# Patient Record
Sex: Female | Born: 1975
Health system: Southern US, Community
[De-identification: ages and names within clinical notes are randomized; demographics above are authoritative.]

## PROBLEM LIST (undated history)

## (undated) ENCOUNTER — Emergency Department (HOSPITAL_BASED_OUTPATIENT_CLINIC_OR_DEPARTMENT_OTHER): Admission: EM | Payer: 59 | Source: Home / Self Care

## (undated) DIAGNOSIS — I10 Essential (primary) hypertension: Secondary | ICD-10-CM

## (undated) DIAGNOSIS — D649 Anemia, unspecified: Secondary | ICD-10-CM

## (undated) DIAGNOSIS — K649 Unspecified hemorrhoids: Secondary | ICD-10-CM

## (undated) DIAGNOSIS — F419 Anxiety disorder, unspecified: Secondary | ICD-10-CM

## (undated) DIAGNOSIS — F32A Depression, unspecified: Secondary | ICD-10-CM

## (undated) DIAGNOSIS — Z5189 Encounter for other specified aftercare: Secondary | ICD-10-CM

## (undated) DIAGNOSIS — G43909 Migraine, unspecified, not intractable, without status migrainosus: Secondary | ICD-10-CM

## (undated) DIAGNOSIS — F329 Major depressive disorder, single episode, unspecified: Secondary | ICD-10-CM

## (undated) DIAGNOSIS — F319 Bipolar disorder, unspecified: Secondary | ICD-10-CM

## (undated) DIAGNOSIS — F1010 Alcohol abuse, uncomplicated: Secondary | ICD-10-CM

## (undated) HISTORY — DX: Major depressive disorder, single episode, unspecified: F32.9

## (undated) HISTORY — PX: HEMORRHOID SURGERY: SHX153

## (undated) HISTORY — DX: Depression, unspecified: F32.A

## (undated) HISTORY — DX: Anxiety disorder, unspecified: F41.9

## (undated) HISTORY — PX: VENTRAL HERNIA REPAIR: SHX424

## (undated) HISTORY — DX: Encounter for other specified aftercare: Z51.89

## (undated) HISTORY — PX: TONSILLECTOMY: SUR1361

## (undated) HISTORY — DX: Bipolar disorder, unspecified: F31.9

## (undated) HISTORY — PX: HERNIA REPAIR: SHX51

## (undated) HISTORY — DX: Anemia, unspecified: D64.9

---

## 1997-07-28 ENCOUNTER — Encounter: Admission: RE | Admit: 1997-07-28 | Discharge: 1997-07-28 | Payer: Self-pay | Admitting: Family Medicine

## 1997-08-11 ENCOUNTER — Encounter: Admission: RE | Admit: 1997-08-11 | Discharge: 1997-08-11 | Payer: Self-pay | Admitting: Family Medicine

## 1997-09-17 ENCOUNTER — Emergency Department (HOSPITAL_COMMUNITY): Admission: EM | Admit: 1997-09-17 | Discharge: 1997-09-17 | Payer: Self-pay | Admitting: Emergency Medicine

## 1998-04-08 ENCOUNTER — Emergency Department (HOSPITAL_COMMUNITY): Admission: EM | Admit: 1998-04-08 | Discharge: 1998-04-08 | Payer: Self-pay | Admitting: Emergency Medicine

## 1998-12-11 ENCOUNTER — Emergency Department (HOSPITAL_COMMUNITY): Admission: EM | Admit: 1998-12-11 | Discharge: 1998-12-11 | Payer: Self-pay | Admitting: Emergency Medicine

## 1999-07-26 ENCOUNTER — Other Ambulatory Visit: Admission: RE | Admit: 1999-07-26 | Discharge: 1999-07-26 | Payer: Self-pay | Admitting: Family Medicine

## 2000-09-26 ENCOUNTER — Other Ambulatory Visit: Admission: RE | Admit: 2000-09-26 | Discharge: 2000-09-26 | Payer: Self-pay | Admitting: Internal Medicine

## 2000-11-28 ENCOUNTER — Encounter: Payer: Self-pay | Admitting: *Deleted

## 2000-11-28 ENCOUNTER — Encounter: Admission: RE | Admit: 2000-11-28 | Discharge: 2000-11-28 | Payer: Self-pay | Admitting: *Deleted

## 2000-12-06 ENCOUNTER — Inpatient Hospital Stay (HOSPITAL_COMMUNITY): Admission: AD | Admit: 2000-12-06 | Discharge: 2000-12-06 | Payer: Self-pay | Admitting: Obstetrics

## 2001-03-04 ENCOUNTER — Encounter: Payer: Self-pay | Admitting: Internal Medicine

## 2001-03-04 ENCOUNTER — Ambulatory Visit (HOSPITAL_COMMUNITY): Admission: RE | Admit: 2001-03-04 | Discharge: 2001-03-04 | Payer: Self-pay | Admitting: Internal Medicine

## 2001-04-13 ENCOUNTER — Inpatient Hospital Stay (HOSPITAL_COMMUNITY): Admission: AD | Admit: 2001-04-13 | Discharge: 2001-04-13 | Payer: Self-pay

## 2001-06-09 ENCOUNTER — Ambulatory Visit (HOSPITAL_COMMUNITY): Admission: RE | Admit: 2001-06-09 | Discharge: 2001-06-09 | Payer: Self-pay

## 2001-07-02 ENCOUNTER — Ambulatory Visit (HOSPITAL_COMMUNITY): Admission: RE | Admit: 2001-07-02 | Discharge: 2001-07-02 | Payer: Self-pay

## 2001-07-15 ENCOUNTER — Ambulatory Visit (HOSPITAL_COMMUNITY): Admission: RE | Admit: 2001-07-15 | Discharge: 2001-07-15 | Payer: Self-pay

## 2001-09-06 ENCOUNTER — Inpatient Hospital Stay (HOSPITAL_COMMUNITY): Admission: AD | Admit: 2001-09-06 | Discharge: 2001-09-06 | Payer: Self-pay

## 2001-10-08 ENCOUNTER — Ambulatory Visit (HOSPITAL_COMMUNITY): Admission: RE | Admit: 2001-10-08 | Discharge: 2001-10-08 | Payer: Self-pay

## 2001-10-16 ENCOUNTER — Inpatient Hospital Stay (HOSPITAL_COMMUNITY): Admission: AD | Admit: 2001-10-16 | Discharge: 2001-10-16 | Payer: Self-pay

## 2001-10-16 ENCOUNTER — Inpatient Hospital Stay (HOSPITAL_COMMUNITY): Admission: AD | Admit: 2001-10-16 | Discharge: 2001-10-18 | Payer: Self-pay

## 2002-03-01 ENCOUNTER — Emergency Department (HOSPITAL_COMMUNITY): Admission: EM | Admit: 2002-03-01 | Discharge: 2002-03-01 | Payer: Self-pay | Admitting: Emergency Medicine

## 2002-03-01 ENCOUNTER — Encounter: Payer: Self-pay | Admitting: Emergency Medicine

## 2002-12-22 ENCOUNTER — Encounter: Payer: Self-pay | Admitting: Emergency Medicine

## 2002-12-22 ENCOUNTER — Emergency Department (HOSPITAL_COMMUNITY): Admission: EM | Admit: 2002-12-22 | Discharge: 2002-12-22 | Payer: Self-pay | Admitting: Emergency Medicine

## 2003-06-03 ENCOUNTER — Emergency Department (HOSPITAL_COMMUNITY): Admission: EM | Admit: 2003-06-03 | Discharge: 2003-06-04 | Payer: Self-pay | Admitting: Emergency Medicine

## 2003-06-16 ENCOUNTER — Encounter: Admission: RE | Admit: 2003-06-16 | Discharge: 2003-06-27 | Payer: Self-pay | Admitting: Occupational Medicine

## 2003-12-15 ENCOUNTER — Emergency Department (HOSPITAL_COMMUNITY): Admission: EM | Admit: 2003-12-15 | Discharge: 2003-12-15 | Payer: Self-pay | Admitting: Emergency Medicine

## 2005-01-18 ENCOUNTER — Inpatient Hospital Stay (HOSPITAL_COMMUNITY): Admission: AD | Admit: 2005-01-18 | Discharge: 2005-01-19 | Payer: Self-pay | Admitting: Obstetrics & Gynecology

## 2005-01-21 ENCOUNTER — Inpatient Hospital Stay (HOSPITAL_COMMUNITY): Admission: AD | Admit: 2005-01-21 | Discharge: 2005-01-21 | Payer: Self-pay | Admitting: Obstetrics and Gynecology

## 2005-05-27 ENCOUNTER — Other Ambulatory Visit: Admission: RE | Admit: 2005-05-27 | Discharge: 2005-05-27 | Payer: Self-pay | Admitting: Obstetrics and Gynecology

## 2005-10-14 ENCOUNTER — Observation Stay (HOSPITAL_COMMUNITY): Admission: AD | Admit: 2005-10-14 | Discharge: 2005-10-15 | Payer: Self-pay | Admitting: Obstetrics and Gynecology

## 2005-12-02 ENCOUNTER — Inpatient Hospital Stay (HOSPITAL_COMMUNITY): Admission: AD | Admit: 2005-12-02 | Discharge: 2005-12-13 | Payer: Self-pay | Admitting: Obstetrics and Gynecology

## 2007-11-17 ENCOUNTER — Emergency Department (HOSPITAL_COMMUNITY): Admission: EM | Admit: 2007-11-17 | Discharge: 2007-11-17 | Payer: Self-pay | Admitting: Emergency Medicine

## 2007-12-16 ENCOUNTER — Emergency Department (HOSPITAL_COMMUNITY): Admission: EM | Admit: 2007-12-16 | Discharge: 2007-12-16 | Payer: Self-pay | Admitting: Emergency Medicine

## 2008-02-01 ENCOUNTER — Ambulatory Visit: Payer: Self-pay | Admitting: Internal Medicine

## 2010-01-22 ENCOUNTER — Ambulatory Visit: Payer: Self-pay | Admitting: Obstetrics & Gynecology

## 2010-01-22 ENCOUNTER — Inpatient Hospital Stay (HOSPITAL_COMMUNITY): Admission: AD | Admit: 2010-01-22 | Discharge: 2010-01-22 | Payer: Self-pay | Admitting: Obstetrics & Gynecology

## 2010-02-19 ENCOUNTER — Inpatient Hospital Stay (HOSPITAL_COMMUNITY): Admission: EM | Admit: 2010-02-19 | Discharge: 2010-02-23 | Payer: Self-pay | Admitting: Emergency Medicine

## 2010-02-21 ENCOUNTER — Encounter (INDEPENDENT_AMBULATORY_CARE_PROVIDER_SITE_OTHER): Payer: Self-pay | Admitting: Internal Medicine

## 2010-07-11 LAB — CBC
HCT: 25.6 % — ABNORMAL LOW (ref 36.0–46.0)
HCT: 27.3 % — ABNORMAL LOW (ref 36.0–46.0)
HCT: 28.5 % — ABNORMAL LOW (ref 36.0–46.0)
Hemoglobin: 9.9 g/dL — ABNORMAL LOW (ref 12.0–15.0)
MCH: 22.7 pg — ABNORMAL LOW (ref 26.0–34.0)
MCH: 22.8 pg — ABNORMAL LOW (ref 26.0–34.0)
MCHC: 31.6 g/dL (ref 30.0–36.0)
MCV: 64.4 fL — ABNORMAL LOW (ref 78.0–100.0)
MCV: 66.8 fL — ABNORMAL LOW (ref 78.0–100.0)
MCV: 72.1 fL — ABNORMAL LOW (ref 78.0–100.0)
MCV: 72.2 fL — ABNORMAL LOW (ref 78.0–100.0)
Platelets: 219 10*3/uL (ref 150–400)
Platelets: 257 10*3/uL (ref 150–400)
RBC: 3.83 MIL/uL — ABNORMAL LOW (ref 3.87–5.11)
RBC: 3.96 MIL/uL (ref 3.87–5.11)
RBC: 4.37 MIL/uL (ref 3.87–5.11)
RDW: 17.7 % — ABNORMAL HIGH (ref 11.5–15.5)
RDW: 19 % — ABNORMAL HIGH (ref 11.5–15.5)
WBC: 5.7 10*3/uL (ref 4.0–10.5)
WBC: 6.1 10*3/uL (ref 4.0–10.5)
WBC: 7.1 10*3/uL (ref 4.0–10.5)
WBC: 8.4 10*3/uL (ref 4.0–10.5)

## 2010-07-11 LAB — BASIC METABOLIC PANEL
CO2: 24 mEq/L (ref 19–32)
CO2: 25 mEq/L (ref 19–32)
Calcium: 8.9 mg/dL (ref 8.4–10.5)
Chloride: 111 mEq/L (ref 96–112)
Chloride: 113 mEq/L — ABNORMAL HIGH (ref 96–112)
Creatinine, Ser: 0.78 mg/dL (ref 0.4–1.2)
GFR calc Af Amer: >60 mL/min (ref >60)
GFR calc Af Amer: >60 mL/min (ref >60)
GFR calc Af Amer: >60 mL/min (ref >60)
GFR calc non Af Amer: >60 mL/min (ref >60)
Glucose, Bld: 89 mg/dL (ref 70–99)
Potassium: 4.1 mEq/L (ref 3.5–5.1)
Potassium: 4.4 mEq/L (ref 3.5–5.1)
Sodium: 139 mEq/L (ref 135–145)
Sodium: 140 mEq/L (ref 135–145)

## 2010-07-11 LAB — DIFFERENTIAL
Basophils Absolute: 0.1 10*3/uL (ref 0.0–0.1)
Eosinophils Relative: 2 % (ref 0–5)
Lymphs Abs: 2.3 10*3/uL (ref 0.7–4.0)
Monocytes Relative: 8 % (ref 3–12)
Neutro Abs: 3.1 10*3/uL (ref 1.7–7.7)

## 2010-07-11 LAB — TYPE AND SCREEN
Antibody Screen: NEGATIVE
Unit division: 0

## 2010-07-11 LAB — POCT I-STAT, CHEM 8
BUN: 9 mg/dL (ref 6–23)
Chloride: 106 mEq/L (ref 96–112)
Creatinine, Ser: 0.9 mg/dL (ref 0.4–1.2)
Potassium: 4.2 mEq/L (ref 3.5–5.1)
Sodium: 140 mEq/L (ref 135–145)

## 2010-07-11 LAB — HEPATIC FUNCTION PANEL
Albumin: 3.2 g/dL — ABNORMAL LOW (ref 3.5–5.2)
Indirect Bilirubin: 0.3 mg/dL (ref 0.3–0.9)
Total Protein: 5.5 g/dL — ABNORMAL LOW (ref 6.0–8.3)

## 2010-07-12 LAB — POCT PREGNANCY, URINE: Preg Test, Ur: NEGATIVE

## 2010-07-12 LAB — GC/CHLAMYDIA PROBE AMP, GENITAL
Chlamydia, DNA Probe: NEGATIVE
GC Probe Amp, Genital: NEGATIVE

## 2010-07-12 LAB — URINE MICROSCOPIC-ADD ON

## 2010-07-12 LAB — WET PREP, GENITAL
Clue Cells Wet Prep HPF POC: NONE SEEN
Trich, Wet Prep: NONE SEEN

## 2010-07-12 LAB — CBC
Hemoglobin: 9.2 g/dL — ABNORMAL LOW (ref 12.0–15.0)
Platelets: 317 10*3/uL (ref 150–400)
RBC: 4.49 MIL/uL (ref 3.87–5.11)
WBC: 6.4 10*3/uL (ref 4.0–10.5)

## 2010-07-12 LAB — URINALYSIS, ROUTINE W REFLEX MICROSCOPIC
Bilirubin Urine: NEGATIVE
Glucose, UA: NEGATIVE mg/dL
Ketones, ur: NEGATIVE mg/dL
Nitrite: NEGATIVE
Specific Gravity, Urine: 1.01 (ref 1.005–1.030)
pH: 8.5 — ABNORMAL HIGH (ref 5.0–8.0)

## 2010-09-14 NOTE — H&P (Signed)
NAMETYIANA, HILL              ACCOUNT NO.:  [id number]   MEDICAL RECORD NO.:  [id number]           PATIENT TYPE:   LOCATION:                                FACILITY:  [facility]   PHYSICIAN:  Charles A. Delcambre, MDDATE OF BIRTH:  1976/03/24   DATE OF ADMISSION:  DATE OF DISCHARGE:                                HISTORY & PHYSICAL   DATE OF ADMISSION:  December 02, 2005   CHIEF COMPLAINT:  Gestational hypertension.   HISTORY OF PRESENT ILLNESS:  A 35 year old gravida 3, para 1-0-1-1 at 34  weeks 6 days now admitted for blood pressure monitoring.  Noted to have  blood pressures today in the office 146/106, repeat 148/100, with bedrest  for 1 week under Dr. Dawayne Patricia care.  PIH labs pending at this time.  PIH labs  negative on November 20, 2005.  Urinalysis today with trace proteinuria.  She  has had a headache off and on, even to the point that with known migraine  headaches she took a dose of Maxalt over the weekend.  She has scotomata  over the last day-and-a-half with generally speaking but not clear.  Denies  scotomata at this time upon further questioning.  She denies right upper  quadrant pain or current headache.   PAST MEDICAL HISTORY:  1. The patient is bipolar.  2. Migraine headaches.  3. Depression.  4. Chlamydia treated October 2006.   SURGICAL HISTORY:  1. Tonsillectomy, adenoidectomy.  2. Hernia repair.   OBSTETRICAL HISTORY:  SVD x1, SAB x1.   MEDICATIONS:  Flintstone vitamin, Tylenol p.r.n.   ALLERGIES:  No known drug allergies.   SOCIAL HISTORY:  Quit smoking early in the pregnancy.  No alcohol or illicit  drug use.   FAMILY HISTORY:  Mother with diabetes; otherwise, no major medical  illnesses.   REVIEW OF SYSTEMS:  Denies ruptured membranes or bleeding.  Occasional  contraction.   PHYSICAL EXAMINATION:  GENERAL:  Alert and oriented x3.  VITAL SIGNS:  Blood pressure 148/100 repeat, 146/106 originally.  HEENT:  Grossly within normal limits.  NECK:  Supple  without thyromegaly.  BREASTS:  Symmetrical.  LUNGS:  Clear bilaterally.  HEART:  Regular rate and rhythm with a 2/6 systolic ejection murmur left  sternal border.  ABDOMEN:  Grossly obese, making exam difficult to monitor.  Active fetal  movement was auscultated.  Biophysical was 8/10.  Active fetal movement was  noted on ultrasound.  Fetal heart rate was 135.  Amniotic fluid was 26.4.  Position was frank breech.  PELVIC:  Deferred.  EXTREMITIES:  Show 1-2+ deep tendon reflexes, no clonus present.   ASSESSMENT:  1. Gestational hypertension.  2. Intrauterine pregnancy 34 weeks 6 days estimated gestational age.   PLAN:  PIH labs are pending at this time here at the office.  Admission for  monitoring for blood pressure overnight.  If pressures remain in the severe  range, would proceed on with delivery.  Otherwise, obtain 24-hour urine and  hopefully have response with bedrest of the blood pressure.  She states she  has been on bedrest but states that when  she is up and about pressure goes  up; therefore, I do not feel she has been at strict bedrest at home.  We  will try bedrest in the hospital.  She is in agreement and will proceed to  the hospital as directed.  Will obtain a 24-hour urine at the hospital as  well.      Charles A. Sydnee Cabal, MD  Electronically Signed     CAD/MEDQ  D:  12/02/2005  T:  12/02/2005  Job:  [id number]

## 2011-01-25 LAB — POCT I-STAT, CHEM 8
Calcium, Ion: 1.22
Creatinine, Ser: 0.7
Glucose, Bld: 96
HCT: 40
Hemoglobin: 13.6
Potassium: 3.9

## 2011-09-24 ENCOUNTER — Other Ambulatory Visit: Payer: Self-pay

## 2011-09-24 ENCOUNTER — Emergency Department (HOSPITAL_COMMUNITY)
Admission: EM | Admit: 2011-09-24 | Discharge: 2011-09-24 | Disposition: A | Discharge status: Home / Self Care | Payer: Self-pay | Attending: Emergency Medicine | Admitting: Emergency Medicine

## 2011-09-24 ENCOUNTER — Emergency Department (HOSPITAL_COMMUNITY): Payer: Self-pay

## 2011-09-24 ENCOUNTER — Encounter (HOSPITAL_COMMUNITY): Payer: Self-pay | Admitting: *Deleted

## 2011-09-24 DIAGNOSIS — R5381 Other malaise: Secondary | ICD-10-CM | POA: Insufficient documentation

## 2011-09-24 DIAGNOSIS — R0602 Shortness of breath: Secondary | ICD-10-CM | POA: Insufficient documentation

## 2011-09-24 DIAGNOSIS — R079 Chest pain, unspecified: Secondary | ICD-10-CM | POA: Insufficient documentation

## 2011-09-24 DIAGNOSIS — R091 Pleurisy: Secondary | ICD-10-CM | POA: Insufficient documentation

## 2011-09-24 HISTORY — DX: Essential (primary) hypertension: I10

## 2011-09-24 HISTORY — DX: Unspecified hemorrhoids: K64.9

## 2011-09-24 LAB — CBC
HCT: 37.5 % (ref 36.0–46.0)
Hemoglobin: 12.5 g/dL (ref 12.0–15.0)
MCH: 27.2 pg (ref 26.0–34.0)
MCHC: 33.3 g/dL (ref 30.0–36.0)
RDW: 14.5 % (ref 11.5–15.5)

## 2011-09-24 LAB — DIFFERENTIAL
Basophils Absolute: 0 10*3/uL (ref 0.0–0.1)
Basophils Relative: 0 % (ref 0–1)
Eosinophils Absolute: 0.1 10*3/uL (ref 0.0–0.7)
Monocytes Absolute: 0.7 10*3/uL (ref 0.1–1.0)
Monocytes Relative: 11 % (ref 3–12)
Neutro Abs: 3.2 10*3/uL (ref 1.7–7.7)
Neutrophils Relative %: 51 % (ref 43–77)

## 2011-09-24 LAB — BASIC METABOLIC PANEL
BUN: 9 mg/dL (ref 6–23)
Creatinine, Ser: 0.79 mg/dL (ref 0.50–1.10)
GFR calc Af Amer: >90 mL/min (ref >90)
GFR calc non Af Amer: >90 mL/min (ref >90)

## 2011-09-24 LAB — D-DIMER, QUANTITATIVE: D-Dimer, Quant: 0.26 ug/mL-FEU (ref 0.00–0.48)

## 2011-09-24 MED ORDER — SODIUM CHLORIDE 0.9 % IV BOLUS (SEPSIS)
1000.0000 mL | Freq: Once | INTRAVENOUS | Status: AC
  Administered 2011-09-24: 1000 mL via INTRAVENOUS

## 2011-09-24 MED ORDER — IBUPROFEN 600 MG PO TABS: 600.0000 mg | ORAL_TABLET | Freq: Four times a day (QID) | ORAL | Status: AC

## 2011-09-24 MED ORDER — KETOROLAC TROMETHAMINE 30 MG/ML IJ SOLN
30.0000 mg | Freq: Once | INTRAMUSCULAR | Status: AC
  Administered 2011-09-24: 30 mg via INTRAVENOUS
  Filled 2011-09-24: qty 1

## 2011-09-24 NOTE — Progress Notes (Signed)
Pt listed as self pay with no insurance coverage Pt confirms she is self pay [place] resident.  CM and [facility] community liaison spoke with her Pt offered [facility] services to assist with finding a [place] self pay provider  States she is not interested in services

## 2011-09-24 NOTE — ED Notes (Signed)
md at bedside  Pt alert and oriented x4. Respirations even and unlabored, bilateral symmetrical rise and fall of chest. Skin warm and dry. In no acute distress. Denies needs.   

## 2011-09-24 NOTE — ED Provider Notes (Signed)
History     CSN: [id number]  Arrival date & time 09/24/11  4098   First MD Initiated Contact with Patient 09/24/11 [phone]      Chief Complaint  Patient presents with  . Chest Pain    (Consider location/radiation/quality/duration/timing/severity/associated sxs/prior treatment) HPI Pt p/w central, substernal chest pain worse with deep breathing and movement x 1 month. Pain is constant. No fever, chills, cough. Mild SOB and fatigue. No lower ext swelling or pain. Pt describes pain as "pulling." Pain improved when lying flat Past Medical History  Diagnosis Date  . Hypertension     gestational  . Hemorrhoid     Past Surgical History  Procedure Date  . Ventral hernia repair   . Tonsillectomy   . Hemorrhoid surgery     Oct 2011    History reviewed. No pertinent family history.  History  Substance Use Topics  . Smoking status: Former Smoker -- 10 years    Types: Cigarettes  . Smokeless tobacco: Not on file  . Alcohol Use: No    OB History    Grav Para Term Preterm Abortions TAB SAB Ect Mult Living                  Review of Systems  Constitutional: Positive for fatigue. Negative for fever and chills.  Respiratory: Positive for shortness of breath. Negative for cough and wheezing.   Cardiovascular: Positive for chest pain. Negative for palpitations and leg swelling.  Gastrointestinal: Negative for nausea, vomiting and abdominal pain.  Musculoskeletal: Negative for back pain.  Skin: Negative for rash and wound.  Neurological: Positive for light-headedness. Negative for weakness and numbness.    Allergies  Review of patient's allergies indicates no known allergies.  Home Medications   Current Outpatient Rx  Name Route Sig Dispense Refill  . FERROUS SULFATE 325 (65 FE) MG PO TABS Oral Take 325 mg by mouth daily with breakfast.    . IBUPROFEN 200 MG PO TABS Oral Take 200 mg by mouth every 6 (six) hours as needed. Pain    . IBUPROFEN 600 MG PO TABS Oral Take 1  tablet (600 mg total) by mouth every 6 (six) hours. 30 tablet 0    BP 128/90  Pulse 84  Temp(Src) 98 F (36.7 C) (Oral)  Resp 18  Ht 5' (1.524 m)  Wt 211 lb (95.709 kg)  BMI 41.21 kg/m2  SpO2 100%  LMP 09/03/2011  Physical Exam  Nursing note and vitals reviewed. Constitutional: She is oriented to person, place, and time. She appears well-developed and well-nourished. No distress.  HENT:  Head: Normocephalic and atraumatic.  Mouth/Throat: Oropharynx is clear and moist. No oropharyngeal exudate.  Eyes: EOM are normal. Pupils are equal, round, and reactive to light.  Neck: Normal range of motion. Neck supple.  Cardiovascular: Normal rate and regular rhythm.  Exam reveals no friction rub.   No murmur heard. Pulmonary/Chest: Effort normal and breath sounds normal. No respiratory distress. She has no wheezes. She has no rales. She exhibits no tenderness (No chest wall tenderness).  Abdominal: Soft. Bowel sounds are normal. There is no tenderness. There is no rebound and no guarding.  Musculoskeletal: Normal range of motion. She exhibits no edema and no tenderness.       No calf swelling or tenderness  Neurological: She is alert and oriented to person, place, and time.       5/5 motor, sensation grossly intact  Skin: Skin is warm and dry. No rash noted. No erythema.  Psychiatric: She has a normal mood and affect. Her behavior is normal.    ED Course  Procedures (including critical care time)  Labs Reviewed  BASIC METABOLIC PANEL - Abnormal; Notable for the following:    Glucose, Bld 102 (*)    All other components within normal limits  CBC  DIFFERENTIAL  D-DIMER, QUANTITATIVE  HCG, SERUM, QUALITATIVE  POCT I-STAT TROPONIN I   Dg Chest 2 View  09/24/2011  *RADIOLOGY REPORT*  Clinical Data:  Left-sided chest pain.  CHEST - 2 VIEW  Comparison: None  Findings: The heart size and mediastinal contours are within normal limits.  Both lungs are clear.  The visualized skeletal  structures are unremarkable.  IMPRESSION: No active disease.  Original Report Authenticated By: Reola Calkins, M.D.     1. Pleurisy      Date: 09/24/2011  Rate: 86  Rhythm: normal sinus rhythm  QRS Axis: normal  Intervals: normal  ST/T Wave abnormalities: normal  Conduction Disutrbances:none  Narrative Interpretation:   Old EKG Reviewed: none available    MDM  CAD very unlikely given presentation. Likely chest wall pain vs pleurisy.         Loren Racer, MD 09/24/11 1055

## 2011-09-24 NOTE — Discharge Instructions (Signed)
Pleurisy  Pleurisy is an inflammation and swelling of the lining of the lungs. It usually is the result of an underlying infection or other disease. Because of this inflammation, it hurts to breathe. It is aggravated by coughing or deep breathing. The primary goal in treating pleurisy is to diagnose and treat the condition that caused it.   HOME CARE INSTRUCTIONS    Only take over-the-counter or prescription medicines for pain, discomfort, or fever as directed by your caregiver.   If medications which kill germs (antibiotics) were prescribed, take the entire course. Even if you are feeling better, you need to take them.   Use a cool mist vaporizer to help loosen secretions. This is so the secretions can be coughed up more easily.  SEEK MEDICAL CARE IF:    Your pain is not controlled with medication or is increasing.   You have an increase inpus like (purulent) secretions brought up with coughing.  SEEK IMMEDIATE MEDICAL CARE IF:    You have blue or dark lips, fingernails, or toenails.   You begin coughing up blood.   You have increased difficulty breathing.   You have continuing pain unrelieved by medicine or lasting more than 1 week.   You have pain that radiates into your neck, arms, or jaw.   You develop increased shortness of breath or wheezing.   You develop a fever, rash, vomiting, fainting, or other serious complaints.  Document Released: 04/15/2005 Document Revised: 04/04/2011 Document Reviewed: 11/14/2006  ExitCare Patient Information 2012 [employer].

## 2011-09-24 NOTE — ED Notes (Addendum)
Pt comes into ED with c/o of chest pain x1 month. Denies any trauma. Reports centralized chest pain 5/10, pain has gotten increasingly worse over time. Now pain during any kind of movement, only relief when pt is lying completely flat. Pt states "feels like there is an elephant on my chest". Reports pain increases when she breathes in. Hx of smoking x 10 years 0.5 packs/ day, and gestational diabetes. Family hx of heart attacks and heart disease.   In Oct 2011 pt had hemorrhoid surgery for rectal bleeding and had to have a transfusion. At present pt is having slight rectal bleeding. md aware.

## 2012-07-04 ENCOUNTER — Emergency Department (HOSPITAL_COMMUNITY): Admission: EM | Admit: 2012-07-04 | Discharge: 2012-07-04 | Discharge status: Against Medical Advice | Payer: Self-pay

## 2012-07-04 ENCOUNTER — Emergency Department (HOSPITAL_COMMUNITY)
Admission: EM | Admit: 2012-07-04 | Discharge: 2012-07-04 | Disposition: A | Discharge status: Home / Self Care | Payer: Self-pay | Attending: Emergency Medicine | Admitting: Emergency Medicine

## 2012-07-04 ENCOUNTER — Encounter (HOSPITAL_COMMUNITY): Payer: Self-pay | Admitting: Emergency Medicine

## 2012-07-04 ENCOUNTER — Emergency Department (HOSPITAL_COMMUNITY): Payer: Self-pay

## 2012-07-04 DIAGNOSIS — I10 Essential (primary) hypertension: Secondary | ICD-10-CM | POA: Insufficient documentation

## 2012-07-04 DIAGNOSIS — F3289 Other specified depressive episodes: Secondary | ICD-10-CM | POA: Insufficient documentation

## 2012-07-04 DIAGNOSIS — Z8679 Personal history of other diseases of the circulatory system: Secondary | ICD-10-CM | POA: Insufficient documentation

## 2012-07-04 DIAGNOSIS — R42 Dizziness and giddiness: Secondary | ICD-10-CM | POA: Insufficient documentation

## 2012-07-04 DIAGNOSIS — R519 Headache, unspecified: Secondary | ICD-10-CM

## 2012-07-04 DIAGNOSIS — H53149 Visual discomfort, unspecified: Secondary | ICD-10-CM | POA: Insufficient documentation

## 2012-07-04 DIAGNOSIS — R635 Abnormal weight gain: Secondary | ICD-10-CM | POA: Insufficient documentation

## 2012-07-04 DIAGNOSIS — F32A Depression, unspecified: Secondary | ICD-10-CM

## 2012-07-04 DIAGNOSIS — R51 Headache: Secondary | ICD-10-CM | POA: Insufficient documentation

## 2012-07-04 DIAGNOSIS — Z87891 Personal history of nicotine dependence: Secondary | ICD-10-CM | POA: Insufficient documentation

## 2012-07-04 DIAGNOSIS — Z3202 Encounter for pregnancy test, result negative: Secondary | ICD-10-CM | POA: Insufficient documentation

## 2012-07-04 DIAGNOSIS — G4700 Insomnia, unspecified: Secondary | ICD-10-CM

## 2012-07-04 DIAGNOSIS — R5381 Other malaise: Secondary | ICD-10-CM | POA: Insufficient documentation

## 2012-07-04 DIAGNOSIS — H538 Other visual disturbances: Secondary | ICD-10-CM | POA: Insufficient documentation

## 2012-07-04 LAB — URINALYSIS, ROUTINE W REFLEX MICROSCOPIC
Bilirubin Urine: NEGATIVE
Ketones, ur: NEGATIVE mg/dL
Nitrite: NEGATIVE
pH: 6 (ref 5.0–8.0)

## 2012-07-04 LAB — POCT I-STAT, CHEM 8
Calcium, Ion: 1.21 mmol/L (ref 1.12–1.23)
Chloride: 108 mEq/L (ref 96–112)
Creatinine, Ser: 0.7 mg/dL (ref 0.50–1.10)
Glucose, Bld: 91 mg/dL (ref 70–99)
HCT: 37 % (ref 36.0–46.0)

## 2012-07-04 MED ORDER — DIPHENHYDRAMINE HCL 50 MG/ML IJ SOLN
25.0000 mg | Freq: Once | INTRAMUSCULAR | Status: AC
  Administered 2012-07-04: 25 mg via INTRAVENOUS
  Filled 2012-07-04: qty 1

## 2012-07-04 MED ORDER — ZOLPIDEM TARTRATE ER 6.25 MG PO TBCR: 6.2500 mg | EXTENDED_RELEASE_TABLET | Freq: Every evening | ORAL | Status: DC | PRN

## 2012-07-04 MED ORDER — METOCLOPRAMIDE HCL 5 MG/ML IJ SOLN
10.0000 mg | Freq: Once | INTRAMUSCULAR | Status: AC
  Administered 2012-07-04: 10 mg via INTRAVENOUS
  Filled 2012-07-04: qty 2

## 2012-07-04 MED ORDER — DIAZEPAM 5 MG/ML IJ SOLN
5.0000 mg | Freq: Once | INTRAMUSCULAR | Status: AC
  Administered 2012-07-04: 5 mg via INTRAVENOUS
  Filled 2012-07-04: qty 2

## 2012-07-04 MED ORDER — KETOROLAC TROMETHAMINE 30 MG/ML IJ SOLN
30.0000 mg | Freq: Once | INTRAMUSCULAR | Status: AC
  Administered 2012-07-04: 30 mg via INTRAVENOUS
  Filled 2012-07-04: qty 1

## 2012-07-04 MED ORDER — NAPROXEN 500 MG PO TABS: 500.0000 mg | ORAL_TABLET | Freq: Two times a day (BID) | ORAL | Status: DC

## 2012-07-04 NOTE — ED Notes (Signed)
Per patient, states headache for 3 weeks, unable to sleep-tired all the time-30 lb weight gain-depressed problems at home

## 2012-07-04 NOTE — ED Provider Notes (Signed)
History     CSN: [id number]  Arrival date & time 07/04/12  1011   First MD Initiated Contact with Patient 07/04/12 1138      Chief Complaint  Patient presents with  . headache/insomnia     (Consider location/radiation/quality/duration/timing/severity/associated sxs/prior treatment) HPI Comments: Marilyn Fowler is a 37 y.o. female with a history of hypertension presents emergency department with multiple complaints.  Patient states onset of symptoms began approximately one month ago and describes headaches, insomnia, depression and weight gain.  Headaches are described as frontal in location with constant dull ache and intermittent throbbing.  Associated symptoms include photophobia, blurred vision, and dizziness.  Patient reports sleeping approximately 2-3 hours a night, increased stress levels and appetite.  She reports a family history of both migraines and depression believes her symptoms to be stemming from depression of which she has never had before in the past.  Patient denies any suicidal or homicidal ideations, self injury or past suicide attempts.  Other associated symptoms include fatigue and ruminating thoughts process. Patient did not have a PCP.  The history is provided by the patient.    Past Medical History  Diagnosis Date  . Hypertension     gestational  . Hemorrhoid     Past Surgical History  Procedure Laterality Date  . Ventral hernia repair    . Tonsillectomy    . Hemorrhoid surgery      Oct 2011    No family history on file.  History  Substance Use Topics  . Smoking status: Former Smoker -- 10 years    Types: Cigarettes  . Smokeless tobacco: Not on file  . Alcohol Use: No    OB History   Grav Para Term Preterm Abortions TAB SAB Ect Mult Living                  Review of Systems  All other systems reviewed and are negative.    Allergies  Review of patient's allergies indicates no known allergies.  Home Medications   Current Outpatient  Rx  Name  Route  Sig  Dispense  Refill  . acetaminophen (TYLENOL) 325 MG tablet   Oral   Take 325 mg by mouth every 6 (six) hours as needed for pain. Pain         . ibuprofen (ADVIL,MOTRIN) 200 MG tablet   Oral   Take 200 mg by mouth every 6 (six) hours as needed. Pain           BP 129/82  Pulse 80  Temp(Src) 98 F (36.7 C) (Oral)  Resp 16  SpO2 100%  LMP 06/26/2012  Physical Exam  Nursing note and vitals reviewed. Constitutional: She is oriented to person, place, and time. She appears well-developed and well-nourished. No distress.  HENT:  Head: Normocephalic and atraumatic.  Eyes: Conjunctivae and EOM are normal. Pupils are equal, round, and reactive to light. No scleral icterus.  Neck: Normal range of motion.  Neck supple with no nuchal rigidity, full pain free ROM. No carotid bruit  Cardiovascular: Normal rate, regular rhythm, normal heart sounds and intact distal pulses.   Pulmonary/Chest: Effort normal and breath sounds normal. No respiratory distress.  Musculoskeletal: Normal range of motion.  Neurological: She is alert and oriented to person, place, and time. She has normal strength.  CN III-XII intact, good coordination, normal gait, strength 5/5 bilaterally, intact distal sensation.  Skin: Skin is warm and dry.  No rash, non diaphoretic    ED Course  Procedures (including critical care time)  Labs Reviewed  URINALYSIS, ROUTINE W REFLEX MICROSCOPIC - Abnormal; Notable for the following:    APPearance CLOUDY (*)    All other components within normal limits  POCT I-STAT, CHEM 8  POCT PREGNANCY, URINE   Ct Head Wo Contrast  07/04/2012  *RADIOLOGY REPORT*  Clinical Data:  Headache 3 weeks  CT HEAD WITHOUT CONTRAST  Technique:  Contiguous axial images were obtained from the base of the skull through the vertex without contrast  Comparison:  None.  Findings:  The brain has a normal appearance without evidence for hemorrhage, acute infarction, hydrocephalus, or  mass lesion.  There is no extra axial fluid collection.  The skull and paranasal sinuses are normal.  IMPRESSION: Normal CT of the head without contrast.   Original Report Authenticated By: Janeece Riggers, M.D.      No diagnosis found.    MDM  HA, Depression, Insomnia  Pt HA treated and improved while in ED.  Presentation is like pts typical HA and non concerning for [facility], ICH, Meningitis, or temporal arteritis. Pt is afebrile with no focal neuro deficits, nuchal rigidity, or change in vision. In addition pt presented with depression and insomnia- In depth discussion and pt denies all SI or HI and has no hx of self injury, past attempts, substance abuse or psychosis. Suicide risk estimation is relatively low. Will dc w BH follow up and resource guide. We have discussed that If the patient feels he was becoming unsafe, instead of acting on an impulse of self harm he will contact the crisis line, speak to her family about it, or return to the emergency department. Pt verbalizes understanding and is agreeable with plan to dc.          Jaci Carrel, [place] 07/04/12 [phone]

## 2012-07-04 NOTE — ED Notes (Signed)
Patient stated she just used the bathroom and was unable to go at this time gave patient a cup of water per PA

## 2012-07-04 NOTE — ED Provider Notes (Signed)
Medical screening examination/treatment/procedure(s) were performed by non-physician practitioner and as supervising physician I was immediately available for consultation/collaboration.   Loren Racer, MD 07/04/12 [phone]

## 2012-07-10 ENCOUNTER — Emergency Department (HOSPITAL_COMMUNITY)
Admission: EM | Admit: 2012-07-10 | Discharge: 2012-07-11 | Disposition: A | Discharge status: Other Acute Inpatient Hospital | Payer: Self-pay | Attending: Emergency Medicine | Admitting: Emergency Medicine

## 2012-07-10 ENCOUNTER — Encounter (HOSPITAL_COMMUNITY): Payer: Self-pay | Admitting: Emergency Medicine

## 2012-07-10 DIAGNOSIS — G43909 Migraine, unspecified, not intractable, without status migrainosus: Secondary | ICD-10-CM | POA: Insufficient documentation

## 2012-07-10 DIAGNOSIS — F329 Major depressive disorder, single episode, unspecified: Secondary | ICD-10-CM | POA: Insufficient documentation

## 2012-07-10 DIAGNOSIS — Z3202 Encounter for pregnancy test, result negative: Secondary | ICD-10-CM | POA: Insufficient documentation

## 2012-07-10 DIAGNOSIS — Z87891 Personal history of nicotine dependence: Secondary | ICD-10-CM | POA: Insufficient documentation

## 2012-07-10 DIAGNOSIS — F3289 Other specified depressive episodes: Secondary | ICD-10-CM | POA: Insufficient documentation

## 2012-07-10 DIAGNOSIS — F604 Histrionic personality disorder: Secondary | ICD-10-CM | POA: Insufficient documentation

## 2012-07-10 HISTORY — DX: Migraine, unspecified, not intractable, without status migrainosus: G43.909

## 2012-07-10 LAB — COMPREHENSIVE METABOLIC PANEL
BUN: 12 mg/dL (ref 6–23)
CO2: 27 mEq/L (ref 19–32)
Chloride: 102 mEq/L (ref 96–112)
Creatinine, Ser: 0.78 mg/dL (ref 0.50–1.10)
GFR calc non Af Amer: >90 mL/min (ref >90)
Glucose, Bld: 86 mg/dL (ref 70–99)
Total Bilirubin: 0.3 mg/dL (ref 0.3–1.2)

## 2012-07-10 LAB — RAPID URINE DRUG SCREEN, HOSP PERFORMED
Opiates: NOT DETECTED
Tetrahydrocannabinol: NOT DETECTED

## 2012-07-10 LAB — CBC
HCT: 36.9 % (ref 36.0–46.0)
MCV: 81.5 fL (ref 78.0–100.0)
RBC: 4.53 MIL/uL (ref 3.87–5.11)
WBC: 7.6 10*3/uL (ref 4.0–10.5)

## 2012-07-10 LAB — ETHANOL: Alcohol, Ethyl (B): <11 mg/dL (ref 0–11)

## 2012-07-10 MED ORDER — TRAMADOL HCL 50 MG PO TABS
50.0000 mg | ORAL_TABLET | Freq: Once | ORAL | Status: AC
  Administered 2012-07-10: 50 mg via ORAL
  Filled 2012-07-10: qty 1

## 2012-07-10 MED ORDER — ALUM & MAG HYDROXIDE-SIMETH 200-200-20 MG/5ML PO SUSP: 30.0000 mL | ORAL | Status: DC | PRN

## 2012-07-10 MED ORDER — ONDANSETRON HCL 4 MG PO TABS: 4.0000 mg | ORAL_TABLET | Freq: Three times a day (TID) | ORAL | Status: DC | PRN

## 2012-07-10 MED ORDER — IBUPROFEN 600 MG PO TABS
600.0000 mg | ORAL_TABLET | Freq: Three times a day (TID) | ORAL | Status: DC | PRN
  Administered 2012-07-10 – 2012-07-11 (×2): 600 mg via ORAL
  Filled 2012-07-10 (×2): qty 1

## 2012-07-10 MED ORDER — LORAZEPAM 1 MG PO TABS
1.0000 mg | ORAL_TABLET | Freq: Three times a day (TID) | ORAL | Status: DC | PRN
  Administered 2012-07-10 – 2012-07-11 (×3): 1 mg via ORAL
  Filled 2012-07-10 (×3): qty 1

## 2012-07-10 MED ORDER — ACETAMINOPHEN 325 MG PO TABS: 650.0000 mg | ORAL_TABLET | ORAL | Status: DC | PRN

## 2012-07-10 MED ORDER — ZOLPIDEM TARTRATE 5 MG PO TABS
5.0000 mg | ORAL_TABLET | Freq: Every evening | ORAL | Status: DC | PRN
  Administered 2012-07-10: 5 mg via ORAL
  Filled 2012-07-10: qty 1

## 2012-07-10 MED ORDER — METOCLOPRAMIDE HCL 10 MG PO TABS
10.0000 mg | ORAL_TABLET | Freq: Once | ORAL | Status: AC
  Administered 2012-07-10: 10 mg via ORAL
  Filled 2012-07-10: qty 1

## 2012-07-10 NOTE — ED Notes (Signed)
Pt mother took pt belongings to car

## 2012-07-10 NOTE — Progress Notes (Signed)
During [facility] ED 07/10/12 visit CM spoke with pt who confirms self pay [place] resident with no pcp. CM discussed and provided written information for self pay pcps, importance of pcp for f/u care, www.needymeds.org, discounted pharmacies, MATCH program and other [place] resources such as financial assistance, DSS and  health department Reviewed Health connect number to assist with finding self pay provider close to pt's residence. Reviewed resources for [place] self pay pcps like [facility], [facility] [address] [facility], [facility], general medical clinics, [facility] plus others, [facility] out patient [facility], housing, affordable care act/health reform (deadline 07/27/12) and other resources in [place]. Pt voiced understanding and appreciation of resources provided   

## 2012-07-10 NOTE — ED Notes (Addendum)
I took the patient to the bathroom and asisted her with her clothing. I gave her belongings to her mother.I put the patient in blue scrubs

## 2012-07-10 NOTE — ED Notes (Signed)
Pt presenting to ed with c/o headache pain x 6 weeks and racing thoughts x several months and they are getting worse pt states she's having thoughts that her children will be better without her. Pt states she is depressed, she's gained weight and she's very irritable. Pt states she's extremely stressed and all of this is making her head hurt

## 2012-07-10 NOTE — ED Provider Notes (Signed)
Medical screening examination/treatment/procedure(s) were performed by non-physician practitioner and as supervising physician I was immediately available for consultation/collaboration.   David H Yao, MD 07/10/12 1541 

## 2012-07-10 NOTE — Progress Notes (Signed)
  Pt agreed to receive these written resources upon discharge along with her d/c instructions. BH RN informed and CM requested pt be given written information at d/c

## 2012-07-10 NOTE — ED Notes (Signed)
Patient talking w/nurse informing her of headache and stress issues. Offered Ativan.

## 2012-07-10 NOTE — ED Provider Notes (Signed)
History     CSN: [id number]  Arrival date & time 07/10/12  1207   First MD Initiated Contact with Patient 07/10/12 1216      Chief Complaint  Patient presents with  . Medical Clearance    (Consider location/radiation/quality/duration/timing/severity/associated sxs/prior treatment) HPI  37 year old female with history of migraine presents for evaluations of suicidal ideation. Patient reports for the past 6 weeks she has had constant pounding headache. Headache feels similar to prior headaches she has in the past. The primary concern is having racing thoughts, inability to concentrate, feeling depressed, having thoughts of bashing her head against the wall or using a sledgehammer to hit her head without actual attempt. She feels as if the children's a better off without her. She is gaining weight, is very irritable, and cannot bear been around groups of people. She reports a strong family history of bipolar and schizophrenia although she was never diagnosed with it. She has been self-medicating by drinking a bottle of wine to help with sleep. Last drink was 3 days ago. She denies any other recreational drug use. She denies homicidal ideations or having visual or auditory hallucination. She used to be treated for depression with antidepressant medication many years ago, states it did not work and therefore she has stopped using it for many years. She was seen in the ER for the same complaint 5 days ago. She had head CT, workup with labs, and was treated for migraine. Her migraine resolved for 24 hours but has resumed again. Denies fever, chills, chest pain, short of breath, abdominal pain, nausea, vomiting, diarrhea, rash.  Past Medical History  Diagnosis Date  . Migraine     Past Surgical History  Procedure Laterality Date  . Tonsillectomy    . Hernia repair    . Hemorrhoid surgery      No family history on file.  History  Substance Use Topics  . Smoking status: Former Games developer  .  Smokeless tobacco: Not on file  . Alcohol Use: Yes     Comment: socially    OB History   Grav Para Term Preterm Abortions TAB SAB Ect Mult Living                  Review of Systems  Constitutional:       10 Systems reviewed and all are negative for acute change except as noted in the HPI.     Allergies  Review of patient's allergies indicates not on file.  Home Medications  No current outpatient prescriptions on file.  BP 144/94  Pulse 92  Temp(Src) 98.4 F (36.9 C) (Oral)  Resp 20  SpO2 99%  LMP 06/23/2012  Physical Exam  Nursing note and vitals reviewed. Constitutional: She appears well-developed and well-nourished. No distress.  Awake, alert, nontoxic appearance.  Morbidly obese  HENT:  Head: Atraumatic.  Mouth/Throat: Oropharynx is clear and moist.  Eyes: Conjunctivae are normal. Right eye exhibits no discharge. Left eye exhibits no discharge.  Neck: Neck supple.  No nuchal rigidity, no meningismal sign.  Cardiovascular: Normal rate and regular rhythm.   Pulmonary/Chest: Effort normal. No respiratory distress. She exhibits no tenderness.  Abdominal: Soft. There is no tenderness. There is no rebound.  Musculoskeletal: She exhibits no tenderness.  ROM appears intact, no obvious focal weakness  Neurological: She has normal strength. No cranial nerve deficit or sensory deficit. She displays a negative Romberg sign. Coordination and gait normal. GCS eye subscore is 4. GCS verbal subscore is 5. GCS motor  subscore is 6.  Mental status and motor strength appears intact  Skin: No rash noted.  Psychiatric: She has a normal mood and affect. Her speech is normal and behavior is normal. Judgment and thought content normal. Cognition and memory are normal.    ED Course  Procedures (including critical care time)  12:34 PM Patient was seen and evaluated by me for her headache and her request for psychiatric evaluation. Headaches appears to be chronic in nature. No red  flags. She is complaining of having thoughts of smashing her head, but i do not think it's actual suicidal ideation.  Med Clearance initiated.  I have consulted with [facility] who agrees to evaluate pt.  Will perform psych hold and med rec.  Pt currently stable.    Labs Reviewed  URINE RAPID DRUG SCREEN (HOSP PERFORMED) - Abnormal; Notable for the following:    Benzodiazepines POSITIVE (*)    All other components within normal limits  CBC  COMPREHENSIVE METABOLIC PANEL  ETHANOL  POCT PREGNANCY, URINE   No results found.   1. Migraine   2. Depression   3. Labile personality    BP 144/94  Pulse 92  Temp(Src) 98.4 F (36.9 C) (Oral)  Resp 20  SpO2 99%  LMP 06/23/2012    MDM          Fayrene Helper, PA-C 07/10/12 1506

## 2012-07-10 NOTE — ED Notes (Signed)
Patient c/p headache and needing something forit, called EDP requested this nurse given Motrin 600 po

## 2012-07-10 NOTE — ED Notes (Addendum)
Patient had her jelry on it was removed and put in a bag with two mrn tags on it and put in locker 37 her family took rest of belongings home all she has is her Roselyn Meier
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
## 2012-07-11 ENCOUNTER — Inpatient Hospital Stay (HOSPITAL_COMMUNITY)
Admission: AD | Admit: 2012-07-11 | Discharge: 2012-07-21 | DRG: 885 | Disposition: A | Discharge status: Home / Self Care | Payer: Federal, State, Local not specified - Other | Source: Ambulatory Visit | Attending: Psychiatry | Admitting: Psychiatry

## 2012-07-11 ENCOUNTER — Encounter (HOSPITAL_COMMUNITY): Payer: Self-pay

## 2012-07-11 DIAGNOSIS — Z79899 Other long term (current) drug therapy: Secondary | ICD-10-CM

## 2012-07-11 DIAGNOSIS — I10 Essential (primary) hypertension: Secondary | ICD-10-CM | POA: Diagnosis present

## 2012-07-11 DIAGNOSIS — F411 Generalized anxiety disorder: Secondary | ICD-10-CM | POA: Diagnosis present

## 2012-07-11 DIAGNOSIS — F3189 Other bipolar disorder: Principal | ICD-10-CM | POA: Diagnosis present

## 2012-07-11 DIAGNOSIS — G43909 Migraine, unspecified, not intractable, without status migrainosus: Secondary | ICD-10-CM | POA: Diagnosis present

## 2012-07-11 DIAGNOSIS — F332 Major depressive disorder, recurrent severe without psychotic features: Secondary | ICD-10-CM | POA: Diagnosis present

## 2012-07-11 DIAGNOSIS — F3181 Bipolar II disorder: Secondary | ICD-10-CM | POA: Diagnosis present

## 2012-07-11 MED ORDER — ARIPIPRAZOLE 5 MG PO TABS
5.0000 mg | ORAL_TABLET | Freq: Every day | ORAL | Status: DC
  Administered 2012-07-11 – 2012-07-16 (×6): 5 mg via ORAL
  Filled 2012-07-11 (×9): qty 1

## 2012-07-11 MED ORDER — ALUM & MAG HYDROXIDE-SIMETH 200-200-20 MG/5ML PO SUSP: 30.0000 mL | ORAL | Status: DC | PRN

## 2012-07-11 MED ORDER — INFLUENZA VIRUS VACC SPLIT PF IM SUSP
0.5000 mL | INTRAMUSCULAR | Status: AC
  Administered 2012-07-12: 0.5 mL via INTRAMUSCULAR

## 2012-07-11 MED ORDER — ZOLPIDEM TARTRATE 5 MG PO TABS
5.0000 mg | ORAL_TABLET | Freq: Every day | ORAL | Status: DC
  Administered 2012-07-11: 5 mg via ORAL
  Filled 2012-07-11: qty 1

## 2012-07-11 MED ORDER — ACETAMINOPHEN 325 MG PO TABS
650.0000 mg | ORAL_TABLET | Freq: Four times a day (QID) | ORAL | Status: DC | PRN
  Administered 2012-07-12 (×2): 650 mg via ORAL

## 2012-07-11 MED ORDER — ZOLPIDEM TARTRATE 5 MG PO TABS
5.0000 mg | ORAL_TABLET | Freq: Every evening | ORAL | Status: DC | PRN
  Administered 2012-07-11 – 2012-07-19 (×13): 5 mg via ORAL
  Filled 2012-07-11 (×11): qty 1

## 2012-07-11 MED ORDER — ZOLPIDEM TARTRATE 5 MG PO TABS
ORAL_TABLET | ORAL | Status: AC
  Filled 2012-07-11: qty 1

## 2012-07-11 MED ORDER — TOPIRAMATE 25 MG PO TABS
25.0000 mg | ORAL_TABLET | Freq: Every day | ORAL | Status: DC
  Administered 2012-07-11 – 2012-07-20 (×10): 25 mg via ORAL
  Filled 2012-07-11 (×13): qty 1

## 2012-07-11 MED ORDER — HYDROXYZINE HCL 25 MG PO TABS
25.0000 mg | ORAL_TABLET | Freq: Four times a day (QID) | ORAL | Status: DC | PRN
  Administered 2012-07-11 – 2012-07-21 (×27): 25 mg via ORAL
  Filled 2012-07-11: qty 20

## 2012-07-11 MED ORDER — MAGNESIUM HYDROXIDE 400 MG/5ML PO SUSP: 30.0000 mL | Freq: Every day | ORAL | Status: DC | PRN

## 2012-07-11 NOTE — ED Notes (Signed)
Report called to Maureen Chatters, RN at [facility]. Dr.Arfeen has accepted to room 507-1. Will be transported with security and a mental health tech.

## 2012-07-11 NOTE — ED Provider Notes (Addendum)
Marilyn Fowler is a 37 y.o. female was being evaluated for suicidal ideation. She has not been seen yet, by Tele-psychiatry. A consultation is ordered.  Patient's cooperative This morning. She still admits to suicidal ideation.    Flint Melter, MD 07/11/12 [phone]    [facility] has seen the patient. She has been accepted at [facility], MD 07/11/12 [phone]
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
## 2012-07-11 NOTE — Clinical Social Work Note (Signed)
BHH Group Notes:  (Clinical Social Work)  07/11/2012   3:00-4:00PM  Summary of Progress/Problems:   The main focus of today's process group was for the patient to identify something in their life that led to their hospitalization that they would like to change, then to discuss their motivation to change.  The Stages of Change were explained to the group, then each patient identified where they are in that process.    The patient expressed that she is a people pleaser and always takes care of everyone else.  She believes because of her moods and her family's history that she has Bipolar disorder.  She would like to get her emotions stabilized and her thoughts straight.  She is in the Preparation Stage.  Type of Therapy:  Process Group  Participation Level:  Active  Participation Quality:  Appropriate, Attentive and Sharing  Affect:  Blunted  Cognitive:  Alert, Appropriate and Oriented  Insight:  Engaged  Engagement in Therapy:  Engaged  Modes of Intervention:  Clarification, Support and Processing, Exploration, Discussion   Ambrose Mantle, LCSW 07/11/2012, 4:28 PM

## 2012-07-11 NOTE — BHH Counselor (Signed)
Patient Marilyn Fowler) was accepted to [facility] 07/11/2012. The accepting physician is Dr. Patrick North. The Axis I diagnosis is Major Depressive Disorder, Recurrent, Severe without Psychotic feature.The EDP-Dr. Mancel Bale was notified of patients disposition. Patients nurse was also made aware. The call report # is [phone]. All support paper was signed and faxed to [facility]. Patient is voluntary and will be transferred via hospital security.

## 2012-07-11 NOTE — BH Assessment (Signed)
BHH Assessment Progress Note      Consulted with [facility] [place] and Dr. Lolly Mustache who agreed to admit pt for inpatient treatment. Pt assigned to bed 507-1.  Per [facility] pt can be transferred to [facility] after 9am. Pt's ED nurse Latricia notified of this.   Glorious Peach, MS, LCASA Assessment Counselor

## 2012-07-11 NOTE — Tx Team (Signed)
Initial Interdisciplinary Treatment Plan  PATIENT STRENGTHS: (choose at least two) Ability for insight Average or above average intelligence Capable of independent living General fund of knowledge Supportive family/friends  PATIENT STRESSORS: Health problems Medication change or noncompliance   PROBLEM LIST: Problem List/Patient Goals Date to be addressed Date deferred Reason deferred Estimated date of resolution  depression                                                       DISCHARGE CRITERIA:  Ability to meet basic life and health needs Improved stabilization in mood, thinking, and/or behavior Verbal commitment to aftercare and medication compliance  PRELIMINARY DISCHARGE PLAN: Outpatient therapy Return to previous living arrangement Return to previous work or school arrangements  PATIENT/FAMIILY INVOLVEMENT: This treatment plan has been presented to and reviewed with the patient, Marietta Sikkema, and/or family member, .  The patient and family have been given the opportunity to ask questions and make suggestions.  Andrena Mews 07/11/2012, 12:25 PM

## 2012-07-11 NOTE — ED Notes (Signed)
Escorted by staff to [facility]. Belongings given to security. Ambulatory, rates pain at a 5.

## 2012-07-11 NOTE — BHH Suicide Risk Assessment (Signed)
Suicide Risk Assessment  Admission Assessment     Nursing information obtained from:   Records Demographic factors:   Lives with mother, difficult relationship, occupational stressors Current Mental Status:    Psychiatric Specialty Exam:  Physical Exam Please see physical performed by nurse practioner.   ROS Please see physical performed by nurse practioner.   Blood pressure 122/90, pulse 98, temperature 98.4 F (36.9 C), temperature source Oral, height 5' (1.524 m), weight 107.956 kg (238 lb), last menstrual period 06/23/2012.Body mass index is 46.48 kg/(m^2).   General Appearance: Casual and Well Groomed   Eye Contact:: Good   Speech: Clear and Coherent and Normal Rate   Volume: Normal   Mood: "anxious, irritable"   Affect: Congruent and Full Range   Thought Process: Coherent, Linear and Logical   Orientation: Full (Time, Place, and Person)   Thought Content: WDL   Suicidal Thoughts: No   Homicidal Thoughts: No   Memory: Immediate; Good  Recent; Fair  Remote; Fair   Judgement: Fair   Insight: Fair   Psychomotor Activity: Decreased   Concentration: Poor   Recall: Fair   Akathisia: No   Handed: Right   AIMS (if indicated): Not indicated   Assets: Communication Skills  Desire for Improvement  Housing  Vocational/Educational   Sleep: Patient reports she slept with Ativan and Ambien    Loss Factors:   None Historical Factors:   Previous episodes Risk Reduction Factors:   Children dependent on support.  CLINICAL FACTORS:   Bipolar Disorder:   Bipolar II  COGNITIVE FEATURES THAT CONTRIBUTE TO RISK:  Polarized thinking    SUICIDE RISK:   Mild:  Suicidal ideation of limited frequency, intensity, duration, and specificity.  There are no identifiable plans, no associated intent, mild dysphoria and related symptoms, good self-control (both objective and subjective assessment), few other risk factors, and identifiable protective factors, including available and accessible  social support.  PLAN OF CARE: Observation Level/Precautions: 15 minute checks   Laboratory: Reviewed   Psychotherapy: Patient to go for Group   Medications: Will Start Patient Abilify 5 mg. Will start Topamax 25 mg QHS.   Consultations: Possible medical consult if patient continues to have head.   Discharge Concerns: Medication Compliance   Estimated LOS: 5-7 days   Other: N/A    I certify that inpatient services furnished can reasonably be expected to improve the patient's condition.  Carmita Boom 07/11/2012, 4:26 PM
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
## 2012-07-11 NOTE — H&P (Addendum)
  Marilyn Fowler 04/15/1976 [id number]   Review of Systems  Constitutional: Negative.   HENT: Positive for neck pain (Patient states she has pain that shoots down the right side of her neck.  Feels that it may be related to the headaches). Negative for hearing loss, ear pain, nosebleeds, congestion, sore throat, tinnitus and ear discharge.   Eyes: Positive for photophobia. Negative for blurred vision, double vision, pain, discharge and redness.  Respiratory: Negative.  Negative for stridor.   Cardiovascular: Negative.   Gastrointestinal: Positive for nausea and vomiting. Negative for abdominal pain, diarrhea, constipation, blood in stool and melena.       N/V.  Patient states "I get nauseous and vomit when headache gets really bad."  Genitourinary: Negative.   Musculoskeletal: Negative for myalgias, back pain, joint pain and falls.  Skin: Negative.   Neurological: Positive for dizziness (with headaches but not constant) and headaches ("That's one of the reasons that I am here for.  I have had them pretty constant for the last 6 weeks.  Patient  states that being in a dark room and lying down helps  sometimes). Negative for tingling, tremors, sensory change, speech change, focal weakness, seizures and loss of consciousness.  Endo/Heme/Allergies: Negative.   Psychiatric/Behavioral: Positive for depression (Rates 8/10), suicidal ideas (SI thoughts with out a plan) and memory loss (Patient states that since she has started having the headaches she has become forgetful). Negative for hallucinations and substance abuse. The patient is nervous/anxious (Rates 10/10 related to being anxious about being here) and has insomnia (Patient states that she has a problem going and staying asleep and feels that it may be one of the reasons that she has headaches).      Physical Exam  Constitutional: She is oriented to person, place, and time. She appears well-developed and well-nourished.  HENT:  Head:  Normocephalic and atraumatic.  Right Ear: External ear normal.  Left Ear: External ear normal.  Nose: Nose normal.  Mouth/Throat: Oropharynx is clear and moist.  Eyes: Conjunctivae and EOM are normal.  Neck: Normal range of motion. Neck supple. No tracheal deviation present. No thyromegaly present.  Cardiovascular: Normal rate, regular rhythm, normal heart sounds and intact distal pulses.   Pulmonary/Chest: Effort normal and breath sounds normal.  Abdominal: Soft. Bowel sounds are normal. There is no tenderness. There is no guarding.  Musculoskeletal: Normal range of motion.  Lymphadenopathy:    She has no cervical adenopathy.  Neurological: She is alert and oriented to person, place, and time. She has normal reflexes.  Skin: Skin is warm and dry.  Psychiatric: Her speech is normal and behavior is normal. Judgment normal. Her mood appears anxious. Thought content is not paranoid. Cognition and memory are normal. She exhibits a depressed mood. She expresses suicidal ideation. She expresses no homicidal ideation. She expresses no suicidal plans.   Seen and discussed with provider and agree with above findings. Will consider medicine consult if necessary for headaches.  Jacqulyn Cane, M.D.  07/11/2012 10:24 PM

## 2012-07-12 DIAGNOSIS — I10 Essential (primary) hypertension: Secondary | ICD-10-CM | POA: Diagnosis present

## 2012-07-12 DIAGNOSIS — G43909 Migraine, unspecified, not intractable, without status migrainosus: Secondary | ICD-10-CM | POA: Diagnosis present

## 2012-07-12 MED ORDER — SUMATRIPTAN SUCCINATE 25 MG PO TABS
25.0000 mg | ORAL_TABLET | ORAL | Status: DC | PRN
Start: 2012-07-12 — End: 2012-07-21
  Administered 2012-07-13 – 2012-07-20 (×11): 25 mg via ORAL
  Filled 2012-07-12 (×11): qty 1

## 2012-07-12 MED ORDER — METOPROLOL TARTRATE 50 MG PO TABS
50.0000 mg | ORAL_TABLET | Freq: Two times a day (BID) | ORAL | Status: DC
  Administered 2012-07-12 – 2012-07-14 (×4): 50 mg via ORAL
  Filled 2012-07-12 (×10): qty 1

## 2012-07-12 MED ORDER — AMLODIPINE BESYLATE 10 MG PO TABS
10.0000 mg | ORAL_TABLET | Freq: Every day | ORAL | Status: DC
  Administered 2012-07-12 – 2012-07-21 (×10): 10 mg via ORAL
  Filled 2012-07-12 (×3): qty 1
  Filled 2012-07-12: qty 2
  Filled 2012-07-12 (×2): qty 1
  Filled 2012-07-12: qty 2
  Filled 2012-07-12 (×6): qty 1

## 2012-07-12 MED ORDER — LISINOPRIL 20 MG PO TABS
20.0000 mg | ORAL_TABLET | Freq: Every day | ORAL | Status: DC
  Filled 2012-07-12 (×2): qty 1

## 2012-07-12 MED ORDER — IBUPROFEN 600 MG PO TABS
600.0000 mg | ORAL_TABLET | Freq: Four times a day (QID) | ORAL | Status: DC | PRN
  Administered 2012-07-13: 600 mg via ORAL
  Filled 2012-07-12: qty 1

## 2012-07-12 MED ORDER — PROPRANOLOL HCL ER 60 MG PO CP24
60.0000 mg | ORAL_CAPSULE | Freq: Every day | ORAL | Status: DC
  Filled 2012-07-12 (×2): qty 1

## 2012-07-12 MED ORDER — PROPRANOLOL HCL ER 120 MG PO CP24
120.0000 mg | ORAL_CAPSULE | Freq: Every day | ORAL | Status: DC
  Filled 2012-07-12 (×2): qty 1

## 2012-07-12 NOTE — Progress Notes (Signed)
Patient ID: Marilyn Fowler, female   DOB: August 25, 1975, 37 y.o.   MRN: [id number]  S: Ms. [name] presents with increased blood pressure accompanied with radiating headaches.    O: B/p readings, 152/99, 150/99, 156/96       Pulse, 82, 108, 89, 103   A:  Hypertension  P: primary care consulted for evaluation and recommendation. Initiated propranolol ER 60 mg daily, and Ibuprofen 600 mg Q 6 hours for headache pains.

## 2012-07-12 NOTE — Progress Notes (Signed)
Patient ID: Marilyn Fowler, female   DOB: 01-Jul-1975, 37 y.o.   MRN: [id number] D)  Has been pleasant, interacting appropriately with select peers and staff, attended group.  Voiced some concern when she came to med window, stated she had problems sleeping and wouldn't be able to sleep without ambien.  MD notified, order obtained.  Was also made aware of HTN,  Stated would be putting in for med consult to evaluate.  Pt seemed relieved as she has had so many headaches, may be r/t HTN.    A)  Will continue to monitor for safety, continue POC. R)  Remains safe on unit.

## 2012-07-12 NOTE — Progress Notes (Signed)
D) Pt rates her depression at a 8 and her hopelessness at a 7. Admits to thoughts of SI on and off. States that she did not sleep last night and is very tired. Has attended the groups but goes back to her bed to sleep. Pt's mother comes to meals and eats with her and Pt is grateful for the support. A) Given support and reassurance along with praise. Given encouragement to do her work books and to attend the groups. Verbal contract made with Pt for her safety. R)Pt states she will remain safe while in the hospital. Is attempting to rest due to her lack of sleep.

## 2012-07-12 NOTE — Progress Notes (Signed)
[facility] Group Notes:  (Nursing/MHT/Case Management/Adjunct)  Date:  07/11/2012 Time:  2000  Type of Therapy:  Psychoeducational Skills  Participation Level:  Minimal  Participation Quality:  Appropriate  Affect:  Appropriate  Cognitive:  Lacking  Insight:  Lacking  Engagement in Group:  Lacking  Modes of Intervention:  Education  Summary of Progress/Problems: The patient arrived late for group since she was in conference with her doctor. Her goal for tomorrow is to determine whether or not her medications have been effective.   Ulmer Degen S 07/12/2012, 1:21 AM

## 2012-07-12 NOTE — Progress Notes (Signed)
[facility] MD Progress Note  07/12/2012 12:51 PM Marilyn Fowler  MRN:  [id number] Subjective:  Marilyn Fowler is a 37 y/o woman with a past psychiatric history significant for Bipolar I Disorder. She reports she continues to have headaches. He blood pressures continue to be elevated but she hasn't been to a PCP. She states that he blood pressures were 150's-160's/100s. She reports that she would of slept better but her roommate was snoring. She states she continues to have a headache, which is "throbbing in nature", she reports it at a level 8-9/10.  She reports pain is in temples and back of eyes, radiating to the back of her neck. She endorses continued depression. She reports having taken Topamax and Abilify and denies any side effects.  Diagnosis:  Axis I: Bipolar II Disorder  ADL's:  Intact  Sleep: Poor  Appetite:  Fair  Suicidal Ideation:  Plan:  Patient denies. Intent:  Patient denies. Means:  Patient denies. Homicidal Ideation:  Plan:  Patient denies. Intent:  Patient denies. Means:  Patient denies.  AEB (as evidenced by):  Psychiatric Specialty Exam: Review of Systems  Constitutional: Negative for fever, chills and malaise/fatigue.  Respiratory: Negative for cough, shortness of breath and wheezing.   Cardiovascular: Positive for palpitations (Possible but doesn's last very long possibly due to stress. ). Negative for chest pain.  Gastrointestinal: Positive for nausea and vomiting. Negative for abdominal pain, diarrhea and constipation.  Genitourinary: Negative for dysuria.  Neurological: Positive for headaches. Negative for dizziness.    Blood pressure 150/96, pulse 103, temperature 97.9 F (36.6 C), temperature source Oral, resp. rate 16, height 5' (1.524 m), weight 107.956 kg (238 lb), last menstrual period 06/23/2012.Body mass index is 46.48 kg/(m^2).  General Appearance: Casual and Well Groomed  Eye Contact::  Good  Speech:  Clear and Coherent and Normal Rate  Volume:  Normal   Mood:  "depressed"  Affect:  Congruent and Full Range  Thought Process:  Coherent, Goal Directed, Linear and Logical  Orientation:  Full (Time, Place, and Person)  Thought Content:  WDL  Suicidal Thoughts:  No  Homicidal Thoughts:  No  Memory:  Immediate;   Good Recent;   Fair Remote;   Fair  Judgement:  Fair  Insight:  Fair  Psychomotor Activity:  Normal  Concentration:  Good  Recall:  Fair  Akathisia:  No  Handed:  Right  AIMS (if indicated):   As noted in chart.  Assets:  Solicitor Intimacy Leisure Time Physical Health Social Support Talents/Skills Transportation Vocational/Educational  Sleep:  Number of Hours: 3.25   Current Medications: Current Facility-Administered Medications  Medication Dose Route Frequency Provider Last Rate Last Dose  . acetaminophen (TYLENOL) tablet 650 mg  650 mg Oral Q6H PRN Larena Sox, MD   650 mg at 07/12/12 0612  . alum & mag hydroxide-simeth (MAALOX/MYLANTA) 200-200-20 MG/5ML suspension 30 mL  30 mL Oral Q4H PRN Larena Sox, MD      . ARIPiprazole (ABILIFY) tablet 5 mg  5 mg Oral Daily Larena Sox, MD   5 mg at 07/11/12 2148  . hydrOXYzine (ATARAX/VISTARIL) tablet 25 mg  25 mg Oral QID PRN Larena Sox, MD   25 mg at 07/12/12 0810  . influenza  inactive virus vaccine (FLUZONE/FLUARIX) injection 0.5 mL  0.5 mL Intramuscular Tomorrow-1000 Himabindu Ravi, MD      . magnesium hydroxide (MILK OF MAGNESIA) suspension 30 mL  30 mL Oral Daily PRN Larena Sox, MD      .  topiramate (TOPAMAX) tablet 25 mg  25 mg Oral QHS Larena Sox, MD   25 mg at 07/11/12 2148  . zolpidem (AMBIEN) tablet 5 mg  5 mg Oral QHS PRN Larena Sox, MD   5 mg at 07/11/12 2306    Lab Results:  Results for orders placed during the hospital encounter of 07/10/12 (from the past 48 hour(s))  POCT PREGNANCY, URINE     Status: None   Collection Time    07/10/12 12:57 PM      Result Value Range    Preg Test, Ur NEGATIVE  NEGATIVE   Comment:            THE SENSITIVITY OF THIS     METHODOLOGY IS >24 mIU/mL    Physical Findings: AIMS: Facial and Oral Movements Muscles of Facial Expression: None, normal Lips and Perioral Area: None, normal Jaw: None, normal Tongue: None, normal,Extremity Movements Upper (arms, wrists, hands, fingers): None, normal Lower (legs, knees, ankles, toes): None, normal, Trunk Movements Neck, shoulders, hips: None, normal, Overall Severity Severity of abnormal movements (highest score from questions above): None, normal Incapacitation due to abnormal movements: None, normal Patient's awareness of abnormal movements (rate only patient's report): No Awareness, Dental Status Current problems with teeth and/or dentures?: No Does patient usually wear dentures?: No  CIWA:    COWS:  COWS Total Score: 1  Treatment Plan Summary: Daily contact with patient to assess and evaluate symptoms and progress in treatment Medication management As noted below.  Plan: 1. Continue Topamax 25 mg.  2. Continue Abilify 5 mg 3. Will order medicine consult for Hypertension. She reports her mother and sister are allergic to ACE inhibitors.   Medical Decision Making Problem Points:  Established problem, stable/improving (1) and Review of psycho-social stressors (1) Data Points:  Order Aims Assessment (2) Review of medication regiment & side effects (2) Review of new medications or change in dosage (2)  I certify that inpatient services furnished can reasonably be expected to improve the patient's condition.   Elody Kleinsasser 07/12/2012, 12:51 PM

## 2012-07-12 NOTE — Progress Notes (Signed)
Psychoeducational Group Note  Date:  07/12/2012 Time:  1015  Group Topic/Focus:  Making Healthy Choices:   The focus of this group is to help patients identify negative/unhealthy choices they were using prior to admission and identify positive/healthier coping strategies to replace them upon discharge.  Participation Level:  Did Not Attend   Marilyn Fowler 07/12/2012

## 2012-07-12 NOTE — H&P (Signed)
[facility] Consult Note                                                                                    Patient Demographics  Marilyn Fowler, is a 37 y.o. female  CSN: [id number]  MRN: [id number]  DOB - 04/08/1976  Admit Date - 07/11/2012  Outpatient Primary MD for the patient is No primary provider on file.  Consult requested in the Hospital by Patrick North, MD, On 07/12/2012    Reason for consult HTN   With History of -  Past Medical History  Diagnosis Date  . Migraine       Past Surgical History  Procedure Laterality Date  . Tonsillectomy    . Hernia repair    . Hemorrhoid surgery      in for   No chief complaint on file.    HPI  Marilyn Fowler  is a 37 y.o. female, with H/O migraines, HTN, Obesity, Bipolar,  admitted at [facility] for suicidal thoughts, I was called for HTN management, pt asymptomatic except for intermittent Migraine headache with mild nausea, prefers dark room, headache n +/- factors. No fevers, no neck stiffness, no previous Rx for HTN, has used Imitrex with success for headaches in the past.  Review of Systems    In addition to the HPI above,   No Fever-chills, +ve temporal intermittent Headaches, No changes with Vision or hearing, No problems swallowing food or Liquids, No Chest pain, Cough or Shortness of Breath, No Abdominal pain, No Nausea or Vommitting, Bowel movements are regular, No Blood in stool or Urine, No dysuria, No new skin rashes or bruises, No new joints pains-aches,  No new weakness, tingling, numbness in any extremity, No recent weight gain or loss, No polyuria, polydypsia or polyphagia, No significant Mental Stressors.  A full 10 point Review of Systems was done, except as stated above, all other Review of Systems were negative.   Social History History  Substance Use Topics  . Smoking status: Former Games developer  . Smokeless tobacco: Not on file  . Alcohol Use: Yes     Comment: socially       Family History Family History  Problem Relation Age of Onset  . Osteoarthritis Mother   . Diabetes Mother   . Hypertension Father   . Diabetes Father       Prior to Admission medications   Medication Sig Start Date End Date Taking? Authorizing Provider  acetaminophen (TYLENOL) 500 MG tablet Take 500 mg by mouth every 6 (six) hours as needed for pain (pain).    Historical Provider, MD  Aspirin-Acetaminophen-Caffeine (GOODYS EXTRA STRENGTH) [id number] MG PACK Take 1 packet by mouth daily as needed (pain).    Historical Provider, MD  naproxen (NAPROSYN) 500 MG tablet Take 500 mg by mouth 2 (two) times daily with a meal.    Historical Provider, MD  zolpidem (AMBIEN CR) 6.25 MG CR tablet Take 6.25 mg by mouth at bedtime as needed for sleep (sleep).    Historical Provider, MD    Anti-infectives   None      Scheduled Meds: . amLODipine  10 mg Oral Daily  . ARIPiprazole  5 mg Oral Daily  . influenza  inactive virus vaccine  0.5 mL Intramuscular Tomorrow-1000  . metoprolol tartrate  50 mg Oral BID  . topiramate  25 mg Oral QHS   Continuous Infusions:  PRN Meds:.acetaminophen, alum & mag hydroxide-simeth, hydrOXYzine, ibuprofen, magnesium hydroxide, SUMAtriptan, zolpidem  No Known Allergies  Physical Exam  Vitals  Blood pressure 150/96, pulse 103, temperature 97.9 F (36.6 C), temperature source Oral, resp. rate 16, height 5' (1.524 m), weight 107.956 kg (238 lb), last menstrual period 06/23/2012.   1. General young obese white female sitting NAD,    2. Normal affect and insight, Not Suicidal or Homicidal, Awake Alert, Oriented X 3.  3. No F.N deficits, ALL C.Nerves Intact, Strength 5/5 all 4 extremities, Sensation intact all 4 extremities, Plantars down going.  4. Ears and Eyes appear Normal, Conjunctivae clear, PERRLA. Moist Oral Mucosa.  5. Supple Neck, No JVD, No cervical lymphadenopathy appriciated, No Carotid Bruits.  6. Symmetrical Chest wall movement, Good  air movement bilaterally, CTAB.  7. RRR, No Gallops, Rubs or Murmurs, No Parasternal Heave.  8. Positive Bowel Sounds, Abdomen Soft, Non tender, No organomegaly appriciated,No rebound -guarding or rigidity.  9.  No Cyanosis, Normal Skin Turgor, No Skin Rash or Bruise.  10. Good muscle tone,  joints appear normal , no effusions, Normal ROM.  11. No Palpable Lymph Nodes in Neck or Axillae     Data Review  CBC  Recent Labs Lab 07/10/12 1245  WBC 7.6  HGB 12.3  HCT 36.9  PLT 286  MCV 81.5  MCH 27.2  MCHC 33.3  RDW 13.8   ------------------------------------------------------------------------------------------------------------------  Chemistries   Recent Labs Lab 07/10/12 1245  NA 137  K 3.9  CL 102  CO2 27  GLUCOSE 86  BUN 12  CREATININE 0.78  CALCIUM 9.1  AST 33  ALT 31  ALKPHOS 97  BILITOT 0.3   ------------------------------------------------------------------------------------------------------------------ estimated creatinine clearance is 109.2 ml/min (by C-G formula based on Cr of 0.78). ------------------------------------------------------------------------------------------------------------------ No results found for this basename: TSH, T4TOTAL, FREET3, T3FREE, THYROIDAB,  in the last 72 hours   Coagulation profile No results found for this basename: INR, PROTIME,  in the last 168 hours ------------------------------------------------------------------------------------------------------------------- No results found for this basename: DDIMER,  in the last 72 hours -------------------------------------------------------------------------------------------------------------------  Cardiac Enzymes No results found for this basename: CK, CKMB, TROPONINI, MYOGLOBIN,  in the last 168 hours ------------------------------------------------------------------------------------------------------------------ No components found with this basename: POCBNP,     ---------------------------------------------------------------------------------------------------------------  Urinalysis No results found for this basename: colorurine, appearanceur, labspec, phurine, glucoseu, hgbur, bilirubinur, ketonesur, proteinur, urobilinogen, nitrite, leukocytesur        Assessment & Plan    1. HTN - meds started, lopressor + norvasc, monitor and adjust, avoid ACE-ARB strong H/O allergy in parents.   2.H/O Migranes- some headaches - imitrex and Motrin PRN   3.Bipolar- per Psych      Thank you for the consult, we will follow the patient with you in the [facility].   [facility]  [phone]

## 2012-07-12 NOTE — Progress Notes (Signed)
Psychoeducational Group Note  Psychoeducational Group Note  Date: 07/12/2012 Time:  07/12/2012  Group Topic/Focus:  Gratefulness:  The focus of this group is to help patients identify what two things they are most grateful for in their lives. What helps ground them and to center them on their work to their recovery.  Participation Level:  Active  Participation Quality:  Appropriate  Affect:  Flat  Cognitive:  Alert  Insight:  Improving  Engagement in Group:  Engaged  Additional Comments:    Marilyn Fowler A  

## 2012-07-12 NOTE — Clinical Social Work Note (Signed)
[facility] Group Notes:  (Clinical Social Work)  07/12/2012   3:00-4:00PM  Summary of Progress/Problems:    The main focus of today's process group was to define "support" and describe what healthy supports are.  We then discussed how and why to increase patient supports, using motivational interviewing.  An emphasis was placed on using counselor, doctor, therapy groups, self-help groups and problem-specific support groups to expand supports.   The patient expressed she has some good supports, has supported people in the past and has some knowledge of how challenging it can be.  She demonstrated concern and empathy for a fellow group member who was crying and feeling very isolated.  Type of Therapy:  Process Group  Participation Level:  Active  Participation Quality:  Appropriate, Attentive, Sharing and Supportive  Affect:  Appropriate  Cognitive:  Alert, Appropriate and Oriented  Insight:  Engaged  Engagement in Therapy:  Engaged  Modes of Intervention:  Education,  Support and Processing, Exploration, Discussion   Ambrose Mantle, LCSW 07/12/2012, 4:56 PM

## 2012-07-13 MED ORDER — BUPROPION HCL 75 MG PO TABS
75.0000 mg | ORAL_TABLET | Freq: Every day | ORAL | Status: DC
  Administered 2012-07-13 – 2012-07-16 (×4): 75 mg via ORAL
  Filled 2012-07-13 (×6): qty 1

## 2012-07-13 MED ORDER — HYDRALAZINE HCL 25 MG PO TABS
25.0000 mg | ORAL_TABLET | Freq: Three times a day (TID) | ORAL | Status: DC
  Administered 2012-07-13 – 2012-07-21 (×23): 25 mg via ORAL
  Filled 2012-07-13 (×27): qty 1

## 2012-07-13 NOTE — Progress Notes (Signed)
D:  Patient's self inventory sheet, patient has fair sleep, good appetite, low energy level, poor attention span.  Rated depression and hopelessness #7.  Denied withdrawals.  SI, contracts for safety.  Has experienced pain, dizziness, headaches idn past 24 hours.  Zero pain goal today.  Worst pain #7.  After discharge, plans to eat better, exercise, sleep better.  No questions for staff.  Needs financial assistance to purchase meds after discharge. A:  Medications administered per MD order.  Staff monitoring patient every 15 minutes for safety.  Emotional support and encouragement given to patient.   R:  Patient SI, contracts for safety.   Denied HI.   Denied A/V hallucinations.  Denied pain.  Patient remains safe on unit.
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
## 2012-07-13 NOTE — Tx Team (Signed)
Interdisciplinary Treatment Plan Update   Date Reviewed:  07/13/2012  Time Reviewed:  9:51 AM  Progress in Treatment:   Attending groups: Yes Participating in groups: Yes Taking medication as prescribed: Yes  Tolerating medication: Yes Family/Significant other contact made: No, but will contact family if patient gives consent. Patient understands diagnosis: Yes  Discussing patient identified problems/goals with staff: Yes Medical problems stabilized or resolved: Yes Denies suicidal/homicidal ideation: Yes Patient has not harmed self or others: Yes  For review of initial/current patient goals, please see plan of care.  Estimated Length of Stay:    Reasons for Continued Hospitalization:  Anxiety Depression Medication stabilization Suicidal ideation  New Problems/Goals identified:    Discharge Plan or Barriers:   Home with outpatient follow up  Additional Comments:  Attendees:  Patient:  07/13/2012 9:51 AM   Signature: Patrick North, MD 07/13/2012 9:51 AM  Signature: 07/13/2012 9:51 AM  Signature:  07/13/2012 9:51 AM  Signature:Beverly Terrilee Croak, RN 07/13/2012 9:51 AM  Signature:  Neill Loft RN 07/13/2012 9:51 AM  Signature:  Juline Patch, LCSW 07/13/2012 9:51 AM  Signature: Silverio Decamp, PMH-NP 07/13/2012 9:51 AM  Signature: 07/13/2012 9:51 AM  Signature: 07/13/2012 9:51 AM  Signature:    Signature:    Signature:      Scribe for Treatment Team:   Juline Patch,  07/13/2012 9:51 AM

## 2012-07-13 NOTE — Progress Notes (Signed)
Adult Psychoeducational Group Note  Date:  07/13/2012 Time:  11:00AM Group Topic/Focus:  Self Care:   The focus of this group is to help patients understand the importance of self-care in order to improve or restore emotional, physical, spiritual, interpersonal, and financial health.  Participation Level:  Active  Participation Quality:  Appropriate, Sharing and Supportive  Affect:  Appropriate  Cognitive:  Alert and Appropriate  Insight: Appropriate  Engagement in Group:  Engaged and Supportive  Modes of Intervention:  Discussion  Additional Comments:  Pt. Was attentive and appropriate during today's group discussion. Pt. Was able to discuss and share her thought on Self Care. Pt. Stated that she reads literature that is unrelated to work, attend to minimizing stress in her life. Pt. Is working on saying NO and taking on extra responsibilities.   Bing Plume D 07/13/2012, 1:49 PM

## 2012-07-13 NOTE — Progress Notes (Signed)
Adult Psychoeducational Group Note  Date:  07/13/2012 Time:  1:37 AM  Group Topic/Focus:  Wrap-Up Group:   The focus of this group is to help patients review their daily goal of treatment and discuss progress on daily workbooks.  Participation Level:  Active  Participation Quality:  Appropriate  Affect:  Appropriate  Cognitive:  Appropriate  Insight: Appropriate  Engagement in Group:  Engaged  Modes of Intervention:  Discussion  Additional Comments:  Marilyn Fowler stated that she met her goal for today, which was to start taking her meds. She also stated that her headaches seemed to be going away since starting a medication for high blood pressure. Patient stated that she felt influenced by the drab weather today, but has has an otherwise "ok" day.   Marilyn Fowler 07/13/2012, 1:37 AM
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
## 2012-07-13 NOTE — Progress Notes (Signed)
Recreation Therapy Notes  Date: 03.17.2014  Time: 2:30am  Location: [facility] Art Room   Group Topic/Focus: Values Clarification   Participation Level:  Active   Participation Quality:  Appropriate   Affect:  Euthymic   Cognitive:  Oriented   Additional Comments: Patient was given a worksheet with a shamrock on it. Patient was asked to identify six values that make up her core foundation and list them on the worksheet. Patient listed the following values on her worksheet: Loving, Dependable, Strong-willed, Trustworthy, Passionate.. Patient stated that values have a positive effect on wellness because they "remind you of the good in you." Patient participated in group discussion about values having a positive impact on wellness and coping mechanisms.   Marykay Lex Janeice Stegall, LRT/CTRS   Jearl Klinefelter 07/13/2012 3:29 PM
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
## 2012-07-13 NOTE — Progress Notes (Signed)
[facility] MD Progress Note  07/13/2012 11:46 AM Marilyn Fowler  MRN:  [id number] Subjective:  Patient states being very depressed, anxious and having racing thoughts. She reports her mother has bipolar disorder but she herself does not have any of the manic symptoms she has observed in her mother. Has not been on any medications for 18 years.  She reports feeling quite hopeless. She states 'it may be better off for my kids if I am not here' endorsing suicidal thoughts on and off.  Diagnosis:   Axis I: Major Depression, Recurrent severe Axis II: Deferred Axis III:  Past Medical History  Diagnosis Date  . Migraine    Axis IV: economic problems, occupational problems and other psychosocial or environmental problems Axis V: 41-50 serious symptoms  ADL's:  Intact  Sleep: Poor  Appetite:  Fair    Psychiatric Specialty Exam: Review of Systems  Constitutional: Negative.   Eyes: Negative.   Respiratory: Negative.   Cardiovascular: Negative.   Gastrointestinal: Negative.   Genitourinary: Negative.   Musculoskeletal: Negative.   Skin: Negative.   Neurological: Positive for headaches.  Endo/Heme/Allergies: Negative.   Psychiatric/Behavioral: Positive for depression. The patient is nervous/anxious and has insomnia.     Blood pressure 133/86, pulse 77, temperature 98.4 F (36.9 C), temperature source Oral, resp. rate 18, height 5' (1.524 m), weight 107.956 kg (238 lb), last menstrual period 06/23/2012.Body mass index is 46.48 kg/(m^2).  General Appearance: Casual  Eye Contact::  Poor  Speech:  Clear and Coherent  Volume:  Decreased  Mood:  Depressed, Dysphoric and Hopeless  Affect:  Constricted, Depressed and Restricted  Thought Process:  Coherent  Orientation:  Full (Time, Place, and Person)  Thought Content:  WDL and Rumination  Suicidal Thoughts:  Yes, with no specific plan currently  Homicidal Thoughts:  No  Memory:  Immediate;   Fair Recent;   Fair Remote;   Fair  Judgement:   Fair  Insight:  Present  Psychomotor Activity:  Decreased  Concentration:  Fair  Recall:  Fair  Akathisia:  No  Handed:  Right  AIMS (if indicated):     Assets:  Communication Skills Desire for Improvement Housing Social Support Vocational/Educational  Sleep:  Number of Hours: 5.75   Current Medications: Current Facility-Administered Medications  Medication Dose Route Frequency Provider Last Rate Last Dose  . acetaminophen (TYLENOL) tablet 650 mg  650 mg Oral Q6H PRN Larena Sox, MD   650 mg at 07/12/12 1502  . alum & mag hydroxide-simeth (MAALOX/MYLANTA) 200-200-20 MG/5ML suspension 30 mL  30 mL Oral Q4H PRN Larena Sox, MD      . amLODipine (NORVASC) tablet 10 mg  10 mg Oral Daily Leroy Sea, MD   10 mg at 07/13/12 0826  . ARIPiprazole (ABILIFY) tablet 5 mg  5 mg Oral Daily Larena Sox, MD   5 mg at 07/12/12 2202  . hydrOXYzine (ATARAX/VISTARIL) tablet 25 mg  25 mg Oral QID PRN Larena Sox, MD   25 mg at 07/13/12 0626  . ibuprofen (ADVIL,MOTRIN) tablet 600 mg  600 mg Oral Q6H PRN Sanjuana Kava, NP      . magnesium hydroxide (MILK OF MAGNESIA) suspension 30 mL  30 mL Oral Daily PRN Larena Sox, MD      . metoprolol (LOPRESSOR) tablet 50 mg  50 mg Oral BID Leroy Sea, MD   50 mg at 07/13/12 0826  . SUMAtriptan (IMITREX) tablet 25 mg  25 mg Oral Q2H PRN Prashant  Curlene Labrum, MD   25 mg at 07/13/12 8295  . topiramate (TOPAMAX) tablet 25 mg  25 mg Oral QHS Larena Sox, MD   25 mg at 07/12/12 2202  . zolpidem (AMBIEN) tablet 5 mg  5 mg Oral QHS PRN Larena Sox, MD   5 mg at 07/12/12 2220    Lab Results: No results found for this or any previous visit (from the past 48 hour(s)).  Physical Findings: AIMS: Facial and Oral Movements Muscles of Facial Expression: None, normal Lips and Perioral Area: None, normal Jaw: None, normal Tongue: None, normal,Extremity Movements Upper (arms, wrists, hands, fingers): None, normal Lower (legs, knees,  ankles, toes): None, normal, Trunk Movements Neck, shoulders, hips: None, normal, Overall Severity Severity of abnormal movements (highest score from questions above): None, normal Incapacitation due to abnormal movements: None, normal Patient's awareness of abnormal movements (rate only patient's report): No Awareness, Dental Status Current problems with teeth and/or dentures?: No Does patient usually wear dentures?: No  CIWA:  CIWA-Ar Total: 1 COWS:  COWS Total Score: 1  Treatment Plan Summary: Daily contact with patient to assess and evaluate symptoms and progress in treatment Medication management  Plan: Start Bupropion at 75mg  po qd, side effects and benefits discussed. Will continue Abilify for now, may discontinue since patient does not meet criteria for bipolar disorder. Patient seen by hospitalist and was started on medications for her hypertension. Patient counselled to work on her coping skills, educated about depression as a medical condition and how she has to take care of herself.  Medical Decision Making Problem Points:  Established problem, stable/improving (1), Review of last therapy session (1) and Review of psycho-social stressors (1) Data Points:  Review of medication regiment & side effects (2) Review of new medications or change in dosage (2)  I certify that inpatient services furnished can reasonably be expected to improve the patient's condition.   Marilyn Fowler 07/13/2012, 11:46 AM

## 2012-07-13 NOTE — Progress Notes (Addendum)
[facility] PROGRESS NOTE  Marilyn Fowler FVC:[id number] DOB: [birth year] DOA: 07/11/2012 PCP: No primary provider on file.  Assessment/Plan:  Hypertension  started on amlodipine and metoprolol yesterday. BP much stable this am but has been high again.  Recommend continuing current dose of metoprolol and amlodipine. Will add hydralazine and if stable will cut back on metoprolol dose. .  Counseled strongly on dietary restrictions , regular exercise and encouraged to lose weight. Check 12 lead EKG  H/x of migraine Continue Imitrex and motrin prn    Major depressive disorder Per primary   Will follow again tomorrow.  HPI/Subjective: Denies any symptoms  Objective: Filed Vitals:   07/13/12 0602 07/13/12 0913 07/13/12 1150 07/13/12 1200  BP: 133/83 133/86 157/101 138/100  Pulse: 83 77 63 65  Temp:   98.3 F (36.8 C) 98.3 F (36.8 C)  TempSrc:   Oral Oral  Resp:      Height:      Weight:       No intake or output data in the 24 hours ending 07/13/12 1452 Filed Weights   07/11/12 1120 07/11/12 1520  Weight: 107.956 kg (238 lb) 107.956 kg (238 lb)    Exam:   General:  Middle aged obese female in NAD  HEENT: no pallor, moist mucosa  Cardiovascular: N S1&S2, no murmurs  Respiratory: clear b/l, no added sounds  Abdomen: soft, NT, ND, BS+  Musculoskeletal: warm, no edema   CNS: AAOX 3   Data Reviewed: Basic Metabolic Panel:  Recent Labs Lab 07/10/12 1245  NA 137  K 3.9  CL 102  CO2 27  GLUCOSE 86  BUN 12  CREATININE 0.78  CALCIUM 9.1   Liver Function Tests:  Recent Labs Lab 07/10/12 1245  AST 33  ALT 31  ALKPHOS 97  BILITOT 0.3  PROT 7.4  ALBUMIN 4.1   No results found for this basename: LIPASE, AMYLASE,  in the last 168 hours No results found for this basename: AMMONIA,  in the last 168 hours CBC:  Recent Labs Lab 07/10/12 1245  WBC 7.6  HGB 12.3  HCT 36.9  MCV 81.5  PLT 286   Cardiac Enzymes: No results found for this  basename: CKTOTAL, CKMB, CKMBINDEX, TROPONINI,  in the last 168 hours BNP (last 3 results) No results found for this basename: PROBNP,  in the last 8760 hours CBG: No results found for this basename: GLUCAP,  in the last 168 hours  No results found for this or any previous visit (from the past 240 hour(s)).   Studies: No results found.  Scheduled Meds: . amLODipine  10 mg Oral Daily  . ARIPiprazole  5 mg Oral Daily  . buPROPion  75 mg Oral Daily  . metoprolol tartrate  50 mg Oral BID  . topiramate  25 mg Oral QHS   Continuous Infusions:     Time spent: 25 minutes    Marilyn Fowler  [facility] Pager [phone]. If 7PM-7AM, please contact night-coverage at [email or web address], password [facility] 07/13/2012, 2:52 PM  LOS: 2 days

## 2012-07-14 DIAGNOSIS — F3189 Other bipolar disorder: Secondary | ICD-10-CM

## 2012-07-14 NOTE — Progress Notes (Signed)
Adult Psychoeducational Group Note  Date:  07/14/2012 Time:  10:17 AM  Group Topic/Focus:  Recovery Goals:   The focus of this group is to identify appropriate goals for recovery and establish a plan to achieve them.  Participation Level:  Active  Participation Quality:  Appropriate, Attentive and Sharing  Affect:  Appropriate  Cognitive:  Alert and Appropriate  Insight: Appropriate  Engagement in Group:  Engaged  Modes of Intervention:  Discussion  Additional Comments: Pt was appropriate and supportive while attending group. Pt stated that she feel that "self" is what stands between her and recovery.  Sharyn Lull 07/14/2012, 10:17 AM

## 2012-07-14 NOTE — Progress Notes (Signed)
[facility] INPATIENT:  Family/Significant Other Suicide Prevention Education  Suicide Prevention Education:  Education Completed; Marilyn Fowler, Mother, [phone] has been identified by the patient as the family member/significant other with whom the patient will be residing, and identified as the person(s) who will aid the patient in the event of a mental health crisis (suicidal ideations/suicide attempt).  With written consent from the patient, the family member/significant other has been provided the following suicide prevention education, prior to the and/or following the discharge of the patient.  The suicide prevention education provided includes the following:  Suicide risk factors  Suicide prevention and interventions  National Suicide Hotline telephone number  [facility] assessment telephone number  [facility] Emergency Assistance 911  [facility] and/or Residential Mobile Crisis Unit telephone number  Request made of family/significant other to:  Remove weapons (e.g., guns, rifles, knives), all items previously/currently identified as safety concern.  Mother reports there are no guns in the home.  Remove drugs/medications (over-the-counter, prescriptions, illicit drugs), all items previously/currently identified as a safety concern.  The family member/significant other verbalizes understanding of the suicide prevention education information provided.  The family member/significant other agrees to remove the items of safety concern listed above.  Marilyn Fowler 07/14/2012, 4:26 PM

## 2012-07-14 NOTE — Progress Notes (Signed)
Adult Psychoeducational Fowler Note  Date:  07/14/2012 Time:  10:29 PM  Fowler Topic/Focus:  Making Healthy Choices:   The focus of this Fowler is to help patients identify negative/unhealthy choices they were using prior to admission and identify positive/healthier coping strategies to replace them upon discharge.  Participation Level:  Active  Participation Quality:  Supportive  Affect:  Appropriate  Cognitive:  Appropriate  Insight: Appropriate  Engagement in Fowler:  Supportive  Modes of Intervention:  Problem-solving  Additional Comments:  Marilyn Fowler.    [name] [place] 07/14/2012, 10:29 PM

## 2012-07-14 NOTE — Progress Notes (Signed)
Patient ID: Marilyn Fowler, female   DOB: November 12, 1975, 37 y.o.   MRN: [id number]  D: Pt denies SI/HI/AVH. Pt is pleasant and cooperative. Pt states she had HA all day that was 8 out of 10, but tonight her pain was decreased to the point that she did not need pain medication. Pt states her roommate offers good support. Pt states her mind was racing when she was admitted , but she feels better now.    A: Pt was offered support and encouragement. Pt was given scheduled medications. Pt was encourage to attend groups. Q 15 minute checks were done for safety.   R:Pt attends groups and interacts well with peers and staff. Pt is taking medication. Pt has no complaints at this time.Pt receptive to treatment and safety maintained on unit.

## 2012-07-14 NOTE — Progress Notes (Addendum)
  Patient's blood pressure seems much stable after adding hydralazine yesterday. Patient has not been complaining of any dizziness. EKG unremarkable. I would recommend stopping the metoprolol and continuing current dose of hydralazine and amlodipine. I have discussed the recommendations with Nanine Means , NP over the phone.  Medical consult will sign off. Please call us for any questions.

## 2012-07-14 NOTE — Progress Notes (Signed)
[facility] MD Progress Note  07/14/2012 3:52 PM Marilyn Fowler  MRN:  [id number] Subjective:  5-6/10 depression, 9/10 anxiety, concerned about how to afford her medications after discharge--will talk to the social worker regarding this issue, she is overwhelmed at home with a mother who has many health issues and a son with many behavioral issues, reports sleep is "50% better than before I came", appetite still increased Diagnosis:   Axis I: Anxiety Disorder NOS and Major Depression, Recurrent severe Axis II: Deferred Axis III:  Past Medical History  Diagnosis Date  . Migraine    Axis IV: other psychosocial or environmental problems, problems related to social environment and problems with primary support group Axis V: 41-50 serious symptoms  ADL's:  Intact  Sleep: Poor  Appetite:  Good  Suicidal Ideation:  Denies Homicidal Ideation:  Denies  Psychiatric Specialty Exam: Review of Systems  Constitutional: Negative.   HENT: Negative.   Eyes: Negative.   Respiratory: Negative.   Cardiovascular: Negative.   Gastrointestinal: Negative.   Genitourinary: Negative.   Musculoskeletal: Negative.   Skin: Negative.   Neurological: Negative.   Endo/Heme/Allergies: Negative.   Psychiatric/Behavioral: Positive for depression. The patient is nervous/anxious.     Blood pressure 108/76, pulse 83, temperature 97.6 F (36.4 C), temperature source Oral, resp. rate 18, height 5' (1.524 m), weight 107.956 kg (238 lb), last menstrual period 06/23/2012.Body mass index is 46.48 kg/(m^2).  General Appearance: Casual  Eye Contact::  Fair  Speech:  Normal Rate  Volume:  Normal  Mood:  Anxious and Depressed  Affect:  Congruent  Thought Process:  Coherent  Orientation:  Full (Time, Place, and Person)  Thought Content:  WDL  Suicidal Thoughts:  No  Homicidal Thoughts:  No  Memory:  Immediate;   Fair Recent;   Fair Remote;   Fair  Judgement:  Fair  Insight:  Fair  Psychomotor Activity:  Decreased   Concentration:  Fair  Recall:  Fair  Akathisia:  No  Handed:  Right  AIMS (if indicated):     Assets:  Physical Health Resilience  Sleep:  Number of Hours: 6   Current Medications: Current Facility-Administered Medications  Medication Dose Route Frequency Provider Last Rate Last Dose  . acetaminophen (TYLENOL) tablet 650 mg  650 mg Oral Q6H PRN Larena Sox, MD   650 mg at 07/12/12 1502  . alum & mag hydroxide-simeth (MAALOX/MYLANTA) 200-200-20 MG/5ML suspension 30 mL  30 mL Oral Q4H PRN Larena Sox, MD      . amLODipine (NORVASC) tablet 10 mg  10 mg Oral Daily Leroy Sea, MD   10 mg at 07/14/12 0831  . ARIPiprazole (ABILIFY) tablet 5 mg  5 mg Oral Daily Larena Sox, MD   5 mg at 07/13/12 2222  . buPROPion [facility]) tablet 75 mg  75 mg Oral Daily Myelle Poteat, MD   75 mg at 07/14/12 0831  . hydrALAZINE (APRESOLINE) tablet 25 mg  25 mg Oral Q8H Nishant Dhungel, MD   25 mg at 07/14/12 1454  . hydrOXYzine (ATARAX/VISTARIL) tablet 25 mg  25 mg Oral QID PRN Larena Sox, MD   25 mg at 07/14/12 0832  . ibuprofen (ADVIL,MOTRIN) tablet 600 mg  600 mg Oral Q6H PRN Sanjuana Kava, NP   600 mg at 07/13/12 1658  . magnesium hydroxide (MILK OF MAGNESIA) suspension 30 mL  30 mL Oral Daily PRN Larena Sox, MD      . SUMAtriptan (IMITREX) tablet 25 mg  25  mg Oral Q2H PRN Leroy Sea, MD   25 mg at 07/14/12 1515  . topiramate (TOPAMAX) tablet 25 mg  25 mg Oral QHS Larena Sox, MD   25 mg at 07/13/12 2222  . zolpidem (AMBIEN) tablet 5 mg  5 mg Oral QHS PRN Larena Sox, MD   5 mg at 07/13/12 2222    Lab Results: No results found for this or any previous visit (from the past 48 hour(s)).  Physical Findings: AIMS: Facial and Oral Movements Muscles of Facial Expression: None, normal Lips and Perioral Area: None, normal Jaw: None, normal Tongue: None, normal,Extremity Movements Upper (arms, wrists, hands, fingers): None, normal Lower (legs, knees, ankles,  toes): None, normal, Trunk Movements Neck, shoulders, hips: None, normal, Overall Severity Severity of abnormal movements (highest score from questions above): None, normal Incapacitation due to abnormal movements: None, normal Patient's awareness of abnormal movements (rate only patient's report): No Awareness, Dental Status Current problems with teeth and/or dentures?: No Does patient usually wear dentures?: No  CIWA:  CIWA-Ar Total: 1 COWS:  COWS Total Score: 2  Treatment Plan Summary: Daily contact with patient to assess and evaluate symptoms and progress in treatment Medication management  Plan:  Review of chart, vital signs, medications, and notes. 1-Individual and group therapy 2-Medication management for depression and anxiety:  Medications reviewed with the patient and no untoward effects noted 3-Coping skills for depression, anxiety 4-Continue crisis stabilization and management 5-Address health issues--monitoring vital signs, stable--hospitalist is managing her BP medications--discontinued her metropolol today 6-Treatment plan in progress to prevent relapse of depression and anxiety  Medical Decision Making Problem Points:  Established problem, stable/improving (1) and Review of psycho-social stressors (1) Data Points:  Review of medication regiment & side effects (2)  I certify that inpatient services furnished can reasonably be expected to improve the patient's condition.   Nanine Means, PMH-NP 07/14/2012, 3:52 PM

## 2012-07-14 NOTE — Progress Notes (Signed)
D:  Patient's self inventory sheet, patient has fair sleep, good appetite, low energy level, improving attention span.  Rated depression #6, hopelessness #5, anxiety #8.  Denied withdrawals.  Denied SI.  Has experienced pain, dizziness, headaches in past 24 hours.  Zero pain goal today, worst pain #8.  Denied HI.   Denied A/V hallucinations.  Needs financial assistance to purchase medications after discharge. A:  Medications administered per MD order.  Support and encouragement given throughout day. R:  Following treatment plan.  Denied SI and HI.  Denied A/V hallucinations.  Contracts for safety.

## 2012-07-14 NOTE — Progress Notes (Signed)
Date: 07/14/2012   Time: 10:48 AM   Group Topic/Focus:  Patients participated in therapeutic activity where they were asked to identify coping skills they already have. They were then split into teams and played a game of coping skills pictionary. They were asked to identify new coping skills they learned from the activity before the end of group.   Participation Level: Active   Participation Quality: Appropriate   Affect: Appropriate   Cognitive: Appropriate   Insight: Appropriate   Engagement in Group: Engaged   Modes of Intervention: Activity  Additional Comments: Marilyn Fowler reported that a coping skill that works for her is reading.

## 2012-07-14 NOTE — Progress Notes (Signed)
Patient ID: Marilyn Fowler, female   DOB: [birth year], 37 y.o.   MRN: [id number]  D: Pt denies SI/HI/AVH. Pt is pleasant and cooperative. Pt states she was anxious today. Pt says she was upset at another pt for making a comment to her roommate. Pt woke up in the morning saying she had a bad dream and she was having a H/A.  A: Pt was offered support and encouragement. Pt was given scheduled medications. Pt was encourage to attend groups. Q 15 minute checks were done for safety. Pt was given PRN Topamax 2026, Vistaril-2026,524,  Imatrex 524  R:Pt attends groups and interacts well with peers and staff. Pt is taking medication. Pt has no complaints at this time.Pt receptive to treatment and safety maintained on unit.

## 2012-07-14 NOTE — Clinical Social Work Note (Signed)
[facility] LCSW Group Therapy      Feelings About Diagnosis 1:15 - 2:30 PM              07/14/2012   3:33 PM     Type of Therapy:  Group Therapy  Participation Level:  Appropriate  Participation Quality:  Appropriate  Affect:  Appropriate  Cognitive:  Attentive Appropriate  Insight:  Engaged  Engagement in Therapy:  Engaged  Modes of Intervention:  Discussion Exploration Problem-Solving Supportive  Summary of Progress/Problems:  Patient shared having a diagnosis of depression and anxiety causes her to feel weak and not able to take care of others as she needs to 100 percent of the time.  Patient was challenged for thinking she has to been available for others at all times.  Group challenged patient for that line of thinking as well. Wynn Banker 07/14/2012    3:33 PM

## 2012-07-15 MED ORDER — ASPIRIN-ACETAMINOPHEN-CAFFEINE 250-250-65 MG PO TABS
2.0000 | ORAL_TABLET | Freq: Four times a day (QID) | ORAL | Status: DC | PRN
  Administered 2012-07-15 – 2012-07-21 (×5): 2 via ORAL
  Filled 2012-07-15: qty 2

## 2012-07-15 MED ORDER — CLONAZEPAM 0.5 MG PO TABS
ORAL_TABLET | ORAL | Status: AC
  Filled 2012-07-15: qty 1

## 2012-07-15 MED ORDER — CLONAZEPAM 0.5 MG PO TABS
0.5000 mg | ORAL_TABLET | Freq: Once | ORAL | Status: AC
  Administered 2012-07-15: 0.5 mg via ORAL

## 2012-07-15 NOTE — Progress Notes (Signed)
[facility] LCSW Aftercare Discharge Planning Group Note  07/15/2012 12:21 PM  Participation Quality:  Appropriate  Affect:  Appropriate and Depressed  Cognitive:  Appropriate  Insight:  Engaged  Engagement in Group:  Engaged  Modes of Intervention:  Exploration, Problem-solving, Rapport Building and Support  Summary of Progress/Problems:  Patient advised of not doing well today.  She endorsed a migraine headache.  Patient is denying SI/HI and rated depression at five and anxiety at ten.  Wynn Banker 07/15/2012, 12:21 PM

## 2012-07-15 NOTE — Progress Notes (Signed)
Recreation Therapy Notes  Date: 03.19.2014 Time: 3:10pm Location: [facility] Art Room      Group Topic/Focus: Goal Setting  Participation Level: Did not attend   Anab Vivar L Dahiana Kulak, LRT/CTRS        Lakyia Behe L 07/15/2012 4:21 PM 

## 2012-07-15 NOTE — Progress Notes (Signed)
  D) Patient pleasant and cooperative upon my assessment. Patient denies SI/HI, denies A/V hallucinations.   A) Patient offered support and encouragement, patient encouraged to discuss feelings/concerns with staff. Patient verbalized understanding. Patient monitored Q15 minutes for safety. Patient met with MD  to discuss today's goals and plan of care.   R) Patient isolates to room at times, verbalizes "I don't like the bright light in the dayroom." Patient  attending meals in dining room. Patient appropriate with staff and peers.   Patient taking medications as ordered. Will continue to monitor.

## 2012-07-15 NOTE — Progress Notes (Addendum)
Recreation Therapy Notes  Date: 03.19.2014 Time: 2:50pm Location: [address] Day Room      Group Topic/Focus: Musician (AAA/T)  Participation Level: Did not attend    Jearl Klinefelter, LRT/CTRS   Jearl Klinefelter 07/15/2012 4:13 PM

## 2012-07-15 NOTE — Progress Notes (Signed)
Provided brief support for pt in room.  Introduced spiritual care as resource.  Pt spoke about support with family and frustration with migraine.    Will follow pt and assess for support needs.  Please page as needs arise.

## 2012-07-15 NOTE — Progress Notes (Signed)
[facility] MD Progress Note  07/15/2012 11:36 AM Marilyn Fowler  MRN:  [id number] Subjective:  Patient endorsing increased anxiety, severe migraine. Was given 2 doses of sumatriptan, headache has not subsided. Patient states she seems to be tolerating the Wellbutrin okay but has noticed an increase in anxiety. Diagnosis:   Axis I: Major Depression, Recurrent severe Axis II: Deferred Axis III:  Past Medical History  Diagnosis Date  . Migraine    Axis IV: occupational problems and other psychosocial or environmental problems Axis V: 41-50 serious symptoms  ADL's:  Intact  Sleep: Fair  Appetite:  Fair   Psychiatric Specialty Exam: Review of Systems  Constitutional: Negative.   Eyes: Negative.   Respiratory: Negative.   Cardiovascular: Negative.   Gastrointestinal: Negative.   Genitourinary: Negative.   Musculoskeletal: Negative.   Skin: Negative.   Neurological: Positive for headaches.  Endo/Heme/Allergies: Negative.   Psychiatric/Behavioral: Positive for depression. The patient is nervous/anxious.     Blood pressure 124/88, pulse 98, temperature 98.2 F (36.8 C), temperature source Oral, resp. rate 18, height 5' (1.524 m), weight 107.956 kg (238 lb), last menstrual period 06/23/2012.Body mass index is 46.48 kg/(m^2).  General Appearance: Casual  Eye Contact::  Fair  Speech:  Slow  Volume:  Decreased  Mood:  Depressed and Dysphoric  Affect:  Constricted and Flat  Thought Process:  Coherent  Orientation:  Full (Time, Place, and Person)  Thought Content:  WDL  Suicidal Thoughts:  No  Homicidal Thoughts:  No  Memory:  Immediate;   Fair Recent;   Fair Remote;   Fair  Judgement:  Fair  Insight:  Fair  Psychomotor Activity:  Decreased  Concentration:  Fair  Recall:  Fair  Akathisia:  No  Handed:  Right  AIMS (if indicated):     Assets:  Communication Skills Desire for Improvement Housing Social Support  Sleep:  Number of Hours: 4   Current Medications: Current  Facility-Administered Medications  Medication Dose Route Frequency Provider Last Rate Last Dose  . alum & mag hydroxide-simeth (MAALOX/MYLANTA) 200-200-20 MG/5ML suspension 30 mL  30 mL Oral Q4H PRN Larena Sox, MD      . amLODipine (NORVASC) tablet 10 mg  10 mg Oral Daily Leroy Sea, MD   10 mg at 07/15/12 0804  . ARIPiprazole (ABILIFY) tablet 5 mg  5 mg Oral Daily Larena Sox, MD   5 mg at 07/14/12 2250  . aspirin-acetaminophen-caffeine (EXCEDRIN MIGRAINE) per tablet 1 tablet  1 tablet Oral Q6H PRN Julitza Rickles, MD      . buPROPion [facility]) tablet 75 mg  75 mg Oral Daily Juelle Dickmann, MD   75 mg at 07/15/12 0800  . hydrALAZINE (APRESOLINE) tablet 25 mg  25 mg Oral Q8H Nishant Dhungel, MD   25 mg at 07/14/12 2250  . hydrOXYzine (ATARAX/VISTARIL) tablet 25 mg  25 mg Oral QID PRN Larena Sox, MD   25 mg at 07/15/12 0524  . ibuprofen (ADVIL,MOTRIN) tablet 600 mg  600 mg Oral Q6H PRN Sanjuana Kava, NP   600 mg at 07/13/12 1658  . magnesium hydroxide (MILK OF MAGNESIA) suspension 30 mL  30 mL Oral Daily PRN Larena Sox, MD      . SUMAtriptan (IMITREX) tablet 25 mg  25 mg Oral Q2H PRN Leroy Sea, MD   25 mg at 07/15/12 1056  . topiramate (TOPAMAX) tablet 25 mg  25 mg Oral QHS Larena Sox, MD   25 mg at 07/14/12 2250  .  zolpidem (AMBIEN) tablet 5 mg  5 mg Oral QHS PRN Larena Sox, MD   5 mg at 07/14/12 2338    Lab Results: No results found for this or any previous visit (from the past 48 hour(s)).  Physical Findings: AIMS: Facial and Oral Movements Muscles of Facial Expression: None, normal Lips and Perioral Area: None, normal Jaw: None, normal Tongue: None, normal,Extremity Movements Upper (arms, wrists, hands, fingers): None, normal Lower (legs, knees, ankles, toes): None, normal, Trunk Movements Neck, shoulders, hips: None, normal, Overall Severity Severity of abnormal movements (highest score from questions above): None,  normal Incapacitation due to abnormal movements: None, normal Patient's awareness of abnormal movements (rate only patient's report): No Awareness, Dental Status Current problems with teeth and/or dentures?: No Does patient usually wear dentures?: No  CIWA:  CIWA-Ar Total: 1 COWS:  COWS Total Score: 2  Treatment Plan Summary: Daily contact with patient to assess and evaluate symptoms and progress in treatment Medication management  Plan: Give one dose of klonopin 0.5mg  now to target anxiety, start excedrin to augment sumatriptan. Continue bupropion for now, continue to monitor. Encourage attending groups.  Medical Decision Making Problem Points:  Established problem, stable/improving (1), Review of last therapy session (1) and Review of psycho-social stressors (1) Data Points:  Review of medication regiment & side effects (2) Review of new medications or change in dosage (2)  I certify that inpatient services furnished can reasonably be expected to improve the patient's condition.   Timothea Bodenheimer 07/15/2012, 11:36 AM

## 2012-07-15 NOTE — Progress Notes (Signed)
Adult Psychoeducational Group Note  Date:  07/15/2012 Time:  9:31 PM  Group Topic/Focus:  Making Healthy Choices:   The focus of this group is to help patients identify negative/unhealthy choices they were using prior to admission and identify positive/healthier coping strategies to replace them upon discharge.  Participation Level:  Active  Participation Quality:  Supportive  Affect:  Appropriate  Cognitive:  Appropriate  Insight: Appropriate  Engagement in Group:  Supportive  Modes of Intervention:  Exploration  Additional Comments:    Adelina Mings 07/15/2012, 9:31 PM

## 2012-07-15 NOTE — Progress Notes (Signed)
[facility] LCSW Group Therapy  Emotional Regualation  07/15/2012 3:12 PM  Type of Therapy:  Group Therapy  Participation Level:  Did Not Attend  P Wynn Banker 07/15/2012, 3:12 PM

## 2012-07-15 NOTE — Progress Notes (Signed)
Patient ID: Marilyn Fowler, female   DOB: 09-Oct-1975, 37 y.o.   MRN: [id number] D: Pt. Reports pain decreased with last med, rates depression at "6" of 10 and anxiety at "8" of 10. Pt. Reports had visitor for dinner "that made me feel better". Pt. Visible in dayroom interacting with other clients. A: Writer introduced self to client, provided positive reinforcement, encouraged client to continue meds. Staff will monitor q66min for safety. Writer encouraged group. R: Pt. Is safe on the unit. Pt. Attended group.

## 2012-07-16 MED ORDER — BUPROPION HCL 75 MG PO TABS
75.0000 mg | ORAL_TABLET | Freq: Two times a day (BID) | ORAL | Status: DC
  Administered 2012-07-16 – 2012-07-17 (×2): 75 mg via ORAL
  Filled 2012-07-16 (×4): qty 1

## 2012-07-16 NOTE — Progress Notes (Signed)
Patient ID: Marilyn Fowler, female   DOB: November 23, 1975, 37 y.o.   MRN: [id number] D: Pt is awake and active on the unit this PM. Pt denies SI/HI and A/V hallucinations. Pt rates their depression at 7 and hopelessness at 6. Pt writes that her main stressor is "finances." Pt does not believe she is Bipolar because she never has extreme bought's of depression. Pt claims that her current anxiety medication is ineffective and is disappointed because that is her reason for being here.   A: Encouraged pt to discuss feelings with staff and administered medication per MD orders. Writer also encouraged pt to attend groups.  R: Pt is attending groups and tolerating medications well. Writer will continue to monitor. 15 minute checks are ongoing for safety.

## 2012-07-16 NOTE — Progress Notes (Signed)
Adult Psychoeducational Group Note  Date:  07/16/2012 Time:  7:20 PM  Group Topic/Focus:  Overcoming Stress:   The focus of this group is to define stress and help patients assess their triggers.  Participation Level:  Active  Participation Quality:  Appropriate, Attentive and Sharing  Affect:  Appropriate  Cognitive:  Appropriate  Insight: Appropriate  Engagement in Group:  Engaged  Modes of Intervention:  Discussion, Education, Socialization and Support  Additional Comments:  Khamila attended group and shared at times. Patient defined stress in own terms. Afterwards, patient explained what ways of good and bad stress. Patient then, partnered up with a peer and completed the stress interview in workbooks and afterwards shared what partner expressed. Managing stress ideas in workbook was reviewed with patient to complete during self-reflection time.   Karleen Hampshire Brittini 07/16/2012, 7:20 PM

## 2012-07-16 NOTE — Progress Notes (Signed)
Pt attended Karaoke group; did not participate but was supportive of peers.

## 2012-07-16 NOTE — Clinical Social Work Note (Signed)
[facility] LCSW Group Therapy      Feelings About Diagnosis 1:15 - 2:30 PM              07/16/2012   11:28 AM     Type of Therapy:  Group Therapy  Participation Level:  Appropriate  Participation Quality:  Appropriate  Affect:  Appropriate  Cognitive:  Attentive Appropriate  Insight:  Engaged  Engagement in Therapy:  Engaged  Modes of Intervention:  Discussion Exploration Problem-Solving Supportive  Summary of Progress/Problems:  Patient reports feeling better today and denies SI/HI.  She reported having SI yesterday.  Patient rates depression at seven and anxiety at nine.  She asked that Clinical research associate notify employer of hospitalization.    Marilyn Fowler 07/16/2012    11:28 AM

## 2012-07-16 NOTE — Progress Notes (Signed)
[facility] LCSW Group Therapy  07/16/2012 3:14 PM  Type of Therapy:  Group Therapy  Participation Level:  Active  Participation Quality:  Appropriate and Attentive  Affect:  Appropriate  Cognitive:  Alert and Appropriate  Insight:  Engaged  Engagement in Therapy:  Engaged  Modes of Intervention:  Discussion, Education, Exploration, Problem-solving, Rapport Building and Support  Summary of Progress/Problems:  Patient listened attentively to speaker from [employer].  She asked questions about patient's diagnosis and family history.  Patient thanked speaker for presentation.   Wynn Banker 07/16/2012, 3:14 PM

## 2012-07-16 NOTE — Progress Notes (Signed)
  D) Patient pleasant and cooperative upon my assessment. Patient appears bright and animated this morning. Patient verbalizes "I feel so much better today." Patient completed Patient Self Inventory, reports slept "fair," and  appetite is "good." Patient rates depression as  7 /10, patient rates hopeless feelings as 6 /10. Patient now endorsing passive SI, contracts verbally for safety with RN. Patient denies HI, denies A/V hallucinations.   A) Patient offered support and encouragement, patient encouraged to discuss feelings/concerns with staff. Patient verbalized understanding. Patient monitored Q15 minutes for safety. Patient met with MD  to discuss today's goals and plan of care.  R) Patient visible in milieu, attending groups in day room and meals in dining room. Patient appropriate with staff and peers.   Patient taking medications as ordered. Will continue to monitor.

## 2012-07-16 NOTE — Progress Notes (Signed)
[facility] MD Progress Note  07/16/2012 2:05 PM Marilyn Fowler  MRN:  [id number] Subjective:  [name] reports that today her mood is better because of a decrease in migraine symptoms. Her depression was rated as ten yesterday but at five today. Her anxiety is rated as a nine. Sleep quality is getting better. The patient is most concerned about her high anxiety level. Patient stated "I am anxious 24/7." She reports racing thoughts and tells writer that prn vistaril does not help her symptoms. Patient does attribute her headache with elements of stress stating "I think they are just brought on by my stress levels." The patient is attending the scheduled groups and is able to verbalize learned coping skills. Patient stated "After discharge I can try to take walks, practice deep breathing and talk with friends when the stress is building."  Diagnosis:   Axis I: Major Depression, Recurrent severe Axis II: Deferred Axis III:  Past Medical History  Diagnosis Date  . Migraine    Axis IV: other psychosocial or environmental problems and problems with primary support group Axis V: 41-50 serious symptoms  ADL's:  Intact  Sleep: Fair  Appetite:  Good  Suicidal Ideation:  Denies Homicidal Ideation:  Denies AEB (as evidenced by):  Psychiatric Specialty Exam: Review of Systems  Constitutional: Negative.   Eyes: Negative.   Respiratory: Negative.   Cardiovascular: Negative.   Gastrointestinal: Negative.   Genitourinary: Negative.   Musculoskeletal: Negative.   Skin: Negative.   Neurological: Positive for headaches.  Endo/Heme/Allergies: Negative.   Psychiatric/Behavioral: Positive for depression. Negative for suicidal ideas and hallucinations. The patient is nervous/anxious and has insomnia.     Blood pressure 130/98, pulse 121, temperature 97.9 F (36.6 C), temperature source Oral, resp. rate 18, height 5' (1.524 m), weight 107.956 kg (238 lb), last menstrual period 06/23/2012.Body mass index is  46.48 kg/(m^2).  General Appearance: Casual  Eye Contact::  Good  Speech:  Clear and Coherent  Volume:  Normal  Mood:  Anxious  Affect:  Congruent  Thought Process:  Goal Directed and Intact  Orientation:  Full (Time, Place, and Person)  Thought Content:  WDL  Suicidal Thoughts:  No  Homicidal Thoughts:  No  Memory:  Immediate;   Good Recent;   Good Remote;   Good  Judgement:  Fair  Insight:  Fair  Psychomotor Activity:  Normal  Concentration:  Fair  Recall:  Good  Akathisia:  No  Handed:  Right  AIMS (if indicated):     Assets:  Communication Skills Desire for Improvement Leisure Time Physical Health Resilience Social Support Talents/Skills  Sleep:  Number of Hours: 6   Current Medications: Current Facility-Administered Medications  Medication Dose Route Frequency Provider Last Rate Last Dose  . alum & mag hydroxide-simeth (MAALOX/MYLANTA) 200-200-20 MG/5ML suspension 30 mL  30 mL Oral Q4H PRN Larena Sox, MD      . amLODipine (NORVASC) tablet 10 mg  10 mg Oral Daily Leroy Sea, MD   10 mg at 07/16/12 0849  . ARIPiprazole (ABILIFY) tablet 5 mg  5 mg Oral Daily Larena Sox, MD   5 mg at 07/15/12 2132  . aspirin-acetaminophen-caffeine (EXCEDRIN MIGRAINE) per tablet 2 tablet  2 tablet Oral Q6H PRN Patrick North, MD   2 tablet at 07/15/12 1203  . buPROPion [facility]) tablet 75 mg  75 mg Oral BID Karolee Stamps, NP      . hydrALAZINE (APRESOLINE) tablet 25 mg  25 mg Oral Q8H Nishant Dhungel, MD  25 mg at 07/16/12 0646  . hydrOXYzine (ATARAX/VISTARIL) tablet 25 mg  25 mg Oral QID PRN Larena Sox, MD   25 mg at 07/16/12 0646  . ibuprofen (ADVIL,MOTRIN) tablet 600 mg  600 mg Oral Q6H PRN Sanjuana Kava, NP   600 mg at 07/13/12 1658  . magnesium hydroxide (MILK OF MAGNESIA) suspension 30 mL  30 mL Oral Daily PRN Larena Sox, MD      . SUMAtriptan (IMITREX) tablet 25 mg  25 mg Oral Q2H PRN Leroy Sea, MD   25 mg at 07/16/12 1052  . topiramate  (TOPAMAX) tablet 25 mg  25 mg Oral QHS Larena Sox, MD   25 mg at 07/15/12 2131  . zolpidem (AMBIEN) tablet 5 mg  5 mg Oral QHS PRN Larena Sox, MD   5 mg at 07/15/12 2256    Lab Results: No results found for this or any previous visit (from the past 48 hour(s)).  Physical Findings: AIMS: Facial and Oral Movements Muscles of Facial Expression: None, normal Lips and Perioral Area: None, normal Jaw: None, normal Tongue: None, normal,Extremity Movements Upper (arms, wrists, hands, fingers): None, normal Lower (legs, knees, ankles, toes): None, normal, Trunk Movements Neck, shoulders, hips: None, normal, Overall Severity Severity of abnormal movements (highest score from questions above): None, normal Incapacitation due to abnormal movements: None, normal Patient's awareness of abnormal movements (rate only patient's report): No Awareness, Dental Status Current problems with teeth and/or dentures?: No Does patient usually wear dentures?: No  CIWA:  CIWA-Ar Total: 1 COWS:  COWS Total Score: 2  Treatment Plan Summary: Daily contact with patient to assess and evaluate symptoms and progress in treatment Medication management  Plan:Continue crisis management and stabilization.  Medication management: Continue current medication regimen. Patient's Wellbutrin was increased to 75 mg bid.   Encouraged patient to attend groups and participate in group counseling sessions and activities. Reviewed coping skills to help with symptoms of anxiety and depression.  Discharge plan in progress.  Address health issues; Vitals signs reviewed and stable.  Continue current treatment plan.   Medical Decision Making Problem Points:  Established problem, stable/improving (1) Data Points:  Review of new medications or change in dosage (2)  I certify that inpatient services furnished can reasonably be expected to improve the patient's condition.   Fransisca Kaufmann ANN NP-C 07/16/2012, 2:05 PM

## 2012-07-17 MED ORDER — ARIPIPRAZOLE 2 MG PO TABS
2.0000 mg | ORAL_TABLET | Freq: Every day | ORAL | Status: DC
  Administered 2012-07-17 – 2012-07-20 (×4): 2 mg via ORAL
  Filled 2012-07-17 (×6): qty 1

## 2012-07-17 MED ORDER — BUPROPION HCL ER (SR) 100 MG PO TB12
100.0000 mg | ORAL_TABLET | Freq: Every day | ORAL | Status: DC
  Administered 2012-07-18 – 2012-07-20 (×3): 100 mg via ORAL
  Filled 2012-07-17 (×4): qty 1

## 2012-07-17 NOTE — Progress Notes (Signed)
[facility] LCSW Aftercare Discharge Planning Group Note  07/17/2012 10:40 AM  Participation Quality:  Appropriate  Affect:  Appropriate  Cognitive:  Appropriate  Insight:  Engaged  Engagement in Group:  Engaged  Modes of Intervention:  Exploration, Problem-solving, Rapport Building and Support  Summary of Progress/Problems:  Patient advised of not doing well today.  She endorses depression at three and anxiety at ten. Patient believes anxiety is related to change in medication.  Wynn Banker 07/17/2012, 10:40 AM

## 2012-07-17 NOTE — Progress Notes (Signed)
Adult Psychoeducational Group Note  Date:  07/17/2012 Time:  10:57 AM  Group Topic/Focus:  Healthy Communication:   The focus of this group is to discuss communication, barriers to communication, as well as healthy ways to communicate with others.  Participation Level:  Active  Participation Quality:  Appropriate  Affect:  Appropriate  Cognitive:  Alert and Appropriate  Insight: Good  Engagement in Group:  Engaged  Modes of Intervention:  Discussion, Socialization and Support  Additional Comments:  Pts goal for the day is to discharge.  Mansi Tokar T 07/17/2012, 10:57 AM

## 2012-07-17 NOTE — Progress Notes (Signed)
Adult Psychoeducational Group Note  Date:  07/17/2012 Time:  2:52 PM  Group Topic/Focus:  Early Warning Signs:   The focus of this group is to help patients identify signs or symptoms they exhibit before slipping into an unhealthy state or crisis.  Participation Level:  Active  Participation Quality:  Appropriate  Affect:  Appropriate  Cognitive:  Appropriate  Insight: Appropriate  Engagement in Group:  Engaged  Modes of Intervention:  Discussion, Education, Exploration and Support  Additional Comments:   Group discussed that changes which occur in one's life which are the early warning signs of an ensuing crisis, including behavior, attitude, feeling, and thought changes. Group discussed that many of these changes may be noticed by one's loves ones. Pt shared that her early warning signs include lashing out at others, becoming angry easily, and becoming impatient. Staff noted the importance of completing a "Relapse Prevention Plan" in order to address early warning signs effectively in the future and avert crises.   Reinaldo Raddle K 07/17/2012, 2:52 PM

## 2012-07-17 NOTE — Progress Notes (Signed)
Patient ID: Marilyn Fowler, female   DOB: 13-Feb-1976, 37 y.o.   MRN: [id number] D. The patient is bright and pleasant, interacting appropriately in the milieu. Denies any suicidal ideation. Stated that she feels restless and hyper. Expressed fear of not being able to sleep tonight. A. Met with patient 1:1. Verbal support provided. Reviewed medications. Encouraged to attend evening wrap up group. R. The patient is able to verbally contract for safety. Stated that she fears reducing her medication will cause her to feel depressed again. Agrees to keep treatment team informed in any mood changes.

## 2012-07-17 NOTE — Progress Notes (Signed)
Provided continued support with pt in group conversation with peers in courtyard.  Pt spoke with peers about how she was feeling when she arrived, support she has received here, anxieties about discharging, joys about having been at [facility], ways they conceived of their personalities.  Pt identified herself as "mother lion."  Spoke with chaplain about some distress over having to stay weekend due to medication changes.  Was hopeful to see children.   Belva Crome

## 2012-07-17 NOTE — Tx Team (Addendum)
Interdisciplinary Treatment Plan Update   Date Reviewed:  07/17/2012  Time Reviewed:  9:56 AM  Progress in Treatment:   Attending groups: Yes Participating in groups: Yes Taking medication as prescribed: Yes  Tolerating medication: Yes Family/Significant other contact made: Contact made with mother. Patient understands diagnosis: Yes  Discussing patient identified problems/goals with staff: Yes Medical problems stabilized or resolved: Yes Denies suicidal/homicidal ideation: Yes Patient has not harmed self or others: Yes  For review of initial/current patient goals, please see plan of care.  Estimated Length of Stay:  2 days  Reasons for Continued Hospitalization:  Anxiety Depression Medication stabilization  New Problems/Goals identified:    Discharge Plan or Barriers:   Home with outpatient follow up at [facility]  Additional Comments:  Patient is not endorsing SI/HI.  She rates depression at three.  She is rates anxiety at ten and believes it is related to medications.  MD to make adjustment to Wellbrutin to decrease to 100 mg twice daily.  Attendees:  Patient:  Marilyn Fowler 07/17/2012 9:56 AM   Signature: Patrick North, MD 07/17/2012 9:56 AM  Signature: 07/17/2012 9:56 AM  Signature:  Pixie Casino, RN 07/17/2012 9:56 AM  Signature: Harold Barban, RN 07/17/2012 9:56 AM  Signature:   07/17/2012 9:56 AM  Signature:  Juline Patch, LCSW 07/17/2012 9:56 AM  Signature: Silverio Decamp, PMH-NP 07/17/2012 9:56 AM  Signature:  Tomasita Morrow, BSW 07/17/2012 9:56 AM  Signature: 07/17/2012 9:56 AM  Signature:    Signature:    Signature:      Scribe for Treatment Team:   Juline Patch,  07/17/2012 9:56 AM

## 2012-07-17 NOTE — Progress Notes (Signed)
[facility] MD Progress Note  07/17/2012 12:20 PM Marilyn Fowler  MRN:  [id number] Subjective:  Patient is reporting feeling very energetic, having racing thoughts. Mood significantly improved but feels somewhat giddy. She has not felt like this before. Had interrupted sleep last night. Reporting increased anxiety.  Diagnosis:   Axis I: Major Depression, Recurrent severe Axis II: Deferred Axis III:  Past Medical History  Diagnosis Date  . Migraine    Axis IV: other psychosocial or environmental problems Axis V: 41-50 serious symptoms  ADL's:  Impaired  Sleep: Poor  Appetite:  Fair   Psychiatric Specialty Exam: Review of Systems  Constitutional: Negative.   HENT: Negative.   Eyes: Negative.   Respiratory: Negative.   Cardiovascular: Negative.   Gastrointestinal: Negative.   Genitourinary: Negative.   Musculoskeletal: Negative.   Skin: Negative.   Neurological: Negative.   Endo/Heme/Allergies: Negative.   Psychiatric/Behavioral: The patient is nervous/anxious and has insomnia.     Blood pressure 136/92, pulse 125, temperature 98.3 F (36.8 C), temperature source Oral, resp. rate 20, height 5' (1.524 m), weight 107.956 kg (238 lb), last menstrual period 06/23/2012.Body mass index is 46.48 kg/(m^2).  General Appearance: Casual  Eye Contact::  Fair  Speech:  Clear and Coherent  Volume:  Increased  Mood:  Euphoric  Affect:  Labile  Thought Process:  Coherent  Orientation:  Full (Time, Place, and Person)  Thought Content:  WDL  Suicidal Thoughts:  No  Homicidal Thoughts:  No  Memory:  Immediate;   Fair Recent;   Fair Remote;   Fair  Judgement:  Fair  Insight:  Fair  Psychomotor Activity:  Increased  Concentration:  Fair  Recall:  Fair  Akathisia:  No  Handed:  Right  AIMS (if indicated):     Assets:  Communication Skills Desire for Improvement Housing Social Support  Sleep:  Number of Hours: 6   Current Medications: Current Facility-Administered Medications   Medication Dose Route Frequency Provider Last Rate Last Dose  . alum & mag hydroxide-simeth (MAALOX/MYLANTA) 200-200-20 MG/5ML suspension 30 mL  30 mL Oral Q4H PRN Larena Sox, MD      . amLODipine (NORVASC) tablet 10 mg  10 mg Oral Daily Leroy Sea, MD   10 mg at 07/17/12 8657  . ARIPiprazole (ABILIFY) tablet 2 mg  2 mg Oral Daily Zachariah Pavek, MD      . aspirin-acetaminophen-caffeine (EXCEDRIN MIGRAINE) per tablet 2 tablet  2 tablet Oral Q6H PRN Patrick North, MD   2 tablet at 07/15/12 1203  . [START ON 07/18/2012] buPROPion (WELLBUTRIN SR) 12 hr tablet 100 mg  100 mg Oral Daily Shawnique Mariotti, MD      . hydrALAZINE (APRESOLINE) tablet 25 mg  25 mg Oral Q8H Nishant Dhungel, MD   25 mg at 07/17/12 0641  . hydrOXYzine (ATARAX/VISTARIL) tablet 25 mg  25 mg Oral QID PRN Larena Sox, MD   25 mg at 07/17/12 0931  . ibuprofen (ADVIL,MOTRIN) tablet 600 mg  600 mg Oral Q6H PRN Sanjuana Kava, NP   600 mg at 07/13/12 1658  . magnesium hydroxide (MILK OF MAGNESIA) suspension 30 mL  30 mL Oral Daily PRN Larena Sox, MD      . SUMAtriptan (IMITREX) tablet 25 mg  25 mg Oral Q2H PRN Leroy Sea, MD   25 mg at 07/16/12 1052  . topiramate (TOPAMAX) tablet 25 mg  25 mg Oral QHS Larena Sox, MD   25 mg at 07/16/12 2200  .  zolpidem (AMBIEN) tablet 5 mg  5 mg Oral QHS PRN Larena Sox, MD   5 mg at 07/16/12 2300    Lab Results: No results found for this or any previous visit (from the past 48 hour(s)).  Physical Findings: AIMS: Facial and Oral Movements Muscles of Facial Expression: None, normal Lips and Perioral Area: None, normal Jaw: None, normal Tongue: None, normal,Extremity Movements Upper (arms, wrists, hands, fingers): None, normal Lower (legs, knees, ankles, toes): None, normal, Trunk Movements Neck, shoulders, hips: None, normal, Overall Severity Severity of abnormal movements (highest score from questions above): None, normal Incapacitation due to abnormal  movements: None, normal Patient's awareness of abnormal movements (rate only patient's report): No Awareness, Dental Status Current problems with teeth and/or dentures?: No Does patient usually wear dentures?: No  CIWA:  CIWA-Ar Total: 1 COWS:  COWS Total Score: 2  Treatment Plan Summary: Daily contact with patient to assess and evaluate symptoms and progress in treatment Medication management  Plan: It appears patient is having hypomania from the increased dose of bupropion. Will decrease bupropion to 100mg  daily. Decrease abilify to 2mg  daily. Continue to monitor through weekend.  Medical Decision Making Problem Points:  Established problem, stable/improving (1), Review of last therapy session (1) and Review of psycho-social stressors (1) Data Points:  Review of medication regiment & side effects (2) Review of new medications or change in dosage (2)  I certify that inpatient services furnished can reasonably be expected to improve the patient's condition.   Emmanuel Ercole 07/17/2012, 12:20 PM

## 2012-07-17 NOTE — Progress Notes (Signed)
[facility] Group Notes:  (Nursing/MHT/Case Management/Adjunct)  Date:  07/17/2012  Time:  2000  Type of Therapy:  Psychoeducational Skills  Participation Level:  Active  Participation Quality:  Attentive  Affect:  Appropriate  Cognitive:  Appropriate  Insight:  Good  Engagement in Group:  Engaged  Modes of Intervention:  Education  Summary of Progress/Problems: The patient shared in group that she was unhappy about the fact that her discharge was cancelled. She  feels that this was carried out due to the side effects of her medication. She is complaining of having difficulty sleeping at night. Her affect improved when she shared that her son came in to visit her this evening. Her goal for tomorrow is to get more rest, be "less hyper", and try to balance her mood.    Hazle Coca S 07/17/2012, 11:40 PM

## 2012-07-17 NOTE — Progress Notes (Signed)
[facility] LCSW Group Therapy  Feelings Around Relapse 1:15 -2:30 PM  07/17/2012 3:54 PM  Type of Therapy:  Group Therapy  Participation Level:  Active  Participation Quality:  Appropriate and Attentive  Affect:  Appropriate  Cognitive:  Alert and Appropriate  Insight:  Engaged  Engagement in Therapy:  Engaged  Modes of Intervention:  Discussion, Education, Exploration, Problem-solving, Rapport Building and Support  Summary of Progress/Problems:  Patient advised taking on too much would be relapsing.  She shared she has asked family to help share the responsibilities and that if these family members do not follow through, she will ask for help from others.   Wynn Banker 07/17/2012, 3:54 PM

## 2012-07-17 NOTE — Progress Notes (Signed)
Patient ID: Marilyn Fowler, female   DOB: [birth year], 37 y.o.   MRN: [id number] D: Pt. Lying in bed eyes closed, resp. Even. A: Pt. Will be monitored q55min for safety. R: Pt. Is safe on the unit, no distress noted.

## 2012-07-18 NOTE — Progress Notes (Signed)
Patient ID: Marilyn Fowler, female   DOB: [birth year], 37 y.o.   MRN: [id number] D. The patient is bright and social, interacting appropriately in the milieu. Stated she was still feeling very "up" and expressed concern about sleeping tonight. Stated that she got less then three hours the night before. Does not want to stop taking the Wellbutrin because other than not sleeping, this is the best she has felt in a long time. A. Met with patient 1:1. Discussed strategies for obtaining better sleep including eliminating caffeinated beverages in the evening. Encouraged to attend evening wrap up group. Reviewed and administered HS medications.  R. Attended and actively participated in evening group. Observed drinking soda with caffeine. Compliant with medications.

## 2012-07-18 NOTE — Progress Notes (Signed)
Goals Group Note  Date:  03/33/2014 Time: 0915   Group Topic/Focus:  Identifying Goals  The focus of this group is to help patients identify  Personal goals they want to strive for, it orients them to the Saturday Patient Workbook and it help0s motivate them to begin to make positive changes in their lives.  Participation Level:  Active  Participation Quality: good  Affect: flat  Cognitive good :    Insight:  good  Engagement in Group: engaged  Additional Comments:    PD RN [facility]

## 2012-07-18 NOTE — Clinical Social Work Note (Signed)
[facility] Group Notes:  (Clinical Social Work)  07/18/2012   3:00-4:00PM  Summary of Progress/Problems:   The main focus of today's process group was for the patient to identify something in their life that led to their hospitalization that they would like to change, then to discuss their motivation to change.    The patient expressed that she wants to be able to sleep again.  She stated her medications have been changed, and whereas she slept for the first few nights she was in the hospital, now she is not sleeping.  She feels manic.  She wants to be able to say no and be assertive.  Type of Therapy:  Process Group  Participation Level:  Active  Participation Quality:  Appropriate, Attentive, Sharing and Supportive  Affect:  Anxious and Blunted  Cognitive:  Alert, Appropriate and Oriented  Insight:  Developing/Improving  Engagement in Therapy:  Engaged  Modes of Intervention:  Clarification, Support and Processing, Exploration, Discussion   Ambrose Mantle, LCSW 07/18/2012, 4:47 PM

## 2012-07-18 NOTE — Progress Notes (Signed)
[facility] MD Progress Note  07/18/2012 3:49 PM Marilyn Fowler  MRN:  [id number] Subjective:  Marilyn Fowler continues to report having a high level of anxiety. She does report that her depression has decreased to a three or four. Denies SI or HI. Patient complains of feeling very hyper and restless. Patient stated "I feel like the wellbutrin is helping my depression. I am worried the MD will take me off it." Marilyn Fowler talks a great deal about how this particular medication has helped many members of her family. Patient requests to have various medications increased or be given xanax for a time until her anxiety decreases. Patient stated "I feel that after two weeks of being on wellbutrin it will improve." She did enjoy a visit from her oldest son yesterday.  Diagnosis:   Axis I: Major Depression, Recurrent severe Axis II: Deferred Axis III:  Past Medical History  Diagnosis Date  . Migraine    Axis IV: other psychosocial or environmental problems Axis V: 41-50 serious symptoms  ADL's:  Intact  Sleep: Poor  Appetite:  Fair  Suicidal Ideation:  Denies Homicidal Ideation:  Denies AEB (as evidenced by):  Psychiatric Specialty Exam: Review of Systems  Constitutional: Negative.   Eyes: Negative.   Respiratory: Negative.   Cardiovascular: Negative.   Gastrointestinal: Negative.   Genitourinary: Negative.   Musculoskeletal: Negative.   Skin: Negative.   Neurological: Positive for headaches (Described as mild. ).  Endo/Heme/Allergies: Negative.   Psychiatric/Behavioral: Positive for depression and hallucinations. Negative for suicidal ideas, memory loss and substance abuse. The patient is nervous/anxious and has insomnia.     Blood pressure 136/89, pulse 121, temperature 98 F (36.7 C), temperature source Oral, resp. rate 16, height 5' (1.524 m), weight 107.956 kg (238 lb), last menstrual period 06/23/2012.Body mass index is 46.48 kg/(m^2).  General Appearance: Casual and Fairly Groomed  Proofreader::  Good  Speech:  Clear and Coherent  Volume:  Normal  Mood:  Anxious and Irritable  Affect:  Labile  Thought Process:  Goal Directed  Orientation:  Full (Time, Place, and Person)  Thought Content:  Rumination  Suicidal Thoughts:  No  Homicidal Thoughts:  No  Memory:  Immediate;   Good Recent;   Good Remote;   Good  Judgement:  Fair  Insight:  Fair  Psychomotor Activity:  Normal  Concentration:  Fair  Recall:  Good  Akathisia:  No  Handed:  Right  AIMS (if indicated):     Assets:  Communication Skills Desire for Improvement Financial Resources/Insurance Housing Intimacy Leisure Time Physical Health Resilience Social Support  Sleep:  Number of Hours: 3.5   Current Medications: Current Facility-Administered Medications  Medication Dose Route Frequency Provider Last Rate Last Dose  . alum & mag hydroxide-simeth (MAALOX/MYLANTA) 200-200-20 MG/5ML suspension 30 mL  30 mL Oral Q4H PRN Larena Sox, MD      . amLODipine (NORVASC) tablet 10 mg  10 mg Oral Daily Leroy Sea, MD   10 mg at 07/18/12 0803  . ARIPiprazole (ABILIFY) tablet 2 mg  2 mg Oral Daily Himabindu Ravi, MD   2 mg at 07/17/12 2210  . aspirin-acetaminophen-caffeine (EXCEDRIN MIGRAINE) per tablet 2 tablet  2 tablet Oral Q6H PRN Patrick North, MD   2 tablet at 07/18/12 0112  . buPROPion (WELLBUTRIN SR) 12 hr tablet 100 mg  100 mg Oral Daily Himabindu Ravi, MD   100 mg at 07/18/12 0803  . hydrALAZINE (APRESOLINE) tablet 25 mg  25 mg Oral Q8H Nishant Dhungel,  MD   25 mg at 07/18/12 0616  . hydrOXYzine (ATARAX/VISTARIL) tablet 25 mg  25 mg Oral QID PRN Larena Sox, MD   25 mg at 07/18/12 1155  . ibuprofen (ADVIL,MOTRIN) tablet 600 mg  600 mg Oral Q6H PRN Sanjuana Kava, NP   600 mg at 07/13/12 1658  . magnesium hydroxide (MILK OF MAGNESIA) suspension 30 mL  30 mL Oral Daily PRN Larena Sox, MD      . SUMAtriptan (IMITREX) tablet 25 mg  25 mg Oral Q2H PRN Leroy Sea, MD   25 mg at  07/16/12 1052  . topiramate (TOPAMAX) tablet 25 mg  25 mg Oral QHS Larena Sox, MD   25 mg at 07/17/12 2210  . zolpidem (AMBIEN) tablet 5 mg  5 mg Oral QHS PRN Larena Sox, MD   5 mg at 07/18/12 0108    Lab Results: No results found for this or any previous visit (from the past 48 hour(s)).  Physical Findings: AIMS: Facial and Oral Movements Muscles of Facial Expression: None, normal Lips and Perioral Area: None, normal Jaw: None, normal Tongue: None, normal,Extremity Movements Upper (arms, wrists, hands, fingers): None, normal Lower (legs, knees, ankles, toes): None, normal, Trunk Movements Neck, shoulders, hips: None, normal, Overall Severity Severity of abnormal movements (highest score from questions above): None, normal Incapacitation due to abnormal movements: None, normal Patient's awareness of abnormal movements (rate only patient's report): No Awareness, Dental Status Current problems with teeth and/or dentures?: No Does patient usually wear dentures?: No  CIWA:  CIWA-Ar Total: 1 COWS:  COWS Total Score: 2  Treatment Plan Summary: Daily contact with patient to assess and evaluate symptoms and progress in treatment Medication management  Plan: Continue crisis management and stabilization.  Medication management: Reviewed with patient. Continue Wellbutrin 100 mg daily. Educated about rationale for specific medications. Patient encouraged to utilize coping skills for anxiety as much as possible.   Encouraged patient to attend groups and participate in group counseling sessions and activities.  Discharge plan in progress.  Address health issues: Vitals reviewed and stable.  Continue current treatment plan.   Medical Decision Making Problem Points:  Established problem, stable/improving (1) and Review of psycho-social stressors (1) Data Points:  Review of medication regiment & side effects (2)  I certify that inpatient services furnished can reasonably be expected  to improve the patient's condition.   Fransisca Kaufmann ANN NP-C 07/18/2012, 3:49 PM

## 2012-07-18 NOTE — Progress Notes (Signed)
Psychoeducational Group Note  Date:  07/18/2012 Time:  0915  Group Topic/Focus:  Identifying Needs:   The focus of this group is to help patients identify their personal needs that have been historically problematic and identify healthy behaviors to address their needs.  Participation Level:  active Participation Quality: good Affect: flat Cognitive:  good  Insight:  good  Engagement in Group: engaged  Additional Comments:   PD RN [facility] 

## 2012-07-18 NOTE — Progress Notes (Signed)
[name] remains anxious, acutely aware and verbalizing that she is " way up there today". She shares that she cont to experience diff sleeping, that she can't stop thinking about the way the wellbutrin has made her feel  And how worried she is that the dr is going to take her off of it due to her contd insomnia.   A She cont to attend her groups, is engaged in her recovery and  Interacts with her peers appropriately. SHe completed her self inventory this morning and on it she wrote she denied SI with in the  Past 24 hrs, she rated her depression and hopelessness" 6 / 3 " and stated her DC plan is : " still hyper, didn't sleep, more depressed".   R Safety is in place and POC cont with staff continuing to work with pt on developing healtheir skills.

## 2012-07-19 MED ORDER — ZOLPIDEM TARTRATE 10 MG PO TABS
10.0000 mg | ORAL_TABLET | Freq: Every evening | ORAL | Status: DC | PRN
  Administered 2012-07-19 – 2012-07-20 (×2): 10 mg via ORAL
  Filled 2012-07-19 (×2): qty 1

## 2012-07-19 MED ORDER — ZOLPIDEM TARTRATE 10 MG PO TABS: 10.0000 mg | ORAL_TABLET | Freq: Every evening | ORAL | Status: DC | PRN

## 2012-07-19 NOTE — Progress Notes (Signed)
Psychoeducational Group Note  Date:  07/19/2012 Time: 1015 Group Topic/Focus:  Making Healthy Choices:   The focus of this group is to help patients identify negative/unhealthy choices they were using prior to admission and identify positive/healthier coping strategies to replace them upon discharge.  Participation Level:  Active  Participation Quality:  Appropriate  Affect:  Appropriate  Cognitive:  Appropriate  Insight:  Engaged  Engagement in Group:  Engaged  Additional Comments:    Rich Brave 7:01 PM. 07/19/2012

## 2012-07-19 NOTE — Clinical Social Work Note (Signed)
[facility] Group Notes:  (Clinical Social Work)  07/19/2012   3:00-4:00PM  Summary of Progress/Problems:    The main focus of today's process group was to define "support" and describe what healthy supports are.  We then discussed how and why to increase patient supports, using motivational interviewing.  An emphasis was placed on using counselor, doctor, therapy groups, self-help groups and problem-specific support groups to expand supports.   The patient expressed understanding of need for supports, talked about how she has supported others in her lifetime, and how it feels good.  Said she can allow others to feel good by asking them for help also.  More quiet than usual in group, which the rest of the group was as well.  Type of Therapy:  Process Group  Participation Level:  Active  Participation Quality:  Appropriate, Attentive and Sharing  Affect:  Appropriate  Cognitive:  Alert, Appropriate and Oriented  Insight:  Engaged  Engagement in Therapy:  Engaged  Modes of Intervention:  Education,  Teacher, English as a foreign language, Exploration, Discussion   Ambrose Mantle, LCSW 07/19/2012, 4:05 PM

## 2012-07-19 NOTE — Progress Notes (Signed)
Marilyn Fowler remains hypomanic, anxious and nervous about not sleeping . Staff reports she slept a total of 2.5 hrs

## 2012-07-19 NOTE — Progress Notes (Signed)
[facility] MD Progress Note  07/19/2012 1:40 PM Marilyn Fowler  MRN:  [id number] Subjective:  Marilyn Fowler is visible and active on the unit today. She reports "being happier than I have been in a long time." She complains of ongoing racing thoughts. Patient is very concerned about her sleep stating "I haven't had any quality sleep in at least three nights. I take the ambien but then wake up a few hours after. I think I would feel better if my sleep could improve." The patient rates her depression at three and feeling anxious at ten. Also noticed that her appetite has declined over the week.  Diagnosis:   Axis I: Major Depression, Recurrent severe Axis II: Deferred Axis III:  Past Medical History  Diagnosis Date  . Migraine    Axis IV: other psychosocial or environmental problems and problems with primary support group Axis V: 51-60 moderate symptoms  ADL's:  Intact  Sleep: Poor  Appetite:  Fair  Suicidal Ideation:  Denies Homicidal Ideation:  Denies AEB (as evidenced by):  Psychiatric Specialty Exam: Review of Systems  Constitutional: Negative.   Eyes: Negative.   Respiratory: Negative.   Cardiovascular: Negative.   Gastrointestinal: Negative.   Genitourinary: Negative.   Musculoskeletal: Negative.   Skin: Negative.   Neurological: Positive for headaches (Complains of dull headache that patient feels could be related to stress. ).  Endo/Heme/Allergies: Negative.   Psychiatric/Behavioral: Positive for depression and hallucinations. Negative for suicidal ideas, memory loss and substance abuse. The patient is nervous/anxious and has insomnia.     Blood pressure 134/97, pulse 116, temperature 98.6 F (37 C), temperature source Oral, resp. rate 24, height 5' (1.524 m), weight 115.214 kg (254 lb), last menstrual period 06/23/2012.Body mass index is 49.61 kg/(m^2).  General Appearance: Casual  Eye Contact::  Good  Speech:  Clear and Coherent  Volume:  Normal  Mood:  Anxious  Affect:   Appropriate  Thought Process:  Goal Directed and Intact  Orientation:  Full (Time, Place, and Person)  Thought Content:  WDL  Suicidal Thoughts:  No  Homicidal Thoughts:  No  Memory:  Immediate;   Good Recent;   Good Remote;   Good  Judgement:  Good  Insight:  Present  Psychomotor Activity:  Normal  Concentration:  Fair  Recall:  Good  Akathisia:  No  Handed:  Right  AIMS (if indicated):     Assets:  Communication Skills Desire for Improvement Intimacy Leisure Time Physical Health Resilience Social Support  Sleep:  Number of Hours: 3   Current Medications: Current Facility-Administered Medications  Medication Dose Route Frequency Provider Last Rate Last Dose  . alum & mag hydroxide-simeth (MAALOX/MYLANTA) 200-200-20 MG/5ML suspension 30 mL  30 mL Oral Q4H PRN Larena Sox, MD      . amLODipine (NORVASC) tablet 10 mg  10 mg Oral Daily Leroy Sea, MD   10 mg at 07/19/12 0854  . ARIPiprazole (ABILIFY) tablet 2 mg  2 mg Oral Daily Himabindu Ravi, MD   2 mg at 07/18/12 2214  . aspirin-acetaminophen-caffeine (EXCEDRIN MIGRAINE) per tablet 2 tablet  2 tablet Oral Q6H PRN Patrick North, MD   2 tablet at 07/19/12 0618  . buPROPion (WELLBUTRIN SR) 12 hr tablet 100 mg  100 mg Oral Daily Himabindu Ravi, MD   100 mg at 07/19/12 0854  . hydrALAZINE (APRESOLINE) tablet 25 mg  25 mg Oral Q8H Nishant Dhungel, MD   25 mg at 07/19/12 1303  . hydrOXYzine (ATARAX/VISTARIL) tablet 25 mg  25 mg Oral QID PRN Larena Sox, MD   25 mg at 07/19/12 0406  . ibuprofen (ADVIL,MOTRIN) tablet 600 mg  600 mg Oral Q6H PRN Sanjuana Kava, NP   600 mg at 07/13/12 1658  . magnesium hydroxide (MILK OF MAGNESIA) suspension 30 mL  30 mL Oral Daily PRN Larena Sox, MD      . SUMAtriptan (IMITREX) tablet 25 mg  25 mg Oral Q2H PRN Leroy Sea, MD   25 mg at 07/16/12 1052  . topiramate (TOPAMAX) tablet 25 mg  25 mg Oral QHS Larena Sox, MD   25 mg at 07/18/12 2214  . zolpidem (AMBIEN) tablet  10 mg  10 mg Oral QHS PRN Karolee Stamps, NP        Lab Results: No results found for this or any previous visit (from the past 48 hour(s)).  Physical Findings: AIMS: Facial and Oral Movements Muscles of Facial Expression: None, normal Lips and Perioral Area: None, normal Jaw: None, normal Tongue: None, normal,Extremity Movements Upper (arms, wrists, hands, fingers): None, normal Lower (legs, knees, ankles, toes): None, normal, Trunk Movements Neck, shoulders, hips: None, normal, Overall Severity Severity of abnormal movements (highest score from questions above): None, normal Incapacitation due to abnormal movements: None, normal Patient's awareness of abnormal movements (rate only patient's report): No Awareness, Dental Status Current problems with teeth and/or dentures?: No Does patient usually wear dentures?: No  CIWA:  CIWA-Ar Total: 1 COWS:  COWS Total Score: 2  Treatment Plan Summary: Daily contact with patient to assess and evaluate symptoms and progress in treatment Medication management  Plan: Continue crisis management and stabilization.  Medication management: Reviewed, Ambien increased to 10 mg at hs to address patient complaint of insomnia.  Encouraged patient to attend groups and participate in group counseling sessions and activities. Discussed with patient importance of using coping skills to help with anxious symptoms.  Address health issues: Vitals reviewed and stable.  Discharge plan in progress.  Continue current treatment plan.   Medical Decision Making Problem Points:  Established problem, stable/improving (1) Data Points:  Review of medication regiment & side effects (2) Review of new medications or change in dosage (2)  I certify that inpatient services furnished can reasonably be expected to improve the patient's condition.   Fransisca Kaufmann ANN NP-C 07/19/2012, 1:40 PM

## 2012-07-19 NOTE — Progress Notes (Signed)
[facility] Group Notes:  (Nursing/MHT/Case Management/Adjunct)  Date:  07/19/2012  Time:  2000 Type of Therapy:  Psychoeducational Skills  Participation Level:  Active  Participation Quality:  Attentive  Affect:  Blunted  Cognitive:  Appropriate  Insight:  Good  Engagement in Group:  Developing/Improving  Modes of Intervention:  Education  Summary of Progress/Problems: The patient verbalized that her depression had decreased today. In addition, she mentioned that she laughed a great deal today. She also mentioned that her anxiety and energy level were both high today which was very similar to what she shared in group yesterday. Her goal for tomorrow is to get more rest and to feel less hyper.   Marilyn Fowler S 07/19/2012, 12:07 AM

## 2012-07-20 MED ORDER — TRAZODONE HCL 100 MG PO TABS
100.0000 mg | ORAL_TABLET | ORAL | Status: AC
  Administered 2012-07-20: 100 mg via ORAL
  Filled 2012-07-20 (×2): qty 1

## 2012-07-20 MED ORDER — BUPROPION HCL 75 MG PO TABS
37.5000 mg | ORAL_TABLET | Freq: Every day | ORAL | Status: DC
  Administered 2012-07-20 – 2012-07-21 (×2): 37.5 mg via ORAL
  Filled 2012-07-20 (×3): qty 0.5

## 2012-07-20 NOTE — Progress Notes (Signed)
Adult Psychoeducational Group Note  Date:  07/20/2012 Time:  9:40 PM  Group Topic/Focus:  Making Healthy Choices:   The focus of this group is to help patients identify negative/unhealthy choices they were using prior to admission and identify positive/healthier coping strategies to replace them upon discharge.  Participation Level:  Active  Participation Quality:  Appropriate  Affect:  Appropriate  Cognitive:  Appropriate  Insight: Appropriate  Engagement in Group:  Supportive  Modes of Intervention:  Support  Additional Comments:    Adelina Mings 07/20/2012, 9:40 PM

## 2012-07-20 NOTE — Progress Notes (Signed)
D:  Patient's self inventory sheet, patient has poor sleep, improving appetite, hyper energy level, poor attention span.  Rated depression #2, hopelessness #1, anxiety #10.  Denied withdrawals.  Denied SI.  Has experienced pain, dizziness, headaches in past 24 hours.  Zero pain goal, worst pain #4.  After discharge, plans to eat more, eat better, exercise.  Patient feels she is still hyper, trouble sleeping.  Slept better last night.   Denied HI.  Denied A/V hallucinations.  Does have discharge plans.  Will have problems taking medications after discharge. A:  Medications administered per MD order.  Support and encouragement given throughout day. R:  Following treatment plan.  Denied SI and HI.  Denied A/V hallucinations.  Contracts for safety.

## 2012-07-20 NOTE — Progress Notes (Signed)
Adult Psychoeducational Group Note  Date:  07/20/2012 Time:  12:06 PM  Group Topic/Focus:  Wellness Toolbox:   The focus of this group is to discuss various aspects of wellness, balancing those aspects and exploring ways to increase the ability to experience wellness.  Patients will create a wellness toolbox for use upon discharge.  Participation Level:  Active  Participation Quality:  Appropriate, Attentive and Sharing  Affect:  Appropriate  Cognitive:  Alert and Appropriate  Insight: Appropriate  Engagement in Group:  Engaged  Modes of Intervention:  Discussion  Additional Comments:  Pt was appropriate and sharing while attending. Pt shared that her family and a stuff animal made by her daughter is something that brings her comfort. Pt also shared that meditation is something that he wants to try in the future.   Sharyn Lull 07/20/2012, 12:06 PM

## 2012-07-20 NOTE — Tx Team (Signed)
Interdisciplinary Treatment Plan Update   Date Reviewed:  07/20/2012  Time Reviewed:  10:44 AM  Progress in Treatment:   Attending groups: Yes Participating in groups: Yes Taking medication as prescribed: Yes  Tolerating medication: Yes Family/Significant other contact made: Contact made with mother. Patient understands diagnosis: Yes  Discussing patient identified problems/goals with staff: Yes Medical problems stabilized or resolved: Yes Denies suicidal/homicidal ideation: Yes Patient has not harmed self or others: Yes  For review of initial/current patient goals, please see plan of care.  Estimated Length of Stay:  2 days  Reasons for Continued Hospitalization:  Anxiety Depression Medication stabilization  New Problems/Goals identified:    Discharge Plan or Barriers:   Home with outpatient follow up at [facility]  Additional Comments:  Patient is not endorsing SI/HI.  She rates depression at two.  She continues to rates anxiety at ten.   MD to decrease Wellbutrin to 37.5 mg daily.  Attendees:  Patient:  Marilyn Fowler 07/20/2012 10:44 AM   Signature: Patrick North, MD 07/20/2012 10:44 AM  Signature: 07/20/2012 10:44 AM  Signature:  Quintella Reichert, RN  07/20/2012 10:44 AM  Signature: 07/20/2012 10:44 AM  Signature:   07/20/2012 10:44 AM  Signature:  Juline Patch, LCSW 07/20/2012 10:44 AM  Signature:  07/20/2012 10:44 AM  Signature:  07/20/2012 10:44 AM  Signature: 07/20/2012 10:44 AM  Signature:    Signature:    Signature:      Scribe for Treatment Team:   Juline Patch,  07/20/2012 10:44 AM

## 2012-07-20 NOTE — Progress Notes (Signed)
[facility] LCSW Group Therapy  Overcoming Obstacles 1:15 -2:30 PM  07/20/2012 4:18 PM  Type of Therapy:  Group Therapy  Participation Level:  Active  Participation Quality:  Appropriate and Attentive  Affect:  Appropriate  Cognitive:  Alert and Appropriate  Insight:  Engaged  Engagement in Therapy:  Engaged  Modes of Intervention:  Discussion, Education, Exploration, Problem-solving, Rapport Building and Support  Summary of Progress/Problems:  Patient shared she has been her own obstacle by taking on too much.   She shared she has asked family to help share the responsibilities and they are already working to make things better for her at discharge.  Wynn Banker 07/20/2012, 4:18 PM

## 2012-07-20 NOTE — Progress Notes (Signed)
[facility] LCSW Group Therapy   Overcoming Obstacles 1:15 -2:30 PM  07/20/2012 1:03 PM  Type of Therapy:  Group Therapy  Participation Level:  Active  Participation Quality:  Appropriate and Attentive  Affect:  Appropriate  Cognitive:  Alert and Appropriate  Insight:  Engaged  Engagement in Therapy:  Engaged  Modes of Intervention:  Discussion, Education, Exploration, Problem-solving, Rapport Building and Support  Summary of Progress/Problems:  Patient advised taking on too much would be relapsing.  She shared she has asked family to help share the responsibilities and that if these family members do not follow through, she will ask for help from others.   Wynn Banker 07/20/2012, 1:03 PM

## 2012-07-20 NOTE — Progress Notes (Signed)
Brief follow up support and connection in 500 hallway  

## 2012-07-20 NOTE — Progress Notes (Signed)
[facility] LCSW Aftercare Discharge Planning Group Note  07/20/2012 10:57 AM  Participation Quality:  Appropriate  Affect:  Appropriate  Cognitive:  Appropriate  Insight:  Engaged  Engagement in Group:  Engaged  Modes of Intervention:  Exploration, Problem-solving, Rapport Building and Support  Summary of Progress/Problems:  Patient advised being much today but continues to have high anxiety.  She rates depression at two and anxiety at ten. She also advised of not sleeping well last night.  Daily workbook provided.  Wynn Banker 07/20/2012, 10:57 AM

## 2012-07-20 NOTE — Progress Notes (Signed)
[facility] MD Progress Note  07/20/2012 11:34 AM Marilyn Fowler  MRN:  [id number] Subjective:  Patient continues to feel hyper, mood very good. States she is more calmer than a day ago, but continues to feel somewhat giddy. Complaining of high anxiety, barely able to sleep.  Diagnosis:   Axis I: Major Depression, Recurrent severe Axis II: Deferred Axis III:  Past Medical History  Diagnosis Date  . Migraine    Axis IV: occupational problems and other psychosocial or environmental problems Axis V: 41-50 serious symptoms  ADL's:  Intact  Sleep: Poor  Appetite:  Fair   Psychiatric Specialty Exam: Review of Systems  Constitutional: Negative.   HENT: Negative.   Eyes: Negative.   Respiratory: Negative.   Cardiovascular: Negative.        High pulse rate  Gastrointestinal: Negative.   Genitourinary: Negative.   Musculoskeletal: Negative.   Skin: Negative.   Neurological: Negative.   Endo/Heme/Allergies: Negative.   Psychiatric/Behavioral: The patient is nervous/anxious and has insomnia.     Blood pressure 130/94, pulse 134, temperature 98.2 F (36.8 C), temperature source Oral, resp. rate 18, height 5' (1.524 m), weight 115.214 kg (254 lb), last menstrual period 06/23/2012.Body mass index is 49.61 kg/(m^2).  General Appearance: Casual  Eye Contact::  Fair  Speech:  Pressured  Volume:  Normal  Mood:  Euphoric  Affect:  Congruent  Thought Process:  Coherent  Orientation:  Full (Time, Place, and Person)  Thought Content:  WDL  Suicidal Thoughts:  No  Homicidal Thoughts:  No  Memory:  Immediate;   Fair Recent;   Fair Remote;   Fair  Judgement:  Fair  Insight:  Fair  Psychomotor Activity:  Normal  Concentration:  Fair  Recall:  Fair  Akathisia:  No  Handed:  Right  AIMS (if indicated):     Assets:  Communication Skills Desire for Improvement Housing Social Support  Sleep:  Number of Hours: 5.25   Current Medications: Current Facility-Administered Medications   Medication Dose Route Frequency Provider Last Rate Last Dose  . alum & mag hydroxide-simeth (MAALOX/MYLANTA) 200-200-20 MG/5ML suspension 30 mL  30 mL Oral Q4H PRN Larena Sox, MD      . amLODipine (NORVASC) tablet 10 mg  10 mg Oral Daily Leroy Sea, MD   10 mg at 07/20/12 0834  . ARIPiprazole (ABILIFY) tablet 2 mg  2 mg Oral Daily Rheagan Nayak, MD   2 mg at 07/19/12 2158  . aspirin-acetaminophen-caffeine (EXCEDRIN MIGRAINE) per tablet 2 tablet  2 tablet Oral Q6H PRN Patrick North, MD   2 tablet at 07/19/12 0618  . buPROPion [facility]) tablet 37.5 mg  37.5 mg Oral q morning - 10a Rima Blizzard, MD      . hydrALAZINE (APRESOLINE) tablet 25 mg  25 mg Oral Q8H Nishant Dhungel, MD   25 mg at 07/20/12 0617  . hydrOXYzine (ATARAX/VISTARIL) tablet 25 mg  25 mg Oral QID PRN Larena Sox, MD   25 mg at 07/20/12 0835  . ibuprofen (ADVIL,MOTRIN) tablet 600 mg  600 mg Oral Q6H PRN Sanjuana Kava, NP   600 mg at 07/13/12 1658  . magnesium hydroxide (MILK OF MAGNESIA) suspension 30 mL  30 mL Oral Daily PRN Larena Sox, MD      . SUMAtriptan (IMITREX) tablet 25 mg  25 mg Oral Q2H PRN Leroy Sea, MD   25 mg at 07/19/12 2110  . topiramate (TOPAMAX) tablet 25 mg  25 mg Oral QHS Shaji J Puthuvel,  MD   25 mg at 07/19/12 2158  . zolpidem (AMBIEN) tablet 10 mg  10 mg Oral QHS PRN Karolee Stamps, NP   10 mg at 07/19/12 2158    Lab Results: No results found for this or any previous visit (from the past 48 hour(s)).  Physical Findings: AIMS: Facial and Oral Movements Muscles of Facial Expression: None, normal Lips and Perioral Area: None, normal Jaw: None, normal Tongue: None, normal,Extremity Movements Upper (arms, wrists, hands, fingers): None, normal Lower (legs, knees, ankles, toes): None, normal, Trunk Movements Neck, shoulders, hips: None, normal, Overall Severity Severity of abnormal movements (highest score from questions above): None, normal Incapacitation due to abnormal  movements: None, normal Patient's awareness of abnormal movements (rate only patient's report): No Awareness, Dental Status Current problems with teeth and/or dentures?: No Does patient usually wear dentures?: No  CIWA:  CIWA-Ar Total: 1 COWS:  COWS Total Score: 2  Treatment Plan Summary: Daily contact with patient to assess and evaluate symptoms and progress in treatment Medication management  Plan: Decrease Bupropion to 37.5mg  Continue to monitor pulse, patient does not have any complaints at this time. Plan for discharge tomorrow if stable. Medical Decision Making Problem Points:  Established problem, stable/improving (1), Review of last therapy session (1) and Review of psycho-social stressors (1) Data Points:  Review of medication regiment & side effects (2) Review of new medications or change in dosage (2)  I certify that inpatient services furnished can reasonably be expected to improve the patient's condition.   Nicolis Boody 07/20/2012, 11:34 AM

## 2012-07-20 NOTE — Progress Notes (Signed)
Recreation Therapy Notes  Date: 03.24.2014 Time: 3:00pm Location: [address] Day Room      Group Topic/Focus: Leisure Education  Participation Level: Active  Participation Quality: Appropriate  Affect: Flat  Cognitive: Oriented   Additional Comments: Patient with peers played Health and Wellness Trivia game. Patient placed on team with two peers. Patient and peers named themselves team "Popcorn." Patient with peers successfully answered questions from the following categories: Fruits, Fitness, Veggies, Exercises, Producer, television/film/video. Patient stated a benefit of health and wellness: "I'm going to start cooling stuff more natural" "Lose weight."Patient stated a benefit of exercise: "Sleep Better." Patient participated in wrap up discussion about health and wellness. Patient complained of having a migraine headache.   Marykay Lex Margean Korell, LRT/CTRS  Jearl Klinefelter 07/20/2012 3:49 PM

## 2012-07-20 NOTE — Progress Notes (Signed)
Patient ID: Marilyn Fowler, female   DOB: 17-Aug-1975, 37 y.o.   MRN: [id number] D. The patient had a neutral mood and affect. Stated that she felt a little less energized this evening. Speech was not as rapid as it had been yesterday. She wasn't sure if it was the medication adjusting or if it was from not sleeping for three nights in a row. She is hoping to be discharged tomorrow.  A. Met with the patient 1:1. Discussed discharge plans. Reviewed strategy for better, longer sleep. Administered HS medication. R. The patient attended and participated in evening wrap up group. Compliant with medication.

## 2012-07-20 NOTE — Progress Notes (Signed)
[facility] Group Notes:  (Nursing/MHT/Case Management/Adjunct)  Date:  07/19/2012 Time:  2000  Type of Therapy:  Psychoeducational Skills  Participation Level:  Active  Participation Quality:  Attentive  Affect:  Appropriate  Cognitive:  Appropriate  Insight:  Improving  Engagement in Group:  Engaged  Modes of Intervention:  Education  Summary of Progress/Problems: The patient expressed in group this evening that she felt a little bit less hyper than yesterday. She continues to complain of having difficulty sleeping. She is however optimistic that her new medication regimen will be more effective. Her goal for tomorrow is to get more rest.   Aynslee Mulhall S 07/20/2012, 12:55 AM

## 2012-07-21 MED ORDER — BUPROPION HCL 75 MG PO TABS
75.0000 mg | ORAL_TABLET | Freq: Every day | ORAL | Status: DC
  Filled 2012-07-21: qty 14

## 2012-07-21 MED ORDER — ZOLPIDEM TARTRATE 10 MG PO TABS: 10.0000 mg | ORAL_TABLET | Freq: Every evening | ORAL | Status: DC | PRN

## 2012-07-21 MED ORDER — HYDROXYZINE HCL 25 MG PO TABS: 25.0000 mg | ORAL_TABLET | Freq: Four times a day (QID) | ORAL | Status: DC | PRN

## 2012-07-21 MED ORDER — TOPIRAMATE 25 MG PO TABS
50.0000 mg | ORAL_TABLET | Freq: Every day | ORAL | Status: DC
  Filled 2012-07-21: qty 28

## 2012-07-21 MED ORDER — TOPIRAMATE 25 MG PO TABS: 50.0000 mg | ORAL_TABLET | Freq: Every day | ORAL | Status: DC

## 2012-07-21 MED ORDER — HYDRALAZINE HCL 25 MG PO TABS: 25.0000 mg | ORAL_TABLET | Freq: Three times a day (TID) | ORAL | Status: DC

## 2012-07-21 MED ORDER — AMLODIPINE BESYLATE 10 MG PO TABS: 10.0000 mg | ORAL_TABLET | Freq: Every day | ORAL | Status: DC

## 2012-07-21 MED ORDER — BUPROPION HCL 75 MG PO TABS: 75.0000 mg | ORAL_TABLET | Freq: Every day | ORAL | Status: DC

## 2012-07-21 MED ORDER — SUMATRIPTAN SUCCINATE 25 MG PO TABS: 25.0000 mg | ORAL_TABLET | ORAL | Status: DC | PRN

## 2012-07-21 MED ORDER — PROPRANOLOL HCL 10 MG PO TABS: 10.0000 mg | ORAL_TABLET | Freq: Three times a day (TID) | ORAL | Status: DC

## 2012-07-21 MED ORDER — PROPRANOLOL HCL 10 MG PO TABS
10.0000 mg | ORAL_TABLET | Freq: Three times a day (TID) | ORAL | Status: DC
  Administered 2012-07-21: 10 mg via ORAL
  Filled 2012-07-21 (×3): qty 1

## 2012-07-21 NOTE — Discharge Summary (Signed)
Physician Discharge Summary Note  Patient:  Marilyn Fowler is an 37 y.o., female MRN:  [id number] DOB:  1975-09-08 Patient phone:  [phone] (home)  Patient address:   [address] [place] [address],   Date of Admission:  07/11/2012 Date of Discharge: 07/21/2012  Reason for Admission:  Depression with suicidal ideations  Discharge Diagnoses: Active Problems:   Bipolar II disorder, most recent episode major depressive   HTN (hypertension)   Migraine  Review of Systems  Constitutional: Negative.   HENT: Negative.   Eyes: Negative.   Respiratory: Negative.   Cardiovascular: Negative.   Gastrointestinal: Negative.   Genitourinary: Negative.   Musculoskeletal: Negative.   Skin: Negative.   Neurological: Negative.   Endo/Heme/Allergies: Negative.   Psychiatric/Behavioral: Positive for depression. The patient is nervous/anxious.    Axis Diagnosis:   AXIS I:  Anxiety Disorder NOS and Major Depression, Recurrent severe AXIS II:  Deferred AXIS III:   Past Medical History  Diagnosis Date  . Migraine    AXIS IV:  other psychosocial or environmental problems, problems related to social environment and problems with primary support group AXIS V:  61-70 mild symptoms  Level of Care:  OP  Hospital Course:  On admission:  Marilyn Fowler is a 37 y/o woman with a past psychiatric history significant for depression. She states she has been on antidepressants while she was 37 y/o while seen by a psychiatrist for about 9 months. She states she was "stubborn" and didn't want to get help. She reports she has been having a "shorter fuse" and has been snapping at people. She reports a history of Bipolar disorder in her family and reports she has never had a manic phase. She reports her life issues and living with her mother is her main stressors.  During hospitalization, Marilyn Fowler's medications were managed as follows:  Wellbutrin started and regulated based on her anxiety, 75 mg at this time  for her depression, her Abilify was discontinued, Vistaril 25 mg was given for her anxiety, Topamax 25mg  increased to 50 mg for mood stability and seizures, Ambien for sleep was increased to 10 mg, Imitrex for her migraines, and blood pressure medications monitored by the hospitalist--Norvasc 10 mg and hydralazine 25 mg every 8 hours.  Inderal 10 mg TID was added for anxiety, headaches, and elevated heart rate.  Marilyn Fowler attended and actively participated in [place] will ask family members for help and stop doing it all herself, depression and anxiety decreased, sleep is four hours which is a big improvement for 0, her manic type behavior has dissipated.  Patient denied suicidal/homicidal ideations and auditory/visual hallucinations, follow-up appointments encouraged to attend, Rx and 14 day supply of medications given at discharge.  Marilyn Fowler is mentally and physically stable for discharge.  Consults:  Hospitalist for blood pressure elevations  Significant Diagnostic Studies:  labs: Completed and reviewed, stable  Discharge Vitals:   Blood pressure 138/87, pulse 113, temperature 97.4 F (36.3 C), temperature source Oral, resp. rate 18, height 5' (1.524 m), weight 115.214 kg (254 lb), last menstrual period 06/23/2012. Body mass index is 49.61 kg/(m^2). Lab Results:   No results found for this or any previous visit (from the past 72 hour(s)).  Physical Findings: AIMS: Facial and Oral Movements Muscles of Facial Expression: None, normal Lips and Perioral Area: None, normal Jaw: None, normal Tongue: None, normal,Extremity Movements Upper (arms, wrists, hands, fingers): None, normal Lower (legs, knees, ankles, toes): None, normal, Trunk Movements Neck, shoulders, hips: None, normal, Overall Severity Severity of  abnormal movements (highest score from questions above): None, normal Incapacitation due to abnormal movements: None, normal Patient's awareness of abnormal movements (rate only patient's  report): No Awareness, Dental Status Current problems with teeth and/or dentures?: No Does patient usually wear dentures?: No  CIWA:  CIWA-Ar Total: 0 COWS:  COWS Total Score: 2  Psychiatric Specialty Exam: See Psychiatric Specialty Exam and Suicide Risk Assessment completed by Attending Physician prior to discharge.  Discharge destination:  Home  Is patient on multiple antipsychotic therapies at discharge:  No   Has Patient had three or more failed trials of antipsychotic monotherapy by history:  No Recommended Plan for Multiple Antipsychotic Therapies:  N/A  Discharge Orders   Future Orders Complete By Expires     Activity as tolerated - No restrictions  As directed     Diet - low sodium heart healthy  As directed         Medication List    STOP taking these medications       acetaminophen 500 MG tablet  Commonly known as:  TYLENOL     AMBIEN CR 6.25 MG CR tablet  Generic drug:  zolpidem     GOODYS EXTRA STRENGTH 500-325-65 MG Pack  Generic drug:  Aspirin-Acetaminophen-Caffeine     naproxen 500 MG tablet  Commonly known as:  NAPROSYN      TAKE these medications     Indication   amLODipine 10 MG tablet  Commonly known as:  NORVASC  Take 1 tablet (10 mg total) by mouth daily.   Indication:  High Blood Pressure     buPROPion 75 MG tablet  Commonly known as:  WELLBUTRIN  Take 1 tablet (75 mg total) by mouth daily.   Indication:  Major Depressive Disorder     hydrALAZINE 25 MG tablet  Commonly known as:  APRESOLINE  Take 1 tablet (25 mg total) by mouth every 8 (eight) hours.   Indication:  High Blood Pressure     hydrOXYzine 25 MG tablet  Commonly known as:  ATARAX/VISTARIL  Take 1 tablet (25 mg total) by mouth 4 (four) times daily as needed for anxiety.      SUMAtriptan 25 MG tablet  Commonly known as:  IMITREX  Take 1 tablet (25 mg total) by mouth every 2 (two) hours as needed.   Indication:  Migraine Headache     topiramate 25 MG tablet  Commonly  known as:  TOPAMAX  Take 2 tablets (50 mg total) by mouth at bedtime.   Indication:  Seizures     zolpidem 10 MG tablet  Commonly known as:  AMBIEN  Take 1 tablet (10 mg total) by mouth at bedtime as needed for sleep.            Follow-up Information   Follow up with [facility] On 07/21/2012. (Please go to [facility]'s walk in clinic on Monday, July 20, 2012 between 8AM-3PM for medication.  Please let them know you want to be seen by a therapist as well.)    Contact information:   [address] [place], [place]   [address]  [phone]      Follow-up recommendations:  Activity:  As tolerated Diet:  Low-sodium heart healthy diet  Comments:  Patient will continue her care at [facility]  Total Discharge Time:  Greater than 30 minutes.  SignedNanine Means, PMH-NP 07/21/2012, 11:03 AM

## 2012-07-21 NOTE — Progress Notes (Signed)
Patient discharged per physician order; patient denies SI/HI and A/V hallucinations; patient received samples, prescriptions, and copy of AVS after it was reviewed; patient had no other questions or concerns at this time; patient verbalized and signed that he received all his belongings; patient left the unit ambulatory with her mother

## 2012-07-21 NOTE — Progress Notes (Signed)
[facility] LCSW Aftercare Discharge Planning Group Note  07/21/2012 12:59 PM  Participation Quality:  Appropriate and Attentive  Affect:  Appropriate  Cognitive:  Disorganized  Insight:  Engaged  Engagement in Group:  Engaged  Modes of Intervention:  Education, Exploration, Dentist, Rapport Building and Support  Summary of Progress/Problems:  Patient reports doing well today and ready for discharge.  She denies SI/HI and rates depression at five and anxiety at eight.  She advised she believes depression increased due to change in anxiety medications.  Patient asked for a letter to provide to employer.  Daily workbook provided.   Wynn Banker 07/21/2012, 12:59 PM
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
## 2012-07-21 NOTE — Progress Notes (Signed)
[facility] Adult Case Management Discharge Plan :  Will you be returning to the same living situation after discharge: Yes,  Patient is returning to her home At discharge, do you have transportation home?:Yes,  Patient is arranging transporation home. Do you have the ability to pay for your medications:No.  Patient to be assisted with indigent medications.  Release of information consent forms completed and in the chart;  Patient's signature needed at discharge.  Patient to Follow up at: Follow-up Information   Follow up with [facility] On 07/22/2012. (Please go to [facility]'s walk in clinic on Wednesday, July 22, 2012 between 8AM-3PM for medication.  Please let them know you want to be seen by a therapist as well.)    Contact information:   [address] [place], [place]   [address]  [phone]      Patient denies SI/HI:   Yes,  Patient is not endorsing SI/HI or thoughts of self harm.    Safety Planning and Suicide Prevention discussed:  Yes,  Reviewed during aftercare groups.  Wynn Banker 07/21/2012, 1:01 PM

## 2012-07-21 NOTE — [facility] Suicide Risk Assessment (Signed)
Suicide Risk Assessment  Discharge Assessment     Demographic Factors:  Female, Caucasian,   Mental Status Per Nursing Assessment::   On Admission:     Current Mental Status by Physician: Patient alert and oriented to 4. Denies AH/VH/SI/HI.  Loss Factors: Decrease in vocational status and Financial problems/change in socioeconomic status  Historical Factors: Family history of mental illness or substance abuse  Risk Reduction Factors:   Responsible for children under 62 years of age, Sense of responsibility to family, Living with another person, especially a relative, Positive social support and Positive coping skills or problem solving skills  Continued Clinical Symptoms:  Depression:   Recent sense of peace/wellbeing  Cognitive Features That Contribute To Risk:  Cognitively intact  Suicide Risk:  Minimal: No identifiable suicidal ideation.  Patients presenting with no risk factors but with morbid ruminations; may be classified as minimal risk based on the severity of the depressive symptoms  Discharge Diagnoses:   AXIS I:  Major Depression, Recurrent severe AXIS II:  Deferred AXIS III:   Past Medical History  Diagnosis Date  . Migraine    AXIS IV:  occupational problems and other psychosocial or environmental problems AXIS V:  61-70 mild symptoms  Plan Of Care/Follow-up recommendations:  Activity:  as tolerated Diet:  regular Follow up with outpatient appointments.  Is patient on multiple antipsychotic therapies at discharge:  No   Has Patient had three or more failed trials of antipsychotic monotherapy by history:  No  Recommended Plan for Multiple Antipsychotic Therapies: Na  Lesleyann Fichter 07/21/2012, 10:09 AM

## 2012-07-21 NOTE — Progress Notes (Signed)
Pt reports she continues to have a headache.  She was given meds just prior to shift change.  She says she is doing ok otherwise.  She may be discharged Tuesday if she is stable.  She denies SI/HI/AV.  She makes her needs known to staff.  Pt states she attends groups and participates.  Support/encouragement given.  Safety maintained with q15 minute checks.

## 2012-07-22 ENCOUNTER — Encounter (HOSPITAL_COMMUNITY): Payer: Self-pay

## 2012-07-24 NOTE — Progress Notes (Signed)
Patient Discharge Instructions:  After Visit Summary (AVS):   Faxed to:  07/24/12 Discharge Summary Note:   Faxed to:  07/24/12 Psychiatric Admission Assessment Note:   Faxed to:  07/24/12 Suicide Risk Assessment - Discharge Assessment:   Faxed to:  07/24/12 Faxed/Sent to the Next Level Care provider:  07/24/12 Faxed to [facility] @ [phone]  Jerelene Redden, 07/24/2012, 4:06 PM

## 2012-09-28 ENCOUNTER — Ambulatory Visit (INDEPENDENT_AMBULATORY_CARE_PROVIDER_SITE_OTHER): Payer: Managed Care, Other (non HMO) | Admitting: Family Medicine

## 2012-09-28 VITALS — BP 140/90 | HR 89 | Temp 98.5°F | Resp 20 | Ht 60.0 in | Wt 237.0 lb

## 2012-09-28 DIAGNOSIS — S39012A Strain of muscle, fascia and tendon of lower back, initial encounter: Secondary | ICD-10-CM

## 2012-09-28 DIAGNOSIS — M545 Low back pain, unspecified: Secondary | ICD-10-CM

## 2012-09-28 DIAGNOSIS — IMO0002 Reserved for concepts with insufficient information to code with codable children: Secondary | ICD-10-CM

## 2012-09-28 MED ORDER — METHOCARBAMOL 500 MG PO TABS: 500.0000 mg | ORAL_TABLET | Freq: Four times a day (QID) | ORAL | Status: DC

## 2012-09-28 NOTE — Patient Instructions (Addendum)
Use the muscle relaxer as needed, but be cautious of sedation. Let me know if you are not feeling better in the next few days- Sooner if worse.

## 2012-09-28 NOTE — Progress Notes (Signed)
[facility] [address], [place] [address] [phone]  Date:  09/28/2012   Name:  Marilyn Fowler   DOB:  November 28, 1975   MRN:  [id number]  PCP:  [name], MD    Chief Complaint: Back Pain   History of Present Illness:  Marilyn Fowler is a 37 y.o. very pleasant female patient who presents with the following:  3 days ago she pulled a muscle in her back.  She was at the pool and her 38 year old son went to jump in- she bent to grab him and felt something pull/ pop in her back.  She has pain with bending over and some spasms, she is not sleeping very well She is taking ibuprofen.  However, the spasms keep her up at night.  She would like a muscle relaxer.  She feels better with standing/ walking- worse with sitting especially for a prolonged period.   She has a history of upper back pain which she attributed to large breasts- however no other particular hisotory of back pain.   No leg weakness, numbness or incontinence issues LMP- now  She was hospitalized for her mental health earlier this year, but is now doing better.    Patient Active Problem List   Diagnosis Date Noted  . HTN (hypertension) 07/12/2012  . Migraine 07/12/2012  . Bipolar II disorder, most recent episode major depressive 07/11/2012    Past Medical History  Diagnosis Date  . Hypertension     gestational  . Hemorrhoid   . Migraine   . Anemia   . Depression   . Anxiety   . Blood transfusion without reported diagnosis     Past Surgical History  Procedure Laterality Date  . Ventral hernia repair    . Hemorrhoid surgery      Oct 2011  . Tonsillectomy    . Hernia repair    . Hemorrhoid surgery      History  Substance Use Topics  . Smoking status: Former Smoker -- 10 years    Types: Cigarettes  . Smokeless tobacco: Not on file  . Alcohol Use: Yes     Comment: socially    Family History  Problem Relation Age of Onset  . Osteoarthritis Mother   . Diabetes Mother    . Hypertension Mother   . Depression Mother   . Hypertension Father   . Diabetes Father   . Asthma Son     No Known Allergies  Medication list has been reviewed and updated.  Current Outpatient Prescriptions on File Prior to Visit  Medication Sig Dispense Refill  . amLODipine (NORVASC) 10 MG tablet Take 1 tablet (10 mg total) by mouth daily.  30 tablet  0  . buPROPion (WELLBUTRIN) 75 MG tablet Take 1 tablet (75 mg total) by mouth daily.  30 tablet  0  . hydrOXYzine (ATARAX/VISTARIL) 25 MG tablet Take 1 tablet (25 mg total) by mouth 4 (four) times daily as needed for anxiety.  60 tablet  0  . propranolol (INDERAL) 10 MG tablet Take 1 tablet (10 mg total) by mouth 3 (three) times daily.  100 tablet  0  . acetaminophen (TYLENOL) 325 MG tablet Take 325 mg by mouth every 6 (six) hours as needed for pain. Pain      . hydrALAZINE (APRESOLINE) 25 MG tablet Take 1 tablet (25 mg total) by mouth every 8 (eight) hours.  90 tablet  0  . ibuprofen (ADVIL,MOTRIN) 200 MG tablet  Take 200 mg by mouth every 6 (six) hours as needed. Pain      . naproxen (NAPROSYN) 500 MG tablet Take 1 tablet (500 mg total) by mouth 2 (two) times daily.  30 tablet  0  . SUMAtriptan (IMITREX) 25 MG tablet Take 1 tablet (25 mg total) by mouth every 2 (two) hours as needed.  10 tablet  0  . topiramate (TOPAMAX) 25 MG tablet Take 2 tablets (50 mg total) by mouth at bedtime.  60 tablet  0  . zolpidem (AMBIEN CR) 6.25 MG CR tablet Take 1 tablet (6.25 mg total) by mouth at bedtime as needed for sleep.  12 tablet  0  . zolpidem (AMBIEN) 10 MG tablet Take 1 tablet (10 mg total) by mouth at bedtime as needed for sleep.  7 tablet  0   No current facility-administered medications on file prior to visit.    Review of Systems:  As per HPI- otherwise negative.   Physical Examination: Filed Vitals:   09/28/12 1154  BP: 140/90  Pulse: 89  Temp: 98.5 F (36.9 C)  Resp: 20   Filed Vitals:   09/28/12 1154  Height: 5' (1.524 m)   Weight: 237 lb (107.502 kg)   Body mass index is 46.29 kg/(m^2). Ideal Body Weight: Weight in (lb) to have BMI = 25: 127.7  GEN: WDWN, NAD, Non-toxic, A & O x 3, obese HEENT: Atraumatic, Normocephalic. Neck supple. No masses, No LAD. Ears and Nose: No external deformity. CV: RRR, No M/G/R. No JVD. No thrill. No extra heart sounds. PULM: CTA B, no wheezes, crackles, rhonchi. No retractions. No resp. distress. No accessory muscle use. ABD: S, NT, ND She has mild tenderness in the muscles on the right side of her lower back.  Negative SLR, normal LE strength, sensation and DTR bilaterally.  No bony tenderness to palpation EXTR: No c/c/e NEURO Normal gait.  PSYCH: Normally interactive. Conversant. Not depressed or anxious appearing.  Calm demeanor.    Assessment and Plan: Back strain, initial encounter - Plan: methocarbamol (ROBAXIN) 500 MG tablet  Lower back pain  Robaxin as needed for muscle spasm.  Reminded her this medication can cause sedation, especially with her abilify.  She will let me know if not better in the next few days- Sooner if worse.     Signed Abbe Amsterdam, MD

## 2012-09-29 ENCOUNTER — Ambulatory Visit (INDEPENDENT_AMBULATORY_CARE_PROVIDER_SITE_OTHER): Payer: Managed Care, Other (non HMO) | Admitting: Family Medicine

## 2012-09-29 ENCOUNTER — Encounter: Payer: Self-pay | Admitting: Family Medicine

## 2012-09-29 VITALS — BP 110/86 | HR 74 | Temp 98.8°F | Ht 60.25 in | Wt 239.2 lb

## 2012-09-29 DIAGNOSIS — I10 Essential (primary) hypertension: Secondary | ICD-10-CM

## 2012-09-29 DIAGNOSIS — F3181 Bipolar II disorder: Secondary | ICD-10-CM

## 2012-09-29 DIAGNOSIS — F3189 Other bipolar disorder: Secondary | ICD-10-CM

## 2012-09-29 LAB — CBC WITH DIFFERENTIAL/PLATELET
Basophils Absolute: 0 10*3/uL (ref 0.0–0.1)
Eosinophils Absolute: 0.1 10*3/uL (ref 0.0–0.7)
Lymphocytes Relative: 36.8 % (ref 12.0–46.0)
MCHC: 33.6 g/dL (ref 30.0–36.0)
Monocytes Relative: 10.4 % (ref 3.0–12.0)
Neutro Abs: 4.3 10*3/uL (ref 1.4–7.7)
Platelets: 316 10*3/uL (ref 150.0–400.0)
RDW: 15 % — ABNORMAL HIGH (ref 11.5–14.6)

## 2012-09-29 LAB — HEPATIC FUNCTION PANEL
ALT: 16 U/L (ref 0–35)
Alkaline Phosphatase: 67 U/L (ref 39–117)
Bilirubin, Direct: 0 mg/dL (ref 0.0–0.3)
Total Bilirubin: 0.4 mg/dL (ref 0.3–1.2)
Total Protein: 7.3 g/dL (ref 6.0–8.3)

## 2012-09-29 LAB — LIPID PANEL
Cholesterol: 176 mg/dL (ref 0–200)
LDL Cholesterol: 114 mg/dL — ABNORMAL HIGH (ref 0–99)
Total CHOL/HDL Ratio: 6
Triglycerides: 153 mg/dL — ABNORMAL HIGH (ref 0.0–149.0)
VLDL: 30.6 mg/dL (ref 0.0–40.0)

## 2012-09-29 LAB — BASIC METABOLIC PANEL
CO2: 27 mEq/L (ref 19–32)
Calcium: 9.2 mg/dL (ref 8.4–10.5)
Chloride: 104 mEq/L (ref 96–112)
Creatinine, Ser: 0.9 mg/dL (ref 0.4–1.2)
Sodium: 136 mEq/L (ref 135–145)

## 2012-09-29 MED ORDER — AMLODIPINE BESYLATE 10 MG PO TABS: 10.0000 mg | ORAL_TABLET | Freq: Every day | ORAL | Status: DC

## 2012-09-29 MED ORDER — PROPRANOLOL HCL 10 MG PO TABS: 10.0000 mg | ORAL_TABLET | Freq: Three times a day (TID) | ORAL | Status: DC

## 2012-09-29 MED ORDER — PHENTERMINE HCL 37.5 MG PO CAPS: 37.5000 mg | ORAL_CAPSULE | ORAL | Status: DC

## 2012-09-29 MED ORDER — HYDRALAZINE HCL 25 MG PO TABS: 25.0000 mg | ORAL_TABLET | Freq: Three times a day (TID) | ORAL | Status: DC

## 2012-09-29 NOTE — Progress Notes (Signed)
  Subjective:    Patient ID: Marilyn Fowler, female    DOB: 06/28/1975, 37 y.o.   MRN: [id number]  HPI New to establish.  Previous MD- none recently.  HTN- dx'd w/ HTN while in Behavioral health in April.  At time of dx had HA.  Was started on Norvasc, Hydralazine, inderal.  Pt feels most of this is due to her weight.  Pt wants help to lose weight- not interested in surgery.  Wants appetite suppressant.  Previously on stimulant for weight loss- lost 100 lbs and BP improved.  + HAs, lethargy.  No CP, SOB, visual changes, edema.  Bipolar- has appt upcoming w/ [facility].  On Abilify and Wellbutrin.     Review of Systems For ROS see HPI     Objective:   Physical Exam  Vitals reviewed. Constitutional: She is oriented to person, place, and time. She appears well-developed and well-nourished. No distress.  Morbid obesity  HENT:  Head: Normocephalic and atraumatic.  Eyes: Conjunctivae and EOM are normal. Pupils are equal, round, and reactive to light.  Neck: Normal range of motion. Neck supple. No thyromegaly present.  Cardiovascular: Normal rate, regular rhythm, normal heart sounds and intact distal pulses.   No murmur heard. Pulmonary/Chest: Effort normal and breath sounds normal. No respiratory distress.  Abdominal: Soft. She exhibits no distension. There is no tenderness.  Musculoskeletal: She exhibits no edema.  Lymphadenopathy:    She has no cervical adenopathy.  Neurological: She is alert and oriented to person, place, and time.  Skin: Skin is warm and dry.  Psychiatric: She has a normal mood and affect. Her behavior is normal.          Assessment & Plan:

## 2012-09-29 NOTE — Patient Instructions (Addendum)
Follow up as schedule We'll notify you of your lab results and make any changes if needed Start the phentermine daily for the weight loss Try and get the regular exercise and make healthy food choices- you can do this!!! Call with any questions or concerns Hang in there! Welcome!  We're glad to have you!

## 2012-10-02 ENCOUNTER — Ambulatory Visit (INDEPENDENT_AMBULATORY_CARE_PROVIDER_SITE_OTHER): Payer: Managed Care, Other (non HMO) | Admitting: Family Medicine

## 2012-10-02 ENCOUNTER — Encounter: Payer: Self-pay | Admitting: Family Medicine

## 2012-10-02 ENCOUNTER — Telehealth: Payer: Self-pay | Admitting: Family Medicine

## 2012-10-02 VITALS — BP 124/82 | HR 80 | Temp 98.2°F | Wt 231.2 lb

## 2012-10-02 DIAGNOSIS — M545 Low back pain: Secondary | ICD-10-CM

## 2012-10-02 MED ORDER — CYCLOBENZAPRINE HCL 10 MG PO TABS: 10.0000 mg | ORAL_TABLET | Freq: Three times a day (TID) | ORAL | Status: DC | PRN

## 2012-10-02 MED ORDER — TRAMADOL HCL 50 MG PO TABS: 50.0000 mg | ORAL_TABLET | Freq: Three times a day (TID) | ORAL | Status: DC | PRN

## 2012-10-02 NOTE — Progress Notes (Signed)
  Subjective:    Marilyn Fowler is a 37 y.o. female who presents for follow up of low back problems. Current symptoms include: pain in R low back (burning, sharp and throbbing in character; 8/10 in severity) and weakness in R leg. Symptoms have worsened from the previous visit. Exacerbating factors identified by the patient are bending forwards and sitting.  The following portions of the patient's history were reviewed and updated as appropriate: allergies, current medications, past family history, past medical history, past social history, past surgical history and problem list.    Objective:    BP 124/82  Pulse 80  Temp(Src) 98.2 F (36.8 C) (Oral)  Wt 231 lb 3.2 oz (104.872 kg)  BMI 44.8 kg/m2  SpO2 97%  LMP 09/28/2012 General appearance: alert, cooperative, appears stated age and no distress Extremities: extremities normal, atraumatic, no cyanosis or edema Neurologic: Motor: weakness R low ext with hip flexion and knee ext Reflexes: 2+ and symmetric Gait: Antalgic    Assessment:    Nonspecific acute low back pain    Plan:    Natural history and expected course discussed. Questions answered. Stretching exercises discussed. Short (2-4 day) period of relative rest recommended until acute symptoms improve. Ice to affected area as needed for local pain relief. Heat to affected area as needed for local pain relief. Muscle relaxants per medication orders. Follow-up in 2 weeks. --or sooner prn

## 2012-10-02 NOTE — Telephone Encounter (Signed)
Noted pt has an appt

## 2012-10-02 NOTE — Patient Instructions (Signed)
Back Pain, Adult  Low back pain is very common. About 1 in 5 people have back pain. The cause of low back pain is rarely dangerous. The pain often gets better over time. About half of people with a sudden onset of back pain feel better in just 2 weeks. About 8 in 10 people feel better by 6 weeks.   CAUSES  Some common causes of back pain include:  · Strain of the muscles or ligaments supporting the spine.  · Wear and tear (degeneration) of the spinal discs.  · Arthritis.  · Direct injury to the back.  DIAGNOSIS  Most of the time, the direct cause of low back pain is not known. However, back pain can be treated effectively even when the exact cause of the pain is unknown. Answering your caregiver's questions about your overall health and symptoms is one of the most accurate ways to make sure the cause of your pain is not dangerous. If your caregiver needs more information, he or she may order lab work or imaging tests (X-rays or MRIs). However, even if imaging tests show changes in your back, this usually does not require surgery.  HOME CARE INSTRUCTIONS  For many people, back pain returns. Since low back pain is rarely dangerous, it is often a condition that people can learn to manage on their own.   · Remain active. It is stressful on the back to sit or stand in one place. Do not sit, drive, or stand in one place for more than 30 minutes at a time. Take short walks on level surfaces as soon as pain allows. Try to increase the length of time you walk each day.  · Do not stay in bed. Resting more than 1 or 2 days can delay your recovery.  · Do not avoid exercise or work. Your body is made to move. It is not dangerous to be active, even though your back may hurt. Your back will likely heal faster if you return to being active before your pain is gone.  · Pay attention to your body when you  bend and lift. Many people have less discomfort when lifting if they bend their knees, keep the load close to their bodies, and  avoid twisting. Often, the most comfortable positions are those that put less stress on your recovering back.  · Find a comfortable position to sleep. Use a firm mattress and lie on your side with your knees slightly bent. If you lie on your back, put a pillow under your knees.  · Only take over-the-counter or prescription medicines as directed by your caregiver. Over-the-counter medicines to reduce pain and inflammation are often the most helpful. Your caregiver may prescribe muscle relaxant drugs. These medicines help dull your pain so you can more quickly return to your normal activities and healthy exercise.  · Put ice on the injured area.  · Put ice in a plastic bag.  · Place a towel between your skin and the bag.  · Leave the ice on for 15-20 minutes, 3-4 times a day for the first 2 to 3 days. After that, ice and heat may be alternated to reduce pain and spasms.  · Ask your caregiver about trying back exercises and gentle massage. This may be of some benefit.  · Avoid feeling anxious or stressed. Stress increases muscle tension and can worsen back pain. It is important to recognize when you are anxious or stressed and learn ways to manage it. Exercise is a great option.  SEEK MEDICAL CARE IF:  · You have pain that is not relieved with rest or   medicine.  · You have pain that does not improve in 1 week.  · You have new symptoms.  · You are generally not feeling well.  SEEK IMMEDIATE MEDICAL CARE IF:   · You have pain that radiates from your back into your legs.  · You develop new bowel or bladder control problems.  · You have unusual weakness or numbness in your arms or legs.  · You develop nausea or vomiting.  · You develop abdominal pain.  · You feel faint.  Document Released: 04/15/2005 Document Revised: 10/15/2011 Document Reviewed: 09/03/2010  Exit[employer]® Patient Information ©2014 [employer].

## 2012-10-02 NOTE — Telephone Encounter (Signed)
Patient Information:  Caller Name: [name]  Phone: [phone]  Patient: Marilyn Fowler, Marilyn Fowler  Gender: Female  DOB: 1975/07/07  Age: 37 Years  PCP: Sheliah Hatch  Pregnant: No  Office Follow Up:  Does the office need to follow up with this patient?: No  Instructions For The Office: N/A  RN Note:  Moderate, spasmotic right flank pain for past week. Pain lessens when ambulatory; worse when sitting or bending. Knee buckled when walking 10/01/12. Current pain rated 4-5 after muscle relaxer. Has brief shooting, sharp spasms of pain rated 8-9/10 lasting from 1-5 minutes; this morning pain lasted >  1 hour. Denies dysuria or hematuria. Was lying on heating pad all day on day of injury.  Symptoms  Reason For Call & Symptoms: Ongoing right lower back pain.  Seen in UC 6/2/1 due to "threw back out" at pool when she twisted to catch 40 lb son.  Seen by Dr Beverely Low 09/29/12. Taking Ibuprofen and muscle relaxer with continued back spasms and knee locked up.  Continues to be painful to sit or bend over.  Reviewed Health History In EMR: Yes  Reviewed Medications In EMR: Yes  Reviewed Allergies In EMR: Yes  Reviewed Surgeries / Procedures: Yes  Date of Onset of Symptoms: 09/25/2012  Treatments Tried: Ibuprofen every 6 hours, Muscle relaxer  Treatments Tried Worked: No OB / GYN:  LMP: 10/01/2012  Guideline(s) Used:  Flank Pain  Disposition Per Guideline:   See Today in Office  Reason For Disposition Reached:   Moderate pain (e.g., interferes with normal activities or awakens from sleep)  Advice Given:  Cold or Heat:  Heat Pack: If pain lasts over 2 days, apply heat to the sore area. Use a heat pack, heating pad, or warm wet washcloth. Do this for 10 minutes, then as needed.  Activity:  Continue normal activities as much as your pain permits.  Avoid any activities that significantly increase the pain. Avoid heavy lifting and strenuous exercise until completely well. Staying in bed is not  needed.  Pain Medicines:  For pain relief, you can take either acetaminophen, ibuprofen, or naproxen.  Ibuprofen (e.g., Motrin, Advil):  Take 400 mg (two 200 mg pills) by mouth every 6 hours.  Another choice is to take 600 mg (three 200 mg pills) by mouth every 8 hours.  The most you should take each day is 1,200 mg (six 200 mg pills), unless your doctor has told you to take more.  Call Back If:  Fever over 100.5 F (38.1 C)  Burning with urination or blood in urine  You become worse.  Patient Will Follow Care Advice:  YES  Appointment Scheduled:  10/02/2012 13:30:00 Appointment Scheduled Provider:  Lelon Perla.

## 2012-10-04 NOTE — Assessment & Plan Note (Signed)
New to provider.  Will controlled today on current meds.  meds are not convenient in their dosing.  Will start phentermine and monitor for BP elevation.  If pt able to lose weight, suspect she'll be able to come off meds or at least transition to more convenient meds.  Will follow closely.

## 2012-10-04 NOTE — Assessment & Plan Note (Signed)
New to provider.  Pt previously able to lose 100 lbs while on stimulant.  Restart Phentermine and monitor BP closely.  Encouraged healthy diet and regular exercise.  Will follow.

## 2012-10-04 NOTE — Assessment & Plan Note (Signed)
New to provider, ongoing for pt.  Has upcoming appt w/ psych.  Will follow.

## 2012-10-27 ENCOUNTER — Ambulatory Visit: Payer: Self-pay | Admitting: Family Medicine

## 2012-11-18 ENCOUNTER — Ambulatory Visit (INDEPENDENT_AMBULATORY_CARE_PROVIDER_SITE_OTHER): Payer: Managed Care, Other (non HMO) | Admitting: Family Medicine

## 2012-11-18 ENCOUNTER — Telehealth: Payer: Self-pay | Admitting: Family Medicine

## 2012-11-18 ENCOUNTER — Encounter: Payer: Self-pay | Admitting: Family Medicine

## 2012-11-18 VITALS — BP 130/90 | HR 82 | Temp 98.7°F | Wt 227.2 lb

## 2012-11-18 DIAGNOSIS — R079 Chest pain, unspecified: Secondary | ICD-10-CM

## 2012-11-18 LAB — D-DIMER, QUANTITATIVE: D-Dimer, Quant: 0.27 ug/mL-FEU (ref 0.00–0.48)

## 2012-11-18 LAB — CK TOTAL AND CKMB (NOT AT ARMC)
CK, MB: 0.5 ng/mL (ref 0.3–4.0)
Total CK: 31 U/L (ref 7–177)

## 2012-11-18 MED ORDER — GI COCKTAIL ~~LOC~~
30.0000 mL | Freq: Once | ORAL | Status: AC
  Administered 2012-11-18: 30 mL via ORAL

## 2012-11-18 MED ORDER — KETOROLAC TROMETHAMINE 60 MG/2ML IM SOLN
60.0000 mg | Freq: Once | INTRAMUSCULAR | Status: AC
  Administered 2012-11-18: 60 mg via INTRAMUSCULAR

## 2012-11-18 MED ORDER — KETOROLAC TROMETHAMINE 10 MG PO TABS: 10.0000 mg | ORAL_TABLET | Freq: Four times a day (QID) | ORAL | Status: DC | PRN

## 2012-11-18 NOTE — Telephone Encounter (Signed)
Patient Information:  Caller Name: [name]  Phone: [phone]  Patient: Marilyn Fowler, Marilyn Fowler  Gender: Female  DOB: 1975-09-27  Age: 37 Years  PCP: Sheliah Hatch  Pregnant: No  Office Follow Up:  Does the office need to follow up with this patient?: No  Instructions For The Office: N/A  RN Note:  chest hurts on right side.  Pain increases with deep breath. Pt states she is breathing shallow which is making her feel SOB and dizzy.  Pt states pain increases with laying down.  Symptoms  Reason For Call & Symptoms: chest pains and SOB x 6 days.  Pt states it is not heart related.  Pt states she does not have the copay to come in and see her but it is getting worse.  Pt states it feels like it did when she had Pleurisy.  Reviewed Health History In EMR: Yes  Reviewed Medications In EMR: Yes  Reviewed Allergies In EMR: Yes  Reviewed Surgeries / Procedures: Yes  Date of Onset of Symptoms: 11/12/2012 OB / GYN:  LMP: 10/21/2012  Guideline(s) Used:  Chest Pain  Disposition Per Guideline:   See Today in Office  Reason For Disposition Reached:   All other patients with chest pain  Advice Given:  N/A  Patient Will Follow Care Advice:  YES  Appointment Scheduled:  11/18/2012 14:45:00 Appointment Scheduled Provider:  Sheliah Hatch.  (RN offered pt an earlier appt with another provider, but pt declined.  Pt only wanted to see Dr Beverely Low)

## 2012-11-18 NOTE — Progress Notes (Signed)
  Subjective:    Patient ID: Marilyn Fowler, female    DOB: 1976-01-19, 37 y.o.   MRN: [id number]  HPI Chest pain- sxs started 6 days ago but 'i don't think it's my heart, i think it's my lungs'.  Pain is substernal, worse w/ deep breaths or lying down.  Pain is constant.  Also having pain in upper stomach.  + SOB, dizziness.  No nausea, no fever.  Hx of similar 1 yr ago w/ pleurisy.  + fatigue x10 days and then chest began to hurt.  No recent travel, immobility, new or different meds.  + intermittent dry cough.   Review of Systems For ROS see HPI     Objective:   Physical Exam  Vitals reviewed. Constitutional: She is oriented to person, place, and time. She appears well-developed and well-nourished. She appears distressed (clearly uncomfortable).  HENT:  Head: Normocephalic and atraumatic.  Eyes: Conjunctivae and EOM are normal. Pupils are equal, round, and reactive to light.  Neck: Normal range of motion. Neck supple. No thyromegaly present.  Cardiovascular: Normal rate, regular rhythm, normal heart sounds and intact distal pulses.   No murmur heard. Pulmonary/Chest: Effort normal and breath sounds normal. No respiratory distress. She exhibits tenderness (TTP over sternocostal jxn bilaterally).  Abdominal: Soft. She exhibits no distension. There is no tenderness.  Musculoskeletal: She exhibits no edema.  Lymphadenopathy:    She has no cervical adenopathy.  Neurological: She is alert and oriented to person, place, and time.  Skin: Skin is warm and dry.  Psychiatric: She has a normal mood and affect. Her behavior is normal.          Assessment & Plan:

## 2012-11-18 NOTE — Telephone Encounter (Signed)
Apt pending for today.       KP

## 2012-11-18 NOTE — Assessment & Plan Note (Signed)
New.  Pt's EKG WNL.  + TTP- suspect this is musculoskeletal.  Check Ddimer and CEs to r/o PE or MI.  Start toradol w/ injxn in office and continue oral meds tomorrow.  Reviewed supportive care and red flags that should prompt return.  Pt expressed understanding and is in agreement w/ plan.

## 2012-11-18 NOTE — Patient Instructions (Addendum)
Start the Toradol pills tomorrow- take w/ food We'll notify you of your lab results and figure out next steps Alternate heat/ice for pain relief Call with any questions or concerns Hang in there!!!

## 2012-11-19 ENCOUNTER — Telehealth: Payer: Self-pay | Admitting: Family Medicine

## 2012-11-19 ENCOUNTER — Ambulatory Visit: Payer: Self-pay | Admitting: Family Medicine

## 2012-11-19 LAB — CBC WITH DIFFERENTIAL/PLATELET
Basophils Absolute: 0.1 10*3/uL (ref 0.0–0.1)
Eosinophils Absolute: 0.1 10*3/uL (ref 0.0–0.7)
Hemoglobin: 13.1 g/dL (ref 12.0–15.0)
Lymphocytes Relative: 29.4 % (ref 12.0–46.0)
MCHC: 33.8 g/dL (ref 30.0–36.0)
Monocytes Relative: 7.6 % (ref 3.0–12.0)
Neutrophils Relative %: 61.4 % (ref 43.0–77.0)
RDW: 14.8 % — ABNORMAL HIGH (ref 11.5–14.6)

## 2012-11-19 MED ORDER — NAPROXEN 500 MG PO TABS: 500.0000 mg | ORAL_TABLET | Freq: Two times a day (BID) | ORAL | Status: DC

## 2012-11-19 NOTE — Telephone Encounter (Signed)
PA for Toradol was approved from 11/19/12-11/23/12 ref # [id number]. Pharmacy and the patient has been made aware. Patient needs a note for work for tomorrow---    KP

## 2012-11-19 NOTE — Telephone Encounter (Signed)
If toradol requires prior auth can start Naproxen 500mg  BID x7 days and then as needed. Take w/ food.  #60, no refills

## 2012-11-19 NOTE — Telephone Encounter (Addendum)
Rx sent  And mother has been made aware. PA is pending.

## 2012-11-19 NOTE — Telephone Encounter (Signed)
Caller: Morene Rankins; Phone: [phone]; Reason for Call: Mom states was seen in the office 11/18/12 and Rx given for toradol po.  Mom states Rx needs prior authorization so she does not have any medication at this time.  Mom would like to know what pt can use for pain control?  PLEASE F/U WITH MOM, THANK YOU.

## 2012-11-20 NOTE — Telephone Encounter (Signed)
Faxed.   KP 

## 2012-11-20 NOTE — Telephone Encounter (Signed)
Patient called in stating that she would like work note faxed to [phone] attn: Chanetta Marshall

## 2012-11-20 NOTE — Telephone Encounter (Signed)
Pt CANNOT take Naproxen and Toradol together.  If the Toradol was approved and that's what she wants to take for the next 4 days, she can't take any additional Naproxen, advil, aleve, ibuprofen, etc.  Ok for work note until Monday

## 2012-11-26 ENCOUNTER — Telehealth: Payer: Self-pay

## 2012-11-26 ENCOUNTER — Telehealth: Payer: Self-pay | Admitting: Family Medicine

## 2012-11-26 ENCOUNTER — Ambulatory Visit: Payer: Managed Care, Other (non HMO) | Admitting: Family Medicine

## 2012-11-26 ENCOUNTER — Ambulatory Visit (INDEPENDENT_AMBULATORY_CARE_PROVIDER_SITE_OTHER): Payer: Managed Care, Other (non HMO) | Admitting: Internal Medicine

## 2012-11-26 ENCOUNTER — Encounter: Payer: Self-pay | Admitting: Internal Medicine

## 2012-11-26 VITALS — BP 118/80 | HR 73 | Temp 98.4°F | Wt 222.2 lb

## 2012-11-26 DIAGNOSIS — R079 Chest pain, unspecified: Secondary | ICD-10-CM

## 2012-11-26 DIAGNOSIS — M940 Chondrocostal junction syndrome [Tietze]: Secondary | ICD-10-CM

## 2012-11-26 NOTE — Telephone Encounter (Signed)
Pt is been seen today in the office by Dr. Alwyn Ren @ 4:00pm.//AB/CMA

## 2012-11-26 NOTE — Patient Instructions (Addendum)
Use an anti-inflammatory cream such as Aspercreme or Zostrix cream twice a day to the area as needed. In lieu of this warm moist compresses or  hot water bottle can be used. Do not apply ice .Take Aleve one to 2 every 8-12 hours with food as needed. Do not take this with aspirin or ibuprofen . Order for x-rays entered into  the computer; these will be performed at [address] [facility]. across from [facility] as soon as your menses begin. No appointment is necessary.

## 2012-11-26 NOTE — Telephone Encounter (Signed)
Patient Information:  Caller Name: Stacee  Phone: [phone]  Patient: Marilyn Fowler, Marilyn Fowler  Gender: Female  DOB: 1976/01/09  Age: 37 Years  PCP: Sheliah Hatch  Pregnant: No  Office Follow Up:  Does the office need to follow up with this patient?: No  Instructions For The Office: N/A   Symptoms  Reason For Call & Symptoms: Patient calling about shortness of breath and chest pain.  Sharp pains in chest and feels that she cannot take a deep enough breath.  Was evaluated in office 11/18/12 for same and states, "It is the same.  It just won't go away."  Pain rated at 8 of 10, intermittent.  has been evaluated for same. Able to talk in complete sentences.  States she took all Toradol that was prescribed and relates previously she had to take it for 2-3 weeks to get relief.  Emergent symptoms ruled out.  Home care for the interim and parameters for callback given.  [id number] in Office per Chest Pain protocol due to "All other patients with chest pain."  Caller agreed.  Reviewed Health History In EMR: Yes  Reviewed Medications In EMR: Yes  Reviewed Allergies In EMR: Yes  Reviewed Surgeries / Procedures: Yes  Date of Onset of Symptoms: 11/12/2012  Treatments Tried: Heat alternating with ice, Rx Toradol  Treatments Tried Worked: No OB / GYN:  LMP: 10/31/2012  Guideline(s) Used:  Breathing Difficulty  Chest Pain  Disposition Per Guideline:   See Today in Office  Reason For Disposition Reached:   All other patients with chest pain  Advice Given:  Call Back If:  Severe chest pain  Difficulty breathing  Fever  You become worse.  Patient Will Follow Care Advice:  YES  Appointment Scheduled:  11/26/2012 14:45:00 Appointment Scheduled Provider:  Sheliah Hatch.

## 2012-11-26 NOTE — Progress Notes (Signed)
Subjective:    Patient ID: Marilyn Fowler, female    DOB: 1975-12-20, 37 y.o.   MRN: [id number]  HPI  She describes constant substernal pressure and orthopnea over the last 2 weeks & pleuritic pain in the right inframammary area with inspiration. She also has a nonproductive cough. There has been no wheezing.  She has a 40 pound child who has been jumping in her arms and swimming pool.  She was seen 11/18/12. At that time troponin, CK-MB, and d-dimer were normal.  She's had some postural orthopnea and dizziness. She also has had some nocturnal dyspnea.  Tramadol and ibuprofen have not been of significant benefit.  There is no personal history of clotting disorder. She is a former  social smoker. She is not on hormonal replacement therapy.  Family history is positive for deep venous thrombosis in both maternal grandparents.    Review of Systems  She denies fever, chills, or sweats.  She's had no frontal sinus pain, facial pain, nasal purulence, or purulent sputum. She also denies hemoptysis. She's had no pain or swelling in her calves.  She has a history of headaches. She denies a constellation of headaches, flushing, chest pain, and diarrhea.     Objective:   Physical Exam Gen.:  well-nourished in appearance. Alert, appropriate and cooperative throughout exam. Appears younger than stated age   Eyes: No corneal or conjunctival inflammation noted. No icterus  Ears: External  ear exam reveals no significant lesions or deformities. Canals clear .TMs normal.  Nose: External nasal exam reveals no deformity or inflammation. Nasal mucosa are pink and moist. No lesions or exudates noted.   Mouth: Oral mucosa and oropharynx reveal no lesions or exudates. Teeth in good repair. Neck: No deformities, masses, or tenderness noted.  Lungs: Normal respiratory effort; chest expands symmetrically. Lungs are clear to auscultation without rales, wheezes, or increased work of breathing. No rub  heard in the right inframammary area.  Chest: Classic costochondritis findings to the right of the sternum with palpation and posterior rotation of her arms. Heart: Normal rate and rhythm. Normal S1 and S2. No gallop, click, or rub. No murmur. Abdomen: Bowel sounds normal; abdomen soft and nontender. No masses, organomegaly or hernias noted.                                  Musculoskeletal/extremities: No deformity or scoliosis noted of  the thoracic or lumbar spine.   No clubbing, cyanosis, edema, or significant extremity  deformity noted. Homans sign negative bilaterally .Tone & strength  Normal. Joints normal . Nail health good. Able to lie down & sit up w/o help. Negative SLR bilaterally Vascular: Carotid, radial artery, dorsalis pedis and  posterior tibial pulses are full and equal. No bruits present. Neurologic: Alert and oriented x3.        Skin: Intact without suspicious lesions or rashes. Specifically no herpes zoster rash in the right inframammary area Lymph: No cervical, axillary lymphadenopathy present. Psych: Mood and affect are normal. Normally interactive  Assessment & Plan:  #1 classic costochondritis probably from hyperextension injury catching her child jumping into her arms  Plan: Aleve one-2 every 8 hours with food as needed. Zostrix cream or warm compresses 2 to 3 times a day as needed. Chest x-ray once menses start.

## 2012-12-25 ENCOUNTER — Ambulatory Visit: Payer: Self-pay | Admitting: Family Medicine

## 2013-02-04 ENCOUNTER — Ambulatory Visit: Payer: Self-pay | Admitting: Family Medicine

## 2013-02-18 ENCOUNTER — Encounter: Payer: Self-pay | Admitting: Family Medicine

## 2013-02-18 ENCOUNTER — Ambulatory Visit (INDEPENDENT_AMBULATORY_CARE_PROVIDER_SITE_OTHER): Payer: Managed Care, Other (non HMO) | Admitting: Family Medicine

## 2013-02-18 VITALS — BP 108/80 | HR 87 | Temp 98.8°F | Wt 231.0 lb

## 2013-02-18 DIAGNOSIS — R112 Nausea with vomiting, unspecified: Secondary | ICD-10-CM

## 2013-02-18 LAB — POCT URINALYSIS DIPSTICK
Blood, UA: NEGATIVE
Glucose, UA: NEGATIVE
Ketones, UA: NEGATIVE
Leukocytes, UA: NEGATIVE
Nitrite, UA: NEGATIVE

## 2013-02-18 LAB — POCT URINE PREGNANCY: Preg Test, Ur: NEGATIVE

## 2013-02-18 MED ORDER — PROMETHAZINE HCL 25 MG PO TABS: 25.0000 mg | ORAL_TABLET | Freq: Three times a day (TID) | ORAL | Status: DC | PRN

## 2013-02-18 MED ORDER — PROMETHAZINE HCL 25 MG/ML IJ SOLN
25.0000 mg | Freq: Once | INTRAMUSCULAR | Status: AC
  Administered 2013-02-18: 25 mg via INTRAVENOUS

## 2013-02-18 NOTE — Patient Instructions (Addendum)
Follow Up as needed.  If vomiting continues even after injection, go to the ER.       Nausea and Vomiting Nausea is a sick feeling that often comes before throwing up (vomiting). Vomiting is a reflex where stomach contents come out of your mouth. Vomiting can cause severe loss of body fluids (dehydration). Children and elderly adults can become dehydrated quickly, especially if they also have diarrhea. Nausea and vomiting are symptoms of a condition or disease. It is important to find the cause of your symptoms. CAUSES   Direct irritation of the stomach lining. This irritation can result from increased acid production (gastroesophageal reflux disease), infection, food poisoning, taking certain medicines (such as nonsteroidal anti-inflammatory drugs), alcohol use, or tobacco use.  Signals from the brain.These signals could be caused by a headache, heat exposure, an inner ear disturbance, increased pressure in the brain from injury, infection, a tumor, or a concussion, pain, emotional stimulus, or metabolic problems.  An obstruction in the gastrointestinal tract (bowel obstruction).  Illnesses such as diabetes, hepatitis, gallbladder problems, appendicitis, kidney problems, cancer, sepsis, atypical symptoms of a heart attack, or eating disorders.  Medical treatments such as chemotherapy and radiation.  Receiving medicine that makes you sleep (general anesthetic) during surgery. DIAGNOSIS Your caregiver may ask for tests to be done if the problems do not improve after a few days. Tests may also be done if symptoms are severe or if the reason for the nausea and vomiting is not clear. Tests may include:  Urine tests.  Blood tests.  Stool tests.  Cultures (to look for evidence of infection).  X-rays or other imaging studies. Test results can help your caregiver make decisions about treatment or the need for additional tests. TREATMENT You need to stay well hydrated. Drink frequently but  in small amounts.You may wish to drink water, sports drinks, clear broth, or eat frozen ice pops or gelatin dessert to help stay hydrated.When you eat, eating slowly may help prevent nausea.There are also some antinausea medicines that may help prevent nausea. HOME CARE INSTRUCTIONS   Take all medicine as directed by your caregiver.  If you do not have an appetite, do not force yourself to eat. However, you must continue to drink fluids.  If you have an appetite, eat a normal diet unless your caregiver tells you differently.  Eat a variety of complex carbohydrates (rice, wheat, potatoes, bread), lean meats, yogurt, fruits, and vegetables.  Avoid high-fat foods because they are more difficult to digest.  Drink enough water and fluids to keep your urine clear or pale yellow.  If you are dehydrated, ask your caregiver for specific rehydration instructions. Signs of dehydration may include:  Severe thirst.  Dry lips and mouth.  Dizziness.  Dark urine.  Decreasing urine frequency and amount.  Confusion.  Rapid breathing or pulse. SEEK IMMEDIATE MEDICAL CARE IF:   You have blood or brown flecks (like coffee grounds) in your vomit.  You have black or bloody stools.  You have a severe headache or stiff neck.  You are confused.  You have severe abdominal pain.  You have chest pain or trouble breathing.  You do not urinate at least once every 8 hours.  You develop cold or clammy skin.  You continue to vomit for longer than 24 to 48 hours.  You have a fever. MAKE SURE YOU:   Understand these instructions.  Will watch your condition.  Will get help right away if you are not doing well or get  worse. Document Released: 04/15/2005 Document Revised: 07/08/2011 Document Reviewed: 09/12/2010 [facility] Patient Information 2014 [place], [place].

## 2013-02-18 NOTE — Progress Notes (Signed)
  Subjective:     Marilyn Fowler is a 37 y.o. female who presents for evaluation of nausea and vomiting. Onset of symptoms was 3 days ago. Patient describes nausea as moderate. Vomiting has occurred several times over the past 3 days. Vomitus is described as bilious. Symptoms have been associated with no other symptoms. Patient denies alcohol overuse, hematemesis, melena and possibility of pregnancy. Symptoms have gradually worsened. Evaluation to date has been none. Treatment to date has been none.   The following portions of the patient's history were reviewed and updated as appropriate: allergies, current medications, past family history, past medical history, past social history, past surgical history and problem list.  Review of Systems Pertinent items are noted in HPI.   Objective:    BP 108/80  Pulse 87  Temp(Src) 98.8 F (37.1 C) (Oral)  Wt 231 lb (104.781 kg)  BMI 44.76 kg/m2  SpO2 97%  LMP 02/04/2013 General appearance: alert, cooperative, appears stated age and no distress Throat: mucous membranes dry Neck: no adenopathy, supple, symmetrical, trachea midline and thyroid not enlarged, symmetric, no tenderness/mass/nodules Lungs: clear to auscultation bilaterally Heart: S1, S2 normal Abdomen: soft, non-tender; bowel sounds normal; no masses,  no organomegaly   Assessment:    Nausea and vomiting   Plan:    Antiemetics per medication orders. Dietary guidelines discussed. Agricultural engineer distributed. Labs per orders. Follow up in a few days if not improving. ---or go to ER IM phenergan given in office Pt instructed to go straight home and someone else will pick up her rx---she lives less than 3 miles away

## 2013-02-19 LAB — CBC WITH DIFFERENTIAL/PLATELET
Basophils Absolute: 0 10*3/uL (ref 0.0–0.1)
HCT: 40.1 % (ref 36.0–46.0)
Lymphs Abs: 2.4 10*3/uL (ref 0.7–4.0)
Monocytes Relative: 5.2 % (ref 3.0–12.0)
Neutrophils Relative %: 70 % (ref 43.0–77.0)
Platelets: 315 10*3/uL (ref 150.0–400.0)
RDW: 13.6 % (ref 11.5–14.6)
WBC: 10 10*3/uL (ref 4.5–10.5)

## 2013-02-19 LAB — BASIC METABOLIC PANEL
BUN: 13 mg/dL (ref 6–23)
Calcium: 9.3 mg/dL (ref 8.4–10.5)
Creatinine, Ser: 0.8 mg/dL (ref 0.4–1.2)
GFR: 89.91 mL/min (ref >60.00)
Glucose, Bld: 84 mg/dL (ref 70–99)

## 2013-02-19 LAB — HEPATIC FUNCTION PANEL
AST: 22 U/L (ref 0–37)
Bilirubin, Direct: 0.1 mg/dL (ref 0.0–0.3)
Total Bilirubin: 0.4 mg/dL (ref 0.3–1.2)

## 2013-03-04 ENCOUNTER — Other Ambulatory Visit: Payer: Self-pay

## 2013-03-09 ENCOUNTER — Other Ambulatory Visit: Payer: Self-pay | Admitting: Family Medicine

## 2013-03-09 NOTE — Telephone Encounter (Signed)
Med filled.  

## 2013-03-24 ENCOUNTER — Ambulatory Visit (INDEPENDENT_AMBULATORY_CARE_PROVIDER_SITE_OTHER): Payer: Managed Care, Other (non HMO) | Admitting: Internal Medicine

## 2013-03-24 ENCOUNTER — Encounter: Payer: Self-pay | Admitting: Internal Medicine

## 2013-03-24 VITALS — BP 109/77 | HR 84 | Temp 98.7°F | Wt 240.0 lb

## 2013-03-24 DIAGNOSIS — B349 Viral infection, unspecified: Secondary | ICD-10-CM

## 2013-03-24 DIAGNOSIS — B9789 Other viral agents as the cause of diseases classified elsewhere: Secondary | ICD-10-CM

## 2013-03-24 MED ORDER — HYDROCODONE-HOMATROPINE 5-1.5 MG/5ML PO SYRP: 5.0000 mL | ORAL_SOLUTION | Freq: Two times a day (BID) | ORAL | Status: DC | PRN

## 2013-03-24 MED ORDER — AMOXICILLIN 500 MG PO CAPS: 1000.0000 mg | ORAL_CAPSULE | Freq: Two times a day (BID) | ORAL | Status: DC

## 2013-03-24 NOTE — Patient Instructions (Signed)
Rest, fluids , tylenol For cough, take Mucinex DM twice a day as needed  If the cough is persistent use hydrocodone twice a day (willl make you sleepy) For congestion use OTC Nasocort: 2 nasal sprays on each side of the nose daily  until you feel better Take the antibiotic as prescribed  (Amoxicillin) if not better in 3-4 days  Call if no better in few days Call anytime if the symptoms are severe

## 2013-03-24 NOTE — Progress Notes (Signed)
Pre visit review using our clinic review tool, if applicable. No additional management support is needed unless otherwise documented below in the visit note. 

## 2013-03-24 NOTE — Progress Notes (Signed)
   Subjective:    Patient ID: Marilyn Fowler, female    DOB: 1976-03-31, 37 y.o.   MRN: [id number]  HPI Acute visit Symptoms started a week ago: Persisting cough, occasional yellow sputum production with no hemoptysis, some sore throat, taking a variety of OTCs without much help.  Past Medical History  Diagnosis Date  . Hypertension     gestational  . Hemorrhoid   . Migraine   . Anemia   . Depression   . Anxiety   . Blood transfusion without reported diagnosis    Past Surgical History  Procedure Laterality Date  . Ventral hernia repair    . Hemorrhoid surgery      Oct 2011  . Tonsillectomy    . Hernia repair    . Hemorrhoid surgery     History   Social History  . Marital Status: Married    Spouse Name: N/A    Number of Children: 2  . Years of Education: N/A   Occupational History  .     Social History Main Topics  . Smoking status: Former Smoker -- 10 years    Types: Cigarettes  . Smokeless tobacco: Never Used     Comment: smoked 1994-2012, up to 1 pp WEEK  . Alcohol Use: No     Comment: socially  . Drug Use: No  . Sexual Activity: Yes   Other Topics Concern  . Not on file   Social History Narrative   Separated, not on BCP         Review of Systems + Low-grade fever, no chills, no ear pain or discharge Some chest pain due to to cough, no shortness of breath or wheezing. + Generalized aches, some headaches, fatigue. No abdominal pain or nausea .    Objective:   Physical Exam BP 109/77  Pulse 84  Temp(Src) 98.7 F (37.1 C)  Wt 240 lb (108.863 kg)  SpO2 98% General -- alert, well-developed, NAD.   HEENT-- Not pale. TMs normal, throat symmetric, no redness or discharge. Face symmetric, sinuses slt tender symmetrically to palpation. Nose congested.  Lungs -- normal respiratory effort, no intercostal retractions, no accessory muscle use, and normal breath sounds.  Heart-- normal rate, regular rhythm, no murmur.   Neurologic--  alert & oriented  X3. Speech normal, gait normal, strength normal in all extremities.  Psych-- Cognition and judgment appear intact. Cooperative with normal attention span and concentration. No anxious appearing , no depressed appearing.      Assessment & Plan:  Likely a viral syndrome, See instructions, if not better she will start antibiotics (early sinusitis?)

## 2013-04-07 ENCOUNTER — Ambulatory Visit: Payer: Self-pay | Admitting: Family Medicine

## 2013-06-08 ENCOUNTER — Telehealth: Payer: Self-pay

## 2013-06-08 NOTE — Telephone Encounter (Signed)
Medication and allergies:  Reviewed and updated  90 day supply/mail order: n/a Local pharmacy:  [id number] - [place], [place] - [address]   Immunizations due:  Influenza and Tdap    A/P: No changes to personal, family history or past surgical hx PAP- patient stated it has been a while Flu-DUE Tdap-DUE   To Discuss with Provider: Needs refills on the following medications: Amlodipine 10 mg PO Daily Hydralazine 25 mg PO Q8H  Propranolol 10 mg PO TID

## 2013-06-09 ENCOUNTER — Ambulatory Visit: Payer: Managed Care, Other (non HMO) | Admitting: Family Medicine

## 2013-07-26 ENCOUNTER — Encounter (HOSPITAL_COMMUNITY): Payer: Self-pay | Admitting: Emergency Medicine

## 2013-07-26 ENCOUNTER — Emergency Department (HOSPITAL_COMMUNITY)
Admission: EM | Admit: 2013-07-26 | Discharge: 2013-07-26 | Disposition: A | Discharge status: Other Acute Inpatient Hospital | Payer: Managed Care, Other (non HMO) | Attending: Emergency Medicine | Admitting: Emergency Medicine

## 2013-07-26 ENCOUNTER — Encounter (HOSPITAL_COMMUNITY): Payer: Self-pay | Admitting: *Deleted

## 2013-07-26 ENCOUNTER — Inpatient Hospital Stay (HOSPITAL_COMMUNITY)
Admission: AD | Admit: 2013-07-26 | Discharge: 2013-07-30 | DRG: 885 | Disposition: A | Discharge status: Home / Self Care | Payer: Managed Care, Other (non HMO) | Source: Intra-hospital | Attending: Psychiatry | Admitting: Psychiatry

## 2013-07-26 DIAGNOSIS — Z825 Family history of asthma and other chronic lower respiratory diseases: Secondary | ICD-10-CM

## 2013-07-26 DIAGNOSIS — F3289 Other specified depressive episodes: Secondary | ICD-10-CM | POA: Insufficient documentation

## 2013-07-26 DIAGNOSIS — R45851 Suicidal ideations: Secondary | ICD-10-CM | POA: Insufficient documentation

## 2013-07-26 DIAGNOSIS — G43909 Migraine, unspecified, not intractable, without status migrainosus: Secondary | ICD-10-CM | POA: Insufficient documentation

## 2013-07-26 DIAGNOSIS — Z8679 Personal history of other diseases of the circulatory system: Secondary | ICD-10-CM | POA: Insufficient documentation

## 2013-07-26 DIAGNOSIS — F411 Generalized anxiety disorder: Secondary | ICD-10-CM | POA: Insufficient documentation

## 2013-07-26 DIAGNOSIS — Z79899 Other long term (current) drug therapy: Secondary | ICD-10-CM | POA: Insufficient documentation

## 2013-07-26 DIAGNOSIS — Z8249 Family history of ischemic heart disease and other diseases of the circulatory system: Secondary | ICD-10-CM

## 2013-07-26 DIAGNOSIS — F39 Unspecified mood [affective] disorder: Secondary | ICD-10-CM | POA: Diagnosis present

## 2013-07-26 DIAGNOSIS — F1310 Sedative, hypnotic or anxiolytic abuse, uncomplicated: Secondary | ICD-10-CM | POA: Insufficient documentation

## 2013-07-26 DIAGNOSIS — I10 Essential (primary) hypertension: Secondary | ICD-10-CM | POA: Diagnosis present

## 2013-07-26 DIAGNOSIS — Z862 Personal history of diseases of the blood and blood-forming organs and certain disorders involving the immune mechanism: Secondary | ICD-10-CM | POA: Insufficient documentation

## 2013-07-26 DIAGNOSIS — Z3202 Encounter for pregnancy test, result negative: Secondary | ICD-10-CM | POA: Insufficient documentation

## 2013-07-26 DIAGNOSIS — Z833 Family history of diabetes mellitus: Secondary | ICD-10-CM

## 2013-07-26 DIAGNOSIS — F329 Major depressive disorder, single episode, unspecified: Secondary | ICD-10-CM | POA: Insufficient documentation

## 2013-07-26 DIAGNOSIS — G4710 Hypersomnia, unspecified: Secondary | ICD-10-CM | POA: Diagnosis present

## 2013-07-26 DIAGNOSIS — F319 Bipolar disorder, unspecified: Principal | ICD-10-CM | POA: Diagnosis present

## 2013-07-26 DIAGNOSIS — F410 Panic disorder [episodic paroxysmal anxiety] without agoraphobia: Secondary | ICD-10-CM | POA: Diagnosis present

## 2013-07-26 DIAGNOSIS — F332 Major depressive disorder, recurrent severe without psychotic features: Secondary | ICD-10-CM | POA: Diagnosis present

## 2013-07-26 DIAGNOSIS — Z87891 Personal history of nicotine dependence: Secondary | ICD-10-CM | POA: Insufficient documentation

## 2013-07-26 DIAGNOSIS — Z5987 Material hardship due to limited financial resources, not elsewhere classified: Secondary | ICD-10-CM

## 2013-07-26 DIAGNOSIS — Z598 Other problems related to housing and economic circumstances: Secondary | ICD-10-CM

## 2013-07-26 DIAGNOSIS — F3181 Bipolar II disorder: Secondary | ICD-10-CM

## 2013-07-26 LAB — RAPID URINE DRUG SCREEN, HOSP PERFORMED
Amphetamines: NOT DETECTED
Barbiturates: NOT DETECTED
Benzodiazepines: POSITIVE — AB
Cocaine: NOT DETECTED
Opiates: NOT DETECTED
Tetrahydrocannabinol: NOT DETECTED

## 2013-07-26 LAB — CBC
HCT: 36.9 % (ref 36.0–46.0)
HEMOGLOBIN: 12.4 g/dL (ref 12.0–15.0)
MCH: 28.2 pg (ref 26.0–34.0)
MCHC: 33.6 g/dL (ref 30.0–36.0)
MCV: 84.1 fL (ref 78.0–100.0)
Platelets: 286 10*3/uL (ref 150–400)
RBC: 4.39 MIL/uL (ref 3.87–5.11)
RDW: 13.9 % (ref 11.5–15.5)
WBC: 8.2 10*3/uL (ref 4.0–10.5)

## 2013-07-26 LAB — URINALYSIS, ROUTINE W REFLEX MICROSCOPIC
BILIRUBIN URINE: NEGATIVE
Glucose, UA: NEGATIVE mg/dL
Hgb urine dipstick: NEGATIVE
Ketones, ur: NEGATIVE mg/dL
Leukocytes, UA: NEGATIVE
NITRITE: NEGATIVE
PH: 6 (ref 5.0–8.0)
Protein, ur: NEGATIVE mg/dL
SPECIFIC GRAVITY, URINE: 1.027 (ref 1.005–1.030)
Urobilinogen, UA: 0.2 mg/dL (ref 0.0–1.0)

## 2013-07-26 LAB — COMPREHENSIVE METABOLIC PANEL
ALBUMIN: 3.8 g/dL (ref 3.5–5.2)
ALK PHOS: 79 U/L (ref 39–117)
ALT: 22 U/L (ref 0–35)
AST: 19 U/L (ref 0–37)
BUN: 14 mg/dL (ref 6–23)
CALCIUM: 9.8 mg/dL (ref 8.4–10.5)
CO2: 24 mEq/L (ref 19–32)
Chloride: 103 mEq/L (ref 96–112)
Creatinine, Ser: 0.91 mg/dL (ref 0.50–1.10)
GFR calc non Af Amer: 80 mL/min — ABNORMAL LOW (ref >90)
Glucose, Bld: 79 mg/dL (ref 70–99)
POTASSIUM: 4.2 meq/L (ref 3.7–5.3)
SODIUM: 139 meq/L (ref 137–147)
TOTAL PROTEIN: 7.3 g/dL (ref 6.0–8.3)
Total Bilirubin: 0.2 mg/dL — ABNORMAL LOW (ref 0.3–1.2)

## 2013-07-26 LAB — ACETAMINOPHEN LEVEL

## 2013-07-26 LAB — SALICYLATE LEVEL

## 2013-07-26 LAB — ETHANOL

## 2013-07-26 LAB — PREGNANCY, URINE: Preg Test, Ur: NEGATIVE

## 2013-07-26 MED ORDER — CLONAZEPAM 0.5 MG PO TABS: 0.5000 mg | ORAL_TABLET | Freq: Three times a day (TID) | ORAL | Status: DC | PRN

## 2013-07-26 MED ORDER — LORAZEPAM 2 MG/ML IJ SOLN
1.0000 mg | Freq: Once | INTRAMUSCULAR | Status: AC
  Administered 2013-07-26: 1 mg via INTRAVENOUS
  Filled 2013-07-26: qty 1

## 2013-07-26 MED ORDER — BUSPIRONE HCL 10 MG PO TABS
30.0000 mg | ORAL_TABLET | Freq: Two times a day (BID) | ORAL | Status: DC
  Administered 2013-07-26: 30 mg via ORAL
  Filled 2013-07-26: qty 3

## 2013-07-26 MED ORDER — DEXAMETHASONE SODIUM PHOSPHATE 10 MG/ML IJ SOLN
10.0000 mg | Freq: Once | INTRAMUSCULAR | Status: AC
  Administered 2013-07-26: 10 mg via INTRAVENOUS
  Filled 2013-07-26: qty 1

## 2013-07-26 MED ORDER — BUPROPION HCL 75 MG PO TABS
75.0000 mg | ORAL_TABLET | Freq: Every day | ORAL | Status: DC
  Administered 2013-07-27: 75 mg via ORAL
  Filled 2013-07-26 (×4): qty 1

## 2013-07-26 MED ORDER — ONDANSETRON HCL 4 MG PO TABS: 4.0000 mg | ORAL_TABLET | Freq: Three times a day (TID) | ORAL | Status: DC | PRN

## 2013-07-26 MED ORDER — SODIUM CHLORIDE 0.9 % IV BOLUS (SEPSIS)
1000.0000 mL | Freq: Once | INTRAVENOUS | Status: AC
  Administered 2013-07-26: 1000 mL via INTRAVENOUS

## 2013-07-26 MED ORDER — ALUM & MAG HYDROXIDE-SIMETH 200-200-20 MG/5ML PO SUSP: 30.0000 mL | ORAL | Status: DC | PRN

## 2013-07-26 MED ORDER — ACETAMINOPHEN 325 MG PO TABS: 650.0000 mg | ORAL_TABLET | ORAL | Status: DC | PRN

## 2013-07-26 MED ORDER — LURASIDONE HCL 40 MG PO TABS: 60.0000 mg | ORAL_TABLET | Freq: Every day | ORAL | Status: DC

## 2013-07-26 MED ORDER — PROCHLORPERAZINE EDISYLATE 5 MG/ML IJ SOLN
10.0000 mg | Freq: Once | INTRAMUSCULAR | Status: AC
  Administered 2013-07-26: 10 mg via INTRAVENOUS
  Filled 2013-07-26: qty 2

## 2013-07-26 MED ORDER — BUPROPION HCL 75 MG PO TABS: 75.0000 mg | ORAL_TABLET | Freq: Every day | ORAL | Status: DC

## 2013-07-26 MED ORDER — TRAZODONE HCL 50 MG PO TABS: 50.0000 mg | ORAL_TABLET | Freq: Every evening | ORAL | Status: DC | PRN

## 2013-07-26 MED ORDER — IBUPROFEN 200 MG PO TABS: 600.0000 mg | ORAL_TABLET | Freq: Three times a day (TID) | ORAL | Status: DC | PRN

## 2013-07-26 MED ORDER — CLONAZEPAM 0.5 MG PO TABS
0.5000 mg | ORAL_TABLET | Freq: Three times a day (TID) | ORAL | Status: DC | PRN
  Administered 2013-07-27 – 2013-07-29 (×7): 0.5 mg via ORAL
  Filled 2013-07-26 (×7): qty 1

## 2013-07-26 MED ORDER — KETOROLAC TROMETHAMINE 30 MG/ML IJ SOLN
30.0000 mg | Freq: Once | INTRAMUSCULAR | Status: AC
  Administered 2013-07-26: 30 mg via INTRAVENOUS
  Filled 2013-07-26: qty 1

## 2013-07-26 MED ORDER — MAGNESIUM HYDROXIDE 400 MG/5ML PO SUSP: 30.0000 mL | Freq: Every day | ORAL | Status: DC | PRN

## 2013-07-26 MED ORDER — AMBRISENTAN 10 MG PO TABS: 10.0000 mg | ORAL_TABLET | Freq: Every day | ORAL | Status: DC

## 2013-07-26 MED ORDER — NICOTINE 21 MG/24HR TD PT24
21.0000 mg | MEDICATED_PATCH | Freq: Every day | TRANSDERMAL | Status: DC
  Filled 2013-07-26: qty 1

## 2013-07-26 MED ORDER — ACETAMINOPHEN 325 MG PO TABS
650.0000 mg | ORAL_TABLET | Freq: Four times a day (QID) | ORAL | Status: DC | PRN
  Administered 2013-07-27 – 2013-07-28 (×2): 650 mg via ORAL
  Filled 2013-07-26 (×2): qty 2

## 2013-07-26 MED ORDER — BUSPIRONE HCL 15 MG PO TABS
30.0000 mg | ORAL_TABLET | Freq: Two times a day (BID) | ORAL | Status: DC
  Administered 2013-07-27 – 2013-07-30 (×7): 30 mg via ORAL
  Filled 2013-07-26: qty 2
  Filled 2013-07-26: qty 12
  Filled 2013-07-26: qty 2
  Filled 2013-07-26: qty 12
  Filled 2013-07-26 (×8): qty 2

## 2013-07-26 MED ORDER — LURASIDONE HCL 40 MG PO TABS
60.0000 mg | ORAL_TABLET | Freq: Every day | ORAL | Status: DC
  Administered 2013-07-27: 60 mg via ORAL
  Filled 2013-07-26 (×4): qty 2

## 2013-07-26 NOTE — ED Notes (Signed)
Bed: WA28 Expected date:  Expected time:  Means of arrival:  Comments: Hold- Triage 3 

## 2013-07-26 NOTE — ED Notes (Signed)
Notified by ACT team that [facility], however she could not go over until 2230.

## 2013-07-26 NOTE — ED Notes (Signed)
Per pt, states migraine for 2 days and SI for 3 days-psychiatrist is sick and was told to come here

## 2013-07-26 NOTE — ED Provider Notes (Addendum)
TIME SEEN: 5:15 PM  CHIEF COMPLAINT: Suicidal ideation without plan, migraine headache  HPI: Patient is a 38 y.o. female with history of hypertension, migraines, depression who presents to the emergency department with 2 complaints. She states she has had her typical, throbbing, diffuse headache similar to her prior migraine headaches for the past 2 days. It has not improved with ibuprofen. She has had photophobia and nausea but no vomiting. No head injury.  No neck pain or neck stiffness. No fever. She's not on anticoagulation.  No thunderclap headache. She's also complaining suicidal ideation without a plan. She states this has been going on for the past 5 days. She was recently seen by her psychiatrist, Shuggert PA, who increased her Latuda and decreased her Wellbutrin or one week ago but she has not noticed any improvement of her symptoms. She states that she has been losing her temper with her children and is scared she may hurt them. She denies any hallucinations. No turgor alcohol use. She is here voluntarily.  ROS: See HPI Constitutional: no fever  Eyes: no drainage  ENT: no runny nose   Cardiovascular:  no chest pain  Resp: no SOB  GI: no vomiting GU: no dysuria Integumentary: no rash  Allergy: no hives  Musculoskeletal: no leg swelling  Neurological: no slurred speech ROS otherwise negative  PAST MEDICAL HISTORY/PAST SURGICAL HISTORY:  Past Medical History  Diagnosis Date  . Hypertension     gestational  . Hemorrhoid   . Migraine   . Anemia   . Depression   . Anxiety   . Blood transfusion without reported diagnosis     MEDICATIONS:  Prior to Admission medications   Medication Sig Start Date End Date Taking? Authorizing Provider  ambrisentan (LETAIRIS) 10 MG tablet Take 10 mg by mouth at bedtime.   Yes Historical Provider, MD  buPROPion (WELLBUTRIN) 75 MG tablet Take 1 tablet (75 mg total) by mouth daily. 07/21/12  Yes Nanine Means, NP  busPIRone (BUSPAR) 30 MG tablet  Take 30 mg by mouth 2 (two) times daily.   Yes Historical Provider, MD  clonazePAM (KLONOPIN) 0.5 MG tablet Take 0.5 mg by mouth 3 (three) times daily as needed for anxiety.   Yes Historical Provider, MD  Lurasidone HCl (LATUDA) 60 MG TABS Take 1 tablet by mouth daily.   Yes Historical Provider, MD    ALLERGIES:  No Known Allergies  SOCIAL HISTORY:  History  Substance Use Topics  . Smoking status: Former Smoker -- 10 years    Types: Cigarettes  . Smokeless tobacco: Never Used     Comment: smoked 1994-2012, up to 1 pp WEEK  . Alcohol Use: No     Comment: socially    FAMILY HISTORY: Family History  Problem Relation Age of Onset  . Osteoarthritis Mother   . Diabetes Mother   . Hypertension Mother   . Depression Mother   . Hypertension Father   . Diabetes Father   . Asthma Son   . Stroke Neg Hx   . Deep vein thrombosis Maternal Grandmother   . Deep vein thrombosis Maternal Grandfather     EXAM: BP 121/78  Pulse 95  Temp(Src) 98.5 F (36.9 C) (Oral)  Resp 20  SpO2 97%  LMP 07/12/2013 CONSTITUTIONAL: Alert and oriented and responds appropriately to questions. Well-appearing; well-nourished HEAD: Normocephalic EYES: Conjunctivae clear, PERRL ENT: normal nose; no rhinorrhea; moist mucous membranes; pharynx without lesions noted NECK: Supple, no meningismus, no LAD  CARD: RRR; S1 and S2 appreciated;  no murmurs, no clicks, no rubs, no gallops RESP: Normal chest excursion without splinting or tachypnea; breath sounds clear and equal bilaterally; no wheezes, no rhonchi, no rales,  ABD/GI: Normal bowel sounds; non-distended; soft, non-tender, no rebound, no guarding BACK:  The back appears normal and is non-tender to palpation, there is no CVA tenderness EXT: Normal ROM in all joints; non-tender to palpation; no edema; normal capillary refill; no cyanosis    SKIN: Normal color for age and race; warm NEURO: Moves all extremities equally, cranial nerves 2 through contact,  sensation to light touch intact diffusely PSYCH: The patient endorses suicidal ideation without plan. She has flat affect. Grooming and personal hygiene are appropriate.  MEDICAL DECISION MAKING: Patient here voluntarily with suicidal ideation without plan. She is also complaining of her typical migraine headache. We'll give IV fluids, Decadron and Compazine and reassess. We'll also obtain basic labs and urine for medical clearance. Will consult psychiatry.  ED PROGRESS: Patient is still having headache after Compazine and Decadron. We'll give Toradol. Her labs and urine are remarkable other than UDS positive for benzodiazepines. She is currently medically cleared awaiting psychiatry evaluation.  8:03 PM  Pt's headache is now 2/10 after Toradol. She states she is feeling mildly anxious and would like something for sleep. We'll give IV Ativan.  9:01 PM  [facility]   Layla MawKristen N Ward, DO 07/26/13 2004  Layla MawKristen N Ward, DO 07/26/13 2101

## 2013-07-26 NOTE — ED Notes (Signed)
[facility] transportation notified and will be to pick up patient soon.

## 2013-07-26 NOTE — BH Assessment (Signed)
Assessment Note  Marilyn Fowler is an 38 y.o. female who presents [facility] ED with suicidal ideation and a plan to overdose or drive her car into traffic.  She reports that she has been experiencing worsening depression for the last several weeks and recently started experiencing SI as well.  She called her psychiatrist, but he was out and the office suggested she come to the Emergency Room.  She reports feelings of worthlessness, irritability, erratic mood, isolating herself to her room, inability to get out of bed, increased sleep, increased appetite-gaining 20 lbs in the last month, decreased concentration, crying spells and anhedonia.  She admits that her mood has made it very difficult to be with her two sons, ages 327 and [id number], because she is very short tempered with them and angry.  She also reports missing a week of work due to not being able to get out of bed.  She state she just wants to be alone, eat, and sleep.  She denies any previous attempts or SI, but experienced similar symptoms around this time last year, when she was hospitalized [facility].  [facility], but does not see a therapist.  She reports he recently discontinued her Abilify and started her on Latuda.   She denies HI, now or in the last six months, or AVH.  She reports some past history with substance abuse many years ago.  She is appropriate for inpatient treatment for crisis stabilization.      Axis I: Bipolar, Depressed Axis II: Deferred Axis III:  Past Medical History  Diagnosis Date  . Hypertension     gestational  . Hemorrhoid   . Migraine   . Anemia   . Depression   . Anxiety   . Blood transfusion without reported diagnosis    Axis IV: economic problems, occupational problems and problems with access to health care services Axis V: 41-50 serious symptoms  Past Medical History:  Past Medical History  Diagnosis Date  . Hypertension     gestational  . Hemorrhoid    . Migraine   . Anemia   . Depression   . Anxiety   . Blood transfusion without reported diagnosis     Past Surgical History  Procedure Laterality Date  . Ventral hernia repair    . Hemorrhoid surgery      Oct 2011  . Tonsillectomy    . Hernia repair    . Hemorrhoid surgery      Family History:  Family History  Problem Relation Age of Onset  . Osteoarthritis Mother   . Diabetes Mother   . Hypertension Mother   . Depression Mother   . Hypertension Father   . Diabetes Father   . Asthma Son   . Stroke Neg Hx   . Deep vein thrombosis Maternal Grandmother   . Deep vein thrombosis Maternal Grandfather     Social History:  reports that she has quit smoking. Her smoking use included Cigarettes. She smoked 0.00 packs per day for 10 years. She has never used smokeless tobacco. She reports that she does not drink alcohol or use illicit drugs.  Additional Social History:  Alcohol / Drug Use History of alcohol / drug use?: No history of alcohol / drug abuse  CIWA: CIWA-Ar BP: 140/74 mmHg Pulse Rate: 88 COWS:    Allergies: No Known Allergies  Home Medications:  (Not in a hospital admission)  OB/GYN Status:  Patient's last menstrual period  was 07/12/2013.  General Assessment Data Is this a Tele or Face-to-Face Assessment?: Face-to-Face Is this an Initial Assessment or a Re-assessment for this encounter?: Initial Assessment Living Arrangements: Children;Parent (mother and two sons 63 and 7) Can pt return to current living arrangement?: Yes Admission Status: Voluntary Is patient capable of signing voluntary admission?: Yes Transfer from: Acute Hospital Referral Source: Self/Family/Friend     [facility] Crisis Care Plan Living Arrangements: Children;Parent (mother and two sons 61 and 7) Name of Psychiatrist: [facility] [employer] Name of Therapist: NA  Education Status Is patient currently in school?: No Highest grade of school patient has completed: some  college  Risk to self Suicidal Ideation: Yes-Currently Present Suicidal Intent: No-Not Currently/Within Last 6 Months Is patient at risk for suicide?: Yes Suicidal Plan?: Yes-Currently Present Specify Current Suicidal Plan: drive car into traffic, OD Access to Means: Yes Specify Access to Suicidal Means: environmental, medication What has been your use of drugs/alcohol within the last 12 months?: sober for severalyears Previous Attempts/Gestures: No How many times?: 0 Intentional Self Injurious Behavior: None Family Suicide History: Yes (Cousin-made attempt, mother-biplar) Recent stressful life event(s): Turmoil (Comment);Financial Problems Persecutory voices/beliefs?: No Depression: Yes Depression Symptoms: Despondent;Insomnia;Tearfulness;Isolating;Fatigue;Loss of interest in usual pleasures;Feeling worthless/self pity;Feeling angry/irritable Substance abuse history and/or treatment for substance abuse?: No Suicide prevention information given to non-admitted patients: Not applicable  Risk to Others Homicidal Ideation: No Thoughts of Harm to Others: No Current Homicidal Intent: No Current Homicidal Plan: No Access to Homicidal Means: No History of harm to others?: No Assessment of Violence: None Noted Does patient have access to weapons?: No Criminal Charges Pending?: No Does patient have a court date: No  Psychosis Hallucinations: None noted Delusions: None noted  Mental Status Report Appear/Hygiene: Other (Comment) (unremarkable) Eye Contact: Good Motor Activity: Freedom of movement Speech: Logical/coherent Level of Consciousness: Alert Mood: Depressed Affect: Depressed Anxiety Level: Panic Attacks Panic attack frequency: 1-2 per week Most recent panic attack: 4 days ago Thought Processes: Coherent;Relevant Judgement: Unimpaired Orientation: Person;Place;Time;Situation Obsessive Compulsive Thoughts/Behaviors: Moderate  Cognitive Functioning Concentration:  Decreased Memory: Recent Impaired;Remote Intact IQ: Average Insight: Good Impulse Control: Poor Appetite: Good Weight Loss: 0 Weight Gain: 20 (in one month) Sleep: Increased Total Hours of Sleep: 12 Vegetative Symptoms: Staying in bed;Decreased grooming  ADLScreening [facility] Assessment Services) Patient's cognitive ability adequate to safely complete daily activities?: Yes Patient able to express need for assistance with ADLs?: Yes Independently performs ADLs?: Yes (appropriate for developmental age)  Prior Inpatient Therapy Prior Inpatient Therapy: Yes Prior Therapy Dates: March 2014 Prior Therapy Facilty/Provider(s): [facility] Reason for Treatment: SI  Prior Outpatient Therapy Prior Outpatient Therapy: Yes Prior Therapy Dates: ongoing [facility] Therapy [facility]/Provider[facility] Reason for Treatment: Depression  ADL Screening (condition at time of admission) Patient's cognitive ability adequate to safely complete daily activities?: Yes Patient able to express need for assistance with ADLs?: Yes Independently performs ADLs?: Yes (appropriate for developmental age)       Abuse/Neglect Assessment (Assessment to be complete while patient is alone) Physical Abuse: Denies Verbal Abuse: Denies Sexual Abuse: Denies Values / Beliefs Cultural Requests During Hospitalization: None Spiritual Requests During Hospitalization: None   Advance Directives (For Healthcare) Advance Directive: Patient does not have advance directive Nutrition Screen- [facility] Patient's home diet: Regular  Additional Information 1:1 In Past 12 Months?: No CIRT Risk: No Elopement Risk: No Does patient have medical clearance?: Yes     Disposition:  Disposition Initial Assessment Completed for this Encounter: Yes Disposition of Patient: Inpatient  treatment program Type of inpatient treatment program: Adult  On Site Evaluation by:   Reviewed with Physician:    Steward Ros 07/26/2013 8:32 PM

## 2013-07-27 ENCOUNTER — Encounter (HOSPITAL_COMMUNITY): Payer: Self-pay | Admitting: *Deleted

## 2013-07-27 DIAGNOSIS — F39 Unspecified mood [affective] disorder: Secondary | ICD-10-CM

## 2013-07-27 DIAGNOSIS — F3189 Other bipolar disorder: Secondary | ICD-10-CM

## 2013-07-27 MED ORDER — LAMOTRIGINE 25 MG PO TABS
25.0000 mg | ORAL_TABLET | Freq: Every day | ORAL | Status: DC
  Administered 2013-07-27 – 2013-07-28 (×2): 25 mg via ORAL
  Filled 2013-07-27 (×5): qty 1

## 2013-07-27 MED ORDER — ZOLPIDEM TARTRATE 5 MG PO TABS
5.0000 mg | ORAL_TABLET | Freq: Every evening | ORAL | Status: DC | PRN
  Administered 2013-07-27: 5 mg via ORAL
  Filled 2013-07-27: qty 1

## 2013-07-27 MED ORDER — ONDANSETRON HCL 4 MG PO TABS
4.0000 mg | ORAL_TABLET | Freq: Three times a day (TID) | ORAL | Status: DC | PRN
  Administered 2013-07-27 – 2013-07-29 (×2): 4 mg via ORAL
  Filled 2013-07-27 (×2): qty 1

## 2013-07-27 MED ORDER — VENLAFAXINE HCL 37.5 MG PO TABS
37.5000 mg | ORAL_TABLET | ORAL | Status: DC
  Filled 2013-07-27 (×3): qty 1

## 2013-07-27 MED ORDER — IBUPROFEN 600 MG PO TABS
600.0000 mg | ORAL_TABLET | Freq: Four times a day (QID) | ORAL | Status: DC | PRN
  Administered 2013-07-27: 600 mg via ORAL
  Filled 2013-07-27: qty 1

## 2013-07-27 MED ORDER — AMLODIPINE BESYLATE 2.5 MG PO TABS
2.5000 mg | ORAL_TABLET | Freq: Every day | ORAL | Status: DC
  Administered 2013-07-27: 2.5 mg via ORAL
  Administered 2013-07-28 – 2013-07-29 (×2): via ORAL
  Administered 2013-07-30: 2.5 mg via ORAL
  Filled 2013-07-27 (×5): qty 1
  Filled 2013-07-27: qty 3

## 2013-07-27 MED ORDER — TRAZODONE HCL 50 MG PO TABS
50.0000 mg | ORAL_TABLET | Freq: Every evening | ORAL | Status: DC | PRN
  Administered 2013-07-27: 50 mg via ORAL
  Filled 2013-07-27 (×4): qty 1

## 2013-07-27 MED ORDER — SUMATRIPTAN SUCCINATE 50 MG PO TABS
50.0000 mg | ORAL_TABLET | Freq: Once | ORAL | Status: AC
  Administered 2013-07-27: 50 mg via ORAL
  Filled 2013-07-27: qty 1

## 2013-07-27 MED ORDER — HYDROXYZINE HCL 50 MG PO TABS
50.0000 mg | ORAL_TABLET | Freq: Four times a day (QID) | ORAL | Status: DC | PRN
  Administered 2013-07-28 – 2013-07-29 (×3): 50 mg via ORAL
  Filled 2013-07-27 (×3): qty 1
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
## 2013-07-27 NOTE — [facility] Suicide Risk Assessment (Signed)
   Nursing information obtained from:  Patient Demographic factors:  Caucasian Current Mental Status:  Suicidal ideation indicated by patient Loss Factors:  NA Historical Factors:  NA Risk Reduction Factors:  Responsible for children under 37 years of age;Employed Total Time spent with patient: 45 minutes  CLINICAL FACTORS:   Depression:   Anhedonia Delusional Hopelessness Impulsivity Insomnia Recent sense of peace/wellbeing Severe Unstable or Poor Therapeutic Relationship Previous Psychiatric Diagnoses and Treatments Medical Diagnoses and Treatments/Surgeries  Psychiatric Specialty Exam: Physical Exam  Review of Systems  Neurological: Positive for headaches.  Psychiatric/Behavioral: Positive for depression and suicidal ideas. The patient is nervous/anxious and has insomnia.   All other systems reviewed and are negative.    Blood pressure 126/79, pulse 83, temperature 96.1 F (35.6 C), temperature source Oral, resp. rate 16, height 5\' 1"  (1.549 m), weight 113.399 kg (250 lb), last menstrual period 07/12/2013.Body mass index is 47.26 kg/(m^2).  General Appearance: Casual and Fairly Groomed  Patent attorneyye Contact::  Fair  Speech:  Clear and Coherent  Volume:  Decreased  Mood:  Anxious, Depressed, Hopeless and Worthless  Affect:  Depressed and Flat  Thought Process:  Goal Directed and Intact  Orientation:  Full (Time, Place, and Person)  Thought Content:  WDL  Suicidal Thoughts:  Yes.  without intent/plan  Homicidal Thoughts:  No  Memory:  Immediate;   Fair  Judgement:  Fair  Insight:  Fair  Psychomotor Activity:  Psychomotor Retardation and Restlessness  Concentration:  Fair  Recall:  Good  Fund of Knowledge:Good  Language: Good  Akathisia:  NA  Handed:  Right  AIMS (if indicated):     Assets:  Communication Skills Desire for Improvement Financial Resources/Insurance Housing Leisure Time Physical Health Resilience Social Support Talents/Skills Transportation  Sleep:   Number of Hours: 4   Musculoskeletal: Strength & Muscle Tone: within normal limits Gait & Station: normal Patient leans: N/A  COGNITIVE FEATURES THAT CONTRIBUTE TO RISK:  Closed-mindedness Loss of executive function Polarized thinking Thought constriction (tunnel vision)    SUICIDE RISK:   Moderate:  Frequent suicidal ideation with limited intensity, and duration, some specificity in terms of plans, no associated intent, good self-control, limited dysphoria/symptomatology, some risk factors present, and identifiable protective factors, including available and accessible social support.  PLAN OF CARE: Admit for crisis stabilization, safety monitoring and medication management of major depressive disorder recurrent with suicidal ideation. Patient has multiple psychosocial stressors.  I certify that inpatient services furnished can reasonably be expected to improve the patient's condition.  Debarah Mccumbers,JANARDHAHA R. 07/27/2013, 1:23 PM

## 2013-07-27 NOTE — Progress Notes (Addendum)
Patient stated she needs migraine medication.  Refused tylenol this morning.  Has felt nauseated, vomited this morning once because of headache.  Ginger ale given.  D:  Patient's self inventory sheet, patient sleeps well, has good appetite, low energy level, good attention span.  Rated depression, hopeless, anxiety 10.  Denied withdrawals.  Denied SI, contracts for safety.  Denied physical problems.  Plans to discharge home.  No problems taking meds after discharge. A:  Medications administered per MD orders.   Emotional support and encouragement given patient. R:  Denied SI and HI.   Contracts for safety.  Denied A/V hallucinations.   Will continue to monitor patient for safety with 15 minute checks.  Safety maintained.  1031  Patient continues to lay in bed, pain med given for headache.  Will continue to monitor patient for safety with 15 minute checks.  Safety maintained.  1630  Patient requested nurse to remove from her locker #49, contact solution, contact case and set of contacts.  Nurse with security guard removed these items, put items in brown bag and put patient's label on bag, bag in 500 medication room on top of med cart.  Patient stated she is feeling much better this afternoon, headache #2 pain, went to dining room for dinner.  Patient has been smiling in dayroom, talking with peers and staff.

## 2013-07-27 NOTE — [facility] Suicide Risk Assessment (Signed)
[facility] INPATIENT:  Family/Significant Other Suicide Prevention Education  Suicide Prevention Education:  Education Completed; Marilyn SitesMary Jane Fowler, Mother [phone]; has been identified by the patient as the family member/significant other with whom the patient will be residing, and identified as the person(s) who will aid the patient in the event of a mental health crisis (suicidal ideations/suicide attempt).  With written consent from the patient, the family member/significant other has been provided the following suicide prevention education, prior to the and/or following the discharge of the patient.  The suicide prevention education provided includes the following:  Suicide risk factors  Suicide prevention and interventions  National Suicide Hotline telephone number  [facility] assessment telephone number  [facility] Emergency Assistance 911  [facility] and/or [facility]idential Mobile Crisis Unit telephone number  Request made of family/significant other to:  Remove weapons (e.g., guns, rifles, knives), all items previously/currently identified as safety concern.  Mother advised patient does not have access to weapons.  Family member to secure guns prior to patient discharging.    Remove drugs/medications (over-the-counter, prescriptions, illicit drugs), all items previously/currently identified as a safety concern.  The family member/significant other verbalizes understanding of the suicide prevention education information provided.  The family member/significant other agrees to remove the items of safety concern listed above.  Marilyn Fowler, Marilyn Fowler 07/27/2013, 3:43 PM

## 2013-07-27 NOTE — Progress Notes (Signed)
The focus of this group is to educate the patient on the purpose and policies of crisis stabilization and provide a format to answer questions about their admission.  The group details unit policies and expectations of patients while admitted.  Patient did not attend 0900 nurse education orientation group this morning.  Patient was in bed, sleeping.

## 2013-07-27 NOTE — Tx Team (Signed)
Initial Interdisciplinary Treatment Plan  PATIENT STRENGTHS: (choose at least two) Ability for insight Average or above average intelligence Financial means Motivation for treatment/growth Physical Health Supportive family/friends  PATIENT STRESSORS: Medication change or noncompliance   PROBLEM LIST: Problem List/Patient Goals Date to be addressed Date deferred Reason deferred Estimated date of resolution  Depression      Suicide Thoughts                                                 DISCHARGE CRITERIA:  Adequate post-discharge living arrangements Medical problems require only outpatient monitoring Motivation to continue treatment in a less acute level of care Verbal commitment to aftercare and medication compliance  PRELIMINARY DISCHARGE PLAN: Return to previous living arrangement Return to previous work or school arrangements  PATIENT/FAMIILY INVOLVEMENT: This treatment plan has been presented to and reviewed with the patient, Marilyn Fowler, and/or family member.  The patient and family have been given the opportunity to ask questions and make suggestions.  Marilyn Fowler, Marilyn Fowler 07/27/2013, 12:38 AM

## 2013-07-27 NOTE — [facility] Group Notes (Signed)
[facility] LCSW Group Therapy  Feelings Around Diagnosis 1:15 - 2:30 PM  07/27/2013 3:23 PM  Type of Therapy:  Group Therapy  Participation Level:  Did Not Attend   Wynn BankerHodnett, Marilyn Fowler 07/27/2013, 3:23 PM
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
## 2013-07-27 NOTE — Progress Notes (Signed)
Admission Note: This is a 38 years old Caucasian female admitted to this unit for Bipolar,Depession. She came in due to increased depression and SI with plan to overdose or drive her car into traffic. She said she was treated here about a year ago and has been doing well until her psychiatrist recently changed her medication from Abilify to JordanLatuda. She said she's been expressing symptoms like insomnia, irritability increased appetite and has gained weight. She denied  prior history of suicide attempts, denied drug and alcohol use, denied SI/HI during the assessment and denied Hallucinations. She appeared calm and cooperative during the assessment. Thought process, speech and skin assessment were all within normal limit. Staff offered patient meal, oriented her to the unit and Q 15 minute check initiated.
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
## 2013-07-27 NOTE — Progress Notes (Signed)
Recreation Therapy Notes  Animal-Assisted Activity/Therapy (AAA/T) Program Checklist/Progress Notes Patient Eligibility Criteria Checklist & Daily Group note for Rec Tx Intervention  Date: 03.30.2015 Time: 2:45pm Location: [address] Dayroom    AAA/T Program Assumption of Risk Form signed by Patient/ or Parent Legal Guardian yes  Patient is free of allergies or sever asthma yes  Patient reports no fear of animals yes  Patient reports no history of cruelty to animals yes   Patient understands his/her participation is voluntary yes  Behavioral Response: Did not attend.   Latara Micheli L Rhonda Vangieson, LRT/CTRS  Seleny Allbright L 07/27/2013 5:14 PM 

## 2013-07-28 MED ORDER — LAMOTRIGINE 25 MG PO TABS
50.0000 mg | ORAL_TABLET | Freq: Every day | ORAL | Status: AC
  Administered 2013-07-29: 50 mg via ORAL
  Filled 2013-07-28 (×2): qty 2

## 2013-07-28 MED ORDER — BUPROPION HCL ER (XL) 150 MG PO TB24
150.0000 mg | ORAL_TABLET | Freq: Every day | ORAL | Status: DC
  Administered 2013-07-28 – 2013-07-30 (×3): 150 mg via ORAL
  Filled 2013-07-28 (×6): qty 1
  Filled 2013-07-28: qty 3

## 2013-07-28 MED ORDER — ZOLPIDEM TARTRATE 10 MG PO TABS
10.0000 mg | ORAL_TABLET | Freq: Every evening | ORAL | Status: DC | PRN
  Administered 2013-07-28 – 2013-07-29 (×2): 10 mg via ORAL
  Filled 2013-07-28 (×2): qty 1

## 2013-07-28 NOTE — Tx Team (Signed)
Interdisciplinary Treatment Plan Update   Date Reviewed:  07/28/2013  Time Reviewed:  8:34 AM  Progress in Treatment:   Attending groups: Yes Participating in groups: Yes Taking medication as prescribed: Yes  Tolerating medication: Yes Family/Significant other contact made:  Yes, collateral contact with mother. Patient understands diagnosis: Yes  Discussing patient identified problems/goals with staff: Yes Medical problems stabilized or resolved: Yes Denies suicidal/homicidal ideation: Yes Patient has not harmed self or others: Yes  For review of initial/current patient goals, please see plan of care.  Estimated Length of Stay:  2-4 days  Reasons for Continued Hospitalization:  Anxiety Depression Medication stabilization   New Problems/Goals identified:    Discharge Plan or Barriers:   Home with outpatient follow up with [facility]  Additional Comments:   This is the second admission for Marilyn Fowler who is a 38 year old separated WF mother of two children ages 237 and 611. She reported to her out patient provider that she was having increasing symptoms over the past two to three weeks of worsening depression, with suicidal ideation. She reports fleeting thoughts, no plans, no intent, no prior attempts. She also reports increasing lethargy, poor hygiene, staying in bed all day, crying 2/7 days, increas anxiety, increased appetite, isolation from friends and family, increased irritability, mood swings.   Attendees:  Patient:  07/28/2013 8:34 AM   Signature: Mervyn GayJ. Jonnalagadda, MD 07/28/2013 8:34 AM  Signature:  07/28/2013 8:34 AM  Signature:  Claudette Headonrad Withrow, NP 07/28/2013 8:34 AM  Signature:  Quintella ReichertBeverly Knight, RN 07/28/2013 8:34 AM  Signature:   07/28/2013 8:34 AM  Signature:  Juline PatchQuylle Maudean Hoffmann, LCSW 07/28/2013 8:34 AM  Signature:  Reyes Ivanhelsea Horton, LCSW 07/28/2013 8:34 AM  Signature:  Leisa LenzValerie Enoch, Care Coordinator [facility] 07/28/2013 8:34 AM  Signature:  Aloha GellKrista Dopson, RN 07/28/2013 8:34 AM   Signature: 07/28/2013  8:34 AM  Signature:   Onnie BoerJennifer Clark, RN [facility] 07/28/2013  8:34 AM  Signature:  Harold Barbanonecia Byrd, RN 07/28/2013  8:34 AM    Scribe for Treatment Team:   Juline PatchQuylle Jarryd Gratz,  07/28/2013 8:34 AM

## 2013-07-28 NOTE — Progress Notes (Signed)
D: Patient's affect appropriate to circumstance and mood is anxious. She reported on the self inventory sheet that sleep is poor, appetite is good, low energy level and improving ability to pay attention. Patient rates depression today "7" and feelings of hopelessness "3". She's actively participating in groups and interacting with peers. Patient is compliant with medications.  A: Support and encouragement provided to patient. Scheduled medications administered per MD orders. Maintain Q15 minute checks for safety.  R: Patient receptive. Denies SI/HI and AVH. Patient remains safe.
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
## 2013-07-28 NOTE — Progress Notes (Signed)
Pt awake in bed. Pt stated she is having racing thoughts and insomnia. PA notified, vistaril 50mg ordered and given.  q 15 min safety checks. Pt remains safe on unit 
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
## 2013-07-28 NOTE — [facility] Group Notes (Signed)
[facility]   07/28/2013 9:53 AM    Participation Quality:  Appropraite  Mood/Affect:  Appropriate  Depression Rating:  6  Anxiety Rating:  6  Thoughts of Suicide:  No  Will you contract for safety?   NA  Current AVH:  No  Plan for Discharge/Comments:  Patient attended discharge planning group and actively participated in group.  She reports doing much better today.  [facility]  CSW provided all participants with daily workbook.   Transportation Means: Patient has transportation.   Supports:  Patient has a support system.   Tim Wilhide, Joesph JulyQuylle Hairston

## 2013-07-29 DIAGNOSIS — F3130 Bipolar disorder, current episode depressed, mild or moderate severity, unspecified: Secondary | ICD-10-CM

## 2013-07-29 MED ORDER — CLONAZEPAM 0.5 MG PO TABS
0.5000 mg | ORAL_TABLET | Freq: Three times a day (TID) | ORAL | Status: DC
  Administered 2013-07-29 – 2013-07-30 (×4): 0.5 mg via ORAL
  Filled 2013-07-29 (×4): qty 1

## 2013-07-29 MED ORDER — SUMATRIPTAN SUCCINATE 50 MG PO TABS
50.0000 mg | ORAL_TABLET | Freq: Once | ORAL | Status: AC
  Administered 2013-07-29: 50 mg via ORAL
  Filled 2013-07-29 (×2): qty 1

## 2013-07-29 NOTE — Progress Notes (Signed)
Patient ID: Marilyn Fowler, female   DOB: Dec 30, 1975, 38 y.o.   MRN: [id number] D: pt. Visible on the unit in dayroom watching TV, Pt. Reports feeling better "had a headache all day, stayed in bed" Pt. Reports depression at "3" of 10 and anxiety at "7" of 10. Pt. Denies SHI. A:Writer reviewed medications, provided emotional support. Staff will monitor q215min for safety. R: Pt. Is safe in the unit and attended group.

## 2013-07-29 NOTE — Progress Notes (Signed)
Adult Psychoeducational Group Note  Date:  07/29/2013 Time:  10:00am Group Topic/Focus:  Personal Choices and Values:   The focus of this group is to help patients assess and explore the importance of values in their lives, how their values affect their decisions, how they express their values and what opposes their expression.  Participation Level:  Active  Participation Quality:  Appropriate and Attentive  Affect:  Appropriate  Cognitive:  Alert and Appropriate  Insight: Appropriate  Engagement in Group:  Engaged  Modes of Intervention:  Discussion and Education  Additional Comments:  Pt attended and participated in group. Discussion was on making lifestyle changes. Pt was asked what is one change they could make to improve quality of life. Pt stated learning to say know.  Shelly BombardGarner, Kolette Vey D 07/29/2013, 6:49 PM

## 2013-07-29 NOTE — Progress Notes (Signed)
[facility] Group Notes:  (Nursing/MHT/Case Management/Adjunct)  Date:  07/29/2013  Time:  2100 Type of Therapy:  wrap up group  Participation Level:  Minimal  Participation Quality:  Appropriate, Attentive and Supportive  Affect:  Appropriate  Cognitive:  Appropriate  Insight:  Appropriate  Engagement in Group:  Engaged  Modes of Intervention:  Clarification, Education and Support  Summary of Progress/Problems:  Shelah LewandowskySquires, Dyson Sevey Carol 07/29/2013, 10:52 PM

## 2013-07-29 NOTE — [facility] Group Notes (Signed)
[facility] LCSW Group Therapy  Living A Balanced Life  1:15 - 2: 30          07/29/2013    Type of Therapy:  Group Therapy  Participation Level: Did not attend group.  Wynn BankerHodnett, Miklos Bidinger Hairston 07/29/2013
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
## 2013-07-29 NOTE — Progress Notes (Signed)
D: Patient appropriate and cooperative with staff. Patient's affect is blunted and mood is depressed; slightly anxious at times. Complained of nausea and anxiety 6/10. She reported on the self inventory sheet that sleep and appetite are poor, energy level is low and ability to pay attention is improving. Patient rated depression "5" and feelings of hopelessness "1". She's attending groups. Adheres to medication regimen.  A: Support and encouragement provided to patient. Administered scheduled medications per ordering MD. Patient given Zofran for nausea and Klonopin to help with anxiety and agitation. Monitor Q15 minute checks for safety.  R: Patient receptive. Denies SI/HI. Patient remains safe on the unit.

## 2013-07-29 NOTE — Progress Notes (Signed)
Patient ID: Marilyn Fowler, female   DOB: 1976/03/05, 38 y.o.   MRN: [id number]  Morning Wellness Group 9 A.M.   The focus of this group is to educate the patient on the purpose and policies of crisis stabilization and provide a format to answer questions about their admission.  The group details unit policies and expectations of patients while admitted.  Patient attended group and was attentive. Patient reported that her goal for the day is to "get used to the new meds." Patient had no questions or concerns in group.

## 2013-07-30 MED ORDER — BUSPIRONE HCL 30 MG PO TABS: 30.0000 mg | ORAL_TABLET | Freq: Two times a day (BID) | ORAL | Status: DC

## 2013-07-30 MED ORDER — AMLODIPINE BESYLATE 2.5 MG PO TABS: 2.5000 mg | ORAL_TABLET | Freq: Every day | ORAL | Status: DC

## 2013-07-30 MED ORDER — ZOLPIDEM TARTRATE 10 MG PO TABS: 10.0000 mg | ORAL_TABLET | Freq: Every evening | ORAL | Status: DC | PRN

## 2013-07-30 MED ORDER — CLONAZEPAM 0.5 MG PO TABS: 0.5000 mg | ORAL_TABLET | Freq: Three times a day (TID) | ORAL | Status: DC | PRN

## 2013-07-30 MED ORDER — BUPROPION HCL ER (XL) 150 MG PO TB24: 150.0000 mg | ORAL_TABLET | Freq: Every day | ORAL | Status: DC

## 2013-07-30 NOTE — Tx Team (Signed)
Interdisciplinary Treatment Plan Update (Adult)  Date: 07/30/2013  Time Reviewed:  9:45 AM  Progress in Treatment: Attending groups: Yes Participating in groups:  Yes Taking medication as prescribed:  Yes Tolerating medication:  Yes Family/Significant othe contact made: Yes, with pt's mother Patient understands diagnosis:  Yes Discussing patient identified problems/goals with staff:  Yes Medical problems stabilized or resolved:  Yes Denies suicidal/homicidal ideation: Yes Issues/concerns per patient self-inventory:  Yes Other:  New problem(s) identified: N/A  Discharge Plan or Barriers: Pt will [facility] for [facility] medication management.    Reason for Continuation of Hospitalization: Stable to d/c today  Comments: N/A  Estimated length of stay: D/C today  For review of initial/current patient goals, please see plan of care.  Attendees: Patient:  Marilyn Fowler  07/30/2013 10:42 AM   Family:     Physician:  Dr. Javier GlazierJohnalagadda 07/30/2013 10:28 AM   Nursing:   Harold Barbanonecia Byrd, RN 07/30/2013 10:28 AM   Clinical Social Worker:  Reyes Ivanhelsea Horton, LCSW 07/30/2013 10:28 AM   Other: Lamount Crankerhris Judge, RN 07/30/2013 10:28 AM   Other:  Sherrye PayorValerie Noch, care coordination 07/30/2013 10:28 AM   Other:  Onnie BoerJennifer Clark, case manager 07/30/2013 10:28 AM   Other:     Other:    Other:    Other:    Other:    Other:    Other:     Scribe for Treatment Team:   Carmina MillerHorton, Ayansh Feutz Nicole, 07/30/2013 10:28 AM

## 2013-07-30 NOTE — Discharge Summary (Signed)
Physician Discharge Summary Note  Patient:  Marilyn Fowler is an 38 y.o., female MRN:  [id number] DOB:  1975-08-07 Patient phone:  [phone] (home)  Patient address:   [address] [place] [address],  Total Time spent with patient: 30 minutes  Date of Admission:  07/26/2013 Date of Discharge: 07/30/2013  Reason for Admission:    Discharge Diagnoses: Principal Problem:   Mood disorder   Psychiatric Specialty Exam: Physical Exam  ROS  Blood pressure 117/83, pulse 102, temperature 98 F (36.7 C), temperature source Oral, resp. rate 16, height 5\' 1"  (1.549 m), weight 113.399 kg (250 lb), last menstrual period 07/12/2013.Body mass index is 47.26 kg/(m^2).  General Appearance:   Eye Contact::    Speech:    Volume:    Mood:    Affect:    Thought Process:    Orientation:    Thought Content:    Suicidal Thoughts:    Homicidal Thoughts:    Memory:    Judgement:    Insight:    Psychomotor Activity:    Concentration:    Recall:    Fund of Knowledge:  Language:   Akathisia:    Handed:    AIMS (if indicated):     Assets:    Sleep:  Number of Hours: 3.25    Past Psychiatric History: Diagnosis:  Hospitalizations:  Outpatient Care:  Substance Abuse Care:  Self-Mutilation:  Suicidal Attempts:  Violent Behaviors:   Musculoskeletal: Strength & Muscle Tone: within normal limits Gait & Station: normal Patient leans: N/A  DSM5:  Schizophrenia Disorders:   Obsessive-Compulsive Disorders:   Trauma-Stressor Disorders:   Substance/Addictive Disorders:   Depressive Disorders:    Axis Diagnosis:  AXIS I: Bipolar, Depressed  AXIS II: Deferred  AXIS III:  Past Medical History   Diagnosis  Date   .  Hypertension      gestational   .  Hemorrhoid    .  Migraine    .  Anemia    .  Depression    .  Anxiety    .  Blood transfusion without reported diagnosis     AXIS IV: other psychosocial or environmental problems, problems related to social environment and  problems with primary support group  AXIS V: 61-70 mild symptoms   Level of Care:  OP  Hospital Course: Marilyn Fowler is a 38 year old WF who presented to the [facility] requesting admission for her increasing symptoms of depression of suicidal ideation without intent or plans. She also reorted lethargy, poor hygiene, inability to get out of bed, crying 2/7 days increased anxiety and increased appetite. She was isolating from her friends due to increased irritability and moodswings. She was advised by her therapist to come to the ED.        She was accepted for her second admission to [facility] and upon arrival to the unit she was evaluated by a psychiatrist.  Her symptoms were documented and medication management was initiated with Lamictal. Her Latuda was discontinued. She was given trazodone for sleep, but changed to Ambien at her request.  Welbutrin XL 75 mg was discontinued, as well as the Effexor. Her Welbutrin was titrated upward to 150mg  at the time of discharge.  She was also started on Buspar and titrated upward to 30mg  po BID for anxiety.        Her hypertension was treated with Norvasc 2.5mg  as she had been on it in the past. Her migraine headache was treated with Imitrex.  Marilyn Fowler was encouraged to participate in unit programming by attending the groups on the unit.  SHe was evaluated each day to ascertain her response to treatment and medication.       Improvement was noted by patient reports of decreasing symptoms, improved mood, sleep, appetite, and affect.  Her behavior was appropriate while on the unit and she did not require seclusion or restraint.      By the day of discharge she was in much improved condition and reported a significant reduction in symptoms. Marilyn Fowler reported that [facility] had been [facility] to her and she was motivated to follow up as noted below.  She denied SI/HI or AVH. She was in full contact with reality and discharged to her home.  Consults:  None  Significant  Diagnostic Studies:  None  Discharge Vitals:   Blood pressure 117/83, pulse 102, temperature 98 F (36.7 C), temperature source Oral, resp. rate 16, height 5\' 1"  (1.549 m), weight 113.399 kg (250 lb), last menstrual period 07/12/2013. Body mass index is 47.26 kg/(m^2). Lab Results:   No results found for this or any previous visit (from the past 72 hour(s)).  Physical Findings: AIMS: Facial and Oral Movements Muscles of Facial Expression: None, normal Lips and Perioral Area: None, normal Jaw: None, normal Tongue: None, normal,Extremity Movements Upper (arms, wrists, hands, fingers): None, normal Lower (legs, knees, ankles, toes): None, normal, Trunk Movements Neck, shoulders, hips: None, normal, Overall Severity Severity of abnormal movements (highest score from questions above): None, normal Incapacitation due to abnormal movements: None, normal Patient's awareness of abnormal movements (rate only patient's report): No Awareness, Dental Status Current problems with teeth and/or dentures?: No Does patient usually wear dentures?: No  CIWA:  CIWA-Ar Total: 2 COWS:  COWS Total Score: 3  Psychiatric Specialty Exam: See Psychiatric Specialty Exam and Suicide Risk Assessment completed by Attending Physician prior to discharge.  Discharge destination:  Home  Is patient on multiple antipsychotic therapies at discharge:  No   Has Patient had three or more failed trials of antipsychotic monotherapy by history:  No  Recommended Plan for Multiple Antipsychotic Therapies: NA  Discharge Orders   Future Appointments Provider Department Dept Phone   08/09/2013 3:30 PM Sheliah HatchKatherine E Tabori, MD [facility]  [place] [phone]   Future Orders Complete By Expires   Diet - low sodium heart healthy  As directed    Discharge instructions  As directed    Comments:     Take all of your medications as directed. Be sure to keep all of your follow up appointments.  If you are  unable to keep your follow up appointment, call your Doctor's office to let them know, and reschedule.  Make sure that you have enough medication to last until your appointment. Be sure to get plenty of rest. Going to bed at the same time each night will help. Try to avoid sleeping during the day.  Increase your activity as tolerated. Regular exercise will help you to sleep better and improve your mental health. Eating a heart healthy diet is recommended. Try to avoid salty or fried foods. Be sure to avoid all alcohol and illegal drugs.   Increase activity slowly  As directed        Medication List    STOP taking these medications       buPROPion 75 MG tablet  Commonly known as:  WELLBUTRIN  Replaced by:  buPROPion 150 MG 24 hr tablet     LATUDA  60 MG Tabs  Generic drug:  Lurasidone HCl      TAKE these medications     Indication   amLODipine 2.5 MG tablet  Commonly known as:  NORVASC  Take 1 tablet (2.5 mg total) by mouth daily. For hypertension.   Indication:  High Blood Pressure     buPROPion 150 MG 24 hr tablet  Commonly known as:  WELLBUTRIN XL  Take 1 tablet (150 mg total) by mouth daily. For depression.   Indication:  Major Depressive Disorder     busPIRone 30 MG tablet  Commonly known as:  BUSPAR  Take 1 tablet (30 mg total) by mouth 2 (two) times daily. For anxiety   Indication:  Symptoms of Feeling Anxious     clonazePAM 0.5 MG tablet  Commonly known as:  KLONOPIN  Take 1 tablet (0.5 mg total) by mouth 3 (three) times daily as needed for anxiety.   Indication:  Panic Disorder     zolpidem 10 MG tablet  Commonly known as:  AMBIEN  Take 1 tablet (10 mg total) by mouth at bedtime as needed for sleep.   Indication:  Trouble Sleeping           Follow-up Information   Follow up with [name] On 08/11/2013. (Wednesday, August 11, 2013 at 2:40 PM)    Contact information:   [address] [place], [place]   [address]  [phone]       Follow-up recommendations:   Activities: Resume activity as tolerated. Diet: Heart healthy low sodium diet Tests: Follow up testing will be determined by your out patient provider. Comments:    Total Discharge Time:  Less than 30 minutes.  Signed: MASHBURN,NEIL 07/30/2013, 1:19 PM  Patient was seen face-to-face for psychiatric evaluation, suicide risk assessment and discussed with her treatment team on physician extender. Formulated at appropriate disposition plan and reviewed the information documented and agree with the treatment plan.   Randal Buba Oaklee Esther 08/04/2013 1:13 PM

## 2013-07-30 NOTE — [facility] Group Notes (Signed)
[facility] L[facility] Dis[facility] Planning [facility] Note   07/30/2013 8:45 AM  Participation Quality:  Alert, Appropriate and Oriented  Mood/Affect:  Flat and Depressed  Depression Rating:  1  Anxiety Rating:  2  Thoughts of Suicide:  Pt denies SI/HI  Will you contract for safety?   Yes  Current AVH:  Pt denies  Plan for Discharge/Comments:  Pt attended discharge planning group and actively participated in group.  CSW provided pt with today's workbook.  [place] and has [facility].  Pt is anxious to d/c asap to get home to help her mom who has a broken leg.  No further needs voiced by pt at this time.    Transportation Means: Pt reports access to transportation - has own car here  Supports: No supports mentioned at this time  Reyes IvanChelsea Horton, LCSW 07/30/2013 10:16 AM

## 2013-07-30 NOTE — [facility] Suicide Risk Assessment (Signed)
   Demographic Factors:  Adolescent or young adult, Caucasian and Low socioeconomic status  Total Time spent with patient: 30 minutes  Psychiatric Specialty Exam: Physical Exam  ROS  Blood pressure 117/83, pulse 102, temperature 98 F (36.7 C), temperature source Oral, resp. rate 16, height 5\' 1"  (1.549 m), weight 113.399 kg (250 lb), last menstrual period 07/12/2013.Body mass index is 47.26 kg/(m^2).  General Appearance: Casual  Eye Contact::  Good  Speech:  Clear and Coherent  Volume:  Normal  Mood:  Euthymic  Affect:  Appropriate and Congruent  Thought Process:  Coherent and Goal Directed  Orientation:  Full (Time, Place, and Person)  Thought Content:  WDL  Suicidal Thoughts:  No  Homicidal Thoughts:  No  Memory:  Immediate;   Fair  Judgement:  Good  Insight:  Good  Psychomotor Activity:  Normal  Concentration:  Good  Recall:  Good  Fund of Knowledge:Good  Language: Good  Akathisia:  NA  Handed:  Right  AIMS (if indicated):     Assets:  Communication Skills Desire for Improvement Financial Resources/Insurance Housing Intimacy Leisure Time Physical Health Resilience Social Support Talents/Skills Transportation  Sleep:  Number of Hours: 3.25    Musculoskeletal: Strength & Muscle Tone: within normal limits Gait & Station: normal Patient leans: N/A   Mental Status Per Nursing Assessment::   On Admission:  Suicidal ideation indicated by patient  Current Mental Status by Physician: NA  Loss Factors: NA  Historical Factors: Family history of mental illness or substance abuse and Impulsivity  Risk Reduction Factors:   Sense of responsibility to family, Religious beliefs about death, Living with another person, especially a relative, Positive social support, Positive therapeutic relationship and Positive coping skills or problem solving skills  Continued Clinical Symptoms:  Bipolar Disorder:   Depressive phase Depression:   Recent sense of  peace/wellbeing Previous Psychiatric Diagnoses and Treatments  Cognitive Features That Contribute To Risk:  Polarized thinking    Suicide Risk:  Minimal: No identifiable suicidal ideation.  Patients presenting with no risk factors but with morbid ruminations; may be classified as minimal risk based on the severity of the depressive symptoms  Discharge Diagnoses:   AXIS I:  Bipolar, Depressed AXIS II:  Deferred AXIS III:   Past Medical History  Diagnosis Date  . Hypertension     gestational  . Hemorrhoid   . Migraine   . Anemia   . Depression   . Anxiety   . Blood transfusion without reported diagnosis    AXIS IV:  other psychosocial or environmental problems, problems related to social environment and problems with primary support group AXIS V:  61-70 mild symptoms  Plan Of Care/Follow-up recommendations:  Activity:  As tolerated Diet:  Regular  Is patient on multiple antipsychotic therapies at discharge:  No   Has Patient had three or more failed trials of antipsychotic monotherapy by history:  No  Recommended Plan for Multiple Antipsychotic Therapies: NA    Marilyn Fowler,Marilyn R. 07/30/2013, 11:34 AM

## 2013-07-30 NOTE — [facility] Group Notes (Signed)
[facility] LCSW Group Therapy  07/30/2013  1:15 PM   Type of Therapy:  Group Therapy  Participation Level:  Active  Participation Quality:  Attentive, Sharing and Supportive  Affect:  Calm  Cognitive:  Alert and Oriented  Insight:  Developing/Improving and Engaged  Engagement in Therapy:  Developing/Improving and Engaged  Modes of Intervention:  Clarification, Confrontation, Discussion, Education, Exploration, Limit-setting, Orientation, Problem-solving, Rapport Building, Dance movement psychotherapisteality Testing, Socialization and Support  Summary of Progress/Problems: The topic for today was feelings about relapse.  Pt discussed what relapse prevention is to them and identified triggers that they are on the path to relapse.  Pt processed their feeling towards relapse and was able to relate to peers.  Pt discussed coping skills that can be used for relapse prevention.  Pt did not share personally but actively listened to group discussion.    Marilyn IvanChelsea Horton, LCSW 07/30/2013  3:20 PM

## 2013-07-30 NOTE — Progress Notes (Signed)
Discharge Note: Discharge instructions/prescriptions/medication samples given to patient. Patient verbalized understanding of discharge instructions and prescriptions. Returned belongings to patient. Denies SI/HI/AVH. Patient d/c without incident to the lobby and will be transporting self home; car and keys are here at the hospital.

## 2013-07-30 NOTE — Progress Notes (Signed)
[facility] Adult [facility] Management Discharge Plan :  Will you be returning to the same living situation after discharge: Yes,  returning home At discharge, do you have transportation home?:Yes,  has own car here Do you have the ability to pay for your medications:Yes,  provided pt with prescriptions and pt verbalizes ability to afford meds  Release of information consent forms completed and in the chart;  Patient's signature needed at discharge.  Patient to Follow up at: Follow-up Information   Follow up with Anne [facility] On 08/11/2013. (Wednesday, August 11, 2013 at 2:40 PM)    Contact information:   [address] [place], [place]   [id number]  [phone]      Patient denies SI/HI:   Yes,  denies SI/HI    Safety Planning and Suicide Prevention discussed:  Yes,  discussed with pt and pt's mother.  See suicide prevention education note.  Carmina MillerHorton, Zemirah Krasinski Nicole 07/30/2013, 10:43 AM

## 2013-08-04 NOTE — Progress Notes (Addendum)
Patient Discharge Instructions:  After Visit Summary (AVS):   Faxed to:  08/06/13 Discharge Summary Note:   Faxed to:  08/06/13 Psychiatric Admission Assessment Note:   Faxed to:  08/06/13 Suicide Risk Assessment - Discharge Assessment:   Faxed to:  08/06/13 Faxed/Sent to the Next Level Care provider:  08/06/13 Faxed to [facility] @ [phone] No documentation was sent to [facility] on 08/04/13 for HBIPS.  No ROI was available.  Jerelene ReddenSheena E [place], 08/04/2013, 3:26 PM

## 2013-08-06 ENCOUNTER — Telehealth: Payer: Self-pay

## 2013-08-06 NOTE — Telephone Encounter (Signed)
Left message for call back Non identifiable  

## 2013-08-09 ENCOUNTER — Encounter: Payer: Self-pay | Admitting: Family Medicine

## 2013-08-09 ENCOUNTER — Ambulatory Visit (INDEPENDENT_AMBULATORY_CARE_PROVIDER_SITE_OTHER): Payer: Managed Care, Other (non HMO) | Admitting: Family Medicine

## 2013-08-09 VITALS — BP 140/100 | HR 103 | Temp 98.2°F | Resp 16 | Ht 61.25 in | Wt 252.1 lb

## 2013-08-09 DIAGNOSIS — G43909 Migraine, unspecified, not intractable, without status migrainosus: Secondary | ICD-10-CM

## 2013-08-09 DIAGNOSIS — I10 Essential (primary) hypertension: Secondary | ICD-10-CM

## 2013-08-09 MED ORDER — TOPIRAMATE 50 MG PO TABS: 50.0000 mg | ORAL_TABLET | Freq: Every day | ORAL | Status: DC

## 2013-08-09 MED ORDER — HYDROCHLOROTHIAZIDE 12.5 MG PO TABS: 12.5000 mg | ORAL_TABLET | Freq: Every day | ORAL | Status: DC

## 2013-08-09 MED ORDER — METOPROLOL SUCCINATE ER 25 MG PO TB24: 25.0000 mg | ORAL_TABLET | Freq: Every day | ORAL | Status: DC

## 2013-08-09 NOTE — Telephone Encounter (Signed)
Unable to reach patient pre visit.  

## 2013-08-09 NOTE — Progress Notes (Signed)
Pre visit review using our clinic review tool, if applicable. No additional management support is needed unless otherwise documented below in the visit note. 

## 2013-08-09 NOTE — Assessment & Plan Note (Signed)
Deteriorated since stopping Topamax.  Restart.  Titrate prn.  Start metoprolol daily for both BP control and migraine prophylaxis.  Will follow closely.

## 2013-08-09 NOTE — Progress Notes (Signed)
   Subjective:    Patient ID: Marilyn Fowler, female    DOB: [birth year], 38 y.o.   MRN: [id number]  HPI HTN- chronic problem, deteriorated recently.  Ended up in ER w/ elevated BP and HA 2 weeks ago.  Just recently started on Amlodipine 2.5mg .  Has hx of elevated BP during pregnancy.  BP was also elevated last summer and was on Norvasc, Hydralazine, and propranolol.  + edema of feet bilaterally.  Migraine- chronic problem, again having daily HAs.  + photophobia, no phonophobia.  + aura.  + nausea.  Previously on Topamax.  Asked psych to refill med and he refused.  Was previously taking 100mg  QHS.   Review of Systems For ROS see HPI     Objective:   Physical Exam  Vitals reviewed. Constitutional: She is oriented to person, place, and time. She appears well-developed and well-nourished. No distress.  HENT:  Head: Normocephalic and atraumatic.  Eyes: Conjunctivae and EOM are normal. Pupils are equal, round, and reactive to light.  Neck: Normal range of motion. Neck supple. No thyromegaly present.  Cardiovascular: Normal rate, regular rhythm, normal heart sounds and intact distal pulses.   No murmur heard. Pulmonary/Chest: Effort normal and breath sounds normal. No respiratory distress.  Abdominal: Soft. She exhibits no distension. There is no tenderness.  Musculoskeletal: She exhibits no edema.  Lymphadenopathy:    She has no cervical adenopathy.  Neurological: She is alert and oriented to person, place, and time.  Skin: Skin is warm and dry.  Psychiatric: She has a normal mood and affect. Her behavior is normal.          Assessment & Plan:

## 2013-08-09 NOTE — Assessment & Plan Note (Signed)
Deteriorated.  Pt went to ER 2 weeks ago for elevated BP and HA.  Low dose Norvasc is not controlling sxs and is causing swelling.  STOP norvasc.  Start Metoprolol for control of BP, HR and migraines.  Start HCTZ daily to control edema.  Check BMP.  Reviewed supportive care and red flags that should prompt return.  Pt expressed understanding and is in agreement w/ plan.

## 2013-08-09 NOTE — Patient Instructions (Signed)
Follow up in 3-4 weeks to recheck BP Start the Metoprolol daily Start the HCTZ daily (in the AM) Start the Topamax nightly for the headaches We'll notify you of your lab results and make any changes if needed Hang in there!!!

## 2013-08-10 ENCOUNTER — Telehealth: Payer: Self-pay | Admitting: Family Medicine

## 2013-08-10 LAB — CBC WITH DIFFERENTIAL/PLATELET
BASOS PCT: 0.3 % (ref 0.0–3.0)
Basophils Absolute: 0 10*3/uL (ref 0.0–0.1)
EOS ABS: 0.1 10*3/uL (ref 0.0–0.7)
Eosinophils Relative: 0.8 % (ref 0.0–5.0)
HCT: 36.6 % (ref 36.0–46.0)
Hemoglobin: 12.3 g/dL (ref 12.0–15.0)
LYMPHS PCT: 46.4 % — AB (ref 12.0–46.0)
Lymphs Abs: 3.5 10*3/uL (ref 0.7–4.0)
MCHC: 33.6 g/dL (ref 30.0–36.0)
MCV: 83.9 fl (ref 78.0–100.0)
Monocytes Absolute: 0.7 10*3/uL (ref 0.1–1.0)
Monocytes Relative: 10 % (ref 3.0–12.0)
NEUTROS PCT: 42.5 % — AB (ref 43.0–77.0)
Neutro Abs: 3.2 10*3/uL (ref 1.4–7.7)
Platelets: 302 10*3/uL (ref 150.0–400.0)
RBC: 4.36 Mil/uL (ref 3.87–5.11)
RDW: 14.2 % (ref 11.5–14.6)
WBC: 7.4 10*3/uL (ref 4.5–10.5)

## 2013-08-10 LAB — BASIC METABOLIC PANEL
BUN: 8 mg/dL (ref 6–23)
CO2: 24 meq/L (ref 19–32)
Calcium: 9.1 mg/dL (ref 8.4–10.5)
Chloride: 107 mEq/L (ref 96–112)
Creatinine, Ser: 0.9 mg/dL (ref 0.4–1.2)
GFR: 77.89 mL/min (ref >60.00)
GLUCOSE: 84 mg/dL (ref 70–99)
Potassium: 3.8 mEq/L (ref 3.5–5.1)
Sodium: 137 mEq/L (ref 135–145)

## 2013-08-10 LAB — HEPATIC FUNCTION PANEL
ALT: 30 U/L (ref 0–35)
AST: 26 U/L (ref 0–37)
Albumin: 4 g/dL (ref 3.5–5.2)
Alkaline Phosphatase: 71 U/L (ref 39–117)
BILIRUBIN DIRECT: 0.1 mg/dL (ref 0.0–0.3)
TOTAL PROTEIN: 7.1 g/dL (ref 6.0–8.3)
Total Bilirubin: 0.5 mg/dL (ref 0.3–1.2)

## 2013-08-10 LAB — TSH: TSH: 1.52 u[IU]/mL (ref 0.35–5.50)

## 2013-08-10 NOTE — Telephone Encounter (Signed)
Relevant patient education assigned to patient using Emmi. ° °

## 2013-09-08 ENCOUNTER — Ambulatory Visit: Payer: Self-pay | Admitting: Family Medicine

## 2013-09-17 ENCOUNTER — Encounter: Payer: Self-pay | Admitting: Family Medicine

## 2013-09-17 ENCOUNTER — Ambulatory Visit (INDEPENDENT_AMBULATORY_CARE_PROVIDER_SITE_OTHER): Payer: Managed Care, Other (non HMO) | Admitting: Family Medicine

## 2013-09-17 ENCOUNTER — Telehealth: Payer: Self-pay

## 2013-09-17 ENCOUNTER — Telehealth: Payer: Self-pay | Admitting: Family Medicine

## 2013-09-17 VITALS — BP 112/82 | HR 95 | Temp 98.2°F | Resp 17 | Wt 245.0 lb

## 2013-09-17 DIAGNOSIS — R079 Chest pain, unspecified: Secondary | ICD-10-CM

## 2013-09-17 DIAGNOSIS — R0781 Pleurodynia: Secondary | ICD-10-CM

## 2013-09-17 DIAGNOSIS — R071 Chest pain on breathing: Secondary | ICD-10-CM

## 2013-09-17 LAB — CBC WITH DIFFERENTIAL/PLATELET
BASOS ABS: 0 10*3/uL (ref 0.0–0.1)
BASOS PCT: 0 % (ref 0–1)
EOS ABS: 0.1 10*3/uL (ref 0.0–0.7)
EOS PCT: 1 % (ref 0–5)
HCT: 37.1 % (ref 36.0–46.0)
Hemoglobin: 13.2 g/dL (ref 12.0–15.0)
Lymphocytes Relative: 30 % (ref 12–46)
Lymphs Abs: 2.3 10*3/uL (ref 0.7–4.0)
MCH: 27.9 pg (ref 26.0–34.0)
MCHC: 35.6 g/dL (ref 30.0–36.0)
MCV: 78.4 fL (ref 78.0–100.0)
Monocytes Absolute: 0.6 10*3/uL (ref 0.1–1.0)
Monocytes Relative: 8 % (ref 3–12)
NEUTROS PCT: 61 % (ref 43–77)
Neutro Abs: 4.6 10*3/uL (ref 1.7–7.7)
PLATELETS: 311 10*3/uL (ref 150–400)
RBC: 4.73 MIL/uL (ref 3.87–5.11)
RDW: 15.1 % (ref 11.5–15.5)
WBC: 7.6 10*3/uL (ref 4.0–10.5)

## 2013-09-17 LAB — TROPONIN I: Troponin I: <0.01 ng/mL (ref <0.06)

## 2013-09-17 LAB — CK TOTAL AND CKMB (NOT AT ARMC)
CK TOTAL: 41 U/L (ref 7–177)
CK, MB: 0.1 ng/mL (ref 0.0–5.0)
Relative Index: 0.2 (ref 0.0–4.0)

## 2013-09-17 LAB — D-DIMER, QUANTITATIVE: D-Dimer, Quant: 0.27 ug/mL-FEU (ref 0.00–0.48)

## 2013-09-17 MED ORDER — NAPROXEN 500 MG PO TABS: 500.0000 mg | ORAL_TABLET | Freq: Two times a day (BID) | ORAL | Status: DC

## 2013-09-17 NOTE — Assessment & Plan Note (Signed)
New.  No changes on EKG.  No TTP over chest wall.  Will get labs to r/o cardiac issue and possible PE.  Reviewed supportive care and red flags that should prompt return.  Pt expressed understanding and is in agreement w/ plan.

## 2013-09-17 NOTE — Telephone Encounter (Signed)
Prior to OV today, patient called in to schedule appt with PCP. Scheduler ask for advice for patient, answered call. Patient states that she had pain on her left side of chest that felt like sand paper rubbing together. Patient was advised to come in to be seen. She denies any type of chest tightness, SOB, back pain or any other type of symptoms. Advised scheduler to put on at 945a. Patient arrived at 949a and the patient was not on the schedule. Miscommunication and lack of documentation was noted by provider. Verbal triage was give to PCP. Patient was seen.

## 2013-09-17 NOTE — Progress Notes (Signed)
Pre visit review using our clinic review tool, if applicable. No additional management support is needed unless otherwise documented below in the visit note. 

## 2013-09-17 NOTE — Telephone Encounter (Signed)
5.22.15  Pt presented herself at the registration desk at 9:42 am.  Pt stated she had an appt for today.  I looked the pt up and informed her she has an appt scheduled for May 27 for a follow up with Dr. Beverely Low.  Pt stated that she had called this morning and spoke to a man and a nurse and was advised to come in for a 9:45 appt for chest pains.  I immediately scheduled the pt and checked her in.  I advised the pt to check with Dr. Beverely Low and Shanda Bumps in regards to the appt scheduled for May 27 because that appt is for a separate issue regarding a f/u on BP and pt stated she would like to take care of that today if possible.  I told pt she may still need to keep that f/u appt and to please cask Dr. Beverely Low and Shanda Bumps.  Per Lennox Pippins, I am submitting this documentation as to why the pt was put on the schedule this morning for today's visit.  jsl

## 2013-09-17 NOTE — Progress Notes (Signed)
   Subjective:    Patient ID: Marilyn Fowler, female    DOB: 27-Sep-1975, 38 y.o.   MRN: [id number]  HPI L sided CP- sxs started 5 days ago.  Pain is intermittent but is frequent.  More painful w/ deep breath.  + cough- 'deep cough'.  No fevers.  Not on OCPs.  Not a smoker.  + sick contacts.  Chest is not painful to touch.  No nausea.  + SOB- occurs even at rest (which is new).  Pt has hx of pleurisy and reports this is similar.  No recent periods of immobility.  No personal hx of DVTs, + family hx of DVT.   Review of Systems For ROS see HPI     Objective:   Physical Exam  Vitals reviewed. Constitutional: She is oriented to person, place, and time. She appears well-developed and well-nourished. No distress.  HENT:  Head: Normocephalic and atraumatic.  Eyes: Conjunctivae and EOM are normal. Pupils are equal, round, and reactive to light.  Neck: Normal range of motion. Neck supple. No thyromegaly present.  Cardiovascular: Normal rate, regular rhythm, normal heart sounds and intact distal pulses.   No murmur heard. Pulmonary/Chest: Effort normal and breath sounds normal. No respiratory distress. She has no wheezes. She exhibits no tenderness.  Abdominal: Soft. She exhibits no distension. There is no tenderness.  Musculoskeletal: She exhibits no edema.  Lymphadenopathy:    She has no cervical adenopathy.  Neurological: She is alert and oriented to person, place, and time.  Skin: Skin is warm and dry.  Psychiatric: She has a normal mood and affect. Her behavior is normal.          Assessment & Plan:

## 2013-09-17 NOTE — Patient Instructions (Signed)
Follow up as needed We'll notify you of your lab results and make any changes if needed If you D Dimer is + you will need to go to the ER for a CT scan of your chest to r/o a blood clot in the lungs Start the Naproxen twice daily for possible pleurisy Call with any questions or concerns Hang in there!!

## 2013-09-17 NOTE — Assessment & Plan Note (Signed)
New.  No changes on EKG.  No TTP over chest wall.  Will get labs to r/o cardiac issue and possible PE.  Reviewed supportive care and red flags that should prompt return.  Pt expressed understanding and is in agreement w/ plan.  

## 2013-09-21 ENCOUNTER — Telehealth: Payer: Self-pay | Admitting: *Deleted

## 2013-09-21 DIAGNOSIS — R071 Chest pain on breathing: Secondary | ICD-10-CM

## 2013-09-21 DIAGNOSIS — R079 Chest pain, unspecified: Secondary | ICD-10-CM

## 2013-09-21 NOTE — Telephone Encounter (Signed)
Spoke with patient who stated that she is still having the pleuritic pain--left sided chest pain under ribs, arm, and breast--worsens when she takes a deep breath.  Still SOB.  She last saw Dr. Beverely Low for this issue on 09/17/13.  She denies new symptoms.  States that symptoms are the same as they were when she last saw Dr. Beverely Low and have not gotten better.  She has tried taking naproxen, tylenol, and tramadol and nothing is helping.  She does not want to come in for an appointment as she states she does not have the money to come back in to see [place].    Please advise.

## 2013-09-21 NOTE — Telephone Encounter (Signed)
She would really need to come in because I did not see her.   We can order a cxr 2 view and make sure that is normal-- start there

## 2013-09-21 NOTE — Telephone Encounter (Signed)
Caller name:  Zil[place] Relation to pt:  self Call back number:  [phone] Pharmacy:  [facility]  Reason for call:   Pt called this morning.  She is still having bad chest pains on left side, especially when she takes a deep breath.  Still short of breath all the time.  States she can't tell where the naproxen is helping with the pain.  Also been taking Tylenol and Tramadol.  Not coughing up anything.  Please advise.  bw

## 2013-09-21 NOTE — Telephone Encounter (Signed)
CXR ordered.  Patient instructed to report to [facility] for chest x-ray.  Patient stated that she was unsure of whether or not she would be able to go today or not.  As she was currently working and would need to pick up her kids after work.  She was informed that if she went after 7pm to go through the ER entrance. She stated understanding and stated that she would try to go this evening.  If she was unable to make it this evening she would go on Thursday.  Patient was encouraged to go this evening if she could and that if she had any other questions or concerns to call back. She said "ok".  No further questions or concerns were voiced at this time.

## 2013-09-21 NOTE — Telephone Encounter (Signed)
Called to speak with patient regarding symptoms.  No answer.  Left message for call back.

## 2013-09-22 ENCOUNTER — Ambulatory Visit: Payer: Self-pay | Admitting: Family Medicine

## 2013-09-22 NOTE — Telephone Encounter (Signed)
Noted  

## 2013-09-23 ENCOUNTER — Encounter: Payer: Self-pay | Admitting: Family Medicine

## 2013-09-23 ENCOUNTER — Ambulatory Visit (HOSPITAL_BASED_OUTPATIENT_CLINIC_OR_DEPARTMENT_OTHER)
Admission: RE | Admit: 2013-09-23 | Discharge: 2013-09-23 | Disposition: A | Discharge status: Home / Self Care | Payer: Managed Care, Other (non HMO) | Source: Ambulatory Visit | Attending: Family Medicine | Admitting: Family Medicine

## 2013-09-23 ENCOUNTER — Ambulatory Visit (INDEPENDENT_AMBULATORY_CARE_PROVIDER_SITE_OTHER): Payer: Managed Care, Other (non HMO) | Admitting: Family Medicine

## 2013-09-23 ENCOUNTER — Encounter: Payer: Self-pay | Admitting: General Practice

## 2013-09-23 VITALS — BP 120/78 | HR 92 | Temp 98.2°F | Resp 16 | Wt 246.5 lb

## 2013-09-23 DIAGNOSIS — R0602 Shortness of breath: Secondary | ICD-10-CM | POA: Insufficient documentation

## 2013-09-23 DIAGNOSIS — Z87891 Personal history of nicotine dependence: Secondary | ICD-10-CM | POA: Insufficient documentation

## 2013-09-23 DIAGNOSIS — R071 Chest pain on breathing: Secondary | ICD-10-CM

## 2013-09-23 DIAGNOSIS — R0781 Pleurodynia: Secondary | ICD-10-CM

## 2013-09-23 DIAGNOSIS — R0789 Other chest pain: Secondary | ICD-10-CM | POA: Insufficient documentation

## 2013-09-23 DIAGNOSIS — R079 Chest pain, unspecified: Secondary | ICD-10-CM

## 2013-09-23 MED ORDER — METHYLPREDNISOLONE ACETATE 80 MG/ML IJ SUSP
80.0000 mg | Freq: Once | INTRAMUSCULAR | Status: AC
  Administered 2013-09-23: 80 mg via INTRAMUSCULAR

## 2013-09-23 MED ORDER — TRAMADOL HCL 50 MG PO TABS: 50.0000 mg | ORAL_TABLET | Freq: Three times a day (TID) | ORAL | Status: DC | PRN

## 2013-09-23 MED ORDER — PREDNISONE 10 MG PO TABS: ORAL_TABLET | ORAL | Status: DC

## 2013-09-23 NOTE — Patient Instructions (Signed)
Follow up as needed STOP the Naproxen START the prednisone taper tomorrow- take w/ food We'll notify you of your lab results and make any changes if needed Take the tramadol for severe pain Try and take slow, deep breaths Call with any questions or concerns Hang in there!!

## 2013-09-23 NOTE — Progress Notes (Signed)
   Subjective:    Patient ID: Marilyn Fowler, female    DOB: [birth year], 38 y.o.   MRN: [id number]  HPI Chest pain- pt was seen on 5/22 for similar sxs.  Had normal EKG, ddimer, and today had normal CXR.  Reports still painful to breathe on L side.  Taking Naproxen and tramadol w/o relief.  Not painful to touch.  Pain is lateral and radiates under L axilla.  Pt reports increased SOB due to pain.  Pt has hx of pleurisy and reports this feels similar.     Review of Systems For ROS see HPI     Objective:   Physical Exam  Vitals reviewed. Constitutional: She appears well-developed and well-nourished. No distress.  Cardiovascular: Normal rate, regular rhythm, normal heart sounds and intact distal pulses.   Pulmonary/Chest: Effort normal and breath sounds normal. No respiratory distress. She has no wheezes. She has no rales. She exhibits no tenderness.  Musculoskeletal: She exhibits no edema.  Psychiatric:  Flat affect          Assessment & Plan:

## 2013-09-23 NOTE — Progress Notes (Signed)
Pre visit review using our clinic review tool, if applicable. No additional management support is needed unless otherwise documented below in the visit note. 

## 2013-09-24 LAB — SEDIMENTATION RATE: Sed Rate: 17 mm/hr (ref 0–22)

## 2013-09-24 LAB — ANA: Anti Nuclear Antibody(ANA): NEGATIVE

## 2013-09-24 NOTE — Assessment & Plan Note (Signed)
Ongoing issue.  Ddimer and troponin were normal.  EKG was normal.  CXR negative.  Will stop scheduled NSAIDs and start prednisone.  Will get additional labs to assess for possible rheumatologic process.  Reviewed supportive care and red flags that should prompt return.  Pt expressed understanding and is in agreement w/ plan.

## 2013-09-29 ENCOUNTER — Ambulatory Visit: Payer: Self-pay | Admitting: Family Medicine

## 2013-11-09 ENCOUNTER — Encounter: Payer: Self-pay | Admitting: Family Medicine

## 2013-11-09 ENCOUNTER — Ambulatory Visit (INDEPENDENT_AMBULATORY_CARE_PROVIDER_SITE_OTHER): Payer: Managed Care, Other (non HMO) | Admitting: Family Medicine

## 2013-11-09 ENCOUNTER — Encounter: Payer: Self-pay | Admitting: General Practice

## 2013-11-09 VITALS — BP 124/80 | HR 70 | Temp 98.4°F | Resp 16 | Wt 248.5 lb

## 2013-11-09 DIAGNOSIS — K5289 Other specified noninfective gastroenteritis and colitis: Secondary | ICD-10-CM

## 2013-11-09 DIAGNOSIS — K529 Noninfective gastroenteritis and colitis, unspecified: Secondary | ICD-10-CM | POA: Insufficient documentation

## 2013-11-09 MED ORDER — PROMETHAZINE HCL 25 MG PO TABS: 25.0000 mg | ORAL_TABLET | Freq: Three times a day (TID) | ORAL | Status: DC | PRN

## 2013-11-09 NOTE — Patient Instructions (Signed)
Follow up as needed Use the phenergan as needed for nausea- will cause drowsiness Drink plenty of fluids- this is a MUST to avoid dehydration! You will eat when you are ready Immodium as needed to slow stools Call with any questions or concerns Hang in there!

## 2013-11-09 NOTE — Assessment & Plan Note (Signed)
New.  Pt's sxs consistent w/ viral illness.  PE benign- no evidence of dehydration.  Start phenergan prn.  Reviewed supportive care and red flags that should prompt return.  Pt expressed understanding and is in agreement w/ plan.

## 2013-11-09 NOTE — Progress Notes (Signed)
Pre visit review using our clinic review tool, if applicable. No additional management support is needed unless otherwise documented below in the visit note. 

## 2013-11-09 NOTE — Progress Notes (Signed)
   Subjective:    Patient ID: Marilyn Fowler, female    DOB: 30-Jul-1975, 38 y.o.   MRN: [id number]  Emesis  Associated symptoms include diarrhea.  Diarrhea  Associated symptoms include vomiting.   Gastroenteritis- sxs started Sunday.  No fever.  Started vomiting overnight w/ watery diarrhea.  Initially thought it was food poisoning but 'it's continued'.  No blood in stool.  Yesterday had 10-12 watery stools, 2 today.  Not eating or drinking.  + nausea.  Last vomited last night after attempting to eat soup.  + sick contacts.   Review of Systems  Gastrointestinal: Positive for vomiting and diarrhea.   For ROS see HPI     Objective:   Physical Exam  Constitutional: She is oriented to person, place, and time. She appears well-developed and well-nourished. No distress.  HENT:  Head: Normocephalic and atraumatic.  MMM  Neck: Neck supple.  Cardiovascular: Normal rate, regular rhythm and intact distal pulses.   Pulmonary/Chest: Effort normal and breath sounds normal. No respiratory distress. She has no wheezes. She has no rales.  Abdominal: Soft. She exhibits no distension. There is no tenderness. There is no rebound.  Hyperactive BS  Lymphadenopathy:    She has no cervical adenopathy.  Neurological: She is alert and oriented to person, place, and time.  Skin: Skin is warm and dry.          Assessment & Plan:

## 2013-11-16 ENCOUNTER — Encounter: Payer: Self-pay | Admitting: Nurse Practitioner

## 2013-11-16 ENCOUNTER — Ambulatory Visit (INDEPENDENT_AMBULATORY_CARE_PROVIDER_SITE_OTHER): Payer: Managed Care, Other (non HMO) | Admitting: Nurse Practitioner

## 2013-11-16 VITALS — BP 131/91 | HR 84 | Temp 97.6°F | Ht 60.0 in | Wt 253.0 lb

## 2013-11-16 DIAGNOSIS — T1490XA Injury, unspecified, initial encounter: Secondary | ICD-10-CM

## 2013-11-16 MED ORDER — METHOCARBAMOL 500 MG PO TABS: 500.0000 mg | ORAL_TABLET | Freq: Three times a day (TID) | ORAL | Status: DC | PRN

## 2013-11-16 NOTE — Progress Notes (Signed)
Pre visit review using our clinic review tool, if applicable. No additional management support is needed unless otherwise documented below in the visit note. 

## 2013-11-16 NOTE — Progress Notes (Signed)
   Subjective:    Patient ID: Marilyn Fowler, female    DOB: 1975-05-25, 38 y.o.   MRN: [id number]  Shoulder Pain  The pain is present in the right shoulder. This is a new problem. The current episode started in the past 7 days (3d). There has been a history of trauma (lifting box). The problem occurs constantly. The problem has been waxing and waning. The quality of the pain is described as aching. The pain is moderate. Pertinent negatives include no fever, inability to bear weight, joint locking, joint swelling, limited range of motion, numbness, stiffness or tingling. She has tried NSAIDS (tramadol) for the symptoms. The treatment provided no relief.      Review of Systems  Constitutional: Negative for fever, activity change and fatigue.  Respiratory: Negative for cough and shortness of breath.   Musculoskeletal: Positive for arthralgias (R shoulder pain). Negative for stiffness.  Neurological: Negative for tingling and numbness.       Objective:   Physical Exam  Vitals reviewed. Constitutional: She is oriented to person, place, and time. She appears well-developed and well-nourished. No distress.  HENT:  Head: Normocephalic and atraumatic.  Eyes: Conjunctivae are normal. Right eye exhibits no discharge. Left eye exhibits no discharge.  Cardiovascular: Normal rate.   Pulmonary/Chest: Effort normal. No respiratory distress.  Musculoskeletal: She exhibits tenderness. She exhibits no edema.  R trapezius tenderness. Painful posterior extension w/R arm, nml strength.  Neurological: She is alert and oriented to person, place, and time.  Skin: Skin is warm and dry.  Psychiatric: She has a normal mood and affect. Her behavior is normal. Thought content normal.          Assessment & Plan:  1. Muscle injury R trapezius - methocarbamol (ROBAXIN) 500 MG tablet; Take 1 tablet (500 mg total) by mouth every 8 (eight) hours as needed for muscle spasms.  Dispense: 15 tablet; Refill:  0  See pt instructions. Heat. NSAIDS. Daily ROM exercise. F/U PRN.

## 2013-11-16 NOTE — Patient Instructions (Signed)
You likely have trapezius strain. You may have pain for 10 days to 2 weeks , but it should get better daily. No heavy lifting for 7-10 days, but do range of motion exercises several times daily, as discussed. Use moist heat several times daily (sockw/rice). Continue to use obuprophen 400 mg 2-3 times daily with food. Use muscle relaxer as needed. It will make you drowsy and can interact with other medicines such as tramadol, ambien, phenergen, clonopin, & buspar, so do not plan to drive when you add muscle relaxer. Please let us know if not improving.

## 2014-02-24 ENCOUNTER — Ambulatory Visit: Payer: Self-pay | Admitting: Family Medicine

## 2014-06-16 ENCOUNTER — Ambulatory Visit: Payer: Self-pay | Admitting: Family Medicine

## 2014-07-16 ENCOUNTER — Encounter (HOSPITAL_COMMUNITY): Payer: Self-pay

## 2014-07-16 ENCOUNTER — Emergency Department (HOSPITAL_COMMUNITY)
Admission: EM | Admit: 2014-07-16 | Discharge: 2014-07-16 | Disposition: A | Discharge status: Home / Self Care | Payer: Medicaid Other | Attending: Emergency Medicine | Admitting: Emergency Medicine

## 2014-07-16 DIAGNOSIS — G43909 Migraine, unspecified, not intractable, without status migrainosus: Secondary | ICD-10-CM | POA: Insufficient documentation

## 2014-07-16 DIAGNOSIS — F419 Anxiety disorder, unspecified: Secondary | ICD-10-CM | POA: Diagnosis not present

## 2014-07-16 DIAGNOSIS — Z79899 Other long term (current) drug therapy: Secondary | ICD-10-CM | POA: Diagnosis not present

## 2014-07-16 DIAGNOSIS — Z8719 Personal history of other diseases of the digestive system: Secondary | ICD-10-CM | POA: Diagnosis not present

## 2014-07-16 DIAGNOSIS — F329 Major depressive disorder, single episode, unspecified: Secondary | ICD-10-CM | POA: Diagnosis not present

## 2014-07-16 DIAGNOSIS — Z862 Personal history of diseases of the blood and blood-forming organs and certain disorders involving the immune mechanism: Secondary | ICD-10-CM | POA: Insufficient documentation

## 2014-07-16 DIAGNOSIS — Z3202 Encounter for pregnancy test, result negative: Secondary | ICD-10-CM | POA: Insufficient documentation

## 2014-07-16 DIAGNOSIS — G43009 Migraine without aura, not intractable, without status migrainosus: Secondary | ICD-10-CM

## 2014-07-16 DIAGNOSIS — I10 Essential (primary) hypertension: Secondary | ICD-10-CM | POA: Diagnosis not present

## 2014-07-16 DIAGNOSIS — Z87891 Personal history of nicotine dependence: Secondary | ICD-10-CM | POA: Insufficient documentation

## 2014-07-16 LAB — POC URINE PREG, ED: Preg Test, Ur: NEGATIVE

## 2014-07-16 MED ORDER — SODIUM CHLORIDE 0.9 % IV BOLUS (SEPSIS)
1000.0000 mL | Freq: Once | INTRAVENOUS | Status: AC
  Administered 2014-07-16: 1000 mL via INTRAVENOUS

## 2014-07-16 MED ORDER — PROCHLORPERAZINE EDISYLATE 5 MG/ML IJ SOLN
10.0000 mg | Freq: Once | INTRAMUSCULAR | Status: AC
  Administered 2014-07-16: 10 mg via INTRAVENOUS
  Filled 2014-07-16: qty 2

## 2014-07-16 MED ORDER — KETOROLAC TROMETHAMINE 30 MG/ML IJ SOLN
30.0000 mg | Freq: Once | INTRAMUSCULAR | Status: AC
  Administered 2014-07-16: 30 mg via INTRAVENOUS
  Filled 2014-07-16: qty 1

## 2014-07-16 NOTE — ED Notes (Signed)
Pt reports migraine with light sensitivity, nausea, and dizziness for three days unrelieved by tylenol, aspirin, BC, and benadryl.

## 2014-07-16 NOTE — ED Provider Notes (Signed)
CSN: [id number]     Arrival date & time 07/16/14  0930 History   First MD Initiated Contact with Patient 07/16/14 [phone]     Chief Complaint  Patient presents with  . Migraine     (Consider location/radiation/quality/duration/timing/severity/associated sxs/prior Treatment) HPI Marilyn Fowler is a 39 year old female with past medical history of migraines who presents to the ER with a headache. Patient reports a throbbing sensation unilaterally on the left side of her head for the past 3 days. Patient describes associated nausea, lightheadedness, photophobia. Patient states her signs and symptoms are consistent with previous migraines she has had in the past. Patient states she only came to the emergency room today because she has been unable to control her headache after using Tylenol, aspirin, BC powders and Benadryl. Patient denies any new symptoms associated with her headache. Patient denies any numbness, weakness, blurred vision, chest pain, shortness of breath, diarrhea, dysuria.  Past Medical History  Diagnosis Date  . Hemorrhoid   . Migraine   . Anemia   . Depression   . Anxiety   . Blood transfusion without reported diagnosis   . Hypertension    Past Surgical History  Procedure Laterality Date  . Ventral hernia repair    . Hemorrhoid surgery      Oct 2011  . Tonsillectomy    . Hernia repair    . Hemorrhoid surgery     Family History  Problem Relation Age of Onset  . Osteoarthritis Mother   . Diabetes Mother   . Hypertension Mother   . Depression Mother   . Hypertension Father   . Diabetes Father   . Asthma Son   . Stroke Neg Hx   . Deep vein thrombosis Maternal Grandmother   . Deep vein thrombosis Maternal Grandfather    History  Substance Use Topics  . Smoking status: Former Smoker -- 10 years  . Smokeless tobacco: Never Used     Comment: smoked 1994-2012, up to 1 pp WEEK  . Alcohol Use: No     Comment: socially   OB History    No data available      Review of Systems  Constitutional: Negative for fever.  HENT: Negative for trouble swallowing.   Eyes: Positive for photophobia. Negative for visual disturbance.  Respiratory: Negative for shortness of breath.   Cardiovascular: Negative for chest pain.  Gastrointestinal: Positive for nausea. Negative for vomiting and abdominal pain.  Genitourinary: Negative for dysuria.  Musculoskeletal: Negative for neck pain.  Skin: Negative for rash.  Neurological: Positive for light-headedness and headaches. Negative for dizziness, weakness and numbness.  Psychiatric/Behavioral: Negative.       Allergies  Review of patient's allergies indicates no known allergies.  Home Medications   Prior to Admission medications   Medication Sig Start Date End Date Taking? Authorizing Provider  buPROPion (WELLBUTRIN XL) 300 MG 24 hr tablet Take 300 mg by mouth daily.   Yes Historical Provider, MD  clonazePAM (KLONOPIN) 0.5 MG tablet Take 1 tablet (0.5 mg total) by mouth 3 (three) times daily as needed for anxiety. 07/30/13  Yes Lloyd HugerNeil T Mashburn, PA-C  QUEtiapine (SEROQUEL) 400 MG tablet Take 400 mg by mouth at bedtime.   Yes Historical Provider, MD  zolpidem (AMBIEN) 10 MG tablet Take 10 mg by mouth at bedtime as needed for sleep.  08/08/13  Yes Historical Provider, MD  amLODipine (NORVASC) 2.5 MG tablet Take 1 tablet (2.5 mg total) by mouth daily. For hypertension. Patient not taking: Reported on 07/16/2014  07/30/13   Tamala Julian, PA-C  buPROPion (WELLBUTRIN XL) 150 MG 24 hr tablet Take 1 tablet (150 mg total) by mouth daily. For depression. Patient not taking: Reported on 07/16/2014 07/30/13   Tamala Julian, PA-C  busPIRone (BUSPAR) 30 MG tablet Take 1 tablet (30 mg total) by mouth 2 (two) times daily. For anxiety Patient not taking: Reported on 07/16/2014 07/30/13   Tamala Julian, PA-C  hydrochlorothiazide (HYDRODIURIL) 12.5 MG tablet Take 1 tablet (12.5 mg total) by mouth daily. Patient not taking: Reported  on 07/16/2014 08/09/13   Sheliah Hatch, MD  methocarbamol (ROBAXIN) 500 MG tablet Take 1 tablet (500 mg total) by mouth every 8 (eight) hours as needed for muscle spasms. Patient not taking: Reported on 07/16/2014 11/16/13   Kelle Darting, NP  metoprolol succinate (TOPROL-XL) 25 MG 24 hr tablet Take 1 tablet (25 mg total) by mouth daily. Patient not taking: Reported on 07/16/2014 08/09/13   Sheliah Hatch, MD  topiramate (TOPAMAX) 50 MG tablet Take 1 tablet (50 mg total) by mouth daily. Patient not taking: Reported on 07/16/2014 08/09/13   Sheliah Hatch, MD  traMADol (ULTRAM) 50 MG tablet Take 1 tablet (50 mg total) by mouth every 8 (eight) hours as needed. Patient not taking: Reported on 07/16/2014 09/23/13   Sheliah Hatch, MD   BP 132/96 mmHg  Pulse 94  Temp(Src) 98.3 F (36.8 C) (Oral)  Resp 16  SpO2 98%  LMP 07/11/2014 Physical Exam  Constitutional: She is oriented to person, place, and time. She appears well-developed and well-nourished. No distress.  HENT:  Head: Normocephalic and atraumatic.  Mouth/Throat: Uvula is midline and oropharynx is clear and moist. No oropharyngeal exudate.  Eyes: Conjunctivae, EOM and lids are normal. Pupils are equal, round, and reactive to light. Right eye exhibits no discharge. Left eye exhibits no discharge. No scleral icterus.  Neck: Normal range of motion and full passive range of motion without pain. Neck supple. No spinous process tenderness and no muscular tenderness present. No rigidity. Normal range of motion present. No Brudzinski's sign and no Kernig's sign noted.  Cardiovascular: Normal rate, regular rhythm and normal heart sounds.   No murmur heard. Pulmonary/Chest: Effort normal and breath sounds normal. No respiratory distress.  Abdominal: Soft. There is no tenderness.  Musculoskeletal: Normal range of motion. She exhibits no edema or tenderness.  Neurological: She is alert and oriented to person, place, and time. She has  normal strength. No cranial nerve deficit or sensory deficit. She displays a negative Romberg sign. Coordination and gait normal. GCS eye subscore is 4. GCS verbal subscore is 5. GCS motor subscore is 6.  Patient fully alert, answering questions appropriately in full, clear sentences. Cranial nerves II through XII grossly intact. Motor strength 5 out of 5 in all major muscle groups of upper and lower extremity. Distal sensation intact.  Skin: Skin is warm and dry. No rash noted. She is not diaphoretic.  Psychiatric: She has a normal mood and affect.  Nursing note and vitals reviewed.   ED Course  Procedures (including critical care time) Labs Review Labs Reviewed  POC URINE PREG, ED    Imaging Review No results found.   EKG Interpretation None      MDM   Final diagnoses:  Nonintractable migraine, unspecified migraine type    Pt HA treated and improved while in ED.  Presentation is like pts typical HA and non concerning for [facility], ICH, Meningitis, or temporal arteritis. Pt is  afebrile with no focal neuro deficits, nuchal rigidity, or change in vision. Pt is to follow up with PCP to discuss prophylactic medication. Pt verbalizes understanding and is agreeable with plan to dc. Patient given resource guide to help her find primary care provider. Discussed return precautions with patient, patient verbalizes understanding and agreement of this plan. I encouraged patient to call or return to ER should she have any questions or concerns.  BP 132/96 mmHg  Pulse 94  Temp(Src) 98.3 F (36.8 C) (Oral)  Resp 16  SpO2 98%  LMP 07/11/2014  Signed,  Ladona Mow, PA-C 12:03 PM      Ladona Mow, PA-C 07/16/14 1203  Mancel Bale, MD 07/16/14 [phone]

## 2014-07-16 NOTE — ED Notes (Signed)
Per pt, migraine x 3 days.  Hx of same.  Not being treated.  Not able to get migraine to break.  Nausea no vomiting.  Light sensitivity.

## 2014-07-16 NOTE — Discharge Instructions (Signed)
Migraine Headache °A migraine headache is an intense, throbbing pain on one or both sides of your head. A migraine can last for 30 minutes to several hours. °CAUSES  °The exact cause of a migraine headache is not always known. However, a migraine may be caused when nerves in the brain become irritated and release chemicals that cause inflammation. This causes pain. °Certain things may also trigger migraines, such as: °· Alcohol. °· Smoking. °· Stress. °· Menstruation. °· Aged cheeses. °· Foods or drinks that contain nitrates, glutamate, aspartame, or tyramine. °· Lack of sleep. °· Chocolate. °· Caffeine. °· Hunger. °· Physical exertion. °· Fatigue. °· Medicines used to treat chest pain (nitroglycerine), birth control pills, estrogen, and some blood pressure medicines. °SIGNS AND SYMPTOMS °· Pain on one or both sides of your head. °· Pulsating or throbbing pain. °· Severe pain that prevents daily activities. °· Pain that is aggravated by any physical activity. °· Nausea, vomiting, or both. °· Dizziness. °· Pain with exposure to bright lights, loud noises, or activity. °· General sensitivity to bright lights, loud noises, or smells. °Before you get a migraine, you may get warning signs that a migraine is coming (aura). An aura may include: °· Seeing flashing lights. °· Seeing bright spots, halos, or zigzag lines. °· Having tunnel vision or blurred vision. °· Having feelings of numbness or tingling. °· Having trouble talking. °· Having muscle weakness. °DIAGNOSIS  °A migraine headache is often diagnosed based on: °· Symptoms. °· Physical exam. °· A CT scan or MRI of your head. These imaging tests cannot diagnose migraines, but they can help rule out other causes of headaches. °TREATMENT °Medicines may be given for pain and nausea. Medicines can also be given to help prevent recurrent migraines.  °HOME CARE INSTRUCTIONS °· Only take over-the-counter or prescription medicines for pain or discomfort as directed by your  health care provider. The use of long-term narcotics is not recommended. °· Lie down in a dark, quiet room when you have a migraine. °· Keep a journal to find out what may trigger your migraine headaches. For example, write down: °¨ What you eat and drink. °¨ How much sleep you get. °¨ Any change to your diet or medicines. °· Limit alcohol consumption. °· Quit smoking if you smoke. °· Get 7-9 hours of sleep, or as recommended by your health care provider. °· Limit stress. °· Keep lights dim if bright lights bother you and make your migraines worse. °SEEK IMMEDIATE MEDICAL CARE IF:  °· Your migraine becomes severe. °· You have a fever. °· You have a stiff neck. °· You have vision loss. °· You have muscular weakness or loss of muscle control. °· You start losing your balance or have trouble walking. °· You feel faint or pass out. °· You have severe symptoms that are different from your first symptoms. °MAKE SURE YOU:  °· Understand these instructions. °· Will watch your condition. °· Will get help right away if you are not doing well or get worse. °Document Released: 04/15/2005 Document Revised: 08/30/2013 Document Reviewed: 12/21/2012 °ExitCare® Patient Information ©2015 [employer]. This information is not intended to replace advice given to you by your health care provider. Make sure you discuss any questions you have with your health care provider. ° ° °Emergency Department Resource Guide °1) Find a Doctor and Pay Out of Pocket °Although you won't have to find out who is covered by your insurance plan, it is a good idea to ask around and get recommendations.   You will then need to call the office and see if the doctor you have chosen will accept you as a new patient and what types of options they offer for patients who are self-pay. Some doctors offer discounts or will set up payment plans for their patients who do not have insurance, but you will need to ask so you aren't surprised when you get to your  appointment. ° °2) Contact Your Local Health Department °Not all health departments have doctors that can see patients for sick visits, but many do, so it is worth a call to see if yours does. If you don't know where your local health department is, you can check in your phone book. The CDC also has a tool to help you locate your state's health department, and many state websites also have listings of all of their local health departments. ° °3) Find a Walk-in Clinic °If your illness is not likely to be very severe or complicated, you may want to try a walk in clinic. These are popping up all over the country in pharmacies, drugstores, and shopping centers. They're usually staffed by nurse practitioners or physician assistants that have been trained to treat common illnesses and complaints. They're usually fairly quick and inexpensive. However, if you have serious medical issues or chronic medical problems, these are probably not your best option. ° °No Primary Care Doctor: °- Call [facility] at  832-8000 - they can help you locate a primary care doctor that  accepts your insurance, provides certain services, etc. °- Physician Referral Service- 1-800-533-3463 ° °Chronic Pain Problems: °Organization         Address  Phone   Notes  °[facility]  (336) 297-2271 Patients need to be referred by their primary care doctor.  ° °Medication Assistance: °Organization         Address  Phone   Notes  °[place] [facility] [address] °[place], [place] [address] (336) 641-8030 --Must be a resident of [place] °-- Must have NO insurance coverage whatsoever (no Medicaid/ Medicare, etc.) °-- The pt. MUST have a primary care doctor that directs their care regularly and follows them in the community °  °[facility]  (866) 331-1348   °[employer]  (888) 892-1162   ° °Agencies that provide inexpensive medical care: °Organization         Address  Phone   Notes  °[facility]  (336) 832-8035   °[facility]    (336) 832-7272   °[facility] [address] °[place], [place] [address] (336) 832-4777   °[facility] [address], °[place] (336) 271-4999   °[facility]    (336) 373-0678   °[facility]    (336) 272-1050   °[facility] ° [address], [place] Phone:  (336) 832-4444, Fax:  (336) 832-4440 Hours of Operation:  9 am - 6 pm, M-F.  Also accepts Medicaid/Medicare and self-pay.  ° [facility] ° [address], [place] Phone: (336) 832-3150, Fax: (336) 832-3151. Hours of Operation:  8:30 am - 5:30 pm, M-F.  Also accepts Medicaid and self-pay.  °[facility] [place] [address], [place] Phone: (336) 878-6027   °[facility] [address], [place], [place] (336)723-1848, Ext. 123 Mondays & Thursdays: 7-9 AM.  First 15 patients are seen on a first come,   first serve basis. °  ° °Medicaid-accepting [place] Providers: ° °Organization         Address  Phone   Notes  °[facility] [address], [place] (336) 641-2100 Also accepts self-pay patients.  °[facility] [address], [place] ° (336) 856-9996   °[facility] [address] [place] [address], [place] (336) 288-8857   °[facility] [address] [place] [address], [place] (336) 299-7000   °Veita Bland [address], [place]  ° (336) 373-1557 Only accepts [place] Access Medicaid patients after they have their name applied to their card.  ° °Self-Pay (no insurance) in [place]: ° °Organization         Address  Phone   Notes  °Sickle Cell Patients, [facility] [address], [place] (336) 832-1970   °[facility] [address], [place] (336) 832-4400   °[facility]  [place] ° [address], [place] (336) 992-4800   °[facility]/Dr. Osei-Bonsu ° [address] [place] [address], [place] or [address], [place] (336) 841-8500 Phone number for both [place] and [place] locations is the same.  °[facility] [address], [place] (336) 299-0000   °[facility] [address] [place] [address], [place] or [address] (336) 852-7530 °(336) 878-2260   °[facility] [address], [place] (336) 350-1642, phone; (336) 294-5005, fax Sees patients 1st and 3rd Saturday of every month.  Must not qualify for public or private insurance (i.e. Medicaid, Medicare, [place] Health Choice, Veterans' Benefits) • Household income should be no more than 200% of the poverty level •The clinic cannot treat you if you are pregnant or think you are pregnant • Sexually transmitted diseases are not treated at the clinic.  ° ° °Dental Care: °Organization         Address  Phone  Notes  °[facility] [address], [place] (336) 641-6152 Accepts children up to age 21 who are enrolled in Medicaid or [place] Health Choice; pregnant women with a Medicaid card; and children who have applied for Medicaid or [place] Health Choice, but were declined, whose parents can pay a reduced fee at time of service.  °[facility] [place]  [address], [place] (336) 641-7733 Accepts children up to age 21 who are enrolled in Medicaid or [place] Health Choice; pregnant women with a Medicaid card; and children who have applied for Medicaid or [place] Health Choice, but were declined, whose parents can pay a reduced fee at time of service.  °[facility] ° [address], [place] (336) 641-4533 Patients are seen by appointment only. Walk-ins are not accepted. [facility] will see patients 18 years of age and  older. °Monday - Tuesday (8am-5pm) °Most Wednesdays (8:30-5pm) °$30 per visit, cash only  °[facility] ° [address], [place] (336) 641-4533 Patients are seen by appointment only. Walk-ins are not accepted. [facility] will see patients 18 years of age and older. °One Wednesday Evening (Monthly: Volunteer Based).  $30 per visit, cash only  °[facility]  (919) 537-3737 for adults; Children under age 4, call Graduate Pediatric Dentistry at (919) [phone]. Children aged 4-14, please   call (919) [phone] to request a pediatric application. ° Dental services are provided in all areas of dental care including fillings, crowns and bridges, complete and partial dentures, implants, gum treatment, root canals, and extractions. Preventive care is also provided. Treatment is provided to both adults and children. °Patients are selected via a lottery and there is often a waiting list. °  °[facility] [address], °[place] ° (336) 763-8833 [email or web address] °  °[facility] [address], [place], [place] (336)723-1848, Ext. 123 Second and Fourth Thursday of each month, opens at 6:30 AM; Clinic ends at 9 AM.  Patients are seen on a first-come first-served basis, and a limited number are seen during each clinic.  ° °[facility] ° [address], [place], [place] (336) 723-7904   Eligibility Requirements °You must have lived in [place], [place], or [place] counties for at least the last three months. °  You cannot be eligible for state or federal sponsored healthcare insurance, including Veterans Administration, Medicaid, or Medicare. °  You generally cannot be eligible for healthcare insurance through your employer.  °  How to apply: °Eligibility screenings are held every Tuesday and Wednesday afternoon from 1:00 pm until 4:00 pm. You do not need an appointment for the interview!  °[facility] [address],  [place], [place] 336-631-2330   °[facility]  336-342-8273   °[facility]  336-703-3100   °[facility]  336-570-6415   ° °Behavioral Health Resources in the Community: °Intensive Outpatient Programs °Organization         Address  Phone  Notes  °[facility] [address], [place], [place] 336-878-6098   °[facility] [address], [place], [place] 336-832-9800   °[facility] [address], [place], [place] ° 336-882-2125   °[facility] [address],  °[place], [place] 1-800-853-5163 or 336-641-4981   °Substance Abuse Resources °Organization         Address  Phone  Notes  °[facility]  336-882-2125   °[facility]  336-784-9470   °[facility]  336-285-9073   °[facility]  336-845-3988   °Residential & Outpatient Substance Abuse Program  1-800-659-3381   °Psychological Services °Organization         Address  Phone  Notes  °[facility]  336- 832-9600   °[facility]  336- 378-7881   °[facility] [address], [place] 1-800-853-5163 or 336-641-4981   ° °Mobile Crisis Teams °Organization         Address  Phone  Notes  °[facility], Mobile Crisis Care Unit  1-877-626-1772   °Assertive °[facility] ° [address] [place], [place] 336-834-9664   °Sharon DeEsch [address] °[place] 336-554-5454   ° °Self-Help/Support Groups °Organization         Address  Phone             Notes  °[facility] - variety of support groups  336- 373-1402 Call for more information  °Narcotics Anonymous (NA), [facility] [address], °[place]  2 meetings at this location  ° °Residential Treatment Programs °Organization         Address  Phone  Notes  °[facility] [address],    °[place]  1-866-801-8205   °[facility] ° [address], [place], [place] 704-293-8524   °[facility] [address], [place] 336-845-3988 Admissions: 8am-3pm M-F  °[facility] [address]    °[place], [place] 336-841-1104   °[facility] [address], [place], [place] 336-379-7146   °[facility] [address]  °[place], [place] 336-285-9073   °[facility] - Intensive Outpatient [address], [place], [place] 336-852-3033   °[facility] [address]  °[place], [place] 1-877-615-2722 or 336-784-9470   °[facility] [address] [place], [place] 336-227-7417 Accepts Medicaid  °[facility] [address]  °[place] 1-800-659-3381 Substance Abuse/Addiction Treatment  ° °[place] Behavioral Health Resources °Organization         Address  Phone  Notes  °[facility]  (888) 581-9988   °Julie Brannon, PhD [address] [place], [place]   (336) 349-5553 or (336) 951-0000   °[facility]   [address] °[place], [place] (336) 349-4454   °[facility] [address], [place], [place] (336) 342-8316 Insurance/Medicaid/sponsorship through [facility]  °[facility] [address]                                    [place], [place] (336) 342-8316 Therapy/tele-psych/case  °[facility] [address]  ° [place], [place] (336) 349-2233    °Dr. Arfeen  (336) 349-4544   °[facility] [place]  [employer] [place] [facility] 1) [address], [place] °2) [address], [place] °3)  [address], [place] (336) 349-3220 °(336) 342-7768 ° °(336) 342-8140   °[place] [facility] (336) 342-1394 or (336) 342-3537 (After Hours)    ° ° ° °

## 2014-09-14 ENCOUNTER — Ambulatory Visit (INDEPENDENT_AMBULATORY_CARE_PROVIDER_SITE_OTHER): Payer: Self-pay | Admitting: Family Medicine

## 2014-09-14 ENCOUNTER — Encounter: Payer: Self-pay | Admitting: Family Medicine

## 2014-09-14 VITALS — BP 140/90 | HR 104 | Temp 98.3°F | Resp 16 | Wt 265.0 lb

## 2014-09-14 DIAGNOSIS — J0100 Acute maxillary sinusitis, unspecified: Secondary | ICD-10-CM

## 2014-09-14 DIAGNOSIS — I10 Essential (primary) hypertension: Secondary | ICD-10-CM

## 2014-09-14 MED ORDER — SULFAMETHOXAZOLE-TRIMETHOPRIM 800-160 MG PO TABS: 1.0000 | ORAL_TABLET | Freq: Two times a day (BID) | ORAL | Status: DC

## 2014-09-14 MED ORDER — AMLODIPINE BESYLATE 2.5 MG PO TABS: 2.5000 mg | ORAL_TABLET | Freq: Every day | ORAL | Status: DC

## 2014-09-14 MED ORDER — METOPROLOL SUCCINATE ER 25 MG PO TB24: 25.0000 mg | ORAL_TABLET | Freq: Every day | ORAL | Status: DC

## 2014-09-14 NOTE — Progress Notes (Signed)
Pre visit review using our clinic review tool, if applicable. No additional management support is needed unless otherwise documented below in the visit note. 

## 2014-09-14 NOTE — Assessment & Plan Note (Signed)
Chronic problem.  Deteriorated since stopping meds.  Pt now has Medicaid and is not able to afford regular visits or lab work.  Will restart metoprolol and amlodipine as these don't require lab monitoring.  Instructed pt that if she cannot return to this office for BP f/u she should have her BP checked at psych.  Pt expressed understanding and is in agreement w/ plan.

## 2014-09-14 NOTE — Patient Instructions (Signed)
Schedule your complete physical as you are able Start the Bactrim twice daily for the sinus infection- take w/ food Drink plenty of fluids Restart the amlodipine and the Metoprolol daily for blood pressure Call with any questions or concerns Hang in there!!!

## 2014-09-14 NOTE — Progress Notes (Signed)
   Subjective:    Patient ID: Marilyn Fowler, female    Mordecai MaesB: [birth year], 39 y.o.   MRN: [id number]  HPI URI- sxs started Sunday w/ nasal congestion.  Monday developed 'throbbing HA'.  + sinus pain, pressure behind the eyes.  + low grade fevers.  + nausea.  No ear pain.  No cough.  + sick contacts.  Has been talking Amox for recent dental infxn w/o improvement in sxs.  HTN- chronic problem.  Not currently on BP meds.  Pt is currently a [employer] Access [place] pt and has not had recent labs.  No CP.  + SOB.   Review of Systems For ROS see HPI     Objective:   Physical Exam  Constitutional: She appears well-developed and well-nourished. No distress.  HENT:  Head: Normocephalic and atraumatic.  Right Ear: Tympanic membrane normal.  Left Ear: Tympanic membrane normal.  Nose: Mucosal edema and rhinorrhea present. Right sinus exhibits maxillary sinus tenderness and frontal sinus tenderness. Left sinus exhibits maxillary sinus tenderness and frontal sinus tenderness.  Mouth/Throat: Uvula is midline and mucous membranes are normal. Posterior oropharyngeal erythema present. No oropharyngeal exudate.  Eyes: Conjunctivae and EOM are normal. Pupils are equal, round, and reactive to light.  Neck: Normal range of motion. Neck supple.  Cardiovascular: Normal rate, regular rhythm and normal heart sounds.   Pulmonary/Chest: Effort normal and breath sounds normal. No respiratory distress. She has no wheezes.  Musculoskeletal: She exhibits no edema.  Lymphadenopathy:    She has no cervical adenopathy.  Vitals reviewed.         Assessment & Plan:

## 2014-09-14 NOTE — Assessment & Plan Note (Signed)
New.  Pt's sxs and PE consistent w/ infxn.  Start abx.  Reviewed supportive care and red flags that should prompt return.  Pt expressed understanding and is in agreement w/ plan.  

## 2014-09-19 ENCOUNTER — Telehealth: Payer: Self-pay | Admitting: Family Medicine

## 2014-09-19 NOTE — Telephone Encounter (Signed)
Please advise, Pt was seen on 09-14-14 (acute sinusitis), given Bactrim BID X 10 days.

## 2014-09-19 NOTE — Telephone Encounter (Signed)
Continue Bactrim to prevent abx resistance but add OTC claritin or Zyrtec daily.  Also add Mucinex DM for congestion and drainage.  If no better after completion of abx, let us know

## 2014-09-19 NOTE — Telephone Encounter (Signed)
Pt notified of provider directions and stated an understanding.

## 2014-09-19 NOTE — Telephone Encounter (Signed)
Caller name:Varady Verlon AuLeslie Relationship to patient: Can be reached:[phone] Pharmacy:[facility] [address]  Reason for call: still not better since last appointmen

## 2014-10-11 ENCOUNTER — Encounter: Payer: Self-pay | Admitting: Physician Assistant

## 2014-10-11 ENCOUNTER — Ambulatory Visit (INDEPENDENT_AMBULATORY_CARE_PROVIDER_SITE_OTHER): Payer: PRIVATE HEALTH INSURANCE | Admitting: Physician Assistant

## 2014-10-11 VITALS — BP 123/88 | HR 96 | Temp 98.8°F | Resp 18 | Ht 60.0 in | Wt 269.2 lb

## 2014-10-11 DIAGNOSIS — J019 Acute sinusitis, unspecified: Secondary | ICD-10-CM | POA: Diagnosis not present

## 2014-10-11 DIAGNOSIS — B9689 Other specified bacterial agents as the cause of diseases classified elsewhere: Secondary | ICD-10-CM

## 2014-10-11 MED ORDER — BENZONATATE 200 MG PO CAPS: 200.0000 mg | ORAL_CAPSULE | Freq: Two times a day (BID) | ORAL | Status: DC | PRN

## 2014-10-11 MED ORDER — AMOXICILLIN-POT CLAVULANATE 875-125 MG PO TABS: 1.0000 | ORAL_TABLET | Freq: Two times a day (BID) | ORAL | Status: DC

## 2014-10-11 NOTE — Progress Notes (Signed)
Pre visit review using our clinic review tool, if applicable. No additional management support is needed unless otherwise documented below in the visit note/SLS  

## 2014-10-11 NOTE — Assessment & Plan Note (Signed)
Rx Augmentin.  Increase fluids.  Rest.  Saline nasal spray.  Probiotic.  Mucinex as directed.  Humidifier in bedroom. Tessalon for cough.  Call or return to clinic if symptoms are not improving.  

## 2014-10-11 NOTE — Progress Notes (Signed)
Patient presents to clinic today c/o 5 days of worsening sinus pressure, sinus pain, dry cough.  Now having aches/chills and significant fatigue.  Is unsure of fever.  Denies chest pain, SOB, recent travel or sick contact.  Past Medical History  Diagnosis Date  . Hemorrhoid   . Migraine   . Anemia   . Depression   . Anxiety   . Blood transfusion without reported diagnosis   . Hypertension     Current Outpatient Prescriptions on File Prior to Visit  Medication Sig Dispense Refill  . amLODipine (NORVASC) 2.5 MG tablet Take 1 tablet (2.5 mg total) by mouth daily. For hypertension. 30 tablet 0  . buPROPion (WELLBUTRIN XL) 300 MG 24 hr tablet Take 300 mg by mouth daily.    . clonazePAM (KLONOPIN) 0.5 MG tablet Take 1 tablet (0.5 mg total) by mouth 3 (three) times daily as needed for anxiety. 30 tablet 0  . methocarbamol (ROBAXIN) 500 MG tablet Take 1 tablet (500 mg total) by mouth every 8 (eight) hours as needed for muscle spasms. 15 tablet 0  . metoprolol succinate (TOPROL-XL) 25 MG 24 hr tablet Take 1 tablet (25 mg total) by mouth daily. 90 tablet 3  . QUEtiapine (SEROQUEL) 400 MG tablet Take 400 mg by mouth at bedtime.    [place] Kitchen zolpidem (AMBIEN) 10 MG tablet Take 10 mg by mouth at bedtime as needed for sleep.      No current facility-administered medications on file prior to visit.    No Known Allergies  Family History  Problem Relation Age of Onset  . Osteoarthritis Mother   . Diabetes Mother   . Hypertension Mother   . Depression Mother   . Hypertension Father   . Diabetes Father   . Asthma Son   . Stroke Neg Hx   . Deep vein thrombosis Maternal Grandmother   . Deep vein thrombosis Maternal Grandfather     History   Social History  . Marital Status: Married    Spouse Name: N/A  . Number of Children: 2  . Years of Education: N/A   Occupational History  .     Social History Main Topics  . Smoking status: Former Smoker -- 10 years  . Smokeless tobacco: Never Used       Comment: smoked 1994-2012, up to 1 pp WEEK  . Alcohol Use: No     Comment: socially  . Drug Use: No  . Sexual Activity: Yes   Other Topics Concern  . None   Social History Narrative   Separated, not on BCP       Review of Systems - See HPI.  All other ROS are negative.  BP 123/88 mmHg  Pulse 96  Temp(Src) 98.8 F (37.1 C) (Oral)  Resp 18  Ht 5' (1.524 m)  Wt 269 lb 4 oz (122.131 kg)  BMI 52.58 kg/m2  SpO2 100%  LMP 09/27/2014  Physical Exam  Constitutional: She is oriented to person, place, and time and well-developed, well-nourished, and in no distress.  HENT:  Head: Normocephalic and atraumatic.  Right Ear: External ear normal.  Left Ear: External ear normal.  Nose: Mucosal edema and rhinorrhea present. Right sinus exhibits maxillary sinus tenderness. Left sinus exhibits maxillary sinus tenderness.  Mouth/Throat: Oropharynx is clear and moist. No oropharyngeal exudate.  Eyes: Conjunctivae are normal.  Neck: Neck supple.  Cardiovascular: Normal rate, regular rhythm, normal heart sounds and intact distal pulses.   Pulmonary/Chest: Effort normal and breath sounds normal. No  respiratory distress. She has no wheezes. She has no rales. She exhibits no tenderness.  Lymphadenopathy:    She has no cervical adenopathy.  Neurological: She is alert and oriented to person, place, and time.  Skin: Skin is warm and dry. No rash noted.  Psychiatric: Affect normal.  Vitals reviewed.   Recent Results (from the past 2160 hour(s))  POC urine preg, ED (not at [facility])     Status: None   Collection Time: 07/16/14 10:37 AM  Result Value Ref Range   Preg Test, Ur NEGATIVE NEGATIVE    Comment:        THE SENSITIVITY OF THIS METHODOLOGY IS >24 mIU/mL     Assessment/Plan: Acute bacterial sinusitis Rx Augmentin.  Increase fluids.  Rest.  Saline nasal spray.  Probiotic.  Mucinex as directed.  Humidifier in bedroom. Tessalon for cough.  Call or return to clinic if symptoms are not  improving.

## 2014-10-11 NOTE — Patient Instructions (Signed)
Please take antibiotic as directed.  Increase fluid intake.  Use Saline nasal spray.  Take a daily multivitamin. Use Tessalon as directed for cough.  Place a humidifier in the bedroom.  Please call or return clinic if symptoms are not improving.  Sinusitis Sinusitis is redness, soreness, and swelling (inflammation) of the paranasal sinuses. Paranasal sinuses are air pockets within the bones of your face (beneath the eyes, the middle of the forehead, or above the eyes). In healthy paranasal sinuses, mucus is able to drain out, and air is able to circulate through them by way of your nose. However, when your paranasal sinuses are inflamed, mucus and air can become trapped. This can allow bacteria and other germs to grow and cause infection. Sinusitis can develop quickly and last only a short time (acute) or continue over a long period (chronic). Sinusitis that lasts for more than 12 weeks is considered chronic.  CAUSES  Causes of sinusitis include:  Allergies.  Structural abnormalities, such as displacement of the cartilage that separates your nostrils (deviated septum), which can decrease the air flow through your nose and sinuses and affect sinus drainage.  Functional abnormalities, such as when the small hairs (cilia) that line your sinuses and help remove mucus do not work properly or are not present. SYMPTOMS  Symptoms of acute and chronic sinusitis are the same. The primary symptoms are pain and pressure around the affected sinuses. Other symptoms include:  Upper toothache.  Earache.  Headache.  Bad breath.  Decreased sense of smell and taste.  A cough, which worsens when you are lying flat.  Fatigue.  Fever.  Thick drainage from your nose, which often is green and may contain pus (purulent).  Swelling and warmth over the affected sinuses. DIAGNOSIS  Your caregiver will perform a physical exam. During the exam, your caregiver may:  Look in your nose for signs of abnormal  growths in your nostrils (nasal polyps).  Tap over the affected sinus to check for signs of infection.  View the inside of your sinuses (endoscopy) with a special imaging device with a light attached (endoscope), which is inserted into your sinuses. If your caregiver suspects that you have chronic sinusitis, one or more of the following tests may be recommended:  Allergy tests.  Nasal culture A sample of mucus is taken from your nose and sent to a lab and screened for bacteria.  Nasal cytology A sample of mucus is taken from your nose and examined by your caregiver to determine if your sinusitis is related to an allergy. TREATMENT  Most cases of acute sinusitis are related to a viral infection and will resolve on their own within 10 days. Sometimes medicines are prescribed to help relieve symptoms (pain medicine, decongestants, nasal steroid sprays, or saline sprays).  However, for sinusitis related to a bacterial infection, your caregiver will prescribe antibiotic medicines. These are medicines that will help kill the bacteria causing the infection.  Rarely, sinusitis is caused by a fungal infection. In theses cases, your caregiver will prescribe antifungal medicine. For some cases of chronic sinusitis, surgery is needed. Generally, these are cases in which sinusitis recurs more than 3 times per year, despite other treatments. HOME CARE INSTRUCTIONS   Drink plenty of water. Water helps thin the mucus so your sinuses can drain more easily.  Use a humidifier.  Inhale steam 3 to 4 times a day (for example, sit in the bathroom with the shower running).  Apply a warm, moist washcloth to your face   3 to 4 times a day, or as directed by your caregiver.  Use saline nasal sprays to help moisten and clean your sinuses.  Take over-the-counter or prescription medicines for pain, discomfort, or fever only as directed by your caregiver. SEEK IMMEDIATE MEDICAL CARE IF:  You have increasing pain or  severe headaches.  You have nausea, vomiting, or drowsiness.  You have swelling around your face.  You have vision problems.  You have a stiff neck.  You have difficulty breathing. MAKE SURE YOU:   Understand these instructions.  Will watch your condition.  Will get help right away if you are not doing well or get worse. Document Released: 04/15/2005 Document Revised: 07/08/2011 Document Reviewed: 04/30/2011 [employer] Patient Information 2014 [employer].   

## 2014-10-14 ENCOUNTER — Other Ambulatory Visit: Payer: Self-pay | Admitting: Family Medicine

## 2014-10-14 NOTE — Telephone Encounter (Signed)
Med filled.  

## 2015-02-03 ENCOUNTER — Encounter: Payer: Self-pay | Admitting: Family Medicine

## 2015-02-14 ENCOUNTER — Emergency Department (HOSPITAL_BASED_OUTPATIENT_CLINIC_OR_DEPARTMENT_OTHER)
Admission: EM | Admit: 2015-02-14 | Discharge: 2015-02-14 | Disposition: A | Discharge status: Home / Self Care | Payer: Medicaid Other | Attending: Emergency Medicine | Admitting: Emergency Medicine

## 2015-02-14 ENCOUNTER — Encounter (HOSPITAL_BASED_OUTPATIENT_CLINIC_OR_DEPARTMENT_OTHER): Payer: Self-pay | Admitting: *Deleted

## 2015-02-14 ENCOUNTER — Emergency Department (HOSPITAL_BASED_OUTPATIENT_CLINIC_OR_DEPARTMENT_OTHER): Payer: Medicaid Other

## 2015-02-14 DIAGNOSIS — X58XXXA Exposure to other specified factors, initial encounter: Secondary | ICD-10-CM | POA: Insufficient documentation

## 2015-02-14 DIAGNOSIS — G43909 Migraine, unspecified, not intractable, without status migrainosus: Secondary | ICD-10-CM | POA: Diagnosis not present

## 2015-02-14 DIAGNOSIS — S46011A Strain of muscle(s) and tendon(s) of the rotator cuff of right shoulder, initial encounter: Secondary | ICD-10-CM

## 2015-02-14 DIAGNOSIS — Z792 Long term (current) use of antibiotics: Secondary | ICD-10-CM | POA: Diagnosis not present

## 2015-02-14 DIAGNOSIS — Z8719 Personal history of other diseases of the digestive system: Secondary | ICD-10-CM | POA: Insufficient documentation

## 2015-02-14 DIAGNOSIS — Z79899 Other long term (current) drug therapy: Secondary | ICD-10-CM | POA: Insufficient documentation

## 2015-02-14 DIAGNOSIS — F419 Anxiety disorder, unspecified: Secondary | ICD-10-CM | POA: Diagnosis not present

## 2015-02-14 DIAGNOSIS — Y9389 Activity, other specified: Secondary | ICD-10-CM | POA: Diagnosis not present

## 2015-02-14 DIAGNOSIS — Y998 Other external cause status: Secondary | ICD-10-CM | POA: Insufficient documentation

## 2015-02-14 DIAGNOSIS — S43401A Unspecified sprain of right shoulder joint, initial encounter: Secondary | ICD-10-CM | POA: Diagnosis not present

## 2015-02-14 DIAGNOSIS — R200 Anesthesia of skin: Secondary | ICD-10-CM | POA: Insufficient documentation

## 2015-02-14 DIAGNOSIS — I10 Essential (primary) hypertension: Secondary | ICD-10-CM | POA: Insufficient documentation

## 2015-02-14 DIAGNOSIS — Y9289 Other specified places as the place of occurrence of the external cause: Secondary | ICD-10-CM | POA: Diagnosis not present

## 2015-02-14 DIAGNOSIS — Z862 Personal history of diseases of the blood and blood-forming organs and certain disorders involving the immune mechanism: Secondary | ICD-10-CM | POA: Diagnosis not present

## 2015-02-14 DIAGNOSIS — S4991XA Unspecified injury of right shoulder and upper arm, initial encounter: Secondary | ICD-10-CM | POA: Diagnosis present

## 2015-02-14 DIAGNOSIS — Z87891 Personal history of nicotine dependence: Secondary | ICD-10-CM | POA: Insufficient documentation

## 2015-02-14 DIAGNOSIS — S46911A Strain of unspecified muscle, fascia and tendon at shoulder and upper arm level, right arm, initial encounter: Secondary | ICD-10-CM | POA: Diagnosis not present

## 2015-02-14 DIAGNOSIS — F329 Major depressive disorder, single episode, unspecified: Secondary | ICD-10-CM | POA: Diagnosis not present

## 2015-02-14 MED ORDER — MELOXICAM 7.5 MG PO TABS: 7.5000 mg | ORAL_TABLET | Freq: Every day | ORAL | Status: DC

## 2015-02-14 MED ORDER — HYDROCODONE-ACETAMINOPHEN 5-325 MG PO TABS: 1.0000 | ORAL_TABLET | Freq: Four times a day (QID) | ORAL | Status: DC | PRN

## 2015-02-14 MED ORDER — CYCLOBENZAPRINE HCL 10 MG PO TABS: 10.0000 mg | ORAL_TABLET | Freq: Two times a day (BID) | ORAL | Status: DC | PRN

## 2015-02-14 NOTE — ED Notes (Signed)
Patient states she has a one and one half month history of right shoulder pain.  Pain is worse with movement and has intermittent numbness in the entire arm.  No known injury.

## 2015-02-14 NOTE — Discharge Instructions (Signed)
Rotator Cuff Injury °Rotator cuff injury is any type of injury to the set of muscles and tendons that make up the stabilizing unit of your shoulder. This unit holds the ball of your upper arm bone (humerus) in the socket of your shoulder blade (scapula).  °CAUSES °Injuries to your rotator cuff most commonly come from sports or activities that cause your arm to be moved repeatedly over your head. Examples of this include throwing, weight lifting, swimming, or racquet sports. Long lasting (chronic) irritation of your rotator cuff can cause soreness and swelling (inflammation), bursitis, and eventual damage to your tendons, such as a tear (rupture). °SIGNS AND SYMPTOMS °Acute rotator cuff tear: °· Sudden tearing sensation followed by severe pain shooting from your upper shoulder down your arm toward your elbow. °· Decreased range of motion of your shoulder because of pain and muscle spasm. °· Severe pain. °· Inability to raise your arm out to the side because of pain and loss of muscle power (large tears). °Chronic rotator cuff tear: °· Pain that usually is worse at night and may interfere with sleep. °· Gradual weakness and decreased shoulder motion as the pain worsens. °· Decreased range of motion. °Rotator cuff tendinitis:  °· Deep ache in your shoulder and the outside upper arm over your shoulder. °· Pain that comes on gradually and becomes worse when lifting your arm to the side or turning it inward. °DIAGNOSIS °Rotator cuff injury is diagnosed through a medical history, physical exam, and imaging exam. The medical history helps determine the type of rotator cuff injury. Your health care provider will look at your injured shoulder, feel the injured area, and ask you to move your shoulder in different positions. X-ray exams typically are done to rule out other causes of shoulder pain, such as fractures. MRI is the exam of choice for the most severe shoulder injuries because the images show muscles and tendons.    °TREATMENT  °Chronic tear: °· Medicine for pain, such as acetaminophen or ibuprofen. °· Physical therapy and range-of-motion exercises may be helpful in maintaining shoulder function and strength. °· Steroid injections into your shoulder joint. °· Surgical repair of the rotator cuff if the injury does not heal with noninvasive treatment. °Acute tear: °· Anti-inflammatory medicines such as ibuprofen and naproxen to help reduce pain and swelling. °· A sling to help support your arm and rest your rotator cuff muscles. Long-term use of a sling is not advised. It may cause significant stiffening of the shoulder joint. °· Surgery may be considered within a few weeks, especially in younger, active people, to return the shoulder to full function. °· Indications for surgical treatment include the following: °¨ Age younger than 60 years. °¨ Rotator cuff tears that are complete. °¨ Physical therapy, rest, and anti-inflammatory medicines have been used for 6-8 weeks, with no improvement. °¨ Employment or sporting activity that requires constant shoulder use. °Tendinitis: °· Anti-inflammatory medicines such as ibuprofen and naproxen to help reduce pain and swelling. °· A sling to help support your arm and rest your rotator cuff muscles. Long-term use of a sling is not advised. It may cause significant stiffening of the shoulder joint. °· Severe tendinitis may require: °¨ Steroid injections into your shoulder joint. °¨ Physical therapy. °¨ Surgery. °HOME CARE INSTRUCTIONS  °· Apply ice to your injury: °¨ Put ice in a plastic bag. °¨ Place a towel between your skin and the bag. °¨ Leave the ice on for 20 minutes, 2-3 times a day. °· If you   have a shoulder immobilizer (sling and straps), wear it until told otherwise by your health care provider. °· You may want to sleep on several pillows or in a recliner at night to lessen swelling and pain. °· Only take over-the-counter or prescription medicines for pain, discomfort, or fever as  directed by your health care provider. °· Do simple hand squeezing exercises with a soft rubber ball to decrease hand swelling. °SEEK MEDICAL CARE IF:  °· Your shoulder pain increases, or new pain or numbness develops in your arm, hand, or fingers. °· Your hand or fingers are colder than your other hand. °SEEK IMMEDIATE MEDICAL CARE IF:  °· Your arm, hand, or fingers are numb or tingling. °· Your arm, hand, or fingers are increasingly swollen and painful, or they turn white or blue. °MAKE SURE YOU: °· Understand these instructions. °· Will watch your condition. °· Will get help right away if you are not doing well or get worse. °  °This information is not intended to replace advice given to you by your health care provider. Make sure you discuss any questions you have with your health care provider. °  °Document Released: 04/12/2000 Document Revised: 04/20/2013 Document Reviewed: 11/25/2012 °Elsevier Interactive Patient Education ©2016 [employer] ° °

## 2015-02-14 NOTE — ED Provider Notes (Signed)
CSN: [id number]     Arrival date & time 02/14/15  0711 History   None    Chief Complaint  Patient presents with  . Shoulder Pain     (Consider location/radiation/quality/duration/timing/severity/associated sxs/prior Treatment) Patient is a 39 y.o. female presenting with shoulder pain. The history is provided by the patient.  Shoulder Pain Location:  Shoulder Time since incident:  6 weeks Injury: no   Shoulder location:  R shoulder Pain details:    Quality:  Aching, shooting, throbbing and sharp   Radiates to:  R arm   Severity:  Severe   Onset quality:  Gradual   Timing:  Constant   Progression:  Worsening Chronicity:  New Handedness:  Right-handed Prior injury to area:  No Relieved by:  Rest Worsened by:  Movement Ineffective treatments:  Acetaminophen and NSAIDs Associated symptoms: decreased range of motion and numbness   Associated symptoms: no muscle weakness   Associated symptoms comment:  Intermittent numbness of the hand that occurs several times a day and started a few weeks ago.  Also noticing some tightness to the right side of the neck   Past Medical History  Diagnosis Date  . Hemorrhoid   . Migraine   . Anemia   . Depression   . Anxiety   . Blood transfusion without reported diagnosis   . Hypertension    Past Surgical History  Procedure Laterality Date  . Ventral hernia repair    . Hemorrhoid surgery      Oct 2011  . Tonsillectomy    . Hernia repair    . Hemorrhoid surgery     Family History  Problem Relation Age of Onset  . Osteoarthritis Mother   . Diabetes Mother   . Hypertension Mother   . Depression Mother   . Hypertension Father   . Diabetes Father   . Asthma Son   . Stroke Neg Hx   . Deep vein thrombosis Maternal Grandmother   . Deep vein thrombosis Maternal Grandfather    Social History  Substance Use Topics  . Smoking status: Former Smoker -- 10 years  . Smokeless tobacco: Never Used     Comment: smoked 1994-2012, up to 1 pp  WEEK  . Alcohol Use: No     Comment: socially   OB History    No data available     Review of Systems  All other systems reviewed and are negative.     Allergies  Review of patient's allergies indicates no known allergies.  Home Medications   Prior to Admission medications   Medication Sig Start Date End Date Taking? Authorizing Provider  amLODipine (NORVASC) 2.5 MG tablet TAKE 1 TABLET BY MOUTH DAILY FOR HYPERTENSION 10/14/14  Yes Sheliah HatchKatherine E Tabori, MD  buPROPion (WELLBUTRIN XL) 300 MG 24 hr tablet Take 300 mg by mouth daily.   Yes Historical Provider, MD  clonazePAM (KLONOPIN) 0.5 MG tablet Take 1 tablet (0.5 mg total) by mouth 3 (three) times daily as needed for anxiety. 07/30/13  Yes Lloyd HugerNeil T Mashburn, PA-C  metoprolol succinate (TOPROL-XL) 25 MG 24 hr tablet Take 1 tablet (25 mg total) by mouth daily. 09/14/14  Yes Sheliah HatchKatherine E Tabori, MD  QUEtiapine (SEROQUEL) 400 MG tablet Take 400 mg by mouth at bedtime.   Yes Historical Provider, MD  zolpidem (AMBIEN) 10 MG tablet Take 10 mg by mouth at bedtime as needed for sleep.  08/08/13  Yes Historical Provider, MD  amoxicillin-clavulanate (AUGMENTIN) 875-125 MG per tablet Take 1 tablet by mouth  2 (two) times daily. 10/11/14   Waldon Merl, PA-C  benzonatate (TESSALON) 200 MG capsule Take 1 capsule (200 mg total) by mouth 2 (two) times daily as needed for cough. 10/11/14   Waldon Merl, PA-C  methocarbamol (ROBAXIN) 500 MG tablet Take 1 tablet (500 mg total) by mouth every 8 (eight) hours as needed for muscle spasms. 11/16/13   Kelle Darting, NP   BP 144/92 mmHg  Pulse 104  Temp(Src) 98.1 F (36.7 C) (Oral)  Resp 18  Ht 5' (1.524 m)  Wt 275 lb (124.739 kg)  BMI 53.71 kg/m2  SpO2 100% Physical Exam  Constitutional: She is oriented to person, place, and time. She appears well-developed and well-nourished. No distress.  HENT:  Head: Normocephalic and atraumatic.  Eyes: EOM are normal. Pupils are equal, round, and reactive to  light.  Cardiovascular: Normal rate.   Pulmonary/Chest: Effort normal.  Musculoskeletal:       Right shoulder: She exhibits decreased range of motion and tenderness.  Tenderness along the right AC joint.  Pain is exacerbated with internal and external rotation of the shoulder joint as well as abduction. Limited active range of motion with abduction of the shoulder but with passive motion can go through full range of motion but painful. 2+ radial pulse on the right with normal hand grip and sensation. Palpable right-sided trapezius spasm.  No midline cervical tenderness.  Only minimal axillary tenderness  Neurological: She is alert and oriented to person, place, and time.  Skin: Skin is warm and dry.  Psychiatric: She has a normal mood and affect. Her behavior is normal.  Nursing note and vitals reviewed.   ED Course  Procedures (including critical care time) Labs Review Labs Reviewed - No data to display  Imaging Review Dg Shoulder Right  02/14/2015  CLINICAL DATA:  Chronic right shoulder pain four year without known injury. EXAM: RIGHT SHOULDER - 2+ VIEW COMPARISON:  None. FINDINGS: There is no evidence of fracture or dislocation. There is no evidence of arthropathy or other focal bone abnormality. Soft tissues are unremarkable. IMPRESSION: Normal right shoulder. Electronically Signed   By: Lupita Raider, M.D.   On: 02/14/2015 07:56   I have personally reviewed and evaluated these images and lab results as part of my medical decision-making.   EKG Interpretation None      MDM   Final diagnoses:  Rotator cuff (capsule) sprain and strain, right, initial encounter    Patient with complaints of shoulder pain for the last month and a half most consistent with rotator cuff issues. She denies any known injury but several weeks ago started to develop intermittent numbness of the hand with ongoing worsening associated pain. She has attempted ibuprofen, Tylenol and even some leftover  tramadol without improvement in symptoms.  On exam she has significant tenderness in the [facility] joint with significant tenderness with internal and external rotation of the shoulder as well as abduction and limited range of motion with abduction. Also associated muscle spasm in the trapezius. Currently normal sensation of the hand with normal strength.  Imaging within normal limits. Will start patient on meloxicam, Flexeril and pain control. Will follow-up with orthopedics for further evaluation    Gwyneth Sprout, MD 02/14/15 [phone]

## 2015-06-24 ENCOUNTER — Encounter (HOSPITAL_BASED_OUTPATIENT_CLINIC_OR_DEPARTMENT_OTHER): Payer: Self-pay | Admitting: *Deleted

## 2015-06-24 ENCOUNTER — Emergency Department (HOSPITAL_BASED_OUTPATIENT_CLINIC_OR_DEPARTMENT_OTHER)
Admission: EM | Admit: 2015-06-24 | Discharge: 2015-06-24 | Disposition: A | Discharge status: Home / Self Care | Payer: Medicaid Other | Attending: Emergency Medicine | Admitting: Emergency Medicine

## 2015-06-24 ENCOUNTER — Emergency Department (HOSPITAL_BASED_OUTPATIENT_CLINIC_OR_DEPARTMENT_OTHER): Payer: Medicaid Other

## 2015-06-24 DIAGNOSIS — F419 Anxiety disorder, unspecified: Secondary | ICD-10-CM | POA: Insufficient documentation

## 2015-06-24 DIAGNOSIS — E669 Obesity, unspecified: Secondary | ICD-10-CM | POA: Diagnosis not present

## 2015-06-24 DIAGNOSIS — M7989 Other specified soft tissue disorders: Secondary | ICD-10-CM | POA: Diagnosis not present

## 2015-06-24 DIAGNOSIS — M25562 Pain in left knee: Secondary | ICD-10-CM | POA: Insufficient documentation

## 2015-06-24 DIAGNOSIS — Z862 Personal history of diseases of the blood and blood-forming organs and certain disorders involving the immune mechanism: Secondary | ICD-10-CM | POA: Insufficient documentation

## 2015-06-24 DIAGNOSIS — Z87891 Personal history of nicotine dependence: Secondary | ICD-10-CM | POA: Diagnosis not present

## 2015-06-24 DIAGNOSIS — Z79899 Other long term (current) drug therapy: Secondary | ICD-10-CM | POA: Diagnosis not present

## 2015-06-24 DIAGNOSIS — F329 Major depressive disorder, single episode, unspecified: Secondary | ICD-10-CM | POA: Insufficient documentation

## 2015-06-24 DIAGNOSIS — I10 Essential (primary) hypertension: Secondary | ICD-10-CM | POA: Insufficient documentation

## 2015-06-24 DIAGNOSIS — Z8719 Personal history of other diseases of the digestive system: Secondary | ICD-10-CM | POA: Diagnosis not present

## 2015-06-24 MED ORDER — MELOXICAM 15 MG PO TABS: 15.0000 mg | ORAL_TABLET | Freq: Every day | ORAL | Status: DC

## 2015-06-24 NOTE — ED Provider Notes (Signed)
CSN: [id number]     Arrival date & time 06/24/15  1717 History   First MD Initiated Contact with Patient 06/24/15 1815     Chief Complaint  Patient presents with  . Knee Pain     (Consider location/radiation/quality/duration/timing/severity/associated sxs/prior Treatment) The history is provided by the patient and medical records. No language interpreter was used.     Marilyn Fowler is a 40 y.o. female  with a hx of obesity, migraine, HTN presents to the Emergency Department complaining of gradual, persistent, progressively worsening left knee pain onset 1 month ago.  Pt reports using ice, elevation, ibuprofen, mobic, tylenol and ultram without relief.  Pt reports she though that she had twisted her knee, but does not remember doing so.  Pt reports she is having anterior and posterior knee pain. Associated symptoms include mild swelling.  Pt denies surgery on her knee.  Denies fever, chills, nausea, vomiting.  Pt reports pain is worse with standing or bending.  Pt reports she just can't take the pain anymore.      Past Medical History  Diagnosis Date  . Hemorrhoid   . Migraine   . Anemia   . Depression   . Anxiety   . Blood transfusion without reported diagnosis   . Hypertension    Past Surgical History  Procedure Laterality Date  . Ventral hernia repair    . Hemorrhoid surgery      Oct 2011  . Tonsillectomy    . Hernia repair    . Hemorrhoid surgery     Family History  Problem Relation Age of Onset  . Osteoarthritis Mother   . Diabetes Mother   . Hypertension Mother   . Depression Mother   . Hypertension Father   . Diabetes Father   . Asthma Son   . Stroke Neg Hx   . Deep vein thrombosis Maternal Grandmother   . Deep vein thrombosis Maternal Grandfather    Social History  Substance Use Topics  . Smoking status: Former Smoker -- 10 years  . Smokeless tobacco: Never Used     Comment: smoked 1994-2012, up to 1 pp WEEK  . Alcohol Use: No     Comment: socially    OB History    No data available     Review of Systems  Constitutional: Negative for fever and chills.  Gastrointestinal: Negative for nausea and vomiting.  Musculoskeletal: Positive for joint swelling and arthralgias ( left knee). Negative for back pain, neck pain and neck stiffness.  Skin: Negative for wound.  Neurological: Negative for numbness.  Hematological: Does not bruise/bleed easily.  Psychiatric/Behavioral: The patient is not nervous/anxious.   All other systems reviewed and are negative.     Allergies  Review of patient's allergies indicates no known allergies.  Home Medications   Prior to Admission medications   Medication Sig Start Date End Date Taking? Authorizing Provider  amLODipine (NORVASC) 2.5 MG tablet TAKE 1 TABLET BY MOUTH DAILY FOR HYPERTENSION 10/14/14  Yes Sheliah Hatch, MD  buPROPion (WELLBUTRIN XL) 300 MG 24 hr tablet Take 300 mg by mouth daily.   Yes Historical Provider, MD  clonazePAM (KLONOPIN) 0.5 MG tablet Take 1 tablet (0.5 mg total) by mouth 3 (three) times daily as needed for anxiety. 07/30/13  Yes Lloyd Huger T Mashburn, PA-C  metoprolol succinate (TOPROL-XL) 25 MG 24 hr tablet Take 1 tablet (25 mg total) by mouth daily. 09/14/14  Yes Sheliah Hatch, MD  QUEtiapine (SEROQUEL) 400 MG tablet Take 400 mg  by mouth at bedtime.   Yes Historical Provider, MD  zolpidem (AMBIEN) 10 MG tablet Take 10 mg by mouth at bedtime as needed for sleep.  08/08/13  Yes Historical Provider, MD  meloxicam (MOBIC) 15 MG tablet Take 1 tablet (15 mg total) by mouth daily. 06/24/15   Jaycie Kregel, PA-C   BP 114/76 mmHg  Pulse 86  Temp(Src) 98.8 F (37.1 C) (Oral)  Resp 22  Ht 5' (1.524 m)  Wt 131.543 kg  BMI 56.64 kg/m2  SpO2 98%  LMP 05/14/2015 (Approximate) Physical Exam  Constitutional: She appears well-developed and well-nourished. No distress.  obese  HENT:  Head: Normocephalic and atraumatic.  Eyes: Conjunctivae are normal.  Neck: Normal range of  motion.  Cardiovascular: Normal rate, regular rhythm, normal heart sounds and intact distal pulses.   No murmur heard. Capillary refill < 3 sec  Pulmonary/Chest: Effort normal and breath sounds normal.  Musculoskeletal: She exhibits tenderness. She exhibits no edema.  ROM: Full extension without difficulty, fairly restricted flexion of the left knee No erythema, increased warmth No abnormal patellar movement Patellar tendon intact Range of motion of the left ankle and in all toes of the left foot  Neurological: She is alert. Coordination normal.  Sensation intact to dull and sharp Strength 5/5 with dorsiflexion and plantar flexion, 4/5 with flexion extension of the left knee due to pain  Skin: Skin is warm and dry. No rash noted. She is not diaphoretic. No erythema.  No tenting of the skin  Psychiatric: She has a normal mood and affect.  Nursing note and vitals reviewed.   ED Course  Procedures (including critical care time)  Imaging Review Dg Knee Complete 4 Views Left  06/24/2015  CLINICAL DATA:  Left knee pain, no known injury, initial encounter EXAM: LEFT KNEE - COMPLETE 4+ VIEW COMPARISON:  None. FINDINGS: There is no evidence of fracture, dislocation, or joint effusion. There is no evidence of arthropathy or other focal bone abnormality. Soft tissues are unremarkable. IMPRESSION: No acute abnormality noted. Electronically Signed   By: Alcide Clever M.D.   On: 06/24/2015 18:44   I have personally reviewed and evaluated these images and lab results as part of my medical decision-making.    MDM   Final diagnoses:  Arthralgia of left knee   Marilyn Fowler presents with left knee pain ongoing for > 1 month.  Patient X-Ray negative for obvious fracture or dislocation. Pain managed in ED. Pt advised to follow up with orthopedics if symptoms persist for possibility of missed fracture diagnosis. Patient given brace while in ED, conservative therapy recommended and discussed. Patient  will be dc home & is agreeable with above plan.    Marilyn Client Rhondalyn Clingan, PA-C 06/24/15 1903  Tilden Fossa, MD 06/26/15 [phone]

## 2015-06-24 NOTE — Discharge Instructions (Signed)
1. Medications: Use Mobic daily, use Tylenol for additional pain control, usual home medications 2. Treatment: rest, ice, elevate and use brace, drink plenty of fluids, gentle stretching 3. Follow Up: Please followup with orthopedics as directed or your PCP in 1 week if no improvement for discussion of your diagnoses and further evaluation after today's visit; if you do not have a primary care doctor use the resource guide provided to find one; Please return to the ER for worsening symptoms or other concerns    Cryotherapy Cryotherapy means treatment with cold. Ice or gel packs can be used to reduce both pain and swelling. Ice is the most helpful within the first 24 to 48 hours after an injury or flare-up from overusing a muscle or joint. Sprains, strains, spasms, burning pain, shooting pain, and aches can all be eased with ice. Ice can also be used when recovering from surgery. Ice is effective, has very few side effects, and is safe for most people to use. PRECAUTIONS  Ice is not a safe treatment option for people with:  Raynaud phenomenon. This is a condition affecting small blood vessels in the extremities. Exposure to cold may cause your problems to return.  Cold hypersensitivity. There are many forms of cold hypersensitivity, including:  Cold urticaria. Red, itchy hives appear on the skin when the tissues begin to warm after being iced.  Cold erythema. This is a red, itchy rash caused by exposure to cold.  Cold hemoglobinuria. Red blood cells break down when the tissues begin to warm after being iced. The hemoglobin that carry oxygen are passed into the urine because they cannot combine with blood proteins fast enough.  Numbness or altered sensitivity in the area being iced. If you have any of the following conditions, do not use ice until you have discussed cryotherapy with your caregiver:  Heart conditions, such as arrhythmia, angina, or chronic heart disease.  High blood  pressure.  Healing wounds or open skin in the area being iced.  Current infections.  Rheumatoid arthritis.  Poor circulation.  Diabetes. Ice slows the blood flow in the region it is applied. This is beneficial when trying to stop inflamed tissues from spreading irritating chemicals to surrounding tissues. However, if you expose your skin to cold temperatures for too long or without the proper protection, you can damage your skin or nerves. Watch for signs of skin damage due to cold. HOME CARE INSTRUCTIONS Follow these tips to use ice and cold packs safely.  Place a dry or damp towel between the ice and skin. A damp towel will cool the skin more quickly, so you may need to shorten the time that the ice is used.  For a more rapid response, add gentle compression to the ice.  Ice for no more than 10 to 20 minutes at a time. The bonier the area you are icing, the less time it will take to get the benefits of ice.  Check your skin after 5 minutes to make sure there are no signs of a poor response to cold or skin damage.  Rest 20 minutes or more between uses.  Once your skin is numb, you can end your treatment. You can test numbness by very lightly touching your skin. The touch should be so light that you do not see the skin dimple from the pressure of your fingertip. When using ice, most people will feel these normal sensations in this order: cold, burning, aching, and numbness.  Do not use ice on  someone who cannot communicate their responses to pain, such as small children or people with dementia. HOW TO MAKE AN ICE PACK Ice packs are the most common way to use ice therapy. Other methods include ice massage, ice baths, and cryosprays. Muscle creams that cause a cold, tingly feeling do not offer the same benefits that ice offers and should not be used as a substitute unless recommended by your caregiver. To make an ice pack, do one of the following:  Place crushed ice or a bag of frozen  vegetables in a sealable plastic bag. Squeeze out the excess air. Place this bag inside another plastic bag. Slide the bag into a pillowcase or place a damp towel between your skin and the bag.  Mix 3 parts water with 1 part rubbing alcohol. Freeze the mixture in a sealable plastic bag. When you remove the mixture from the freezer, it will be slushy. Squeeze out the excess air. Place this bag inside another plastic bag. Slide the bag into a pillowcase or place a damp towel between your skin and the bag. SEEK MEDICAL CARE IF:  You develop white spots on your skin. This may give the skin a blotchy (mottled) appearance.  Your skin turns blue or pale.  Your skin becomes waxy or hard.  Your swelling gets worse. MAKE SURE YOU:   Understand these instructions.  Will watch your condition.  Will get help right away if you are not doing well or get worse.   This information is not intended to replace advice given to you by your health care provider. Make sure you discuss any questions you have with your health care provider.   Document Released: 12/10/2010 Document Revised: 05/06/2014 Document Reviewed: 12/10/2010 Elsevier Interactive Patient Education [employer]

## 2015-06-24 NOTE — ED Notes (Signed)
Pt reports L knee pain x22month; denies known injury. Pt reports she has not been medically evaluated for pain -- reports using ice, Tylenol, Motrin, Ultram and Meloxicam with no relief. Reports intermittent tingling in toes.

## 2015-06-29 ENCOUNTER — Ambulatory Visit (INDEPENDENT_AMBULATORY_CARE_PROVIDER_SITE_OTHER): Payer: Medicaid Other | Admitting: Family Medicine

## 2015-06-29 ENCOUNTER — Encounter: Payer: Self-pay | Admitting: Family Medicine

## 2015-06-29 VITALS — BP 122/82 | HR 108 | Temp 98.1°F | Resp 17 | Ht 60.0 in | Wt 294.0 lb

## 2015-06-29 DIAGNOSIS — M25562 Pain in left knee: Secondary | ICD-10-CM

## 2015-06-29 MED ORDER — HYDROCODONE-ACETAMINOPHEN 5-325 MG PO TABS: 1.0000 | ORAL_TABLET | Freq: Four times a day (QID) | ORAL | Status: DC | PRN

## 2015-06-29 NOTE — Assessment & Plan Note (Signed)
New.  Pt was told in ER that she had a Baker's cyst and needs ortho referral.  Reports medication is not helping w/ her pain level- script for vicodin given.  Referral placed but cautioned pt that since she is [place] [facility] that this referral may not be able to come from me.  Pt expressed understanding and is in agreement w/ plan.

## 2015-06-29 NOTE — Progress Notes (Signed)
Pre visit review using our clinic review tool, if applicable. No additional management support is needed unless otherwise documented below in the visit note. 

## 2015-06-29 NOTE — Patient Instructions (Signed)
Follow up as needed We'll call you with your ortho appt for the knee Continue the daily Mobic for inflammation Use the pain meds as needed ICE! Call with any questions or concerns Hang in there!!!

## 2015-06-29 NOTE — Progress Notes (Signed)
   Subjective:    Patient ID: Marilyn Fowler, female    DOB: Jun 24, 1975, 40 y.o.   MRN: [id number]  HPI L knee pain- went to ER on 2/25 for L knee pain and inability to bend knee.  xrays were negative.  Was dx'd w/ Baker's cyst.  Very painful for pt.  Pt needs referral for Ortho- prefers [facility].  Pt reports that tylenol, ibuprofen, mobic, and Ultram are not providing relief.  'when I try and bend my knee even a little bit, it feels like it's going to explode'.   Review of Systems For ROS see HPI     Objective:   Physical Exam  Constitutional: She appears well-developed and well-nourished.  Morbidly obese  HENT:  Head: Normocephalic and atraumatic.  Cardiovascular: Intact distal pulses.   Musculoskeletal: She exhibits edema (mild swelling of L medial knee) and tenderness (TTP over L medial knee w/ some swelling present).  Pain w/ any type of flexion- pt prefers to keep leg in full extension  Skin: Skin is warm and dry. No erythema.  Psychiatric: She has a normal mood and affect. Her behavior is normal. Thought content normal.  Vitals reviewed.         Assessment & Plan:

## 2015-07-11 ENCOUNTER — Other Ambulatory Visit: Payer: Self-pay | Admitting: Family Medicine

## 2015-07-12 NOTE — Telephone Encounter (Signed)
Medication filled to pharmacy as requested.   

## 2015-07-31 ENCOUNTER — Other Ambulatory Visit: Payer: Self-pay | Admitting: Family Medicine

## 2015-07-31 MED ORDER — HYDROCODONE-ACETAMINOPHEN 5-325 MG PO TABS: 1.0000 | ORAL_TABLET | Freq: Four times a day (QID) | ORAL | Status: DC | PRN

## 2015-07-31 NOTE — Telephone Encounter (Signed)
Last OV 06/29/15 Hydrocodone last filled 06/29/15 #30 with 0

## 2015-11-28 ENCOUNTER — Emergency Department (HOSPITAL_BASED_OUTPATIENT_CLINIC_OR_DEPARTMENT_OTHER)
Admission: EM | Admit: 2015-11-28 | Discharge: 2015-11-28 | Disposition: A | Discharge status: Home / Self Care | Payer: Medicaid Other | Attending: Emergency Medicine | Admitting: Emergency Medicine

## 2015-11-28 ENCOUNTER — Encounter (HOSPITAL_BASED_OUTPATIENT_CLINIC_OR_DEPARTMENT_OTHER): Payer: Self-pay | Admitting: Emergency Medicine

## 2015-11-28 ENCOUNTER — Emergency Department (HOSPITAL_BASED_OUTPATIENT_CLINIC_OR_DEPARTMENT_OTHER): Payer: Medicaid Other

## 2015-11-28 DIAGNOSIS — S8002XA Contusion of left knee, initial encounter: Secondary | ICD-10-CM | POA: Insufficient documentation

## 2015-11-28 DIAGNOSIS — R519 Headache, unspecified: Secondary | ICD-10-CM

## 2015-11-28 DIAGNOSIS — Y939 Activity, unspecified: Secondary | ICD-10-CM | POA: Diagnosis not present

## 2015-11-28 DIAGNOSIS — Y999 Unspecified external cause status: Secondary | ICD-10-CM | POA: Diagnosis not present

## 2015-11-28 DIAGNOSIS — W010XXA Fall on same level from slipping, tripping and stumbling without subsequent striking against object, initial encounter: Secondary | ICD-10-CM | POA: Insufficient documentation

## 2015-11-28 DIAGNOSIS — Z79899 Other long term (current) drug therapy: Secondary | ICD-10-CM | POA: Diagnosis not present

## 2015-11-28 DIAGNOSIS — R51 Headache: Secondary | ICD-10-CM | POA: Diagnosis not present

## 2015-11-28 DIAGNOSIS — I10 Essential (primary) hypertension: Secondary | ICD-10-CM | POA: Diagnosis not present

## 2015-11-28 DIAGNOSIS — Z87891 Personal history of nicotine dependence: Secondary | ICD-10-CM | POA: Insufficient documentation

## 2015-11-28 DIAGNOSIS — S8992XA Unspecified injury of left lower leg, initial encounter: Secondary | ICD-10-CM | POA: Diagnosis present

## 2015-11-28 DIAGNOSIS — Y929 Unspecified place or not applicable: Secondary | ICD-10-CM | POA: Insufficient documentation

## 2015-11-28 LAB — CBC WITH DIFFERENTIAL/PLATELET
Basophils Absolute: 0 K/uL (ref 0.0–0.1)
Basophils Relative: 0 %
Eosinophils Absolute: 0.1 K/uL (ref 0.0–0.7)
Eosinophils Relative: 1 %
HCT: 37.8 % (ref 36.0–46.0)
Hemoglobin: 12.3 g/dL (ref 12.0–15.0)
Lymphocytes Relative: 29 %
Lymphs Abs: 2.1 K/uL (ref 0.7–4.0)
MCH: 27.4 pg (ref 26.0–34.0)
MCHC: 32.5 g/dL (ref 30.0–36.0)
MCV: 84.2 fL (ref 78.0–100.0)
Monocytes Absolute: 0.6 K/uL (ref 0.1–1.0)
Monocytes Relative: 9 %
Neutro Abs: 4.4 K/uL (ref 1.7–7.7)
Neutrophils Relative %: 61 %
Platelets: 285 K/uL (ref 150–400)
RBC: 4.49 MIL/uL (ref 3.87–5.11)
RDW: 14.9 % (ref 11.5–15.5)
WBC: 7.3 K/uL (ref 4.0–10.5)

## 2015-11-28 LAB — URINALYSIS, ROUTINE W REFLEX MICROSCOPIC
Bilirubin Urine: NEGATIVE
Glucose, UA: NEGATIVE mg/dL
Hgb urine dipstick: NEGATIVE
Ketones, ur: NEGATIVE mg/dL
Leukocytes, UA: NEGATIVE
Nitrite: NEGATIVE
Protein, ur: NEGATIVE mg/dL
Specific Gravity, Urine: 1.028 (ref 1.005–1.030)
pH: 6 (ref 5.0–8.0)

## 2015-11-28 LAB — BASIC METABOLIC PANEL WITH GFR
Anion gap: 8 (ref 5–15)
BUN: 8 mg/dL (ref 6–20)
CO2: 24 mmol/L (ref 22–32)
Calcium: 8.8 mg/dL — ABNORMAL LOW (ref 8.9–10.3)
Chloride: 107 mmol/L (ref 101–111)
Creatinine, Ser: 0.62 mg/dL (ref 0.44–1.00)
GFR calc Af Amer: >60 mL/min (ref >60)
GFR calc non Af Amer: >60 mL/min (ref >60)
Glucose, Bld: 97 mg/dL (ref 65–99)
Potassium: 3.9 mmol/L (ref 3.5–5.1)
Sodium: 139 mmol/L (ref 135–145)

## 2015-11-28 LAB — PREGNANCY, URINE: Preg Test, Ur: NEGATIVE

## 2015-11-28 MED ORDER — SODIUM CHLORIDE 0.9 % IV BOLUS (SEPSIS)
1000.0000 mL | Freq: Once | INTRAVENOUS | Status: AC
  Administered 2015-11-28: 1000 mL via INTRAVENOUS

## 2015-11-28 MED ORDER — METOCLOPRAMIDE HCL 5 MG/ML IJ SOLN
10.0000 mg | Freq: Once | INTRAMUSCULAR | Status: AC
  Administered 2015-11-28: 10 mg via INTRAVENOUS
  Filled 2015-11-28: qty 2

## 2015-11-28 MED ORDER — MELOXICAM 7.5 MG PO TABS: 7.5000 mg | ORAL_TABLET | Freq: Every day | ORAL | 0 refills | Status: DC | PRN

## 2015-11-28 MED ORDER — KETOROLAC TROMETHAMINE 30 MG/ML IJ SOLN
30.0000 mg | Freq: Once | INTRAMUSCULAR | Status: AC
  Administered 2015-11-28: 30 mg via INTRAVENOUS
  Filled 2015-11-28: qty 1

## 2015-11-28 MED ORDER — DIPHENHYDRAMINE HCL 50 MG/ML IJ SOLN
25.0000 mg | Freq: Once | INTRAMUSCULAR | Status: AC
  Administered 2015-11-28: 25 mg via INTRAVENOUS
  Filled 2015-11-28: qty 1

## 2015-11-28 MED FILL — MELOXICAM 7.5 MG TABLET: 7.5 | 10 days supply | Qty: 20 | Fill #0

## 2015-11-28 NOTE — ED Notes (Signed)
Approx NS IV infused, pt refusing to complete bolus at this time, stating she "doesn't want to have to worry about getting up to pee again" and that she feels "much better and ready to go home."  Pt unhooked from IV fluids, IV left in place pending EDP discharge.

## 2015-11-28 NOTE — ED Notes (Signed)
Emily PA-C in room with pt now.

## 2015-11-28 NOTE — ED Notes (Signed)
Pt states she is unable to void at this time.

## 2015-11-28 NOTE — Discharge Instructions (Signed)
Read the information below.  Use the prescribed medication as directed.  Please discuss all new medications with your pharmacist.  You may return to the Emergency Department at any time for worsening condition or any new symptoms that concern you.     If you develop uncontrolled pain, weakness or numbness of the extremity, severe discoloration of the skin, or you are unable to walk or move your knee, return to the ER for a recheck.    °

## 2015-11-28 NOTE — ED Triage Notes (Signed)
Pt reports headache for past few days, worse yesterday, less severe this morning.  But that this morning she had a "dizzy spell" while ambulating to bathroom, tripped and fell hurting her left knee.

## 2015-11-28 NOTE — ED Provider Notes (Signed)
[facility]   CSN: [id number] Arrival date & time: 11/28/15  0900  First Provider Contact:  First MD Initiated Contact with Patient 11/28/15 [phone]        History   Chief Complaint Chief Complaint  Patient presents with  . Knee Pain  . Dizziness    HPI Marilyn Fowler is a 40 y.o. female.  HPI   Patient with hx migraine, chronic left knee pain p/w 6 days of headache following by incident today in which she got up quickly to go to the bathroom, felt lightheaded and fell onto her left knee onto a tile floor.  Denies LOC.  She had previously had problems with her left knee but recently has been feeling better after getting a cortisone injection at [facility].  The headache began 6 days ago, has been a "dull roar" with a few migraine-like peaks with pounding pain above her left eye.  She is sensitive to light and nauseated.  Has been taking tylenol without improvement.   Denies fevers, recent illness, head trauma, neck pain or stiffness, "worst" headache of life, sudden onset or "thunderclap" headache.  Denies focal neurologic deficits.   Past Medical History:  Diagnosis Date  . Anemia   . Anxiety   . Blood transfusion without reported diagnosis   . Depression   . Hemorrhoid   . Hypertension   . Migraine     Patient Active Problem List   Diagnosis Date Noted  . Left medial knee pain 06/29/2015  . Acute bacterial sinusitis 10/11/2014  . Maxillary sinusitis, acute 09/14/2014  . Gastroenteritis 11/09/2013  . Pleuritic chest pain 09/17/2013  . Mood disorder (HCC) 07/26/2013  . Chest pain 11/18/2012  . Morbid obesity (HCC) 09/29/2012  . HTN (hypertension) 07/12/2012  . Migraine 07/12/2012  . Bipolar II disorder, most recent episode major depressive (HCC) 07/11/2012    Past Surgical History:  Procedure Laterality Date  . HEMORRHOID SURGERY     Oct 2011  . HEMORRHOID SURGERY    . HERNIA REPAIR    . TONSILLECTOMY    . VENTRAL HERNIA  REPAIR      OB History    No data available       Home Medications    Prior to Admission medications   Medication Sig Start Date End Date Taking? Authorizing Provider  amLODipine (NORVASC) 2.5 MG tablet TAKE 1 TABLET BY MOUTH DAILY FOR HIGH BLOOD PRESSURE 07/12/15   Sheliah Hatch, MD  buPROPion (WELLBUTRIN XL) 300 MG 24 hr tablet Take 300 mg by mouth daily.    Historical Provider, MD  clonazePAM (KLONOPIN) 0.5 MG tablet Take 1 tablet (0.5 mg total) by mouth 3 (three) times daily as needed for anxiety. 07/30/13   Tamala Julian, PA-C  HYDROcodone-acetaminophen (NORCO/VICODIN) 5-325 MG tablet Take 1 tablet by mouth every 6 (six) hours as needed for moderate pain. 07/31/15   Sheliah Hatch, MD  meloxicam (MOBIC) 15 MG tablet Take 1 tablet (15 mg total) by mouth daily. 06/24/15   Hannah Muthersbaugh, PA-C  metoprolol succinate (TOPROL-XL) 25 MG 24 hr tablet Take 1 tablet (25 mg total) by mouth daily. 09/14/14   Sheliah Hatch, MD  QUEtiapine (SEROQUEL) 400 MG tablet Take 400 mg by mouth at bedtime.    Historical Provider, MD  zolpidem (AMBIEN) 10 MG tablet Take 10 mg by mouth at bedtime as needed for sleep.  08/08/13   Historical Provider, MD    Family History Family History  Problem  Relation Age of Onset  . Osteoarthritis Mother   . Diabetes Mother   . Hypertension Mother   . Depression Mother   . Hypertension Father   . Diabetes Father   . Asthma Son   . Deep vein thrombosis Maternal Grandmother   . Deep vein thrombosis Maternal Grandfather   . Stroke Neg Hx     Social History Social History  Substance Use Topics  . Smoking status: Former Smoker    Years: 10.00  . Smokeless tobacco: Never Used     Comment: smoked 1994-2012, up to 1 pp WEEK  . Alcohol use No     Comment: socially     Allergies   Review of patient's allergies indicates no known allergies.   Review of Systems Review of Systems  All other systems reviewed and are negative.    Physical  Exam Updated Vital Signs BP 131/95 (BP Location: Right Arm)   Pulse 96   Temp 97.3 F (36.3 C) (Oral)   Resp 18   Ht 5' (1.524 m)   Wt 133.8 kg   LMP  (LMP Unknown) Comment: pt reports irregular periods.  SpO2 96%   BMI 57.61 kg/m   Physical Exam  Constitutional: She appears well-developed and well-nourished. No distress.  HENT:  Head: Normocephalic and atraumatic.  Neck: Neck supple.  Cardiovascular: Normal rate and regular rhythm.   Pulmonary/Chest: Effort normal and breath sounds normal. No respiratory distress. She has no wheezes. She has no rales.  Abdominal: Soft. She exhibits no distension. There is no tenderness. There is no rebound and no guarding.  Morbidly obese   Musculoskeletal:  Left lower extremity: Left knee with small area of ecchymosis over the patella.  Tenderness throughout anterior knee.  Full AROM but pt more comfortable with leg extended.  Sensation and pulses intact.  No tenderness otherwise throughout left leg.   Neurological: She is alert.  Skin: She is not diaphoretic.  Nursing note and vitals reviewed.    ED Treatments / Results  Labs (all labs ordered are listed, but only abnormal results are displayed) Labs Reviewed  BASIC METABOLIC PANEL - Abnormal; Notable for the following:       Result Value   Calcium 8.8 (*)    All other components within normal limits  CBC WITH DIFFERENTIAL/PLATELET  PREGNANCY, URINE  URINALYSIS, ROUTINE W REFLEX MICROSCOPIC (NOT AT [facility])    EKG  EKG Interpretation  Date/Time:  Tuesday November 28 2015 09:39:48 EDT Ventricular Rate:  84 PR Interval:    QRS Duration: 91 QT Interval:  372 QTC Calculation: 440 R Axis:   -13 Text Interpretation:  Sinus rhythm Low voltage, precordial leads Confirmed by ZACKOWSKI  MD, SCOTT ([id number]) on 11/28/2015 9:49:24 AM       Radiology Dg Knee Complete 4 Views Left  Result Date: 11/28/2015 CLINICAL DATA:  Fall today.  Knee injury EXAM: LEFT KNEE - COMPLETE 4+ VIEW COMPARISON:   06/24/2015 FINDINGS: No evidence of fracture, dislocation, or joint effusion. No evidence of arthropathy or other focal bone abnormality. Soft tissues are unremarkable. IMPRESSION: Negative. Electronically Signed   By: Marlan Palau M.D.   On: 11/28/2015 09:36    Procedures Procedures (including critical care time)  Medications Ordered in ED Medications  sodium chloride 0.9 % bolus 1,000 mL (not administered)  diphenhydrAMINE (BENADRYL) injection 25 mg (not administered)  ketorolac (TORADOL) 30 MG/ML injection 30 mg (30 mg Intravenous Given 11/28/15 0942)  metoCLOPramide (REGLAN) injection 10 mg (10 mg Intravenous Given 11/28/15  1308)     Initial Impression / Assessment and Plan / ED Course  I have reviewed the triage vital signs and the nursing notes.  Pertinent labs & imaging results that were available during my care of the patient were reviewed by me and considered in my medical decision making (see chart for details).  Clinical Course  Value Comment By Time  Calcium: (!) 8.8 (Reviewed) Trixie Dredge, PA-C 08/01 1023    Afebrile, nontoxic patient with migraine headache x 6 days, episode of lightheadedness this morning causing her to fall on her left knee.  No LOC.  Knee xray negative.  Neurovascularly intact.  EKG nonischemic,  Labs unremarkable.  UA negative. Upregnegative.  IVF, migraine cocktail given with complete relief of headache, but continued knee pain.    D/C home with ace wrap, pt has crutches at home, PCP follow up, return to orthopedic if needed.   Discussed result, findings, treatment, and follow up  with patient.  Pt given return precautions.  Pt verbalizes understanding and agrees with plan.       Final Clinical Impressions(s) / ED Diagnoses   Final diagnoses:  Acute nonintractable headache, unspecified headache type  Contusion of left knee, initial encounter    New Prescriptions Current Discharge Medication List       Trixie Dredge, PA-C 11/28/15 1149    Vanetta Mulders, MD 12/01/15 [phone]

## 2015-11-29 ENCOUNTER — Other Ambulatory Visit: Payer: Self-pay | Admitting: Family Medicine

## 2015-11-29 ENCOUNTER — Telehealth: Payer: Self-pay | Admitting: Family Medicine

## 2015-11-29 NOTE — Telephone Encounter (Signed)
mychart message sent to pt to inform.. Pt had requested hydrocodone through there.

## 2015-11-29 NOTE — Telephone Encounter (Signed)
Please advise 

## 2015-11-29 NOTE — Telephone Encounter (Signed)
Pt says that she had a fall and went to the ED. Pt says that she was prescribed meloxicam that isn't currently working. Pt would like to know if she could receive a few Hydrocodone for the pain?     Please advise and assist further.    CB: [phone]

## 2015-11-29 NOTE — Telephone Encounter (Signed)
Unfortunately I do not give hydrocodone for this.  I agree w/ the meloxicam and she can add tylenol if needed

## 2015-12-01 ENCOUNTER — Encounter (HOSPITAL_BASED_OUTPATIENT_CLINIC_OR_DEPARTMENT_OTHER): Payer: Self-pay | Admitting: Emergency Medicine

## 2015-12-01 ENCOUNTER — Emergency Department (HOSPITAL_BASED_OUTPATIENT_CLINIC_OR_DEPARTMENT_OTHER)
Admission: EM | Admit: 2015-12-01 | Discharge: 2015-12-01 | Disposition: A | Discharge status: Home / Self Care | Payer: Medicaid Other | Attending: Emergency Medicine | Admitting: Emergency Medicine

## 2015-12-01 ENCOUNTER — Encounter: Payer: Self-pay | Admitting: Family Medicine

## 2015-12-01 DIAGNOSIS — I10 Essential (primary) hypertension: Secondary | ICD-10-CM | POA: Insufficient documentation

## 2015-12-01 DIAGNOSIS — M25562 Pain in left knee: Secondary | ICD-10-CM | POA: Insufficient documentation

## 2015-12-01 DIAGNOSIS — Z79899 Other long term (current) drug therapy: Secondary | ICD-10-CM | POA: Diagnosis not present

## 2015-12-01 DIAGNOSIS — Z87891 Personal history of nicotine dependence: Secondary | ICD-10-CM | POA: Diagnosis not present

## 2015-12-01 MED ORDER — TRAMADOL HCL 50 MG PO TABS: 50.0000 mg | ORAL_TABLET | Freq: Four times a day (QID) | ORAL | 0 refills | Status: DC | PRN

## 2015-12-01 MED FILL — traMADol HCL 50 MG TABS: 50 | 3 days supply | Qty: 15 | Fill #0

## 2015-12-01 NOTE — ED Notes (Signed)
Pt made aware to return if symptoms worsen or if any life threatening symptoms occur.  Pt made aware not to drive or go to work while taking narcotic pain medication.    

## 2015-12-01 NOTE — ED Triage Notes (Signed)
Pt reports falling 3 days ago after passing out from migraine, was seen in ED, given crutches and ace wrap for left knee pain.  Pt taking mobic, without relief.  Pt had ace wrap applied upon arrival.  Bruising noted to knee.

## 2015-12-01 NOTE — ED Provider Notes (Signed)
[facility]   CSN: [id number] Arrival date & time: 12/01/15  0940  First Provider Contact:  None       History   Chief Complaint Chief Complaint  Patient presents with  . Knee Pain    HPI Marilyn Fowler is a 40 y.o. female.  Patient presents today with left knee pain.  Pain has been present since falling and landing on her knee three days ago.  She was seen in the ED after the fall and had an xray done, which was negative.  She was then given a Rx for Mobic, an ACE wrap, and crutches.  She states that she has been taking the Mobic, but does not feel that it is helping with the pain.  She has been using the crutches some, but reports that it is difficult to use the cructhes due to the fact that she weighs 300 lbs.  She has also been icing the knee.  Pain worse with bearing weight, palpation, and movement of the knee.  She had a Migraine at her last ED visit three days ago, but states that she has not had a headache at all since her last visit.  Denies numbness, tingling, or any other symptoms at this time.       Past Medical History:  Diagnosis Date  . Anemia   . Anxiety   . Blood transfusion without reported diagnosis   . Depression   . Hemorrhoid   . Hypertension   . Migraine     Patient Active Problem List   Diagnosis Date Noted  . Left medial knee pain 06/29/2015  . Acute bacterial sinusitis 10/11/2014  . Maxillary sinusitis, acute 09/14/2014  . Gastroenteritis 11/09/2013  . Pleuritic chest pain 09/17/2013  . Mood disorder (HCC) 07/26/2013  . Chest pain 11/18/2012  . Morbid obesity (HCC) 09/29/2012  . HTN (hypertension) 07/12/2012  . Migraine 07/12/2012  . Bipolar II disorder, most recent episode major depressive (HCC) 07/11/2012    Past Surgical History:  Procedure Laterality Date  . HEMORRHOID SURGERY     Oct 2011  . HEMORRHOID SURGERY    . HERNIA REPAIR    . TONSILLECTOMY    . VENTRAL HERNIA REPAIR      OB History    No  data available       Home Medications    Prior to Admission medications   Medication Sig Start Date End Date Taking? Authorizing Provider  amLODipine (NORVASC) 2.5 MG tablet TAKE 1 TABLET BY MOUTH DAILY FOR HIGH BLOOD PRESSURE 07/12/15   Sheliah Hatch, MD  buPROPion (WELLBUTRIN XL) 300 MG 24 hr tablet Take 300 mg by mouth daily.    Historical Provider, MD  clonazePAM (KLONOPIN) 0.5 MG tablet Take 1 tablet (0.5 mg total) by mouth 3 (three) times daily as needed for anxiety. 07/30/13   Tamala Julian, PA-C  HYDROcodone-acetaminophen (NORCO/VICODIN) 5-325 MG tablet Take 1 tablet by mouth every 6 (six) hours as needed for moderate pain. 07/31/15   Sheliah Hatch, MD  meloxicam (MOBIC) 7.5 MG tablet Take 1-2 tablets (7.5-15 mg total) by mouth daily as needed for pain. 11/28/15   Trixie Dredge, PA-C  metoprolol succinate (TOPROL-XL) 25 MG 24 hr tablet Take 1 tablet (25 mg total) by mouth daily. 09/14/14   Sheliah Hatch, MD  QUEtiapine (SEROQUEL) 400 MG tablet Take 400 mg by mouth at bedtime.    Historical Provider, MD  zolpidem (AMBIEN) 10 MG tablet Take 10 mg by  mouth at bedtime as needed for sleep.  08/08/13   Historical Provider, MD    Family History Family History  Problem Relation Age of Onset  . Osteoarthritis Mother   . Diabetes Mother   . Hypertension Mother   . Depression Mother   . Hypertension Father   . Diabetes Father   . Asthma Son   . Deep vein thrombosis Maternal Grandmother   . Deep vein thrombosis Maternal Grandfather   . Stroke Neg Hx     Social History Social History  Substance Use Topics  . Smoking status: Former Smoker    Years: 10.00  . Smokeless tobacco: Never Used     Comment: smoked 1994-2012, up to 1 pp WEEK  . Alcohol use No     Comment: socially     Allergies   Toradol [ketorolac tromethamine]   Review of Systems Review of Systems  All other systems reviewed and are negative.    Physical Exam Updated Vital Signs BP 126/94 (BP  Location: Right Arm)   Pulse 97   Temp 98.4 F (36.9 C) (Oral)   Resp 16   LMP  (LMP Unknown) Comment: pt reports irregular periods.  SpO2 98%   Physical Exam  Constitutional: She appears well-developed and well-nourished.  HENT:  Head: Normocephalic and atraumatic.  Neck: Normal range of motion.  Cardiovascular: Normal rate and regular rhythm.   Pulses:      Dorsalis pedis pulses are 2+ on the right side, and 2+ on the left side.  Pulmonary/Chest: Effort normal and breath sounds normal.  Musculoskeletal:  Ecchymosis over the left patella.  Mild diffuse swelling of the left knee.  No erythema or warmth of the left knee.  Pain with ROM of the left knee  Neurological: She is alert.  Distal sensation of the left foot intact  Skin: Skin is warm and dry. No erythema.  Psychiatric: She has a normal mood and affect.  Nursing note and vitals reviewed.    ED Treatments / Results  Labs (all labs ordered are listed, but only abnormal results are displayed) Labs Reviewed - No data to display  EKG  EKG Interpretation None       Radiology No results found.  Procedures Procedures (including critical care time)  Medications Ordered in ED Medications - No data to display   Initial Impression / Assessment and Plan / ED Course  I have reviewed the triage vital signs and the nursing notes.  Pertinent labs & imaging results that were available during my care of the patient were reviewed by me and considered in my medical decision making (see chart for details).  Clinical Course   Patient presents today with left knee pain that has been present since a fall that occurred 3 days ago.  She was seen in the ED after the fall and had a negative xray.  She was given ACE wrap, crutches, and Rx for Mobic.  She does not feel that the Mobic is helping with the pain.  Patient given Rx for short course of Tramadol, but instructed to continue taking NSAIDS and RICE.  Stable for discharge.   Return precautions given.  Final Clinical Impressions(s) / ED Diagnoses   Final diagnoses:  None    New Prescriptions New Prescriptions   No medications on file     Santiago Glad, PA-C 12/01/15 1030    Gwyneth Sprout, MD 12/01/15 1118

## 2015-12-01 NOTE — ED Notes (Signed)
PA at bedside.

## 2015-12-04 NOTE — ED Notes (Addendum)
Pt called on 12/04/15 at 1000 requesting to return to work on 8/9 rather than 8/14 as indicated on her work note. Chart reviewed by Dr. Judd Lienelo and new note issued per [facility]. Advised pt she could pick note up at registration window

## 2016-02-23 ENCOUNTER — Ambulatory Visit: Payer: Self-pay | Admitting: Family Medicine

## 2016-03-01 ENCOUNTER — Ambulatory Visit: Payer: Self-pay | Admitting: Family Medicine

## 2016-03-01 DIAGNOSIS — Z0289 Encounter for other administrative examinations: Secondary | ICD-10-CM

## 2016-03-27 ENCOUNTER — Ambulatory Visit: Payer: Self-pay | Admitting: Family Medicine

## 2016-03-27 DIAGNOSIS — Z0289 Encounter for other administrative examinations: Secondary | ICD-10-CM

## 2016-04-29 ENCOUNTER — Emergency Department (HOSPITAL_BASED_OUTPATIENT_CLINIC_OR_DEPARTMENT_OTHER): Payer: Medicaid Other

## 2016-04-29 ENCOUNTER — Inpatient Hospital Stay (HOSPITAL_BASED_OUTPATIENT_CLINIC_OR_DEPARTMENT_OTHER)
Admission: EM | Admit: 2016-04-29 | Discharge: 2016-04-30 | DRG: 433 | Disposition: A | Discharge status: Home / Self Care | Payer: Medicaid Other | Attending: Internal Medicine | Admitting: Internal Medicine

## 2016-04-29 ENCOUNTER — Encounter (HOSPITAL_BASED_OUTPATIENT_CLINIC_OR_DEPARTMENT_OTHER): Payer: Self-pay | Admitting: Emergency Medicine

## 2016-04-29 DIAGNOSIS — E872 Acidosis, unspecified: Secondary | ICD-10-CM

## 2016-04-29 DIAGNOSIS — E785 Hyperlipidemia, unspecified: Secondary | ICD-10-CM | POA: Diagnosis present

## 2016-04-29 DIAGNOSIS — Z833 Family history of diabetes mellitus: Secondary | ICD-10-CM | POA: Diagnosis not present

## 2016-04-29 DIAGNOSIS — K700 Alcoholic fatty liver: Secondary | ICD-10-CM | POA: Diagnosis present

## 2016-04-29 DIAGNOSIS — Z87891 Personal history of nicotine dependence: Secondary | ICD-10-CM

## 2016-04-29 DIAGNOSIS — R1013 Epigastric pain: Secondary | ICD-10-CM | POA: Diagnosis not present

## 2016-04-29 DIAGNOSIS — F39 Unspecified mood [affective] disorder: Secondary | ICD-10-CM | POA: Diagnosis present

## 2016-04-29 DIAGNOSIS — F329 Major depressive disorder, single episode, unspecified: Secondary | ICD-10-CM | POA: Diagnosis present

## 2016-04-29 DIAGNOSIS — Z8249 Family history of ischemic heart disease and other diseases of the circulatory system: Secondary | ICD-10-CM | POA: Diagnosis not present

## 2016-04-29 DIAGNOSIS — E860 Dehydration: Secondary | ICD-10-CM

## 2016-04-29 DIAGNOSIS — R112 Nausea with vomiting, unspecified: Secondary | ICD-10-CM | POA: Diagnosis not present

## 2016-04-29 DIAGNOSIS — R1011 Right upper quadrant pain: Secondary | ICD-10-CM

## 2016-04-29 DIAGNOSIS — F10930 Alcohol use, unspecified with withdrawal, uncomplicated: Secondary | ICD-10-CM

## 2016-04-29 DIAGNOSIS — F1010 Alcohol abuse, uncomplicated: Secondary | ICD-10-CM | POA: Diagnosis present

## 2016-04-29 DIAGNOSIS — F419 Anxiety disorder, unspecified: Secondary | ICD-10-CM | POA: Diagnosis present

## 2016-04-29 DIAGNOSIS — Z6841 Body Mass Index (BMI) 40.0 and over, adult: Secondary | ICD-10-CM | POA: Diagnosis not present

## 2016-04-29 DIAGNOSIS — K7010 Alcoholic hepatitis without ascites: Secondary | ICD-10-CM | POA: Diagnosis present

## 2016-04-29 DIAGNOSIS — I10 Essential (primary) hypertension: Secondary | ICD-10-CM | POA: Diagnosis present

## 2016-04-29 DIAGNOSIS — Z825 Family history of asthma and other chronic lower respiratory diseases: Secondary | ICD-10-CM

## 2016-04-29 DIAGNOSIS — F10230 Alcohol dependence with withdrawal, uncomplicated: Secondary | ICD-10-CM | POA: Diagnosis present

## 2016-04-29 DIAGNOSIS — E876 Hypokalemia: Secondary | ICD-10-CM | POA: Diagnosis present

## 2016-04-29 DIAGNOSIS — Z23 Encounter for immunization: Secondary | ICD-10-CM

## 2016-04-29 DIAGNOSIS — F10939 Alcohol use, unspecified with withdrawal, unspecified: Secondary | ICD-10-CM | POA: Diagnosis present

## 2016-04-29 DIAGNOSIS — F10239 Alcohol dependence with withdrawal, unspecified: Secondary | ICD-10-CM | POA: Diagnosis present

## 2016-04-29 DIAGNOSIS — R109 Unspecified abdominal pain: Secondary | ICD-10-CM

## 2016-04-29 HISTORY — DX: Alcohol abuse, uncomplicated: F10.10

## 2016-04-29 LAB — CBC WITH DIFFERENTIAL/PLATELET
BASOS ABS: 0 10*3/uL (ref 0.0–0.1)
BASOS PCT: 0 %
EOS PCT: 0 %
Eosinophils Absolute: 0 10*3/uL (ref 0.0–0.7)
HEMATOCRIT: 42.8 % (ref 36.0–46.0)
Hemoglobin: 14.5 g/dL (ref 12.0–15.0)
LYMPHS PCT: 21 %
Lymphs Abs: 2.3 10*3/uL (ref 0.7–4.0)
MCH: 29.2 pg (ref 26.0–34.0)
MCHC: 33.9 g/dL (ref 30.0–36.0)
MCV: 86.3 fL (ref 78.0–100.0)
MONO ABS: 1.1 10*3/uL — AB (ref 0.1–1.0)
Monocytes Relative: 10 %
NEUTROS ABS: 7.5 10*3/uL (ref 1.7–7.7)
Neutrophils Relative %: 69 %
PLATELETS: 367 10*3/uL (ref 150–400)
RBC: 4.96 MIL/uL (ref 3.87–5.11)
RDW: 16 % — AB (ref 11.5–15.5)
WBC: 10.9 10*3/uL — AB (ref 4.0–10.5)

## 2016-04-29 LAB — COMPREHENSIVE METABOLIC PANEL
ALBUMIN: 4.7 g/dL (ref 3.5–5.0)
ALT: 60 U/L — ABNORMAL HIGH (ref 14–54)
AST: 90 U/L — AB (ref 15–41)
Alkaline Phosphatase: 124 U/L (ref 38–126)
Anion gap: 21 — ABNORMAL HIGH (ref 5–15)
BUN: 6 mg/dL (ref 6–20)
CHLORIDE: 102 mmol/L (ref 101–111)
CO2: 14 mmol/L — ABNORMAL LOW (ref 22–32)
CREATININE: 0.81 mg/dL (ref 0.44–1.00)
Calcium: 9.5 mg/dL (ref 8.9–10.3)
GFR calc Af Amer: >60 mL/min (ref >60)
GFR calc non Af Amer: >60 mL/min (ref >60)
GLUCOSE: 121 mg/dL — AB (ref 65–99)
POTASSIUM: 4 mmol/L (ref 3.5–5.1)
Sodium: 137 mmol/L (ref 135–145)
Total Bilirubin: 0.8 mg/dL (ref 0.3–1.2)
Total Protein: 8.1 g/dL (ref 6.5–8.1)

## 2016-04-29 LAB — CBC
HCT: 34.7 % — ABNORMAL LOW (ref 36.0–46.0)
Hemoglobin: 11.7 g/dL — ABNORMAL LOW (ref 12.0–15.0)
MCH: 29.2 pg (ref 26.0–34.0)
MCHC: 33.7 g/dL (ref 30.0–36.0)
MCV: 86.5 fL (ref 78.0–100.0)
PLATELETS: 250 10*3/uL (ref 150–400)
RBC: 4.01 MIL/uL (ref 3.87–5.11)
RDW: 15.9 % — ABNORMAL HIGH (ref 11.5–15.5)
WBC: 9.6 10*3/uL (ref 4.0–10.5)

## 2016-04-29 LAB — RAPID URINE DRUG SCREEN, HOSP PERFORMED
AMPHETAMINES: NOT DETECTED
BARBITURATES: NOT DETECTED
BENZODIAZEPINES: POSITIVE — AB
COCAINE: NOT DETECTED
Opiates: NOT DETECTED
TETRAHYDROCANNABINOL: NOT DETECTED

## 2016-04-29 LAB — URINALYSIS, MICROSCOPIC (REFLEX)

## 2016-04-29 LAB — I-STAT VENOUS BLOOD GAS, ED
Acid-base deficit: 9 mmol/L — ABNORMAL HIGH (ref 0.0–2.0)
Bicarbonate: 15.5 mmol/L — ABNORMAL LOW (ref 20.0–28.0)
O2 SAT: 73 %
PCO2 VEN: 29 mmHg — AB (ref 44.0–60.0)
PH VEN: 7.336 (ref 7.250–7.430)
Patient temperature: 99
TCO2: 16 mmol/L (ref 0–100)
pO2, Ven: 40 mmHg (ref 32.0–45.0)

## 2016-04-29 LAB — CREATININE, SERUM
CREATININE: 0.99 mg/dL (ref 0.44–1.00)
GFR calc Af Amer: >60 mL/min (ref >60)
GFR calc non Af Amer: >60 mL/min (ref >60)

## 2016-04-29 LAB — LACTIC ACID, PLASMA
LACTIC ACID, VENOUS: 2.3 mmol/L — AB (ref 0.5–1.9)
LACTIC ACID, VENOUS: 3.2 mmol/L — AB (ref 0.5–1.9)

## 2016-04-29 LAB — URINALYSIS, ROUTINE W REFLEX MICROSCOPIC
Bilirubin Urine: NEGATIVE
GLUCOSE, UA: NEGATIVE mg/dL
Hgb urine dipstick: NEGATIVE
Ketones, ur: 40 mg/dL — AB
LEUKOCYTES UA: NEGATIVE
Nitrite: NEGATIVE
PH: 5.5 (ref 5.0–8.0)
Protein, ur: 100 mg/dL — AB
SPECIFIC GRAVITY, URINE: 1.025 (ref 1.005–1.030)

## 2016-04-29 LAB — PROCALCITONIN: Procalcitonin: <0.1 ng/mL

## 2016-04-29 LAB — I-STAT CG4 LACTIC ACID, ED: Lactic Acid, Venous: 5.16 mmol/L (ref 0.5–1.9)

## 2016-04-29 LAB — LIPASE, BLOOD: Lipase: 47 U/L (ref 11–51)

## 2016-04-29 LAB — HCG, SERUM, QUALITATIVE: Preg, Serum: NEGATIVE

## 2016-04-29 LAB — PROTIME-INR
INR: 0.97
PROTHROMBIN TIME: 12.9 s (ref 11.4–15.2)

## 2016-04-29 LAB — MRSA PCR SCREENING: MRSA by PCR: NEGATIVE

## 2016-04-29 MED ORDER — SODIUM CHLORIDE 0.9 % IV BOLUS (SEPSIS): 1000.0000 mL | Freq: Once | INTRAVENOUS | Status: DC

## 2016-04-29 MED ORDER — SODIUM CHLORIDE 0.9 % IV BOLUS (SEPSIS)
500.0000 mL | Freq: Once | INTRAVENOUS | Status: AC
  Administered 2016-04-29: 500 mL via INTRAVENOUS

## 2016-04-29 MED ORDER — LORAZEPAM 2 MG/ML IJ SOLN: 2.0000 mg | Freq: Once | INTRAMUSCULAR | Status: DC

## 2016-04-29 MED ORDER — VITAMIN B-1 100 MG PO TABS
100.0000 mg | ORAL_TABLET | Freq: Every day | ORAL | Status: DC
  Administered 2016-04-29 – 2016-04-30 (×2): 100 mg via ORAL
  Filled 2016-04-29 (×2): qty 1

## 2016-04-29 MED ORDER — METOPROLOL SUCCINATE ER 25 MG PO TB24
25.0000 mg | ORAL_TABLET | Freq: Every day | ORAL | Status: DC
  Administered 2016-04-30: 25 mg via ORAL
  Filled 2016-04-29: qty 1

## 2016-04-29 MED ORDER — ZOLPIDEM TARTRATE 5 MG PO TABS
5.0000 mg | ORAL_TABLET | Freq: Every evening | ORAL | Status: DC | PRN
  Administered 2016-04-29: 5 mg via ORAL
  Filled 2016-04-29: qty 1

## 2016-04-29 MED ORDER — THIAMINE HCL 100 MG/ML IJ SOLN
100.0000 mg | Freq: Once | INTRAMUSCULAR | Status: AC
  Administered 2016-04-29: 100 mg via INTRAVENOUS
  Filled 2016-04-29: qty 2

## 2016-04-29 MED ORDER — LORAZEPAM 2 MG/ML IJ SOLN
1.0000 mg | Freq: Once | INTRAMUSCULAR | Status: AC
  Administered 2016-04-29: 1 mg via INTRAVENOUS
  Filled 2016-04-29: qty 1

## 2016-04-29 MED ORDER — METOCLOPRAMIDE HCL 5 MG/ML IJ SOLN
10.0000 mg | Freq: Once | INTRAMUSCULAR | Status: AC
Start: 2016-04-29 — End: 2016-04-29
  Administered 2016-04-29: 10 mg via INTRAVENOUS
  Filled 2016-04-29: qty 2

## 2016-04-29 MED ORDER — VANCOMYCIN HCL IN DEXTROSE 1-5 GM/200ML-% IV SOLN: 1000.0000 mg | Freq: Once | INTRAVENOUS | Status: DC

## 2016-04-29 MED ORDER — PIPERACILLIN-TAZOBACTAM 3.375 G IVPB 30 MIN
3.3750 g | Freq: Once | INTRAVENOUS | Status: AC
  Administered 2016-04-29: 3.375 g via INTRAVENOUS
  Filled 2016-04-29 (×2): qty 50

## 2016-04-29 MED ORDER — VANCOMYCIN HCL 500 MG IV SOLR
INTRAVENOUS | Status: AC
  Filled 2016-04-29: qty 2500

## 2016-04-29 MED ORDER — POTASSIUM CHLORIDE 2 MEQ/ML IV SOLN
INTRAVENOUS | Status: DC
  Administered 2016-04-29: 18:00:00 via INTRAVENOUS
  Filled 2016-04-29 (×3): qty 1000

## 2016-04-29 MED ORDER — VANCOMYCIN HCL 10 G IV SOLR
2500.0000 mg | Freq: Once | INTRAVENOUS | Status: AC
  Administered 2016-04-29: 2500 mg via INTRAVENOUS
  Filled 2016-04-29: qty 2500

## 2016-04-29 MED ORDER — QUETIAPINE FUMARATE 400 MG PO TABS
400.0000 mg | ORAL_TABLET | Freq: Every day | ORAL | Status: DC
  Administered 2016-04-29: 400 mg via ORAL
  Filled 2016-04-29 (×2): qty 1

## 2016-04-29 MED ORDER — IOPAMIDOL (ISOVUE-300) INJECTION 61%
100.0000 mL | Freq: Once | INTRAVENOUS | Status: AC | PRN
  Administered 2016-04-29: 100 mL via INTRAVENOUS

## 2016-04-29 MED ORDER — PIPERACILLIN-TAZOBACTAM 3.375 G IVPB
3.3750 g | Freq: Three times a day (TID) | INTRAVENOUS | Status: DC
  Filled 2016-04-29: qty 50

## 2016-04-29 MED ORDER — INFLUENZA VAC SPLIT QUAD 0.5 ML IM SUSY
0.5000 mL | PREFILLED_SYRINGE | INTRAMUSCULAR | Status: AC
  Administered 2016-04-30: 0.5 mL via INTRAMUSCULAR
  Filled 2016-04-29: qty 0.5

## 2016-04-29 MED ORDER — LORAZEPAM 2 MG/ML IJ SOLN: 0.0000 mg | Freq: Two times a day (BID) | INTRAMUSCULAR | Status: DC

## 2016-04-29 MED ORDER — FOLIC ACID 1 MG PO TABS
1.0000 mg | ORAL_TABLET | Freq: Every day | ORAL | Status: DC
  Administered 2016-04-29 – 2016-04-30 (×2): 1 mg via ORAL
  Filled 2016-04-29 (×2): qty 1

## 2016-04-29 MED ORDER — SODIUM CHLORIDE 0.9 % IV BOLUS (SEPSIS)
1000.0000 mL | Freq: Once | INTRAVENOUS | Status: AC
  Administered 2016-04-29: 1000 mL via INTRAVENOUS

## 2016-04-29 MED ORDER — ONDANSETRON HCL 4 MG/2ML IJ SOLN
4.0000 mg | Freq: Once | INTRAMUSCULAR | Status: AC
  Administered 2016-04-29: 4 mg via INTRAVENOUS
  Filled 2016-04-29: qty 2

## 2016-04-29 MED ORDER — DIPHENHYDRAMINE HCL 50 MG/ML IJ SOLN
25.0000 mg | Freq: Once | INTRAMUSCULAR | Status: AC
  Administered 2016-04-29: 25 mg via INTRAVENOUS
  Filled 2016-04-29: qty 1

## 2016-04-29 MED ORDER — PANTOPRAZOLE SODIUM 40 MG PO TBEC
40.0000 mg | DELAYED_RELEASE_TABLET | Freq: Every day | ORAL | Status: DC
  Administered 2016-04-30: 40 mg via ORAL
  Filled 2016-04-29 (×2): qty 1

## 2016-04-29 MED ORDER — LORAZEPAM 2 MG/ML IJ SOLN
0.0000 mg | Freq: Four times a day (QID) | INTRAMUSCULAR | Status: DC
  Administered 2016-04-29 – 2016-04-30 (×3): 2 mg via INTRAVENOUS
  Filled 2016-04-29 (×3): qty 1

## 2016-04-29 MED ORDER — METOPROLOL TARTRATE 50 MG PO TABS
25.0000 mg | ORAL_TABLET | Freq: Once | ORAL | Status: AC
  Administered 2016-04-29: 25 mg via ORAL
  Filled 2016-04-29: qty 1

## 2016-04-29 MED ORDER — TRAMADOL HCL 50 MG PO TABS
50.0000 mg | ORAL_TABLET | Freq: Four times a day (QID) | ORAL | Status: DC | PRN
Start: 2016-04-29 — End: 2016-04-30
  Administered 2016-04-30: 50 mg via ORAL
  Filled 2016-04-29: qty 1

## 2016-04-29 MED ORDER — THIAMINE HCL 100 MG/ML IJ SOLN: 100.0000 mg | Freq: Every day | INTRAMUSCULAR | Status: DC

## 2016-04-29 MED ORDER — LORAZEPAM 2 MG/ML IJ SOLN: 1.0000 mg | Freq: Four times a day (QID) | INTRAMUSCULAR | Status: DC | PRN

## 2016-04-29 MED ORDER — LACTATED RINGERS IV BOLUS (SEPSIS)
1000.0000 mL | Freq: Once | INTRAVENOUS | Status: AC
  Administered 2016-04-29: 1000 mL via INTRAVENOUS

## 2016-04-29 MED ORDER — VANCOMYCIN HCL IN DEXTROSE 1-5 GM/200ML-% IV SOLN
1000.0000 mg | Freq: Three times a day (TID) | INTRAVENOUS | Status: DC
  Filled 2016-04-29: qty 200

## 2016-04-29 MED ORDER — BUPROPION HCL ER (XL) 150 MG PO TB24
300.0000 mg | ORAL_TABLET | Freq: Every day | ORAL | Status: DC
  Administered 2016-04-30: 300 mg via ORAL
  Filled 2016-04-29: qty 2

## 2016-04-29 MED ORDER — SODIUM CHLORIDE 0.9% FLUSH
3.0000 mL | Freq: Two times a day (BID) | INTRAVENOUS | Status: DC
  Administered 2016-04-29: 3 mL via INTRAVENOUS

## 2016-04-29 MED ORDER — LORAZEPAM 1 MG PO TABS
1.0000 mg | ORAL_TABLET | Freq: Four times a day (QID) | ORAL | Status: DC | PRN
  Administered 2016-04-29: 1 mg via ORAL
  Filled 2016-04-29: qty 1

## 2016-04-29 MED ORDER — ENOXAPARIN SODIUM 40 MG/0.4ML ~~LOC~~ SOLN
40.0000 mg | SUBCUTANEOUS | Status: DC
  Administered 2016-04-29: 40 mg via SUBCUTANEOUS
  Filled 2016-04-29: qty 0.4

## 2016-04-29 MED ORDER — ONDANSETRON HCL 4 MG/2ML IJ SOLN
4.0000 mg | Freq: Four times a day (QID) | INTRAMUSCULAR | Status: DC | PRN
  Administered 2016-04-29 – 2016-04-30 (×2): 4 mg via INTRAVENOUS
  Filled 2016-04-29 (×2): qty 2

## 2016-04-29 MED ORDER — ADULT MULTIVITAMIN W/MINERALS CH
1.0000 | ORAL_TABLET | Freq: Every day | ORAL | Status: DC
  Administered 2016-04-29 – 2016-04-30 (×2): 1 via ORAL
  Filled 2016-04-29 (×2): qty 1

## 2016-04-29 MED ORDER — AMLODIPINE BESYLATE 5 MG PO TABS
2.5000 mg | ORAL_TABLET | Freq: Once | ORAL | Status: AC
  Administered 2016-04-29: 2.5 mg via ORAL
  Filled 2016-04-29: qty 1

## 2016-04-29 MED ORDER — SODIUM BICARBONATE 8.4 % IV SOLN
INTRAVENOUS | Status: AC
  Filled 2016-04-29: qty 50

## 2016-04-29 MED ORDER — SODIUM BICARBONATE 8.4 % IV SOLN
100.0000 meq | Freq: Once | INTRAVENOUS | Status: AC
  Administered 2016-04-29: 100 meq via INTRAVENOUS
  Filled 2016-04-29: qty 50

## 2016-04-29 NOTE — ED Triage Notes (Signed)
Pt c/o vomiting x 48 hrs; sts she is trying to stop drinking ETOH, but vomiting persists; vomiting started before she stopped drinking, however.

## 2016-04-29 NOTE — ED Provider Notes (Signed)
[facility] Provider Note   CSN: [id number] Arrival date & time: 04/29/16  [id number]     History   Chief Complaint Chief Complaint  Patient presents with  . Emesis    HPI Marilyn Fowler is a 41 y.o. female.  HPI 41 year old female with history of hypertension, hyperlipidemia, morbid obesity, and recent heavy alcohol use who presents with epigastric pain, nausea, and vomiting. Patient states that since she lost her job 2 months ago, she has been drinking multiple pints of vodka daily. Over the last several days, she has begun to try to taper down her alcohol use but continues to drink every 3-4 hours. Over the last 48 hours, she is developed progressively worsening aching, gnawing, crampy-like abdominal pain that is now severe. She's had associated nausea and nonbloody, nonbilious emesis. Her emesis has been uncontrollable and she has not been able to eat or drink for the last 48 hours. She endorses associated mild abdominal cramping. No diarrhea. No fevers. No history of pancreatitis or similar issues.  Past Medical History:  Diagnosis Date  . Alcohol abuse   . Anemia   . Anxiety   . Blood transfusion without reported diagnosis   . Depression   . Hemorrhoid   . Hypertension   . Migraine     Patient Active Problem List   Diagnosis Date Noted  . Alcohol withdrawal (HCC) 04/29/2016  . Left medial knee pain 06/29/2015  . Acute bacterial sinusitis 10/11/2014  . Maxillary sinusitis, acute 09/14/2014  . Gastroenteritis 11/09/2013  . Pleuritic chest pain 09/17/2013  . Mood disorder (HCC) 07/26/2013  . Chest pain 11/18/2012  . Morbid obesity (HCC) 09/29/2012  . HTN (hypertension) 07/12/2012  . Migraine 07/12/2012  . Bipolar II disorder, most recent episode major depressive (HCC) 07/11/2012    Past Surgical History:  Procedure Laterality Date  . HEMORRHOID SURGERY     Oct 2011  . HEMORRHOID SURGERY    . HERNIA REPAIR    . TONSILLECTOMY    . VENTRAL HERNIA REPAIR        OB History    No data available       Home Medications    Prior to Admission medications   Medication Sig Start Date End Date Taking? Authorizing Provider  amLODipine (NORVASC) 2.5 MG tablet TAKE 1 TABLET BY MOUTH DAILY FOR HIGH BLOOD PRESSURE 07/12/15   Sheliah HatchKatherine E Tabori, MD  buPROPion (WELLBUTRIN XL) 300 MG 24 hr tablet Take 300 mg by mouth daily.    Historical Provider, MD  clonazePAM (KLONOPIN) 0.5 MG tablet Take 1 tablet (0.5 mg total) by mouth 3 (three) times daily as needed for anxiety. 07/30/13   Tamala JulianNeil T Mashburn, PA-C  HYDROcodone-acetaminophen (NORCO/VICODIN) 5-325 MG tablet Take 1 tablet by mouth every 6 (six) hours as needed for moderate pain. 07/31/15   Sheliah HatchKatherine E Tabori, MD  meloxicam (MOBIC) 7.5 MG tablet Take 1-2 tablets (7.5-15 mg total) by mouth daily as needed for pain. 11/28/15   Trixie DredgeEmily West, PA-C  metoprolol succinate (TOPROL-XL) 25 MG 24 hr tablet Take 1 tablet (25 mg total) by mouth daily. 09/14/14   Sheliah HatchKatherine E Tabori, MD  QUEtiapine (SEROQUEL) 400 MG tablet Take 400 mg by mouth at bedtime.    Historical Provider, MD  traMADol (ULTRAM) 50 MG tablet Take 1 tablet (50 mg total) by mouth every 6 (six) hours as needed. 12/01/15   Heather Laisure, PA-C  zolpidem (AMBIEN) 10 MG tablet Take 10 mg by mouth at bedtime as needed for sleep.  08/08/13   Historical Provider, MD    Family History Family History  Problem Relation Age of Onset  . Osteoarthritis Mother   . Diabetes Mother   . Hypertension Mother   . Depression Mother   . Hypertension Father   . Diabetes Father   . Asthma Son   . Deep vein thrombosis Maternal Grandmother   . Deep vein thrombosis Maternal Grandfather   . Stroke Neg Hx     Social History Social History  Substance Use Topics  . Smoking status: Former Smoker    Years: 10.00  . Smokeless tobacco: Never Used     Comment: smoked 1994-2012, up to 1 pp WEEK  . Alcohol use Yes     Comment: started drinking Vodka 2.5 mos ago when she lost job      Allergies   Toradol [ketorolac tromethamine]   Review of Systems Review of Systems  Constitutional: Positive for fatigue. Negative for chills and fever.  HENT: Negative for congestion and rhinorrhea.   Eyes: Negative for visual disturbance.  Respiratory: Negative for cough, shortness of breath and wheezing.   Cardiovascular: Negative for chest pain and leg swelling.  Gastrointestinal: Positive for abdominal pain, nausea and vomiting. Negative for diarrhea.  Genitourinary: Negative for dysuria and flank pain.  Musculoskeletal: Negative for neck pain and neck stiffness.  Skin: Negative for rash and wound.  Allergic/Immunologic: Negative for immunocompromised state.  Neurological: Positive for weakness. Negative for syncope and headaches.  All other systems reviewed and are negative.    Physical Exam Updated Vital Signs BP (!) 129/95   Pulse (!) 109   Temp 99.4 F (37.4 C) (Oral)   Resp 22   Ht 5' (1.524 m)   Wt (!) 301 lb (136.5 kg)   LMP  (LMP Unknown) Comment: Negative U-Preg today  SpO2 96%   BMI 58.79 kg/m   Physical Exam  Constitutional: She is oriented to person, place, and time. She appears well-developed and well-nourished. No distress.  HENT:  Head: Normocephalic and atraumatic.  Markedly dry mucous membranes  Eyes: Conjunctivae are normal.  Neck: Neck supple.  Cardiovascular: Normal rate, regular rhythm and normal heart sounds.  Exam reveals no friction rub.   No murmur heard. Pulmonary/Chest: Effort normal and breath sounds normal. No respiratory distress. She has no wheezes. She has no rales.  Abdominal: Soft. She exhibits no distension. There is tenderness (Generalized, worse in epigastric and right upper quadrant area). There is guarding. There is no rebound.  Musculoskeletal: She exhibits no edema.  Neurological: She is alert and oriented to person, place, and time. She exhibits normal muscle tone.  Skin: Skin is warm. Capillary refill takes less  than 2 seconds.  Psychiatric: She has a normal mood and affect.  Nursing note and vitals reviewed.    ED Treatments / Results  Labs (all labs ordered are listed, but only abnormal results are displayed) Labs Reviewed  CBC WITH DIFFERENTIAL/PLATELET - Abnormal; Notable for the following:       Result Value   WBC 10.9 (*)    RDW 16.0 (*)    Monocytes Absolute 1.1 (*)    All other components within normal limits  COMPREHENSIVE METABOLIC PANEL - Abnormal; Notable for the following:    CO2 14 (*)    Glucose, Bld 121 (*)    AST 90 (*)    ALT 60 (*)    Anion gap 21 (*)    All other components within normal limits  URINALYSIS, ROUTINE W REFLEX  MICROSCOPIC - Abnormal; Notable for the following:    APPearance CLOUDY (*)    Ketones, ur 40 (*)    Protein, ur 100 (*)    All other components within normal limits  URINALYSIS, MICROSCOPIC (REFLEX) - Abnormal; Notable for the following:    Bacteria, UA RARE (*)    Squamous Epithelial / LPF 0-5 (*)    All other components within normal limits  I-STAT CG4 LACTIC ACID, ED - Abnormal; Notable for the following:    Lactic Acid, Venous 5.16 (*)    All other components within normal limits  I-STAT VENOUS BLOOD GAS, ED - Abnormal; Notable for the following:    pCO2, Ven 29.0 (*)    Bicarbonate 15.5 (*)    Acid-base deficit 9.0 (*)    All other components within normal limits  CULTURE, BLOOD (ROUTINE X 2)  CULTURE, BLOOD (ROUTINE X 2)  URINE CULTURE  LIPASE, BLOOD  HCG, SERUM, QUALITATIVE  I-STAT CG4 LACTIC ACID, ED    EKG  EKG Interpretation None       Radiology Ct Abdomen Pelvis W Contrast  Result Date: 04/29/2016 CLINICAL DATA:  Abdominal pain and vomiting. EXAM: CT ABDOMEN AND PELVIS WITH CONTRAST TECHNIQUE: Multidetector CT imaging of the abdomen and pelvis was performed using the standard protocol following bolus administration of intravenous contrast. CONTRAST:  ISOVUE-300 IOPAMIDOL (ISOVUE-300) INJECTION 61% COMPARISON:   CT scan dated 02/20/2010 FINDINGS: Lower chest: Normal. Hepatobiliary: Hepatomegaly with diffuse new hepatic steatosis. Pancreas: Unremarkable. No pancreatic ductal dilatation or surrounding inflammatory changes. Spleen: Normal in size without focal abnormality. Adrenals/Urinary Tract: Adrenal glands are unremarkable. Kidneys are normal, without renal calculi, focal lesion, or hydronephrosis. Bladder is unremarkable. Stomach/Bowel: Stomach is within normal limits. Appendix appears normal. No evidence of bowel wall thickening, distention, or inflammatory changes. Vascular/Lymphatic: No significant vascular findings are present. No enlarged abdominal or pelvic lymph nodes. Reproductive: Uterus and bilateral adnexa are unremarkable. Other: No abdominal wall hernia or abnormality. No abdominopelvic ascites. Musculoskeletal: No acute or significant osseous findings. IMPRESSION: Hepatomegaly with new diffuse hepatic steatosis. Otherwise, benign appearing abdomen and pelvis. Electronically Signed   By: Francene Boyers M.D.   On: 04/29/2016 12:13    Procedures Procedures (including critical care time)  Medications Ordered in ED Medications  piperacillin-tazobactam (ZOSYN) IVPB 3.375 g (not administered)  vancomycin (VANCOCIN) IVPB 1000 mg/200 mL premix (not administered)  vancomycin (VANCOCIN) 500 MG powder (  See Procedure Record 04/29/16 1231)  sodium bicarbonate 1 mEq/mL injection (not administered)  sodium chloride 0.9 % bolus 1,000 mL (0 mLs Intravenous Stopped 04/29/16 1053)  LORazepam (ATIVAN) injection 1 mg (1 mg Intravenous Given 04/29/16 1014)  metoCLOPramide (REGLAN) injection 10 mg (10 mg Intravenous Given 04/29/16 1010)  diphenhydrAMINE (BENADRYL) injection 25 mg (25 mg Intravenous Given 04/29/16 1012)  lactated ringers bolus 1,000 mL (0 mLs Intravenous Stopped 04/29/16 1200)  iopamidol (ISOVUE-300) 61 % injection 100 mL (100 mLs Intravenous Contrast Given 04/29/16 1158)  lactated ringers bolus 1,000 mL (0  mLs Intravenous Stopped 04/29/16 1315)  piperacillin-tazobactam (ZOSYN) IVPB 3.375 g (0 g Intravenous Stopped 04/29/16 1238)  sodium chloride 0.9 % bolus 1,000 mL (0 mLs Intravenous Stopped 04/29/16 1255)  sodium chloride 0.9 % bolus 500 mL (0 mLs Intravenous Stopped 04/29/16 1343)  vancomycin (VANCOCIN) 2,500 mg in sodium chloride 0.9 % 500 mL IVPB (0 mg Intravenous Stopped 04/29/16 1435)  LORazepam (ATIVAN) injection 1 mg (1 mg Intravenous Given 04/29/16 1255)  thiamine (B-1) injection 100 mg (100 mg Intravenous Given 04/29/16  1253)  sodium bicarbonate injection 100 mEq (100 mEq Intravenous Given 04/29/16 1403)  LORazepam (ATIVAN) injection 1 mg (1 mg Intravenous Given 04/29/16 1343)  amLODipine (NORVASC) tablet 2.5 mg (2.5 mg Oral Given 04/29/16 1339)  metoprolol (LOPRESSOR) tablet 25 mg (25 mg Oral Given 04/29/16 1340)  ondansetron (ZOFRAN) injection 4 mg (4 mg Intravenous Given 04/29/16 1431)  diphenhydrAMINE (BENADRYL) injection 25 mg (25 mg Intravenous Given 04/29/16 1433)     Initial Impression / Assessment and Plan / ED Course  I have reviewed the triage vital signs and the nursing notes.  Pertinent labs & imaging results that were available during my care of the patient were reviewed by me and considered in my medical decision making (see chart for details).  Clinical Course     41 year old female with past medical history as above including recent heavy alcohol use who presents with nausea, vomiting, and inability to tolerate by mouth intake. On arrival, patient is tachycardic, hypertensive, and in mild distress. Differential includes alcohol withdrawal, alcoholic gastritis, and creatine at his, or viral GI illness. Will give IV fluids, broad labs, and reassess.  Initial lab work as above. White count mildly elevated at 10.9, likely reactive. CMP shows marked acidemia with bicarbonate of 14 and anion gap of 21. Lactate subsequently obtained and is elevated at 5. Code sepsis subsequently initiated,  although I suspect most of this is secondary to recent alcohol use and dehydration with poor by mouth intake. Will give thiamine IV as well as obtain CT scan. Vancocin Zosyn given for sepsis of likely intra-abdominal source.  CT scan shows no acute abnormality. Patient remains mildly tachycardic but this is improving with IV fluids and pain control. She has been given IV Ativan as well. No evidence of seizures. Of note, patient also missed her antihypertensives and these have been given. Will admit to telemetry for further management.  Final Clinical Impressions(s) / ED Diagnoses   Final diagnoses:  RUQ pain  Alcohol withdrawal syndrome without complication (HCC)  Dehydration  Acidemia  Lactic acidosis    New Prescriptions Current Discharge Medication List       Shaune Pollack, MD 04/29/16 1558

## 2016-04-29 NOTE — Progress Notes (Signed)
   Patient coming from [facility] for what appears to be 2 month history of persistent alcoholic intoxication resulting in hepatitis and diffuse abdominal pain now that patient is beginning to withdrawal. Patient sent to telemetry bed under inpatient status as well likely need several day admission and close monitoring. I have asked the patient receive 2amps of bicarbonate along with fluid resuscitation prior to [facility].  Shelly Flattenavid Konner Saiz, MD [facility] 04/29/2016, 1:25 PM

## 2016-04-29 NOTE — ED Notes (Signed)
Patient returned from CT

## 2016-04-29 NOTE — H&P (Addendum)
History and Physical  Marilyn Fowler ZOX:[id number] DOB: [birth year] DOA: 04/29/2016  Referring physician: EDP PCP: [name], MD   Chief Complaint: n/v, alcohol withdrawal   HPI: Marilyn Fowler is a 41 y.o. female  With h/o depression/axiety, HTN, obesity presented to [facility] due to excessive n/v. Patient states that since she lost her job 2 months ago, she has been drinking multiple pints of vodka daily. Over the last several days, she has begun to try to taper down her alcohol use but continues to drink every 3-4 hours. Over the last 48 hours, she is developed progressively worsening aching, gnawing, crampy-like abdominal pain that is now severe. She's had associated nausea and nonbloody, nonbilious emesis. Her emesis has been uncontrollable and she has not been able to eat or drink for the last 48 hours. She think her abdominal pain is from persistent vomiting. She denies fever, no diarrhea, no cough, no chest pain, no sick contact.   ED course: she is tachycardiac, but no fever, bp stable, CT ab /pel" Hepatomegaly with new diffuse hepatic steatosis. Otherwise, benign appearing abdomen and pelvis." labs wbc 10.9, bicarb 14, anion gap 21, ast 90, alt 60, lactic acid 5.16, ua + ketone, + protein, rare bacteria, no leukocyte, negative nitrite. she received zosyn and vanc x1 dose in the ED due to initial concern about sepsis. She also received multiple fluids bolus, reglanx1, ativan prn and two amp of bicarb iv, she is transferred to [facility] med tele for continued treatment.   Review of Systems:  Detail per HPI, Review of systems are otherwise negative  Past Medical History:  Diagnosis Date  . Alcohol abuse   . Anemia   . Anxiety   . Blood transfusion without reported diagnosis   . Depression   . Hemorrhoid   . Hypertension   . Migraine    Past Surgical History:  Procedure Laterality Date  . HEMORRHOID SURGERY     Oct 2011  . HEMORRHOID SURGERY    . HERNIA REPAIR    .  TONSILLECTOMY    . VENTRAL HERNIA REPAIR     Social History:  reports that she has quit smoking. She quit after 10.00 years of use. She has never used smokeless tobacco. She reports that she drinks alcohol. She reports that she does not use drugs. Patient lives at home& is able to participate in activities of daily living independently   Allergies  Allergen Reactions  . Toradol [Ketorolac Tromethamine]     Pt states it makes her jittery, felt she had something crawling on her     Family History  Problem Relation Age of Onset  . Osteoarthritis Mother   . Diabetes Mother   . Hypertension Mother   . Depression Mother   . Hypertension Father   . Diabetes Father   . Asthma Son   . Deep vein thrombosis Maternal Grandmother   . Deep vein thrombosis Maternal Grandfather   . Stroke Neg Hx       Prior to Admission medications   Medication Sig Start Date End Date Taking? Authorizing Provider  amLODipine (NORVASC) 2.5 MG tablet TAKE 1 TABLET BY MOUTH DAILY FOR HIGH BLOOD PRESSURE 07/12/15  Yes Sheliah Hatch, MD  buPROPion (WELLBUTRIN XL) 300 MG 24 hr tablet Take 300 mg by mouth daily.   Yes Historical Provider, MD  clonazePAM (KLONOPIN) 0.5 MG tablet Take 1 tablet (0.5 mg total) by mouth 3 (three) times daily as needed for anxiety. 07/30/13  Yes Tamala Julian,  PA-C  metoprolol succinate (TOPROL-XL) 25 MG 24 hr tablet Take 1 tablet (25 mg total) by mouth daily. 09/14/14  Yes Sheliah HatchKatherine E Tabori, MD  zolpidem (AMBIEN) 10 MG tablet Take 10 mg by mouth at bedtime as needed for sleep.  08/08/13  Yes Historical Provider, MD  HYDROcodone-acetaminophen (NORCO/VICODIN) 5-325 MG tablet Take 1 tablet by mouth every 6 (six) hours as needed for moderate pain. 07/31/15   Sheliah HatchKatherine E Tabori, MD  meloxicam (MOBIC) 7.5 MG tablet Take 1-2 tablets (7.5-15 mg total) by mouth daily as needed for pain. 11/28/15   Trixie DredgeEmily West, PA-C  QUEtiapine (SEROQUEL) 400 MG tablet Take 400 mg by mouth at bedtime.    Historical  Provider, MD  traMADol (ULTRAM) 50 MG tablet Take 1 tablet (50 mg total) by mouth every 6 (six) hours as needed. 12/01/15   Santiago GladHeather Laisure, PA-C    Physical Exam: BP (!) 129/95   Pulse (!) 109   Temp 99.4 F (37.4 C) (Oral)   Resp 22   Ht 5' (1.524 m)   Wt (!) 136.5 kg (301 lb)   LMP  (LMP Unknown) Comment: Negative U-Preg today  SpO2 96%   BMI 58.79 kg/m   General:  Obese, NAD, pleasant, currently denies pain, no vomiting Eyes: PERRL ENT: unremarkable Neck: supple, no JVD Cardiovascular: RRR Respiratory: CTABL Abdomen: soft/ND/ND, positive bowel sounds Skin: no rash Musculoskeletal:  No edema Psychiatric: calm/cooperative Neurologic: no focal findings            Labs on Admission:  Basic Metabolic Panel:  Recent Labs Lab 04/29/16 0946  NA 137  K 4.0  CL 102  CO2 14*  GLUCOSE 121*  BUN 6  CREATININE 0.81  CALCIUM 9.5   Liver Function Tests:  Recent Labs Lab 04/29/16 0946  AST 90*  ALT 60*  ALKPHOS 124  BILITOT 0.8  PROT 8.1  ALBUMIN 4.7    Recent Labs Lab 04/29/16 0946  LIPASE 47   No results for input(s): AMMONIA in the last 168 hours. CBC:  Recent Labs Lab 04/29/16 0946  WBC 10.9*  NEUTROABS 7.5  HGB 14.5  HCT 42.8  MCV 86.3  PLT 367   Cardiac Enzymes: No results for input(s): CKTOTAL, CKMB, CKMBINDEX, TROPONINI in the last 168 hours.  BNP (last 3 results) No results for input(s): BNP in the last 8760 hours.  ProBNP (last 3 results) No results for input(s): PROBNP in the last 8760 hours.  CBG: No results for input(s): GLUCAP in the last 168 hours.  Radiological Exams on Admission: Ct Abdomen Pelvis W Contrast  Result Date: 04/29/2016 CLINICAL DATA:  Abdominal pain and vomiting. EXAM: CT ABDOMEN AND PELVIS WITH CONTRAST TECHNIQUE: Multidetector CT imaging of the abdomen and pelvis was performed using the standard protocol following bolus administration of intravenous contrast. CONTRAST:  100mL ISOVUE-300 IOPAMIDOL (ISOVUE-300)  INJECTION 61% COMPARISON:  CT scan dated 02/20/2010 FINDINGS: Lower chest: Normal. Hepatobiliary: Hepatomegaly with diffuse new hepatic steatosis. Pancreas: Unremarkable. No pancreatic ductal dilatation or surrounding inflammatory changes. Spleen: Normal in size without focal abnormality. Adrenals/Urinary Tract: Adrenal glands are unremarkable. Kidneys are normal, without renal calculi, focal lesion, or hydronephrosis. Bladder is unremarkable. Stomach/Bowel: Stomach is within normal limits. Appendix appears normal. No evidence of bowel wall thickening, distention, or inflammatory changes. Vascular/Lymphatic: No significant vascular findings are present. No enlarged abdominal or pelvic lymph nodes. Reproductive: Uterus and bilateral adnexa are unremarkable. Other: No abdominal wall hernia or abnormality. No abdominopelvic ascites. Musculoskeletal: No acute or significant osseous findings. IMPRESSION:  Hepatomegaly with new diffuse hepatic steatosis. Otherwise, benign appearing abdomen and pelvis. Electronically Signed   By: Francene Boyers M.D.   On: 04/29/2016 12:13    Sinus  Tachycardia on tele  Assessment/Plan Present on Admission: . Alcohol withdrawal (HCC)  N/V/epigastric pain:  Likely from excessive alcohol, CT ab no acute findings, lipase wnl. she think the pain is from excessive vomiting,  start ppi to cover alcohol gastritis. Prn zofran. Continue ivf  Alcohol ketoacidosis: ivf with LR. Repeat labs in am.  Lactic acidosis: likely from alcohol use and  dehydration, no sign of infection, patient does not look septic, continue hydration, repeat lactic acid. D/c vanc/zosyn what was started in the ED. F/u blood culture obtained from the ED.  Elevated anion gap: from lactic acidosis and ketoacidosis. Expect correction with ivf and with nutrition intake.  lft elevation: likely from alcohol hepatitis with background hepatostetosis, tbili/pt/inr wnl, no indication for steroids. Supportive care, will  check hepatitis panel/HIV.  Alcohol withdrawal on ciwa protocol, currently no agitation, no confusion. Social worker consulted for alcohol abuse.  HTN; continue toprol xl, hold norvasc.  H/o depression/axiety: with h/o [facility] inpatient treatment. Continue home meds wellbutrin/seroquel currently actually very pleasant and cooperative.  Morbid obesity: Body mass index is 58.79 kg/m.  DVT prophylaxis: lovenox  Consultants: none  Code Status: full   Family Communication:  Patient and her mother at bedside  Disposition Plan: admit to med tele  Time spent:  Adena Sima MD, PhD [facility] Pager [phone] If 7PM-7AM, please contact night-coverage at [email or web address], password [facility]

## 2016-04-29 NOTE — ED Notes (Signed)
Patient transported to CT 

## 2016-04-29 NOTE — Progress Notes (Signed)
Pharmacy Antibiotic Note  Marilyn Fowler is a 41 y.o. female admitted on 04/29/2016 with vomiting. She has been abusing alcohol and reports that she has been trying to quit drinking. On admission her lactic acid is 5, she is afebrile and wbc 10.9. Planning to initiate abx for rule out sepsis.   Plan: -Zosyn 3.375 g IV q8h -Vancomycin 2500 mg IV x1 then 1g/8h -Goal vancomycin trough: 15-20 mcg/ml -Monitor renal fx, cultures, obtain VT at steady state   Height: 5' (152.4 cm) Weight: (!) 301 lb (136.5 kg) IBW/kg (Calculated) : 45.5  Temp (24hrs), Avg:99 F (37.2 C), Min:99 F (37.2 C), Max:99 F (37.2 C)   Recent Labs Lab 04/29/16 0946 04/29/16 1130  WBC 10.9*  --   CREATININE 0.81  --   LATICACIDVEN  --  5.16*    Allergies  Allergen Reactions  . Toradol [Ketorolac Tromethamine]     Pt states it makes her jittery, felt she had something crawling on her     Antimicrobials this admission: 1/1 vancomycin > 1/1 zosyn >  Dose adjustments this admission: NA  Microbiology results: 1/1 blood cx: 1/1 urine cx:   Thank you for allowing pharmacy to be a part of this patient's care.   Baldemar FridayMasters, Shaneque Merkle M 04/29/2016 11:50 AM

## 2016-04-29 NOTE — Progress Notes (Signed)
CRITICAL VALUE ALERT  Critical value received: lactic 3.2 Date of notification: 04/29/16  Time of notification:  2015  Critical value read back:Yes  Nurse who received alert:  Oneida AlarQuanisha, RN  MD notified (1st page):  Dr. Clearence PedSchorr  Time of first page: 2020   Time MD responded:  awaiting

## 2016-04-30 DIAGNOSIS — K7010 Alcoholic hepatitis without ascites: Principal | ICD-10-CM | POA: Diagnosis present

## 2016-04-30 DIAGNOSIS — F10230 Alcohol dependence with withdrawal, uncomplicated: Secondary | ICD-10-CM

## 2016-04-30 DIAGNOSIS — E876 Hypokalemia: Secondary | ICD-10-CM

## 2016-04-30 DIAGNOSIS — K700 Alcoholic fatty liver: Secondary | ICD-10-CM

## 2016-04-30 LAB — COMPREHENSIVE METABOLIC PANEL
ALBUMIN: 3.5 g/dL (ref 3.5–5.0)
ALK PHOS: 81 U/L (ref 38–126)
ALT: 43 U/L (ref 14–54)
AST: 62 U/L — ABNORMAL HIGH (ref 15–41)
Anion gap: 7 (ref 5–15)
BILIRUBIN TOTAL: 0.6 mg/dL (ref 0.3–1.2)
BUN: <5 mg/dL — ABNORMAL LOW (ref 6–20)
CALCIUM: 8.4 mg/dL — AB (ref 8.9–10.3)
CO2: 26 mmol/L (ref 22–32)
CREATININE: 0.92 mg/dL (ref 0.44–1.00)
Chloride: 108 mmol/L (ref 101–111)
GFR calc Af Amer: >60 mL/min (ref >60)
GFR calc non Af Amer: >60 mL/min (ref >60)
GLUCOSE: 114 mg/dL — AB (ref 65–99)
Potassium: 3.4 mmol/L — ABNORMAL LOW (ref 3.5–5.1)
SODIUM: 141 mmol/L (ref 135–145)
Total Protein: 5.9 g/dL — ABNORMAL LOW (ref 6.5–8.1)

## 2016-04-30 LAB — CBC
HEMATOCRIT: 34.7 % — AB (ref 36.0–46.0)
HEMOGLOBIN: 11.1 g/dL — AB (ref 12.0–15.0)
MCH: 29.1 pg (ref 26.0–34.0)
MCHC: 32 g/dL (ref 30.0–36.0)
MCV: 90.8 fL (ref 78.0–100.0)
Platelets: 148 10*3/uL — ABNORMAL LOW (ref 150–400)
RBC: 3.82 MIL/uL — AB (ref 3.87–5.11)
RDW: 16.3 % — ABNORMAL HIGH (ref 11.5–15.5)
WBC: 4.8 10*3/uL (ref 4.0–10.5)

## 2016-04-30 LAB — HEPATITIS PANEL, ACUTE
HEP B C IGM: NEGATIVE
HEP B S AG: NEGATIVE
Hep A IgM: NEGATIVE

## 2016-04-30 LAB — LIPASE, BLOOD: Lipase: 58 U/L — ABNORMAL HIGH (ref 11–51)

## 2016-04-30 LAB — URINE CULTURE

## 2016-04-30 LAB — MAGNESIUM: Magnesium: 1.6 mg/dL — ABNORMAL LOW (ref 1.7–2.4)

## 2016-04-30 LAB — HIV ANTIBODY (ROUTINE TESTING W REFLEX): HIV SCREEN 4TH GENERATION: NONREACTIVE

## 2016-04-30 MED ORDER — LORAZEPAM 1 MG PO TABS: 1.0000 mg | ORAL_TABLET | Freq: Three times a day (TID) | ORAL | 0 refills | Status: DC

## 2016-04-30 MED ORDER — ONDANSETRON HCL 4 MG PO TABS: 4.0000 mg | ORAL_TABLET | Freq: Three times a day (TID) | ORAL | 0 refills | Status: DC | PRN

## 2016-04-30 MED ORDER — MAGNESIUM SULFATE 2 GM/50ML IV SOLN
2.0000 g | Freq: Once | INTRAVENOUS | Status: AC
  Administered 2016-04-30: 2 g via INTRAVENOUS
  Filled 2016-04-30: qty 50

## 2016-04-30 MED ORDER — POTASSIUM CHLORIDE CRYS ER 20 MEQ PO TBCR
40.0000 meq | EXTENDED_RELEASE_TABLET | Freq: Once | ORAL | Status: AC
  Administered 2016-04-30: 40 meq via ORAL
  Filled 2016-04-30: qty 2

## 2016-04-30 MED ORDER — SODIUM CHLORIDE 0.9 % IV SOLN
INTRAVENOUS | Status: DC
  Administered 2016-04-30: 09:00:00 via INTRAVENOUS

## 2016-04-30 MED ORDER — LORAZEPAM 2 MG PO TABS: 2.0000 mg | ORAL_TABLET | Freq: Three times a day (TID) | ORAL | 0 refills | Status: DC | PRN

## 2016-04-30 MED ORDER — WHITE PETROLATUM GEL
Status: AC
  Administered 2016-04-30: 16:00:00
  Filled 2016-04-30: qty 1

## 2016-04-30 NOTE — Discharge Instructions (Signed)
Alcohol Withdrawal °Introduction °When a person who drinks a lot of alcohol stops drinking, he or she may go through alcohol withdrawal. Alcohol withdrawal causes problems. It can make you feel: °· Tired (fatigued). °· Sad (depressed). °· Fearful (anxious). °· Grouchy (irritable). °· Not hungry. °· Sick to your stomach (nauseous). °· Shaky. °It can also make you have: °· Nightmares. °· Trouble sleeping. °· Trouble thinking clearly. °· Mood swings. °· Clammy skin. °· Very bad sweating. °· A very fast heartbeat. °· Shaking that you cannot control (tremor). °· Having a fever. °· A fit of movements that you cannot control (seizure). °· Confusion. °· Throwing up (vomiting). °· Feeling or seeing things that are not there (hallucinations). °Follow these instructions at home: °· Take medicines and vitamins only as told by your doctor. °· Do not drink alcohol. °· Have someone around in case you need help. °· Drink enough fluid to keep your pee (urine) clear or pale yellow. °· Think about joining a group to help you stop drinking. °Contact a doctor if: °· Your problems get worse. °· Your problems do not go away. °· You cannot eat or drink without throwing up. °· You are having a hard time not drinking alcohol. °· You cannot stop drinking alcohol. °Get help right away if: °· You feel your heart beating differently than usual. °· Your chest hurts. °· You have trouble breathing. °· You have very bad problems, like: °¨ A fever. °¨ A fit of movements that you cannot control. °¨ Being very confused. °¨ Feeling or seeing things that are not there. °This information is not intended to replace advice given to you by your health care provider. Make sure you discuss any questions you have with your health care provider. °Document Released: 10/02/2007 Document Revised: 09/21/2015 Document Reviewed: 02/01/2014 °© 2017 Elsevier ° °

## 2016-04-30 NOTE — Discharge Summary (Signed)
Physician Discharge Summary  Marilyn Fowler ZOX:[id number] DOB: [birth year] DOA: 04/29/2016  PCP: [name], MD  Admit date: 04/29/2016 Discharge date: 04/30/2016  Admitted From: home Disposition:  home  Recommendations for Outpatient Follow-up:  1. Follow up with PCP in 1-2 weeks 2. Follow up with psychiatry in 10 days ( has appt)   Home Health:none Equipment/Devices: none  Discharge Condition:fair CODE STATUS: full code Diet recommendation: regular    Discharge Diagnoses:  Principal problem 41 year old morbidly obese female with history of anxiety and mood disorder presented to med [facility] with several episodes of nausea and vomiting with right upper quadrant abdominal pain. Patient informs that after losing her job 2 months back she has been drinking every day which involves at least a pint of vodka daily. She was trying to taper her drinking over the last several days. 2 days prior to admission she started developing crampy abdominal pain more prominent in the right upper quadrant shows associated with nausea with several episodes of nonbloody, nonbilious vomiting. She denied any fever, chills, headache, blurred vision, tremors, chest pain, shortness of breath, bowel or urinary symptoms.   In the ED  she was tachycardic, afebrile with stable blood pressure. Labs showed lactic acid of 5.1, WBC of 10.9, bicarbonate of 14 with anion gap of 21, AST of 90 and AST of 60. UA positive for ketones and proteins, negative for infection. CT abdomen pelvis showed hepatomegaly with diffuse hepatic steatosis. No signs of cirrhosis or ascites. Patient given a dose of IV Vanco and Zosyn. Initial concern for sepsis. Received IV fluid bolus, antiemetics and some Ativan along with 2 amp of bicarbonate and transferred to [facility] for further care.    Hospital course Acute alcoholic hepatitis Patient monitored on CIWA. Would switch to normal saline. No further abdominal pain and  tolerating diet. Nausea and vomiting has resolved. LFTs much improved in a.m. Lab. Low potassium and magnesium supplemented. Patient has a CIWA of 8 but no signs of tremors (primarily mild tachycardia and elevated blood pressure. -Patient plans to quit drinking. Will prescribe some Ativan for anxiety and mild withdrawal symptoms.  Stable to be discharged home with outpatient follow-up.   Lactic acidosis Secondary to severe dehydration. Improving with fluids. Apparently also received 2 L of IV Ringer's lactate in the ED followed by maintenance fluid with Ringer's lactate.   Mood disorder (HCC) Continue home medications. Follows with outpatient psychiatrist.  Essential hypertension Elevated blood pressure due to mild withdrawal. Continue amlodipine and metoprolol.  Hypokalemia/hypomagnesemia Replenish   Alcoholic fatty liver No signs of cirrhosis Seen by Child psychotherapist and provided outpatient resources.    Morbid obesity (HCC) Counseled on weight loss and exercise   Family communication: None at bedside  Consults: None   Procedure: CT abdomen and pelvis   Disposition: Home  Discharge Instructions   Allergies as of 04/30/2016      Reactions   Toradol [ketorolac Tromethamine]    Pt states it makes her jittery, felt she had something crawling on her       Medication List    STOP taking these medications   HYDROcodone-acetaminophen 5-325 MG tablet Commonly known as:  NORCO/VICODIN   meloxicam 7.5 MG tablet Commonly known as:  MOBIC   traMADol 50 MG tablet Commonly known as:  ULTRAM     TAKE these medications   amLODipine 2.5 MG tablet Commonly known as:  NORVASC TAKE 1 TABLET BY MOUTH DAILY FOR HIGH BLOOD PRESSURE   buPROPion 300 MG 24  hr tablet Commonly known as:  WELLBUTRIN XL Take 300 mg by mouth daily.   clonazePAM 0.5 MG tablet Commonly known as:  KLONOPIN Take 1 tablet (0.5 mg total) by mouth 3 (three) times daily as needed for anxiety.    LORazepam 1 MG tablet Commonly known as:  ATIVAN Take 1 tablet (1 mg total) by mouth every 8 (eight) hours.   metoprolol succinate 25 MG 24 hr tablet Commonly known as:  TOPROL-XL Take 1 tablet (25 mg total) by mouth daily.   ondansetron 4 MG tablet Commonly known as:  ZOFRAN Take 1 tablet (4 mg total) by mouth every 8 (eight) hours as needed for nausea or vomiting.   QUEtiapine 400 MG tablet Commonly known as:  SEROQUEL Take 400 mg by mouth at bedtime.   zolpidem 10 MG tablet Commonly known as:  AMBIEN Take 10 mg by mouth at bedtime as needed for sleep.      Follow-up Information    [name], MD Follow up in 1 week(s).   Specialty:  Family Medicine Contact information: [address] [place] [address] [phone]          Allergies  Allergen Reactions  . Toradol [Ketorolac Tromethamine]     Pt states it makes her jittery, felt she had something crawling on her       Procedures/Studies: Ct Abdomen Pelvis W Contrast  Result Date: 04/29/2016 CLINICAL DATA:  Abdominal pain and vomiting. EXAM: CT ABDOMEN AND PELVIS WITH CONTRAST TECHNIQUE: Multidetector CT imaging of the abdomen and pelvis was performed using the standard protocol following bolus administration of intravenous contrast. CONTRAST:  ISOVUE-300 IOPAMIDOL (ISOVUE-300) INJECTION 61% COMPARISON:  CT scan dated 02/20/2010 FINDINGS: Lower chest: Normal. Hepatobiliary: Hepatomegaly with diffuse new hepatic steatosis. Pancreas: Unremarkable. No pancreatic ductal dilatation or surrounding inflammatory changes. Spleen: Normal in size without focal abnormality. Adrenals/Urinary Tract: Adrenal glands are unremarkable. Kidneys are normal, without renal calculi, focal lesion, or hydronephrosis. Bladder is unremarkable. Stomach/Bowel: Stomach is within normal limits. Appendix appears normal. No evidence of bowel wall thickening, distention, or inflammatory changes. Vascular/Lymphatic: No significant  vascular findings are present. No enlarged abdominal or pelvic lymph nodes. Reproductive: Uterus and bilateral adnexa are unremarkable. Other: No abdominal wall hernia or abnormality. No abdominopelvic ascites. Musculoskeletal: No acute or significant osseous findings. IMPRESSION: Hepatomegaly with new diffuse hepatic steatosis. Otherwise, benign appearing abdomen and pelvis. Electronically Signed   By: Francene Boyers M.D.   On: 04/29/2016 12:13     Subjective: Feels much better. No nausea or vomiting since last night. Tolerating diet. Denies abdominal pain  Discharge Exam: Vitals:   04/30/16 0927 04/30/16 1414  BP: (!) 151/75 (!) 149/97  Pulse:  (!) 113  Resp:  18  Temp:  98.5 F (36.9 C)   Vitals:   04/29/16 2358 04/30/16 0142 04/30/16 0927 04/30/16 1414  BP:   (!) 151/75 (!) 149/97  Pulse: (!) 124 (!) 118  (!) 113  Resp:    18  Temp:    98.5 F (36.9 C)  TempSrc:    Oral  SpO2:    98%  Weight:      Height:        General: Middle aged morbidly obese female not in distress HEENT: No pallor, no icterus, dry mucosa Chest: Clear to auscultation bilaterally CVS: S1 and S2 tachycardic, no murmurs GI: Soft, nondistended, nontender, bowel sounds present Musculoskeletal: Warm, no edema CNS: Alert and oriented, no tremors     The results  of significant diagnostics from this hospitalization (including imaging, microbiology, ancillary and laboratory) are listed below for reference.     Microbiology: Recent Results (from the past 240 hour(s))  Urine culture     Status: Abnormal   Collection Time: 04/29/16 10:54 AM  Result Value Ref Range Status   Specimen Description URINE, RANDOM  Final   Special Requests NONE  Final   Culture MULTIPLE SPECIES PRESENT, SUGGEST RECOLLECTION (A)  Final   Report Status 04/30/2016 FINAL  Final  MRSA PCR Screening     Status: None   Collection Time: 04/29/16  6:18 PM  Result Value Ref Range Status   MRSA by PCR NEGATIVE NEGATIVE Final     Comment:        The GeneXpert MRSA Assay (FDA approved for NASAL specimens only), is one component of a comprehensive MRSA colonization surveillance program. It is not intended to diagnose MRSA infection nor to guide or monitor treatment for MRSA infections.      Labs: BNP (last 3 results) No results for input(s): BNP in the last 8760 hours. Basic Metabolic Panel:  Recent Labs Lab 04/29/16 0946 04/29/16 1916 04/30/16 1259  NA 137  --  141  K 4.0  --  3.4*  CL 102  --  108  CO2 14*  --  26  GLUCOSE 121*  --  114*  BUN 6  --  <5*  CREATININE 0.81 0.99 0.92  CALCIUM 9.5  --  8.4*  MG  --   --  1.6*   Liver Function Tests:  Recent Labs Lab 04/29/16 0946 04/30/16 1259  AST 90* 62*  ALT 60* 43  ALKPHOS 124 81  BILITOT 0.8 0.6  PROT 8.1 5.9*  ALBUMIN 4.7 3.5    Recent Labs Lab 04/29/16 0946 04/30/16 1259  LIPASE 47 58*   No results for input(s): AMMONIA in the last 168 hours. CBC:  Recent Labs Lab 04/29/16 0946 04/29/16 1916 04/30/16 1259  WBC 10.9* 9.6 4.8  NEUTROABS 7.5  --   --   HGB 14.5 11.7* 11.1*  HCT 42.8 34.7* 34.7*  MCV 86.3 86.5 90.8  PLT 367 250 148*   Cardiac Enzymes: No results for input(s): CKTOTAL, CKMB, CKMBINDEX, TROPONINI in the last 168 hours. BNP: Invalid input(s): POCBNP CBG: No results for input(s): GLUCAP in the last 168 hours. D-Dimer No results for input(s): DDIMER in the last 72 hours. Hgb A1c No results for input(s): HGBA1C in the last 72 hours. Lipid Profile No results for input(s): CHOL, HDL, LDLCALC, TRIG, CHOLHDL, LDLDIRECT in the last 72 hours. Thyroid function studies No results for input(s): TSH, T4TOTAL, T3FREE, THYROIDAB in the last 72 hours.  Invalid input(s): FREET3 Anemia work up No results for input(s): VITAMINB12, FOLATE, FERRITIN, TIBC, IRON, RETICCTPCT in the last 72 hours. Urinalysis    Component Value Date/Time   COLORURINE YELLOW 04/29/2016 1054   APPEARANCEUR CLOUDY (A) 04/29/2016 1054    LABSPEC 1.025 04/29/2016 1054   PHURINE 5.5 04/29/2016 1054   GLUCOSEU NEGATIVE 04/29/2016 1054   HGBUR NEGATIVE 04/29/2016 1054   BILIRUBINUR NEGATIVE 04/29/2016 1054   BILIRUBINUR Small 02/18/2013 1607   KETONESUR 40 (A) 04/29/2016 1054   PROTEINUR 100 (A) 04/29/2016 1054   UROBILINOGEN 0.2 07/26/2013 1827   NITRITE NEGATIVE 04/29/2016 1054   LEUKOCYTESUR NEGATIVE 04/29/2016 1054   Sepsis Labs Invalid input(s): PROCALCITONIN,  WBC,  LACTICIDVEN Microbiology Recent Results (from the past 240 hour(s))  Urine culture     Status: Abnormal   Collection  Time: 04/29/16 10:54 AM  Result Value Ref Range Status   Specimen Description URINE, RANDOM  Final   Special Requests NONE  Final   Culture MULTIPLE SPECIES PRESENT, SUGGEST RECOLLECTION (A)  Final   Report Status 04/30/2016 FINAL  Final  MRSA PCR Screening     Status: None   Collection Time: 04/29/16  6:18 PM  Result Value Ref Range Status   MRSA by PCR NEGATIVE NEGATIVE Final    Comment:        The GeneXpert MRSA Assay (FDA approved for NASAL specimens only), is one component of a comprehensive MRSA colonization surveillance program. It is not intended to diagnose MRSA infection nor to guide or monitor treatment for MRSA infections.      Time coordinating discharge:< 30 minutes  SIGNED:   Eddie North, MD  [facility] 04/30/2016, 2:25 PM Pager   If 7PM-7AM, please contact night-coverage [email or web address] Password [email or web address]

## 2016-04-30 NOTE — Progress Notes (Signed)
CSW received consult to see patient regarding alcohol abuse.  Pt has guest who was asleep in room- CSW received permission to speak with pt in front of guest.  CSW inquired about pt drinking habits at this time- pt admitted to drinking heavily over past 2 months due to loss of her job.  Pt states this is the first time she has had drinking issues like this and does not think it will be a problem to quit with the assistance of her mom.  CSW provided pt with list of outpatient rehab resources in case patient finds it difficult to quit drinking alone.  Pt expresses no other CSW needs at this time- CSW signing off  Burna SisJenna H. Sharyah Bostwick, LCSW Clinical Social Worker [phone]

## 2016-05-01 ENCOUNTER — Telehealth: Payer: Self-pay

## 2016-05-01 NOTE — Telephone Encounter (Signed)
Transition Care Management Follow-up Telephone Call   Date discharged?  01/02/018   How have you been since you were released from the hospital? "i've been resting"   Do you understand why you were in the hospital? yes, "dehydrated and alcohol withdrawal"    Do you understand the discharge instructions? yes   Where were you discharged to? Home.   Items Reviewed:  Medications reviewed: no changes  Allergies reviewed: yes  Dietary changes reviewed: no changes  Referrals reviewed: no referrals   Functional Questionnaire:   Activities of Daily Living (ADLs):   She states they are independent in the following: ambulation, bathing and hygiene, feeding, continence, grooming, toileting and dressing States they require assistance with the following: none   Any transportation issues/concerns?: no   Any patient concerns? no   Confirmed importance and date/time of follow-up visits scheduled yes  Provider Appointment booked with Dr. Beverely Lowabori on 05/03/2016 at 11am.   Confirmed with patient if condition begins to worsen call PCP or go to the ER.  Patient was given the office number and encouraged to call back with question or concerns.  : yes

## 2016-05-03 ENCOUNTER — Ambulatory Visit (INDEPENDENT_AMBULATORY_CARE_PROVIDER_SITE_OTHER): Payer: Medicaid Other | Admitting: Family Medicine

## 2016-05-03 ENCOUNTER — Encounter: Payer: Self-pay | Admitting: Family Medicine

## 2016-05-03 VITALS — BP 123/80 | HR 103 | Temp 98.4°F | Resp 17 | Ht 60.0 in | Wt 306.2 lb

## 2016-05-03 DIAGNOSIS — F10930 Alcohol use, unspecified with withdrawal, uncomplicated: Secondary | ICD-10-CM

## 2016-05-03 DIAGNOSIS — F10230 Alcohol dependence with withdrawal, uncomplicated: Secondary | ICD-10-CM | POA: Diagnosis not present

## 2016-05-03 LAB — HEPATIC FUNCTION PANEL
ALBUMIN: 4.2 g/dL (ref 3.5–5.2)
ALT: 48 U/L — ABNORMAL HIGH (ref 0–35)
AST: 74 U/L — ABNORMAL HIGH (ref 0–37)
Alkaline Phosphatase: 103 U/L (ref 39–117)
Bilirubin, Direct: 0.1 mg/dL (ref 0.0–0.3)
Total Bilirubin: 0.5 mg/dL (ref 0.2–1.2)
Total Protein: 6.7 g/dL (ref 6.0–8.3)

## 2016-05-03 LAB — BASIC METABOLIC PANEL
BUN: 7 mg/dL (ref 6–23)
CALCIUM: 10 mg/dL (ref 8.4–10.5)
CO2: 34 mEq/L — ABNORMAL HIGH (ref 19–32)
CREATININE: 1.05 mg/dL (ref 0.40–1.20)
Chloride: 99 mEq/L (ref 96–112)
GFR: 61.79 mL/min (ref >60.00)
GLUCOSE: 113 mg/dL — AB (ref 70–99)
Potassium: 4.4 mEq/L (ref 3.5–5.1)
Sodium: 140 mEq/L (ref 135–145)

## 2016-05-03 MED ORDER — ONDANSETRON HCL 4 MG PO TABS: 4.0000 mg | ORAL_TABLET | Freq: Three times a day (TID) | ORAL | 0 refills | Status: DC | PRN

## 2016-05-03 MED ORDER — LORAZEPAM 1 MG PO TABS: 1.0000 mg | ORAL_TABLET | Freq: Three times a day (TID) | ORAL | 0 refills | Status: DC

## 2016-05-03 NOTE — Assessment & Plan Note (Signed)
Pt ran out of lorazepam yesterday and is quite jittery today.  Has appt w/ psych on Monday.  Will refill Lorazepam and Zofran to get her through until Monday.  Pt is aware that she made a mistake and is not interested in drinking at this time.  Repeat BMP/LFTs.  Will continue to follow.

## 2016-05-03 NOTE — Progress Notes (Signed)
   Subjective:    Patient ID: Marilyn Fowler, female    DOB: 02-27-1976, 41 y.o.   MRN: [id number]  HPI Hospital f/u- pt was admitted 1/1-2 w/ severe dehydration and ETOH withdrawal.  Pt reports she is having some ongoing nausea and feeling jittery today.  Has appt w/ Anne Fulay Shugart (psych) on Monday.  Pt is out of Ativan and Zofran from ER- ran out of Ativan yesterday AM and Zofran last night.  Mom is with her today- reports pt has 'addictive personality'.  Pt reports mom is very supportive.  Pt started drinking when she lost her job- drinking vodka daily but not able to relay how much she was drinking daily.  Denies palpitations.  D/c summary and labs reviewed.  Med rec done.  Obesity- pt's BMI 59.81 and she is interested in bariatric surgery.  [place]   Review of Systems For ROS see HPI     Objective:   Physical Exam  Constitutional: She is oriented to person, place, and time. She appears well-developed and well-nourished. No distress.  Morbidly obese  HENT:  Head: Normocephalic and atraumatic.  Eyes: Conjunctivae and EOM are normal. Pupils are equal, round, and reactive to light.  Neck: Normal range of motion. Neck supple. No thyromegaly present.  Cardiovascular: Normal rate, regular rhythm, normal heart sounds and intact distal pulses.   No murmur heard. Pulmonary/Chest: Effort normal and breath sounds normal. No respiratory distress.  Abdominal: Soft. She exhibits no distension. There is no tenderness.  Musculoskeletal: She exhibits no edema.  Lymphadenopathy:    She has no cervical adenopathy.  Neurological: She is alert and oriented to person, place, and time.  Jittery in office  Skin: Skin is warm and dry.  Psychiatric: She has a normal mood and affect. Her behavior is normal.  Vitals reviewed.         Assessment & Plan:

## 2016-05-03 NOTE — Assessment & Plan Note (Signed)
Chronic problem.  Pt is now interested in pursuing bariatric surgery.  Since she is [place] [facility], I cannot refer but I provided her the information to make contact and set up an appt.  Pt expressed understanding and is in agreement w/ plan.

## 2016-05-03 NOTE — Patient Instructions (Signed)
Follow up in 1 month to see how things are going We'll notify you of your lab results and make any changes if needed Use the Lorazepam up to 3x/day as needed for alcohol withdrawal Use the Zofran as needed for nausea Please call DR TEPPARA at [phone] to set up a bariatric appt Call with any questions or concerns Hang in there!!!  You are stronger than you give yourself credit for!!

## 2016-05-03 NOTE — Progress Notes (Signed)
Pre visit review using our clinic review tool, if applicable. No additional management support is needed unless otherwise documented below in the visit note. 

## 2016-05-04 LAB — CULTURE, BLOOD (ROUTINE X 2)
CULTURE: NO GROWTH
Culture: NO GROWTH

## 2016-06-06 ENCOUNTER — Encounter: Payer: Self-pay | Admitting: Family Medicine

## 2016-06-06 ENCOUNTER — Ambulatory Visit: Payer: Self-pay | Admitting: Family Medicine

## 2016-06-10 ENCOUNTER — Encounter: Payer: Self-pay | Admitting: Family Medicine

## 2016-06-10 MED ORDER — METOPROLOL SUCCINATE ER 25 MG PO TB24: 25.0000 mg | ORAL_TABLET | Freq: Every day | ORAL | 1 refills | Status: DC

## 2016-06-10 MED ORDER — AMLODIPINE BESYLATE 2.5 MG PO TABS: 2.5000 mg | ORAL_TABLET | Freq: Every day | ORAL | 1 refills | Status: DC

## 2016-06-20 ENCOUNTER — Ambulatory Visit: Payer: Self-pay | Admitting: Family Medicine

## 2016-06-26 ENCOUNTER — Ambulatory Visit: Payer: Self-pay | Admitting: Family Medicine

## 2016-07-18 ENCOUNTER — Ambulatory Visit (INDEPENDENT_AMBULATORY_CARE_PROVIDER_SITE_OTHER): Payer: Medicaid Other | Admitting: Family Medicine

## 2016-07-18 ENCOUNTER — Encounter: Payer: Self-pay | Admitting: Family Medicine

## 2016-07-18 VITALS — BP 128/82 | HR 119 | Temp 98.1°F | Resp 17 | Ht 60.0 in | Wt 302.1 lb

## 2016-07-18 DIAGNOSIS — M62838 Other muscle spasm: Secondary | ICD-10-CM

## 2016-07-18 MED ORDER — MELOXICAM 15 MG PO TABS: 15.0000 mg | ORAL_TABLET | Freq: Every day | ORAL | 0 refills | Status: DC

## 2016-07-18 MED ORDER — CYCLOBENZAPRINE HCL 10 MG PO TABS: 10.0000 mg | ORAL_TABLET | Freq: Three times a day (TID) | ORAL | 0 refills | Status: DC | PRN

## 2016-07-18 NOTE — Progress Notes (Signed)
   Subjective:    Patient ID: Marilyn Fowler, female    DOB: [birth year], 41 y.o.   MRN: [id number]  HPI R shoulder pain- pt reports she 'pulled my shoulder' ~3 weeks ago.  No relief w/ tylenol or ibuprofen.  Took mom's T3 w/ good relief.  No known injury.  Pain is localized to R shoulder blade.  Pain is constant- worse w/ movement.   Review of Systems For ROS see HPI     Objective:   Physical Exam  Constitutional: She is oriented to person, place, and time. She appears well-developed and well-nourished. No distress.  obese  HENT:  Head: Normocephalic and atraumatic.  Cardiovascular: Intact distal pulses.   Musculoskeletal: She exhibits tenderness (TTP over R rhomboid, pain w/ rowing motion). She exhibits no edema or deformity.  Neurological: She is alert and oriented to person, place, and time. She has normal reflexes.  Skin: Skin is warm and dry. No rash noted.  Psychiatric: She has a normal mood and affect. Her behavior is normal. Thought content normal.  Vitals reviewed.         Assessment & Plan:  R rhomboid spasm- new.  Start daily Mobic.  Flexeril prn.  Heating pad.  If no improvement, refer to PT.  Reviewed supportive care and red flags that should prompt return.  Pt expressed understanding and is in agreement w/ plan.

## 2016-07-18 NOTE — Progress Notes (Signed)
Pre visit review using our clinic review tool, if applicable. No additional management support is needed unless otherwise documented below in the visit note. 

## 2016-07-18 NOTE — Patient Instructions (Signed)
Follow up as needed/scheduled Start the Mobic once daily for pain/inflammation- take w/ food Use the flexeril (muscle relaxer) up to 3x/day for pain relief (you may need a 1/2 tab during the day and 1 tab nightly due to drowsiness) HEAT! If no improvement in pain by next week, let me know and we'll refer to PT Call with any questions or concerns Hang in there!!!

## 2016-08-14 ENCOUNTER — Telehealth: Payer: Self-pay | Admitting: Family Medicine

## 2016-08-14 ENCOUNTER — Other Ambulatory Visit: Payer: Self-pay | Admitting: General Practice

## 2016-08-14 MED ORDER — ONDANSETRON HCL 4 MG PO TABS: 4.0000 mg | ORAL_TABLET | Freq: Three times a day (TID) | ORAL | 0 refills | Status: DC | PRN

## 2016-08-14 NOTE — Telephone Encounter (Signed)
Patient states she has caught stomach bug from kids.  She is not able to come in for an appt due to new job.  She is requesting a rx for zofran.  Pharmacy:  [id number] - [place], [place] - [address] [phone] (Phone) [phone] (Fax)

## 2016-08-14 NOTE — Telephone Encounter (Signed)
Med filled, pt will be made aware.

## 2016-08-14 NOTE — Telephone Encounter (Signed)
Ok for Zofran  TID prn, #20, no refills

## 2016-08-29 ENCOUNTER — Other Ambulatory Visit: Payer: Self-pay | Admitting: Family Medicine

## 2016-08-30 ENCOUNTER — Ambulatory Visit: Payer: Self-pay | Admitting: Family Medicine

## 2016-08-30 ENCOUNTER — Other Ambulatory Visit: Payer: Self-pay

## 2016-08-30 ENCOUNTER — Ambulatory Visit (INDEPENDENT_AMBULATORY_CARE_PROVIDER_SITE_OTHER): Payer: Medicaid Other | Admitting: Physician Assistant

## 2016-08-30 ENCOUNTER — Encounter: Payer: Self-pay | Admitting: Family Medicine

## 2016-08-30 VITALS — BP 168/104 | HR 83 | Temp 98.5°F | Wt 296.0 lb

## 2016-08-30 DIAGNOSIS — M25511 Pain in right shoulder: Secondary | ICD-10-CM | POA: Diagnosis not present

## 2016-08-30 DIAGNOSIS — D649 Anemia, unspecified: Secondary | ICD-10-CM

## 2016-08-30 DIAGNOSIS — G8929 Other chronic pain: Secondary | ICD-10-CM

## 2016-08-30 LAB — CBC WITH DIFFERENTIAL/PLATELET
BASOS ABS: 0 {cells}/uL (ref 0–200)
Basophils Relative: 0 %
EOS ABS: 68 {cells}/uL (ref 15–500)
Eosinophils Relative: 1 %
HCT: 40 % (ref 35.0–45.0)
Hemoglobin: 12.8 g/dL (ref 11.7–15.5)
LYMPHS PCT: 37 %
Lymphs Abs: 2516 cells/uL (ref 850–3900)
MCH: 28.5 pg (ref 27.0–33.0)
MCHC: 32 g/dL (ref 32.0–36.0)
MCV: 89.1 fL (ref 80.0–100.0)
MONOS PCT: 13 %
MPV: 9.7 fL (ref 7.5–12.5)
Monocytes Absolute: 884 cells/uL (ref 200–950)
Neutro Abs: 3332 cells/uL (ref 1500–7800)
Neutrophils Relative %: 49 %
Platelets: 263 10*3/uL (ref 140–400)
RBC: 4.49 MIL/uL (ref 3.80–5.10)
RDW: 16.5 % — AB (ref 11.0–15.0)
WBC: 6.8 10*3/uL (ref 3.8–10.8)

## 2016-08-30 NOTE — Patient Instructions (Signed)
It was great meeting you.  We will call you with your lab results. Please follow-up with Dr. Beverely Lowabori for further review of your labs and further lab draws. Please follow-up with Dr. Berline Choughigby here at [facility] in sports medicine for evaluation of your shoulder pain.

## 2016-08-30 NOTE — Progress Notes (Signed)
Marilyn MaesLeslie Fowler is a 41 y.o. female here for a new problem.   History of Present Illness:   Chief Complaint  Patient presents with  . Shoulder Pain    X2weeks     HPI   R shoulder xray was completed in October 2016 -- this was normal. Taking Meloxicam daily for about two weeks, not helping with pain.   Flexeril and Meloxicam, has been taking . Will get pain     PMHx, SurgHx, SocialHx, Medications, and Allergies were reviewed in the Visit Navigator and updated as appropriate.  Current Medications:   Current Outpatient Prescriptions:  .  amLODipine (NORVASC) 2.5 MG tablet, TAKE 1 TABLET(2.5 MG) BY MOUTH DAILY FOR HIGH BLOOD PRESSURE, Disp: 30 tablet, Rfl: 6 .  buPROPion (WELLBUTRIN XL) 300 MG 24 hr tablet, Take 300 mg by mouth daily., Disp: , Rfl:  .  clonazePAM (KLONOPIN) 1 MG tablet, , Disp: , Rfl: 0 .  EQUETRO 300 MG CP12, TK ONE C PO D, Disp: , Rfl: 1 .  LORazepam (ATIVAN) 1 MG tablet, Take 1 tablet (1 mg total) by mouth every 8 (eight) hours., Disp: 10 tablet, Rfl: 0 .  metoprolol succinate (TOPROL-XL) 25 MG 24 hr tablet, TAKE 1 TABLET(25 MG) BY MOUTH DAILY, Disp: 30 tablet, Rfl: 6 .  zolpidem (AMBIEN) 10 MG tablet, Take 10 mg by mouth at bedtime as needed for sleep. , Disp: , Rfl:  .  cyclobenzaprine (FLEXERIL) 10 MG tablet, Take 1 tablet (10 mg total) by mouth 3 (three) times daily as needed for muscle spasms. (Patient not taking: Reported on 08/30/2016), Disp: 30 tablet, Rfl: 0 .  meloxicam (MOBIC) 15 MG tablet, Take 1 tablet (15 mg total) by mouth daily. (Patient not taking: Reported on 08/30/2016), Disp: 30 tablet, Rfl: 0 .  ondansetron (ZOFRAN) 4 MG tablet, Take 1 tablet (4 mg total) by mouth every 8 (eight) hours as needed for nausea or vomiting. (Patient not taking: Reported on 08/30/2016), Disp: 20 tablet, Rfl: 0 .  PAZEO 0.7 % SOLN, INT 1 GTT INTO OU ONCE A DAY PRN, Disp: , Rfl: 5   Review of Systems:   ROS  Vitals:   Vitals:   08/30/16 1611  BP: (!) 168/104  Pulse:  83  Temp: 98.5 F (36.9 C)  TempSrc: Oral  SpO2: 95%  Weight: 296 lb (134.3 kg)     Body mass index is 57.81 kg/m.  Physical Exam:   Physical Exam  Assessment and Plan:    There are no diagnoses linked to this encounter.  . Reviewed expectations re: course of current medical issues. . Discussed self-management of symptoms. . Outlined signs and symptoms indicating need for more acute intervention. . Patient verbalized understanding and all questions were answered. . See orders for this visit as documented in the electronic medical record. . Patient received an After-Visit Summary.  CMA or LPN served as scribe during this visit. History, Physical, and Plan performed by medical provider. Documentation and orders reviewed and attested to.  Jarold MottoSamantha Amaani Guilbault, PA-C

## 2016-08-31 NOTE — Progress Notes (Signed)
Marilyn Fowler is a 41 y.o. female here for a new problem.  SCRIBE STATEMENT  History of Present Illness:   Chief Complaint  Patient presents with  . Shoulder Pain    HPI   Patient reports R shoulder pain since Oct 2016. She was diagnosed with rotator cuff strain in the ED at that time. She had xrays completed and they were normal. Since that time she has been taking intermittent Meloxicam and Flexeril. She is currently out of Flexeril. She has had a recent recurrence of worsening shoulder pain over the past 2 weeks and has been taking her 34m Meloxicam daily without relief. Her shoulder pain is constant and she is interested in a shoulder injection today if possible. She denies any initial trauma to her shoulder or trauma over the past two weeks that could have aggravated her symptoms. She also reports a history of anemia and is worried that the Meloxicam has caused some gastritis. She had a bleeding hemorrhoid a few weeks ago, but denies dizziness, lightheadedness, or fatigue, she would like her CBC checked today to assess for anemia. Denies numbness or tingling down R arm.  PMHx, SurgHx, SocialHx, Medications, and Allergies were reviewed in the Visit Navigator and updated as appropriate.  Current Medications:   Current Outpatient Prescriptions:  .  amLODipine (NORVASC) 2.5 MG tablet, TAKE 1 TABLET(2.5 MG) BY MOUTH DAILY FOR HIGH BLOOD PRESSURE, Disp: 30 tablet, Rfl: 6 .  buPROPion (WELLBUTRIN XL) 300 MG 24 hr tablet, Take 300 mg by mouth daily., Disp: , Rfl:  .  clonazePAM (KLONOPIN) 1 MG tablet, , Disp: , Rfl: 0 .  cyclobenzaprine (FLEXERIL) 10 MG tablet, Take 1 tablet (10 mg total) by mouth 3 (three) times daily as needed for muscle spasms. (Patient not taking: Reported on 08/30/2016), Disp: 30 tablet, Rfl: 0 .  EQUETRO 300 MG CP12, TK ONE C PO D, Disp: , Rfl: 1 .  LORazepam (ATIVAN) 1 MG tablet, Take 1 tablet (1 mg total) by mouth every 8 (eight) hours., Disp: 10 tablet, Rfl: 0 .   meloxicam (MOBIC) 15 MG tablet, Take 1 tablet (15 mg total) by mouth daily. (Patient not taking: Reported on 08/30/2016), Disp: 30 tablet, Rfl: 0 .  metoprolol succinate (TOPROL-XL) 25 MG 24 hr tablet, TAKE 1 TABLET(25 MG) BY MOUTH DAILY, Disp: 30 tablet, Rfl: 6 .  ondansetron (ZOFRAN) 4 MG tablet, Take 1 tablet (4 mg total) by mouth every 8 (eight) hours as needed for nausea or vomiting. (Patient not taking: Reported on 08/30/2016), Disp: 20 tablet, Rfl: 0 .  PAZEO 0.7 % SOLN, INT 1 GTT INTO OU ONCE A DAY PRN, Disp: , Rfl: 5 .  zolpidem (AMBIEN) 10 MG tablet, Take 10 mg by mouth at bedtime as needed for sleep. , Disp: , Rfl:    Review of Systems:   Review of Systems  Constitutional: Negative for chills and fever.  Musculoskeletal: Positive for joint pain (shoulder).  Skin: Negative for rash.  Neurological: Negative for tingling, sensory change and headaches.    Vitals:   There were no vitals filed for this visit.   There is no height or weight on file to calculate BMI.  Physical Exam:   Physical Exam  Constitutional: She appears well-developed. She is cooperative.  Non-toxic appearance. She does not have a sickly appearance. She does not appear ill. No distress.  Cardiovascular: Normal rate, regular rhythm, S1 normal, S2 normal, normal heart sounds and normal pulses.   No LE edema  Pulmonary/Chest: Effort  normal and breath sounds normal.  Musculoskeletal:       Right shoulder: She exhibits decreased range of motion (unable to lift arm above shoulder height 2/2 pain). She exhibits no tenderness, no bony tenderness, no swelling, no deformity and no laceration.  Pain with resisted abduction of R arm  Neurological: She is alert. No cranial nerve deficit or sensory deficit. GCS eye subscore is 4. GCS verbal subscore is 5. GCS motor subscore is 6.  Grip strength 5/5 bilaterally  Nursing note and vitals reviewed.   PROCEDURE NOTE: R subacromialINJECTION   DESCRIPTION OF PROCEDURE:  The  patient's clinical condition is marked by substantial pain and/or significant functional disability. Other conservative therapy has not provided relief, is contraindicated, or not appropriate. There is a reasonable likelihood that injection will significantly improve the patient's pain and/or functional impairment. After discussing the risks, benefits and expected outcomes of the injection and all questions were reviewed and answered, the patient wished to undergo the above named procedure. Verbal consent was obtained. The target structure was injected under direct visualization using sterile technique as below: PREP: Alcohol, Ethel Chloride, 5cc 1% lidocaine on 25 needle APPROACH: posterior, 22g 1.5" needle INJECTATE: 3cc 1% lidocaine, 1cc 67m DepoMedrol  DRESSING: Band-Aid  Post procedural instructions including recommending icing and warning signs for infection were reviewed. This procedure was well tolerated and there were no complications.   Results for orders placed or performed in visit on 08/30/16  CBC with Differential/Platelet  Result Value Ref Range   WBC 6.8 3.8 - 10.8 K/uL   RBC 4.49 3.80 - 5.10 MIL/uL   Hemoglobin 12.8 11.7 - 15.5 g/dL   HCT 40.0 35.0 - 45.0 %   MCV 89.1 80.0 - 100.0 fL   MCH 28.5 27.0 - 33.0 pg   MCHC 32.0 32.0 - 36.0 g/dL   RDW 16.5 (H) 11.0 - 15.0 %   Platelets 263 140 - 400 K/uL   MPV 9.7 7.5 - 12.5 fL   Neutro Abs 3,332 1,500 - 7,800 cells/uL   Lymphs Abs 2,516 850 - 3,900 cells/uL   Monocytes Absolute 884 200 - 950 cells/uL   Eosinophils Absolute 68 15 - 500 cells/uL   Basophils Absolute 0 0 - 200 cells/uL   Neutrophils Relative % 49 %   Lymphocytes Relative 37 %   Monocytes Relative 13 %   Eosinophils Relative 1 %   Basophils Relative 0 %   Smear Review Criteria for review not met      Assessment and Plan:    LVerlewas seen today for shoulder pain.  Diagnoses and all orders for this visit:  Anemia, unspecified type -     CBC with  Differential/Platelet  Chronic right shoulder pain   Patient agreeable to shoulder injection. Received injection, assisted by Dr. MTeresa Coombsduring procedure. Patient tolerated well. Advised to continue Mobic for 2-3 more days while injection is working. We also checked her CBC per her request given hx of anemia, CBC with normal hemoglobin and hematocrit. Advised patient to follow-up with Dr. MTeresa Coombsif no improvement in symptoms within 2 weeks. Patient verbalized understanding.   . Reviewed expectations re: course of current medical issues. . Discussed self-management of symptoms. . Outlined signs and symptoms indicating need for more acute intervention. . Patient verbalized understanding and all questions were answered. . See orders for this visit as documented in the electronic medical record. . Patient received an After-Visit Summary.  CMA or LPN served as sEducation administratorduring this  visit. History, Physical, and Plan performed by medical provider. Documentation and orders reviewed and attested to.  Inda Coke, PA-C

## 2016-09-02 ENCOUNTER — Encounter: Payer: Self-pay | Admitting: Physician Assistant

## 2016-09-04 ENCOUNTER — Ambulatory Visit: Payer: Self-pay

## 2016-09-04 ENCOUNTER — Ambulatory Visit (INDEPENDENT_AMBULATORY_CARE_PROVIDER_SITE_OTHER): Payer: Medicaid Other | Admitting: Sports Medicine

## 2016-09-04 ENCOUNTER — Encounter: Payer: Self-pay | Admitting: Sports Medicine

## 2016-09-04 VITALS — BP 140/110 | HR 93 | Ht 60.0 in | Wt 293.0 lb

## 2016-09-04 DIAGNOSIS — G8929 Other chronic pain: Secondary | ICD-10-CM

## 2016-09-04 DIAGNOSIS — M25562 Pain in left knee: Secondary | ICD-10-CM | POA: Diagnosis not present

## 2016-09-04 DIAGNOSIS — M25511 Pain in right shoulder: Secondary | ICD-10-CM | POA: Diagnosis not present

## 2016-09-04 MED ORDER — CYCLOBENZAPRINE HCL 10 MG PO TABS: 10.0000 mg | ORAL_TABLET | Freq: Three times a day (TID) | ORAL | 0 refills | Status: DC | PRN

## 2016-09-04 NOTE — Assessment & Plan Note (Signed)
This pain has been present for greater than 1 year.  She is undergone injection with gradual worsening in her symptoms as well as conservative care over the past year with therapeutic exercises.  Given the lack of improvement and significant pain and weakness MRI of the shoulder indicated at this time.  Plan to consider referral to orthopedics pending the findings on MRI.  Continue with gentle range of motion and avoidance of exacerbating activities.  Of note this was a no charge ultrasound given the patient's [place] [facility] [facility] and prior authorization was not obtained.  Also limitations with formal physical therapy due to this issue as well.

## 2016-09-04 NOTE — Patient Instructions (Signed)

## 2016-09-04 NOTE — Progress Notes (Signed)
OFFICE VISIT NOTE Marilyn FellsMichael D. Marilyn Shinerigby, DO  [facility] [phone]  [name] - 41 y.o. female MRN [id number]  Date of birth: 04-06-1976  Visit Date: 09/04/2016  PCP: Sheliah Fowler, Marilyn E, MD   Referred by: Sheliah Fowler, Marilyn E, MD  Marilyn Fowler,cma acting as scribe for Dr. Berline Choughigby.  SUBJECTIVE:   Chief Complaint  Patient presents with  . NP: Chronic Right Shoulder Pain   HPI: As below and per problem based documentation when appropriate.  Verlon AuLeslie has a chronic HX of rt shoulder pain since October 2016, DX w/ rotator cuff strain in the ED at that time. Xray results at that time were wnls. Took intermittently Meloxicam and Flexeril together which did help but has not used medications since February. Uses Motrin with no relief. Denies numbness or tingling down R arm. Worsening pain sx since Steriod Injection given on 08/30/2016.     Review of Systems  Constitutional: Negative.   HENT: Negative.   Eyes: Negative.   Respiratory: Negative.   Cardiovascular: Negative.   Gastrointestinal: Negative.   Genitourinary: Negative.   Musculoskeletal: Positive for joint pain.  Skin: Negative.   Neurological: Negative.   Endo/Heme/Allergies: Negative.   Psychiatric/Behavioral: Negative.     Otherwise per HPI.  HISTORY & PERTINENT PRIOR DATA:  No specialty comments available. She reports that she has quit smoking. She quit after 10.00 years of use. She has never used smokeless tobacco. No results for input(s): HGBA1C, LABURIC in the last 8760 hours. Medications & Allergies reviewed per EMR Patient Active Problem List   Diagnosis Date Noted  . Chronic right shoulder pain 09/04/2016  . Hypokalemia 04/30/2016  . Hypomagnesemia 04/30/2016  . Alcoholic hepatitis 04/30/2016  . Alcohol induced fatty liver 04/30/2016  . Alcohol withdrawal (HCC) 04/29/2016  . Alcohol abuse 04/29/2016  . Left medial knee pain 06/29/2015  . Acute bacterial  sinusitis 10/11/2014  . Maxillary sinusitis, acute 09/14/2014  . Gastroenteritis 11/09/2013  . Pleuritic chest pain 09/17/2013  . Mood disorder (HCC) 07/26/2013  . Chest pain 11/18/2012  . Morbid obesity (HCC) 09/29/2012  . HTN (hypertension) 07/12/2012  . Migraine 07/12/2012  . Bipolar II disorder, most recent episode major depressive (HCC) 07/11/2012   Past Medical History:  Diagnosis Date  . Alcohol abuse   . Anemia   . Anxiety   . Blood transfusion without reported diagnosis   . Depression   . Hemorrhoid   . Hypertension   . Migraine    Family History  Problem Relation Age of Onset  . Osteoarthritis Mother   . Diabetes Mother   . Hypertension Mother   . Depression Mother   . Hypertension Father   . Diabetes Father   . Asthma Son   . Deep vein thrombosis Maternal Grandmother   . Deep vein thrombosis Maternal Grandfather   . Stroke Neg Hx    Past Surgical History:  Procedure Laterality Date  . HEMORRHOID SURGERY     Oct 2011  . HEMORRHOID SURGERY    . HERNIA REPAIR    . TONSILLECTOMY    . VENTRAL HERNIA REPAIR     Social History   Occupational History  .  [employer]   Social History Main Topics  . Smoking status: Former Smoker    Years: 10.00  . Smokeless tobacco: Never Used     Comment: smoked 1994-2012, up to 1 pp WEEK  . Alcohol use Yes     Comment: started drinking Vodka 2.5  mos ago when she lost job  . Drug use: No  . Sexual activity: No    OBJECTIVE:  VS:  HT:5' (152.4 cm)   WT:293 lb (132.9 kg)  BMI:57.3    BP:(!) 140/110  HR:93bpm  TEMP: ( )  RESP:98 % EXAM: Findings:  WDWN, NAD, Non-toxic appearing Alert & appropriately interactive Not depressed or anxious appearing No increased work of breathing. Pupils are equal. EOM intact without nystagmus No clubbing or cyanosis of the extremities appreciated No significant rashes/lesions/ulcerations overlying the examined area. Radial pulses 2+/4.  No significant generalized UE  edema.     Right Shoulder:  Normal alignment. No significant deformity.  Non tender over clavical, AC Joint  TTP over the anterior shoulder. Full overhead range of motion with symmetric internal/external rotation. Pain and weakness with empty can testing 4/5, speeds testing and O'Brien's testing at 3/5 Minimal pain with axial loading and circumduction.   +++++++++++++++++++++++++++++++++++++++++++++++++++++++++++++++++++++++++++++ LIMITED MSK ULTRASOUND OF RIGHT SHOULDER Images were obtained and interpreted by myself, Gaspar Bidding, DO  Images have been saved and stored to PACS system. Images obtained on: GE S7 Ultrasound machine  FINDINGS:  Biceps Tendon: Normal Pec Major Insertion: Normal Subscapularis Tendon: Superior fiber split with marginal thickening. Supraspinatus Tendon: Large splint of the supraspinous tendon with hypoechoic change in only minimal retraction appreciated. Infraspinatus/Teres Minor Tendon: Normal AC Joint: Mild degenerative change JOINT: No significant GH spurring appreciated LABRUM: Not evaluated  IMPRESSION:  Large with some sinus rotator cuff tear.       No results found. ASSESSMENT & PLAN:   Problem List Items Addressed This Visit    Left medial knee pain   Chronic right shoulder pain - Primary    This pain has been present for greater than 1 year.  She is undergone injection with gradual worsening in her symptoms as well as conservative care over the past year with therapeutic exercises.  Given the lack of improvement and significant pain and weakness MRI of the shoulder indicated at this time.  Plan to consider referral to orthopedics pending the findings on MRI.  Continue with gentle range of motion and avoidance of exacerbating activities.  Of note this was a no charge ultrasound given the patient's insurance source is [facility] and prior authorization was not obtained.  Also limitations with formal physical therapy due to  this issue as well.        Relevant Medications   cyclobenzaprine (FLEXERIL) 10 MG tablet   Other Relevant Orders   [place] LIMITED JOINT SPACE STRUCTURES UP LEFT(NO LINKED CHARGES)   MR Shoulder Right Wo Contrast      Follow-up: Return for MRI Review.   >50% of this 30 minute visit spent in direct patient counseling and/or coordination of care.   CMA/ATC served as Neurosurgeon during this visit. History, Physical, and Plan performed by medical provider. Documentation and orders reviewed and attested to.      Gaspar Bidding, DO    [facility]    09/04/2016 11:34 PM

## 2016-09-06 ENCOUNTER — Encounter: Payer: Self-pay | Admitting: Sports Medicine

## 2016-09-11 ENCOUNTER — Other Ambulatory Visit: Payer: Self-pay

## 2016-09-11 MED ORDER — CYCLOBENZAPRINE HCL 10 MG PO TABS: 10.0000 mg | ORAL_TABLET | Freq: Three times a day (TID) | ORAL | 1 refills | Status: DC | PRN

## 2016-09-13 ENCOUNTER — Ambulatory Visit
Admission: RE | Admit: 2016-09-13 | Discharge: 2016-09-13 | Disposition: A | Discharge status: Home / Self Care | Payer: Medicaid Other | Source: Ambulatory Visit | Attending: Sports Medicine | Admitting: Sports Medicine

## 2016-09-13 DIAGNOSIS — M25511 Pain in right shoulder: Principal | ICD-10-CM

## 2016-09-13 DIAGNOSIS — G8929 Other chronic pain: Secondary | ICD-10-CM

## 2016-09-17 ENCOUNTER — Encounter: Payer: Self-pay | Admitting: Sports Medicine

## 2017-01-29 ENCOUNTER — Emergency Department (HOSPITAL_BASED_OUTPATIENT_CLINIC_OR_DEPARTMENT_OTHER)
Admission: EM | Admit: 2017-01-29 | Discharge: 2017-01-29 | Disposition: A | Discharge status: Home / Self Care | Payer: 59 | Attending: Emergency Medicine | Admitting: Emergency Medicine

## 2017-01-29 ENCOUNTER — Encounter (HOSPITAL_BASED_OUTPATIENT_CLINIC_OR_DEPARTMENT_OTHER): Payer: Self-pay | Admitting: Emergency Medicine

## 2017-01-29 ENCOUNTER — Emergency Department (HOSPITAL_BASED_OUTPATIENT_CLINIC_OR_DEPARTMENT_OTHER): Payer: 59

## 2017-01-29 DIAGNOSIS — Y998 Other external cause status: Secondary | ICD-10-CM | POA: Insufficient documentation

## 2017-01-29 DIAGNOSIS — G44309 Post-traumatic headache, unspecified, not intractable: Secondary | ICD-10-CM

## 2017-01-29 DIAGNOSIS — W19XXXA Unspecified fall, initial encounter: Secondary | ICD-10-CM | POA: Diagnosis not present

## 2017-01-29 DIAGNOSIS — Z79899 Other long term (current) drug therapy: Secondary | ICD-10-CM | POA: Insufficient documentation

## 2017-01-29 DIAGNOSIS — I10 Essential (primary) hypertension: Secondary | ICD-10-CM | POA: Diagnosis not present

## 2017-01-29 DIAGNOSIS — Y939 Activity, unspecified: Secondary | ICD-10-CM | POA: Insufficient documentation

## 2017-01-29 DIAGNOSIS — S060X0A Concussion without loss of consciousness, initial encounter: Secondary | ICD-10-CM

## 2017-01-29 DIAGNOSIS — Z87891 Personal history of nicotine dependence: Secondary | ICD-10-CM | POA: Diagnosis not present

## 2017-01-29 DIAGNOSIS — Y929 Unspecified place or not applicable: Secondary | ICD-10-CM | POA: Insufficient documentation

## 2017-01-29 MED ORDER — DIPHENHYDRAMINE HCL 50 MG/ML IJ SOLN
25.0000 mg | Freq: Once | INTRAMUSCULAR | Status: AC
  Administered 2017-01-29: 25 mg via INTRAVENOUS
  Filled 2017-01-29: qty 1

## 2017-01-29 MED ORDER — PROCHLORPERAZINE MALEATE 10 MG PO TABS: 10.0000 mg | ORAL_TABLET | Freq: Two times a day (BID) | ORAL | 0 refills | Status: DC | PRN

## 2017-01-29 MED ORDER — PROCHLORPERAZINE EDISYLATE 5 MG/ML IJ SOLN
10.0000 mg | Freq: Once | INTRAMUSCULAR | Status: AC
  Administered 2017-01-29: 10 mg via INTRAVENOUS
  Filled 2017-01-29: qty 2

## 2017-01-29 MED FILL — PROCHLORPERAZINE 10 MG TAB: 10 | 5 days supply | Qty: 10 | Fill #0

## 2017-01-29 NOTE — ED Triage Notes (Addendum)
"   I fell and hit my head  on Sat and I have had a h/a since then with dizziness"  Was cleaning BR and slipped and hit forehead on tub, no LOC. Took tylenol at 0200

## 2017-01-29 NOTE — ED Notes (Signed)
Patient transported to CT 

## 2017-01-29 NOTE — Discharge Instructions (Signed)
Your workup today shows no evidence of intracranial injury or bleed. I suspect you have a concussion. Please follow-up with your primary doctor and use the nausea medicine to help with your symptoms. Please stay hydrated. Please get rest. If any symptoms change or worsen, please return to the nearest emergency department.

## 2017-01-29 NOTE — ED Provider Notes (Signed)
[facility]   CSN: [id number] Arrival date & time: 01/29/17  4401     History   Chief Complaint Chief Complaint  Patient presents with  . Headache    HPI Marilyn Fowler is a 41 y.o. female.  The history is provided by the patient and medical records. No language interpreter was used.  Headache   This is a new problem. The current episode started more than 2 days ago. The problem occurs constantly. The problem has not changed since onset.The headache is associated with nothing. The pain is located in the frontal region. The quality of the pain is described as dull and throbbing. The pain is moderate. The pain does not radiate. Associated symptoms include nausea. Pertinent negatives include no anorexia, no fever, no chest pressure, no near-syncope, no palpitations, no syncope, no shortness of breath and no vomiting. She has tried nothing for the symptoms. The treatment provided no relief.  Fall  This is a new problem. The current episode started more than 2 days ago. The problem occurs rarely. The problem has been resolved. Associated symptoms include headaches. Pertinent negatives include no chest pain, no abdominal pain and no shortness of breath. Nothing aggravates the symptoms. Nothing relieves the symptoms.    Past Medical History:  Diagnosis Date  . Alcohol abuse   . Anemia   . Anxiety   . Blood transfusion without reported diagnosis   . Depression   . Hemorrhoid   . Hypertension   . Migraine     Patient Active Problem List   Diagnosis Date Noted  . Chronic right shoulder pain 09/04/2016  . Hypokalemia 04/30/2016  . Hypomagnesemia 04/30/2016  . Alcoholic hepatitis 04/30/2016  . Alcohol induced fatty liver 04/30/2016  . Alcohol withdrawal (HCC) 04/29/2016  . Alcohol abuse 04/29/2016  . Left medial knee pain 06/29/2015  . Acute bacterial sinusitis 10/11/2014  . Maxillary sinusitis, acute 09/14/2014  . Gastroenteritis 11/09/2013  .  Pleuritic chest pain 09/17/2013  . Mood disorder (HCC) 07/26/2013  . Chest pain 11/18/2012  . Morbid obesity (HCC) 09/29/2012  . HTN (hypertension) 07/12/2012  . Migraine 07/12/2012  . Bipolar II disorder, most recent episode major depressive (HCC) 07/11/2012    Past Surgical History:  Procedure Laterality Date  . HEMORRHOID SURGERY     Oct 2011  . HEMORRHOID SURGERY    . HERNIA REPAIR    . TONSILLECTOMY    . VENTRAL HERNIA REPAIR      OB History    No data available       Home Medications    Prior to Admission medications   Medication Sig Start Date End Date Taking? Authorizing Provider  amLODipine (NORVASC) 2.5 MG tablet TAKE 1 TABLET(2.5 MG) BY MOUTH DAILY FOR HIGH BLOOD PRESSURE 08/29/16   Sheliah Hatch, MD  buPROPion (WELLBUTRIN XL) 300 MG 24 hr tablet Take 300 mg by mouth daily.    [provider]  clonazePAM Scarlette Calico) 1 MG tablet  04/11/16   [provider]  cyclobenzaprine (FLEXERIL) 10 MG tablet Take 1 tablet (10 mg total) by mouth 3 (three) times daily as needed for muscle spasms. 09/11/16   Andrena Mews, DO  EQUETRO 300 MG CP12 TK ONE C PO D 04/23/16   [provider]  LORazepam (ATIVAN) 1 MG tablet Take 1 tablet (1 mg total) by mouth every 8 (eight) hours. 05/03/16   Sheliah Hatch, MD  meloxicam (MOBIC) 15 MG tablet Take 1 tablet (15 mg total)  by mouth daily. 07/18/16   Sheliah Hatch, MD  metoprolol succinate (TOPROL-XL) 25 MG 24 hr tablet TAKE 1 TABLET(25 MG) BY MOUTH DAILY 08/29/16   Sheliah Hatch, MD  ondansetron (ZOFRAN) 4 MG tablet Take 1 tablet (4 mg total) by mouth every 8 (eight) hours as needed for nausea or vomiting. 08/14/16   Sheliah Hatch, MD  PAZEO 0.7 % SOLN INT 1 GTT INTO OU ONCE A DAY PRN 04/15/16   [provider]  zolpidem (AMBIEN) 10 MG tablet Take 10 mg by mouth at bedtime as needed for sleep.  08/08/13   [provider]    Family History Family History  Problem Relation  Age of Onset  . Osteoarthritis Mother   . Diabetes Mother   . Hypertension Mother   . Depression Mother   . Hypertension Father   . Diabetes Father   . Asthma Son   . Deep vein thrombosis Maternal Grandmother   . Deep vein thrombosis Maternal Grandfather   . Stroke Neg Hx     Social History Social History  Substance Use Topics  . Smoking status: Former Smoker    Years: 10.00  . Smokeless tobacco: Never Used     Comment: smoked 1994-2012, up to 1 pp WEEK  . Alcohol use Yes     Comment: started drinking Vodka 2.5 mos ago when she lost job     Allergies   Toradol [ketorolac tromethamine]   Review of Systems Review of Systems  Constitutional: Negative for chills, diaphoresis, fatigue and fever.  HENT: Negative for congestion.   Eyes: Negative for photophobia and visual disturbance.  Respiratory: Negative for apnea, cough, chest tightness, shortness of breath and wheezing.   Cardiovascular: Negative for chest pain, palpitations, syncope and near-syncope.  Gastrointestinal: Positive for nausea. Negative for abdominal pain, anorexia, constipation, diarrhea and vomiting.  Genitourinary: Negative for dysuria, flank pain and frequency.  Musculoskeletal: Negative for back pain, neck pain and neck stiffness.  Skin: Positive for color change (bruising on forehead). Negative for wound.  Neurological: Positive for dizziness, speech difficulty, light-headedness and headaches. Negative for seizures, syncope, facial asymmetry and weakness.  Psychiatric/Behavioral: Negative for agitation and confusion.  All other systems reviewed and are negative.    Physical Exam Updated Vital Signs BP 108/71 (BP Location: Right Arm)   Pulse 85   Temp 98.2 F (36.8 C) (Oral)   Resp 18   Ht 5' (1.524 m)   Wt 129.3 kg (285 lb)   SpO2 100%   BMI 55.66 kg/m   Physical Exam  Constitutional: She is oriented to person, place, and time. She appears well-developed and well-nourished. No distress.    HENT:  Head: Head is with contusion. Head is without raccoon's eyes, without Battle's sign, without abrasion and without laceration. Hair is normal.    Nose: Nose normal.  Mouth/Throat: Oropharynx is clear and moist. No oropharyngeal exudate.  Eyes: Pupils are equal, round, and reactive to light. Conjunctivae and EOM are normal.  Neck: Normal range of motion. Neck supple.  Cardiovascular: Normal rate and intact distal pulses.   No murmur heard. Pulmonary/Chest: Effort normal and breath sounds normal. No stridor. No respiratory distress. She has no wheezes. She exhibits no tenderness.  Abdominal: Soft. There is no tenderness. There is no guarding.  Musculoskeletal: She exhibits tenderness. She exhibits no edema.  Neurological: She is alert and oriented to person, place, and time. She is not disoriented. She displays no tremor. No cranial nerve deficit or sensory  deficit. She exhibits normal muscle tone. Coordination normal. GCS eye subscore is 4. GCS verbal subscore is 5. GCS motor subscore is 6.  Skin: Skin is warm and dry. Capillary refill takes less than 2 seconds. No rash noted. She is not diaphoretic. No erythema.  Psychiatric: She has a normal mood and affect.  Nursing note and vitals reviewed.    ED Treatments / Results  Labs (all labs ordered are listed, but only abnormal results are displayed) Labs Reviewed - No data to display  EKG  EKG Interpretation None       Radiology Ct Head Wo Contrast  Result Date: 01/29/2017 CLINICAL DATA:  Patient fell 5 days ago and hit forehead. Dizziness and headaches since that time. EXAM: CT HEAD WITHOUT CONTRAST TECHNIQUE: Contiguous axial images were obtained from the base of the skull through the vertex without intravenous contrast. COMPARISON:  07/04/2012. FINDINGS: Brain: No evidence of acute infarction, hemorrhage, hydrocephalus, extra-axial collection or mass lesion/mass effect. Premature for age atrophy. Mild hypoattenuation of  white matter, suspected chronic microvascular ischemic change. Vascular: No hyperdense vessel or unexpected calcification. Skull: Calvarium intact.  No focal lesion. Sinuses/Orbits: No layering sinus fluid.  Unremarkable orbits. Other: Clear middle ear and mastoid compartments. IMPRESSION: Premature for age atrophy.  Suspected early white matter disease. No acute intracranial findings. Progressive volume loss of the brain compared with 2014. Electronically Signed   By: Elsie Stain M.D.   On: 01/29/2017 08:39    Procedures Procedures (including critical care time)  Medications Ordered in ED Medications  prochlorperazine (COMPAZINE) injection 10 mg (10 mg Intravenous Given 01/29/17 0843)  diphenhydrAMINE (BENADRYL) injection 25 mg (25 mg Intravenous Given 01/29/17 0843)     Initial Impression / Assessment and Plan / ED Course  I have reviewed the triage vital signs and the nursing notes.  Pertinent labs & imaging results that were available during my care of the patient were reviewed by me and considered in my medical decision making (see chart for details).     Marilyn Fowler is a 41 y.o. female with a past medical history significant for hypertension, bipolar disorder, migraines, and depression who presents with headache after a fall. Patient reports that 5 days ago, she was cleaning her bathtub when she had a fall striking her forehead onto the edge of the tub. She denies loss of consciousness but reported immediate onset of pain. She reports she initially had a bruise and bump on her head that has since resolved. There is still ecchymosis seen. Patient said since that time she's been having headaches with some nausea. She reports some forgetfulness and some difficulty "putting words together" but otherwise denies any syncopal episodes. She denies any vision changes or vomiting. She denies any difficulty with ambulation but reports that she feels lightheaded when she stands too quickly. She  denies chest pain, shortness of breath, neck pain, neck stiffness, or other complaints. She has had no relief with Tylenol at home. Patient reports her symptoms feel different than her normal migraines as she has no photophobia or phonophobia.  On exam, no focal neurologic deficits seen. Normal extraocular movements and normal pupil exam. No facial droop. Asian had no slurred speech or abnormal speech on my exam. Normal sensation in all history is in the face.  Normal coordination with finger-nose-finger. Normal grip strength. Lungs are clear and chest is nontender. Exam otherwise unremarkable.  Based on symptoms, suspect a postconcussive headache and syndrome. However, patient will have CT head to rule out hemorrhagic  head injury. Patient will be given a headache cocktail during workup to alleviate her headache which is approximately 8 out of 10 at this time.  Anticipate reassessment following imaging.  10:10 AM CT imaging showed no acute fracture or bleed. There was evidence of premature cerebral atrophy and early white matter disease. Patient was informed of these findings of atrophy progressing since 2014.  Patient instructed to follow-up with PCP for the brain atrophy. Suspect concussion as etiology of patient's symptoms. Patient will be a prescription for nausea medication and a work note for today. Patient will follow-up with PCP in several days for reassessment of a concussion. Patient understood return precautions and had no depressions or concerns. Patient discharged in good condition.    Final Clinical Impressions(s) / ED Diagnoses   Final diagnoses:  Post-traumatic headache, not intractable, unspecified chronicity pattern  Concussion without loss of consciousness, initial encounter    New Prescriptions New Prescriptions   PROCHLORPERAZINE (COMPAZINE) 10 MG TABLET    Take 1 tablet (10 mg total) by mouth 2 (two) times daily as needed for nausea or vomiting.    Clinical  Impression: 1. Post-traumatic headache, not intractable, unspecified chronicity pattern   2. Concussion without loss of consciousness, initial encounter     Disposition: Discharge  Condition: Good  I have discussed the results, Dx and Tx plan with the pt(& family if present). He/she/they expressed understanding and agree(s) with the plan. Discharge instructions discussed at great length. Strict return precautions discussed and pt &/or family have verbalized understanding of the instructions. No further questions at time of discharge.    New Prescriptions   PROCHLORPERAZINE (COMPAZINE) 10 MG TABLET    Take 1 tablet (10 mg total) by mouth 2 (two) times daily as needed for nausea or vomiting.    Follow Up: Sheliah Hatch, MD [address] [place] [address] [phone]  Schedule an appointment as soon as possible for a visit    [facility] [address] [id number] mc [address] [place] [address] [phone]  If symptoms worsen     Davyn Morandi, Canary Brim, MD 01/29/17 [phone]

## 2017-02-03 ENCOUNTER — Telehealth: Payer: Self-pay | Admitting: Family Medicine

## 2017-02-03 NOTE — Telephone Encounter (Signed)
Provider informed, will keep checking to ensure pt goes to ED.

## 2017-02-03 NOTE — Telephone Encounter (Signed)
Patient Name: Marilyn Fowler DOB: 07-Apr-1976 Initial Comment Caller states having chest pain, hurts to breathe Nurse Assessment Nurse: Charna Elizabeth, RN, Marilyn Fowler Date/Time (Eastern Time): 02/03/2017 1:25:57 PM Confirm and document reason for call. If symptomatic, describe symptoms. ---Caller states she developed moderate left chest pain about 3-4 days ago that is worse when she takes a deep breath in. No injury. No severe breathing difficulty. No fever. Alert and responsive. Does the patient have any new or worsening symptoms? ---Yes Will a triage be completed? ---Yes Related visit to physician within the last 2 weeks? ---No Does the PT have any chronic conditions? (i.e. diabetes, asthma, etc.) ---Yes List chronic conditions. ---Obesity, High Blood Pressure, Bipolar, Pleurisy in the past Is the patient pregnant or possibly pregnant? (Ask all females between the ages of 92-55) ---No Is this a behavioral health or substance abuse call? ---No Guidelines Guideline Title Affirmed Question Affirmed Notes Chest Pain Taking a deep breath makes pain worse Final Disposition User Go to ED Now (or PCP triage) Charna Elizabeth, RN, Cathy Referrals [facility] - ED Caller Disagree/Comply Comply Caller Understands Yes PreDisposition Call Doctor

## 2017-02-04 ENCOUNTER — Emergency Department (HOSPITAL_BASED_OUTPATIENT_CLINIC_OR_DEPARTMENT_OTHER)
Admission: EM | Admit: 2017-02-04 | Discharge: 2017-02-04 | Disposition: A | Discharge status: Home / Self Care | Payer: 59 | Attending: Emergency Medicine | Admitting: Emergency Medicine

## 2017-02-04 ENCOUNTER — Emergency Department (HOSPITAL_BASED_OUTPATIENT_CLINIC_OR_DEPARTMENT_OTHER): Payer: 59

## 2017-02-04 ENCOUNTER — Encounter (HOSPITAL_BASED_OUTPATIENT_CLINIC_OR_DEPARTMENT_OTHER): Payer: Self-pay | Admitting: *Deleted

## 2017-02-04 DIAGNOSIS — Y939 Activity, unspecified: Secondary | ICD-10-CM | POA: Diagnosis not present

## 2017-02-04 DIAGNOSIS — R51 Headache: Secondary | ICD-10-CM | POA: Insufficient documentation

## 2017-02-04 DIAGNOSIS — W19XXXA Unspecified fall, initial encounter: Secondary | ICD-10-CM

## 2017-02-04 DIAGNOSIS — S0990XA Unspecified injury of head, initial encounter: Secondary | ICD-10-CM | POA: Diagnosis not present

## 2017-02-04 DIAGNOSIS — W1839XA Other fall on same level, initial encounter: Secondary | ICD-10-CM | POA: Insufficient documentation

## 2017-02-04 DIAGNOSIS — Z87891 Personal history of nicotine dependence: Secondary | ICD-10-CM | POA: Diagnosis not present

## 2017-02-04 DIAGNOSIS — I10 Essential (primary) hypertension: Secondary | ICD-10-CM | POA: Diagnosis not present

## 2017-02-04 DIAGNOSIS — Z79899 Other long term (current) drug therapy: Secondary | ICD-10-CM | POA: Diagnosis not present

## 2017-02-04 DIAGNOSIS — S098XXA Other specified injuries of head, initial encounter: Secondary | ICD-10-CM | POA: Diagnosis not present

## 2017-02-04 DIAGNOSIS — R42 Dizziness and giddiness: Secondary | ICD-10-CM | POA: Diagnosis not present

## 2017-02-04 DIAGNOSIS — Y929 Unspecified place or not applicable: Secondary | ICD-10-CM | POA: Diagnosis not present

## 2017-02-04 DIAGNOSIS — R112 Nausea with vomiting, unspecified: Secondary | ICD-10-CM | POA: Insufficient documentation

## 2017-02-04 DIAGNOSIS — Y999 Unspecified external cause status: Secondary | ICD-10-CM | POA: Insufficient documentation

## 2017-02-04 LAB — CBC WITH DIFFERENTIAL/PLATELET
BASOS ABS: 0 10*3/uL (ref 0.0–0.1)
Basophils Relative: 1 %
EOS ABS: 0.1 10*3/uL (ref 0.0–0.7)
EOS PCT: 1 %
HCT: 36.8 % (ref 36.0–46.0)
Hemoglobin: 12.6 g/dL (ref 12.0–15.0)
LYMPHS PCT: 34 %
Lymphs Abs: 2.6 10*3/uL (ref 0.7–4.0)
MCH: 29.7 pg (ref 26.0–34.0)
MCHC: 34.2 g/dL (ref 30.0–36.0)
MCV: 86.8 fL (ref 78.0–100.0)
MONO ABS: 0.6 10*3/uL (ref 0.1–1.0)
Monocytes Relative: 8 %
Neutro Abs: 4.4 10*3/uL (ref 1.7–7.7)
Neutrophils Relative %: 57 %
PLATELETS: 300 10*3/uL (ref 150–400)
RBC: 4.24 MIL/uL (ref 3.87–5.11)
RDW: 13.4 % (ref 11.5–15.5)
WBC: 7.7 10*3/uL (ref 4.0–10.5)

## 2017-02-04 LAB — BASIC METABOLIC PANEL
ANION GAP: 9 (ref 5–15)
BUN: 7 mg/dL (ref 6–20)
CALCIUM: 9.2 mg/dL (ref 8.9–10.3)
CO2: 23 mmol/L (ref 22–32)
Chloride: 106 mmol/L (ref 101–111)
Creatinine, Ser: 0.82 mg/dL (ref 0.44–1.00)
GFR calc Af Amer: >60 mL/min (ref >60)
GLUCOSE: 126 mg/dL — AB (ref 65–99)
Potassium: 3.5 mmol/L (ref 3.5–5.1)
Sodium: 138 mmol/L (ref 135–145)

## 2017-02-04 NOTE — ED Notes (Signed)
Patient transported to CT 

## 2017-02-04 NOTE — ED Notes (Signed)
Pt on monitor 

## 2017-02-04 NOTE — ED Notes (Signed)
Given po fluids 

## 2017-02-04 NOTE — ED Provider Notes (Signed)
[facility]   CSN: [id number] Arrival date & time: 02/04/17  0640     History   Chief Complaint Chief Complaint  Patient presents with  . Fall    HPI Marilyn Fowler is a 41 y.o. female.  Patient just seen for fall and head injury on October 3. Patient got dizzy when she stood up this morning and fell hitting the back of her head. There are head on door frame. She had 2 episodes of vomiting right after the fall. No complaint of any other injuries. Patient states there is a lump on the back of her head. Patient on her visit on October 3 from the fall and injury then did have a negative head CT. Labs were not done at that time.  Dizziness has resolved.      Past Medical History:  Diagnosis Date  . Alcohol abuse   . Anemia   . Anxiety   . Blood transfusion without reported diagnosis   . Depression   . Hemorrhoid   . Hypertension   . Migraine     Patient Active Problem List   Diagnosis Date Noted  . Chronic right shoulder pain 09/04/2016  . Hypokalemia 04/30/2016  . Hypomagnesemia 04/30/2016  . Alcoholic hepatitis 04/30/2016  . Alcohol induced fatty liver 04/30/2016  . Alcohol withdrawal (HCC) 04/29/2016  . Alcohol abuse 04/29/2016  . Left medial knee pain 06/29/2015  . Acute bacterial sinusitis 10/11/2014  . Maxillary sinusitis, acute 09/14/2014  . Gastroenteritis 11/09/2013  . Pleuritic chest pain 09/17/2013  . Mood disorder (HCC) 07/26/2013  . Chest pain 11/18/2012  . Morbid obesity (HCC) 09/29/2012  . HTN (hypertension) 07/12/2012  . Migraine 07/12/2012  . Bipolar II disorder, most recent episode major depressive (HCC) 07/11/2012    Past Surgical History:  Procedure Laterality Date  . HEMORRHOID SURGERY     Oct 2011  . HEMORRHOID SURGERY    . HERNIA REPAIR    . TONSILLECTOMY    . VENTRAL HERNIA REPAIR      OB History    No data available       Home Medications    Prior to Admission medications   Medication Sig  Start Date End Date Taking? Authorizing Provider  amLODipine (NORVASC) 2.5 MG tablet TAKE 1 TABLET(2.5 MG) BY MOUTH DAILY FOR HIGH BLOOD PRESSURE 08/29/16   Sheliah Hatch, MD  buPROPion (WELLBUTRIN XL) 300 MG 24 hr tablet Take 300 mg by mouth daily.    [provider]  clonazePAM (KLONOPIN) 1 MG tablet 3 (three) times daily as needed.  04/11/16   [provider]  cyclobenzaprine (FLEXERIL) 10 MG tablet Take 1 tablet (10 mg total) by mouth 3 (three) times daily as needed for muscle spasms. 09/11/16   Andrena Mews, DO  EQUETRO 300 MG CP12 TK ONE C PO D 04/23/16   [provider]  LORazepam (ATIVAN) 1 MG tablet Take 1 tablet (1 mg total) by mouth every 8 (eight) hours. 05/03/16   Sheliah Hatch, MD  meloxicam (MOBIC) 15 MG tablet Take 1 tablet (15 mg total) by mouth daily. 07/18/16   Sheliah Hatch, MD  metoprolol succinate (TOPROL-XL) 25 MG 24 hr tablet TAKE 1 TABLET(25 MG) BY MOUTH DAILY 08/29/16   Sheliah Hatch, MD  ondansetron (ZOFRAN) 4 MG tablet Take 1 tablet (4 mg total) by mouth every 8 (eight) hours as needed for nausea or vomiting. 08/14/16   Sheliah Hatch, MD  PAZEO 0.7 %  SOLN INT 1 GTT INTO OU ONCE A DAY PRN 04/15/16   [provider]  prochlorperazine (COMPAZINE) 10 MG tablet Take 1 tablet (10 mg total) by mouth 2 (two) times daily as needed for nausea or vomiting. 01/29/17   Tegeler, Canary Brim, MD  zolpidem (AMBIEN) 10 MG tablet Take 10 mg by mouth at bedtime as needed for sleep.  08/08/13   [provider]    Family History Family History  Problem Relation Age of Onset  . Osteoarthritis Mother   . Diabetes Mother   . Hypertension Mother   . Depression Mother   . Hypertension Father   . Diabetes Father   . Asthma Son   . Deep vein thrombosis Maternal Grandmother   . Deep vein thrombosis Maternal Grandfather   . Stroke Neg Hx     Social History Social History  Substance Use Topics  . Smoking status:  Former Smoker    Years: 10.00  . Smokeless tobacco: Never Used     Comment: smoked 1994-2012, up to 1 pp WEEK  . Alcohol use No     Comment: last used ETOH in Jan 2018     Allergies   Toradol [ketorolac tromethamine]   Review of Systems Review of Systems  Constitutional: Negative for fever.  HENT: Negative for congestion.   Eyes: Negative for visual disturbance.  Respiratory: Negative for shortness of breath.   Gastrointestinal: Positive for nausea and vomiting. Negative for abdominal pain.  Genitourinary: Negative for dysuria.  Musculoskeletal: Negative for back pain and neck pain.  Skin: Negative for rash.  Neurological: Positive for dizziness, syncope and headaches.  Hematological: Does not bruise/bleed easily.  Psychiatric/Behavioral: Negative for confusion.     Physical Exam Updated Vital Signs BP (!) 136/93 (BP Location: Right Arm)   Pulse 96   Temp 98 F (36.7 C) (Oral)   Resp 16   Ht 1.524 m (5')   Wt 132 kg (291 lb)   LMP 01/27/2017   SpO2 96%   BMI 56.83 kg/m   Physical Exam  Constitutional: She is oriented to person, place, and time. She appears well-developed and well-nourished. No distress.  HENT:  Head: Normocephalic.  Mouth/Throat: Oropharynx is clear and moist.  Posterior part of the scalp with a lump that measures about 2 x 2 centimeters no laceration no bleeding.  Eyes: Pupils are equal, round, and reactive to light. Conjunctivae and EOM are normal.  Neck: Normal range of motion. Neck supple.  Posterior cervical spine nontender to palpation. Good range of motion.  Cardiovascular: Normal rate, regular rhythm and normal heart sounds.   Pulmonary/Chest: Effort normal and breath sounds normal.  Abdominal: Soft. Bowel sounds are normal. There is no tenderness.  Musculoskeletal: Normal range of motion.  Neurological: She is alert and oriented to person, place, and time. No cranial nerve deficit or sensory deficit. She exhibits normal muscle tone.  Coordination normal.  Skin: Skin is warm.  Nursing note and vitals reviewed.    ED Treatments / Results  Labs (all labs ordered are listed, but only abnormal results are displayed) Labs Reviewed  BASIC METABOLIC PANEL - Abnormal; Notable for the following:       Result Value   Glucose, Bld 126 (*)    All other components within normal limits  CBC WITH DIFFERENTIAL/PLATELET    EKG  EKG Interpretation  Date/Time:  Tuesday February 04 2017 07:50:53 EDT Ventricular Rate:  95 PR Interval:    QRS Duration: 88 QT Interval:  377  QTC Calculation: 474 R Axis:   -15 Text Interpretation:  Sinus rhythm Borderline left axis deviation Low voltage, precordial leads Consider anterior infarct Confirmed by Vanetta Mulders [phone]) on 02/04/2017 8:07:14 AM       Radiology Ct Head Wo Contrast  Result Date: 02/04/2017 CLINICAL DATA:  Dizziness, fall hitting back of head. EXAM: CT HEAD WITHOUT CONTRAST TECHNIQUE: Contiguous axial images were obtained from the base of the skull through the vertex without intravenous contrast. COMPARISON:  01/29/2017 FINDINGS: Brain: Age advanced cerebral atrophy, stable. No acute intracranial abnormality. Specifically, no hemorrhage, hydrocephalus, mass lesion, acute infarction, or significant intracranial injury. Vascular: No hyperdense vessel or unexpected calcification. Skull: No acute calvarial abnormality. Sinuses/Orbits: Visualized paranasal sinuses and mastoids clear. Orbital soft tissues unremarkable. Other: None IMPRESSION: Premature for age cerebral atrophy. No acute intracranial abnormality or change. Electronically Signed   By: Charlett Nose M.D.   On: 02/04/2017 07:45    Procedures Procedures (including critical care time)  Medications Ordered in ED Medications - No data to display   Initial Impression / Assessment and Plan / ED Course  I have reviewed the triage vital signs and the nursing notes.  Pertinent labs & imaging results that were  available during my care of the patient were reviewed by me and considered in my medical decision making (see chart for details).    Patient with recurrent fall with head injury head CT negative. Today's labs without a sniffing abnormalities. Cardiac monitoring and EKG without any evidence of arrhythmias. Symptoms had resolved patient had improved. Patient refused a pregnancy test. Patient aware that that could be responsible for the dizziness and near syncopal episode.   Final Clinical Impressions(s) / ED Diagnoses   Final diagnoses:  Fall, initial encounter  Injury of head, initial encounter    New Prescriptions New Prescriptions   No medications on file     Vanetta Mulders, MD 02/04/17 1007

## 2017-02-04 NOTE — Telephone Encounter (Signed)
Noted  

## 2017-02-04 NOTE — Telephone Encounter (Signed)
Pt is in ED today

## 2017-02-04 NOTE — Discharge Instructions (Signed)
Follow-up with your primary care doctor. Head CT shows no skull or brain injury. Labs without any significant abnormality. Pregnancy test was not verified as per your request. Return for any new or worse symptoms.

## 2017-02-04 NOTE — ED Triage Notes (Signed)
Pt reports that she got dizzy again this am and fell hitting the back of her head on the door frame. States that she had 2 episodes of vomiting right after fall

## 2017-02-04 NOTE — ED Notes (Signed)
Was d/cing the patient and she asked for further time off from work " because what if I wake up tomorrow and I am still dizzy"  - patient was advised that because the dr had written the note that I could not change it. I offered to discuss with dr and she did not want. The patients pain was reevaluated and she states " Well since you didn't give me nothing not even a tylenol for my pain, and I have a knot on my head and I am dizzy" - I offered to have the charge nurse talk to the patient to express her concerns. The patient states " no - mom lets get out of here" - Patient d/c'd

## 2017-02-04 NOTE — ED Notes (Signed)
Pt approached me after discharge.. "are the charge nurse"? I asked patient if I could do anything for her. Patient states "No, they dont care anyways".

## 2017-02-04 NOTE — ED Notes (Signed)
Refused serum hCG, states just had a period.

## 2017-02-14 ENCOUNTER — Other Ambulatory Visit: Payer: Self-pay | Admitting: Sports Medicine

## 2017-02-17 ENCOUNTER — Ambulatory Visit: Payer: 59 | Admitting: Family Medicine

## 2017-04-15 ENCOUNTER — Encounter: Payer: Self-pay | Admitting: Family Medicine

## 2017-04-15 ENCOUNTER — Ambulatory Visit: Payer: 59 | Admitting: Family Medicine

## 2017-04-15 ENCOUNTER — Other Ambulatory Visit: Payer: Self-pay

## 2017-04-15 VITALS — BP 124/84 | HR 80 | Temp 98.1°F | Resp 16 | Ht 60.0 in | Wt 278.4 lb

## 2017-04-15 DIAGNOSIS — J329 Chronic sinusitis, unspecified: Secondary | ICD-10-CM

## 2017-04-15 DIAGNOSIS — B9689 Other specified bacterial agents as the cause of diseases classified elsewhere: Secondary | ICD-10-CM | POA: Diagnosis not present

## 2017-04-15 MED ORDER — AMOXICILLIN 875 MG PO TABS: 875.0000 mg | ORAL_TABLET | Freq: Two times a day (BID) | ORAL | 0 refills | Status: DC

## 2017-04-15 NOTE — Progress Notes (Signed)
   Subjective:    Patient ID: Marilyn Fowler, female    DOB: [birth year], 41 y.o.   MRN: [id number]  HPI URI- pt reports 'i've just been sick' for ~8 days.  Fever for the 1st 2 days, none since.  + maxillary sinus pain, nasal congestion.  + tooth pain.  No ear pain.  + neck soreness.  Minimal cough.  + sore throat.  + sick contacts.  No N/V.   Review of Systems For ROS see HPI     Objective:   Physical Exam  Constitutional: She appears well-developed and well-nourished. No distress.  HENT:  Head: Normocephalic and atraumatic.  Right Ear: Tympanic membrane normal.  Left Ear: Tympanic membrane normal.  Nose: Mucosal edema and rhinorrhea present. Right sinus exhibits maxillary sinus tenderness. Right sinus exhibits no frontal sinus tenderness. Left sinus exhibits maxillary sinus tenderness. Left sinus exhibits no frontal sinus tenderness.  Mouth/Throat: Uvula is midline and mucous membranes are normal. Posterior oropharyngeal erythema present. No oropharyngeal exudate.  Eyes: Conjunctivae and EOM are normal. Pupils are equal, round, and reactive to light.  Neck: Normal range of motion. Neck supple.  Cardiovascular: Normal rate, regular rhythm and normal heart sounds.  Pulmonary/Chest: Effort normal and breath sounds normal. No respiratory distress. She has no wheezes.  Lymphadenopathy:    She has no cervical adenopathy.  Vitals reviewed.         Assessment & Plan:  Bacterial sinusitis- pt's sxs and PE consistent w/ infxn.  Start abx.  Reviewed supportive care and red flags that should prompt return.  Pt expressed understanding and is in agreement w/ plan.

## 2017-04-15 NOTE — Patient Instructions (Signed)
Schedule your complete physical in 3-4 months Start the Amoxicillin twice daily- take w/ food Drink plenty of fluids Mucinex to thin your congestion REST! Call with any questions or concerns Hang in there! Happy Holidays!!!

## 2017-06-26 ENCOUNTER — Ambulatory Visit: Payer: 59 | Admitting: Physician Assistant

## 2017-06-26 ENCOUNTER — Other Ambulatory Visit: Payer: Self-pay

## 2017-06-26 ENCOUNTER — Encounter: Payer: Self-pay | Admitting: Physician Assistant

## 2017-06-26 ENCOUNTER — Encounter: Payer: Self-pay | Admitting: Emergency Medicine

## 2017-06-26 VITALS — BP 118/84 | HR 94 | Temp 98.2°F | Resp 16 | Ht 60.0 in | Wt 290.0 lb

## 2017-06-26 DIAGNOSIS — K529 Noninfective gastroenteritis and colitis, unspecified: Secondary | ICD-10-CM

## 2017-06-26 MED ORDER — ONDANSETRON HCL 4 MG PO TABS: 4.0000 mg | ORAL_TABLET | Freq: Three times a day (TID) | ORAL | 0 refills | Status: DC | PRN

## 2017-06-26 NOTE — Patient Instructions (Signed)
Please stay well-hydrated and get plenty of rest.  Follow the dietary recommendations below. Take the Zofran as directed.  Symptoms should slowly resolve over the next few days.   Food Choices to Help Relieve Diarrhea, Adult When you have diarrhea, the foods you eat and your eating habits are very important. Choosing the right foods and drinks can help:  Relieve diarrhea.  Replace lost fluids and nutrients.  Prevent dehydration.  What general guidelines should I follow? Relieving diarrhea  Choose foods with less than 2 g or .07 oz. of fiber per serving.  Limit fats to less than 8 tsp (38 g or 1.34 oz.) a day.  Avoid the following: ? Foods and beverages sweetened with high-fructose corn syrup, honey, or sugar alcohols such as xylitol, sorbitol, and mannitol. ? Foods that contain a lot of fat or sugar. ? Fried, greasy, or spicy foods. ? High-fiber grains, breads, and cereals. ? Raw fruits and vegetables.  Eat foods that are rich in probiotics. These foods include dairy products such as yogurt and fermented milk products. They help increase healthy bacteria in the stomach and intestines (gastrointestinal tract, or GI tract).  If you have lactose intolerance, avoid dairy products. These may make your diarrhea worse.  Take medicine to help stop diarrhea (antidiarrheal medicine) only as told by your health care provider. Replacing nutrients  Eat small meals or snacks every 3-4 hours.  Eat bland foods, such as white rice, toast, or baked potato, until your diarrhea starts to get better. Gradually reintroduce nutrient-rich foods as tolerated or as told by your health care provider. This includes: ? Well-cooked protein foods. ? Peeled, seeded, and soft-cooked fruits and vegetables. ? Low-fat dairy products.  Take vitamin and mineral supplements as told by your health care provider. Preventing dehydration   Start by sipping water or a special solution to prevent dehydration (oral  rehydration solution, ORS). Urine that is clear or pale yellow means that you are getting enough fluid.  Try to drink at least 8-10 cups of fluid each day to help replace lost fluids.  You may add other liquids in addition to water, such as clear juice or decaffeinated sports drinks, as tolerated or as told by your health care provider.  Avoid drinks with caffeine, such as coffee, tea, or soft drinks.  Avoid alcohol. What foods are recommended? The items listed may not be a complete list. Talk with your health care provider about what dietary choices are best for you. Grains White rice. White, JamaicaFrench, or pita breads (fresh or toasted), including plain rolls, buns, or bagels. White pasta. Saltine, soda, or graham crackers. Pretzels. Low-fiber cereal. Cooked cereals made with water (such as cornmeal, farina, or cream cereals). Plain muffins. Matzo. Melba toast. Zwieback. Vegetables Potatoes (without the skin). Most well-cooked and canned vegetables without skins or seeds. Tender lettuce. Fruits Apple sauce. Fruits canned in juice. Cooked apricots, cherries, grapefruit, peaches, pears, or plums. Fresh bananas and cantaloupe. Meats and other protein foods Baked or boiled chicken. Eggs. Tofu. Fish. Seafood. Smooth nut butters. Ground or well-cooked tender beef, ham, veal, lamb, pork, or poultry. Dairy Plain yogurt, kefir, and unsweetened liquid yogurt. Lactose-free milk, buttermilk, skim milk, or soy milk. Low-fat or nonfat hard cheese. Beverages Water. Low-calorie sports drinks. Fruit juices without pulp. Strained tomato and vegetable juices. Decaffeinated teas. Sugar-free beverages not sweetened with sugar alcohols. Oral rehydration solutions, if approved by your health care provider. Seasoning and other foods Bouillon, broth, or soups made from recommended foods. What foods  are not recommended? The items listed may not be a complete list. Talk with your health care provider about what dietary  choices are best for you. Grains Whole grain, whole wheat, bran, or rye breads, rolls, pastas, and crackers. Wild or brown rice. Whole grain or bran cereals. Barley. Oats and oatmeal. Corn tortillas or taco shells. Granola. Popcorn. Vegetables Raw vegetables. Fried vegetables. Cabbage, broccoli, Brussels sprouts, artichokes, baked beans, beet greens, corn, kale, legumes, peas, sweet potatoes, and yams. Potato skins. Cooked spinach and cabbage. Fruits Dried fruit, including raisins and dates. Raw fruits. Stewed or dried prunes. Canned fruits with syrup. Meat and other protein foods Fried or fatty meats. Deli meats. Chunky nut butters. Nuts and seeds. Beans and lentils. Tomasa Blase. Hot dogs. Sausage. Dairy High-fat cheeses. Whole milk, chocolate milk, and beverages made with milk, such as milk shakes. Half-and-half. Cream. sour cream. Ice cream. Beverages Caffeinated beverages (such as coffee, tea, soda, or energy drinks). Alcoholic beverages. Fruit juices with pulp. Prune juice. Soft drinks sweetened with high-fructose corn syrup or sugar alcohols. High-calorie sports drinks. Fats and oils Butter. Cream sauces. Margarine. Salad oils. Plain salad dressings. Olives. Avocados. Mayonnaise. Sweets and desserts Sweet rolls, doughnuts, and sweet breads. Sugar-free desserts sweetened with sugar alcohols such as xylitol and sorbitol. Seasoning and other foods Honey. Hot sauce. Chili powder. Gravy. Cream-based or milk-based soups. Pancakes and waffles. Summary  When you have diarrhea, the foods you eat and your eating habits are very important.  Make sure you get at least 8-10 cups of fluid each day, or enough to keep your urine clear or pale yellow.  Eat bland foods and gradually reintroduce healthy, nutrient-rich foods as tolerated, or as told by your health care provider.  Avoid high-fiber, fried, greasy, or spicy foods. This information is not intended to replace advice given to you by your health  care provider. Make sure you discuss any questions you have with your health care provider. Document Released: 07/06/2003 Document Revised: 04/12/2016 Document Reviewed: 04/12/2016 Elsevier Interactive Patient Education  [employer].

## 2017-06-26 NOTE — Progress Notes (Signed)
Patient presents to clinic today c/o 2 days of nausea with loose stools and episodes of non-bloody emesis. Has had a couple of esisodes yesterday of emesis. Is hydrating well. Has not had much of an appetite. Denies fever, chills. Notes mild aches. Denies recent travel, sick contact or abnormal food/water source.   Past Medical History:  Diagnosis Date  . Alcohol abuse   . Anemia   . Anxiety   . Blood transfusion without reported diagnosis   . Depression   . Hemorrhoid   . Hypertension   . Migraine     Current Outpatient Medications on File Prior to Visit  Medication Sig Dispense Refill  . amLODipine (NORVASC) 2.5 MG tablet TAKE 1 TABLET(2.5 MG) BY MOUTH DAILY FOR HIGH BLOOD PRESSURE 30 tablet 6  . buPROPion (WELLBUTRIN XL) 300 MG 24 hr tablet Take 300 mg by mouth daily.    . clonazePAM (KLONOPIN) 1 MG tablet 3 (three) times daily as needed.   0  . EQUETRO 300 MG CP12 TK ONE C PO D  1  . metoprolol succinate (TOPROL-XL) 25 MG 24 hr tablet TAKE 1 TABLET(25 MG) BY MOUTH DAILY 30 tablet 6  . traZODone (DESYREL) 100 MG tablet TK 1 1/2 TS PO QHS  0   No current facility-administered medications on file prior to visit.     Allergies  Allergen Reactions  . Toradol [Ketorolac Tromethamine]     Pt states it makes her jittery, felt she had something crawling on her     Family History  Problem Relation Age of Onset  . Osteoarthritis Mother   . Diabetes Mother   . Hypertension Mother   . Depression Mother   . Hypertension Father   . Diabetes Father   . Asthma Son   . Deep vein thrombosis Maternal Grandmother   . Deep vein thrombosis Maternal Grandfather   . Stroke Neg Hx     Social History   Socioeconomic History  . Marital status: Married    Spouse name: None  . Number of children: 2  . Years of education: None  . Highest education level: None  Social Needs  . Financial resource strain: None  . Food insecurity - worry: None  . Food insecurity - inability: None  .  Transportation needs - medical: None  . Transportation needs - non-medical: None  Occupational History    Employer: [employer]  Tobacco Use  . Smoking status: Former Smoker    Years: 10.00  . Smokeless tobacco: Never Used  . Tobacco comment: smoked 1994-2012, up to 1 pp WEEK  Substance and Sexual Activity  . Alcohol use: No    Comment: last used ETOH in Jan 2018  . Drug use: No  . Sexual activity: No    Birth control/protection: None  Other Topics Concern  . None  Social History Narrative   Separated, not on BCP       Review of Systems - See HPI.  All other ROS are negative.  BP 118/84   Pulse 94   Temp 98.2 F (36.8 C) (Oral)   Resp 16   Ht 5' (1.524 m)   Wt 290 lb (131.5 kg)   SpO2 95%   BMI 56.64 kg/m   Physical Exam  Constitutional: She is oriented to person, place, and time and well-developed, well-nourished, and in no distress.  HENT:  Head: Normocephalic and atraumatic.  Eyes: Conjunctivae are normal.  Neck: Neck supple.  Cardiovascular: Normal rate, regular rhythm and normal  heart sounds.  Pulmonary/Chest: Effort normal and breath sounds normal. No respiratory distress. She has no wheezes. She has no rales. She exhibits no tenderness.  Neurological: She is alert and oriented to person, place, and time.  Skin: Skin is warm and dry. No rash noted.  Psychiatric: Affect normal.  Vitals reviewed.  Assessment/Plan: 1. Gastroenteritis Suspected viral etiology. Supportive measures and OTC medications reviewed. Rx Zofran. BRAT diet reviewed. Follow-up discussed.   Marilyn Climes, PA-C

## 2017-06-30 ENCOUNTER — Observation Stay (HOSPITAL_BASED_OUTPATIENT_CLINIC_OR_DEPARTMENT_OTHER)
Admission: EM | Admit: 2017-06-30 | Discharge: 2017-07-01 | Disposition: A | Discharge status: Home / Self Care | Payer: 59 | Attending: Family Medicine | Admitting: Family Medicine

## 2017-06-30 ENCOUNTER — Other Ambulatory Visit: Payer: Self-pay

## 2017-06-30 ENCOUNTER — Observation Stay (HOSPITAL_COMMUNITY): Payer: 59

## 2017-06-30 ENCOUNTER — Encounter (HOSPITAL_BASED_OUTPATIENT_CLINIC_OR_DEPARTMENT_OTHER): Payer: Self-pay | Admitting: Emergency Medicine

## 2017-06-30 ENCOUNTER — Emergency Department (HOSPITAL_BASED_OUTPATIENT_CLINIC_OR_DEPARTMENT_OTHER): Payer: 59

## 2017-06-30 DIAGNOSIS — R200 Anesthesia of skin: Principal | ICD-10-CM | POA: Insufficient documentation

## 2017-06-30 DIAGNOSIS — G43501 Persistent migraine aura without cerebral infarction, not intractable, with status migrainosus: Secondary | ICD-10-CM | POA: Diagnosis not present

## 2017-06-30 DIAGNOSIS — H538 Other visual disturbances: Secondary | ICD-10-CM | POA: Diagnosis not present

## 2017-06-30 DIAGNOSIS — M6281 Muscle weakness (generalized): Secondary | ICD-10-CM | POA: Diagnosis not present

## 2017-06-30 DIAGNOSIS — R531 Weakness: Secondary | ICD-10-CM | POA: Diagnosis not present

## 2017-06-30 DIAGNOSIS — R51 Headache: Secondary | ICD-10-CM | POA: Diagnosis not present

## 2017-06-30 DIAGNOSIS — R9431 Abnormal electrocardiogram [ECG] [EKG]: Secondary | ICD-10-CM | POA: Diagnosis not present

## 2017-06-30 DIAGNOSIS — R519 Headache, unspecified: Secondary | ICD-10-CM | POA: Diagnosis present

## 2017-06-30 DIAGNOSIS — G43909 Migraine, unspecified, not intractable, without status migrainosus: Secondary | ICD-10-CM | POA: Insufficient documentation

## 2017-06-30 DIAGNOSIS — Z862 Personal history of diseases of the blood and blood-forming organs and certain disorders involving the immune mechanism: Secondary | ICD-10-CM | POA: Diagnosis not present

## 2017-06-30 DIAGNOSIS — I10 Essential (primary) hypertension: Secondary | ICD-10-CM | POA: Insufficient documentation

## 2017-06-30 DIAGNOSIS — Z87891 Personal history of nicotine dependence: Secondary | ICD-10-CM | POA: Insufficient documentation

## 2017-06-30 LAB — COMPREHENSIVE METABOLIC PANEL
ALK PHOS: 83 U/L (ref 38–126)
ALT: 20 U/L (ref 14–54)
AST: 27 U/L (ref 15–41)
Albumin: 3.8 g/dL (ref 3.5–5.0)
Anion gap: 10 (ref 5–15)
BILIRUBIN TOTAL: 0.3 mg/dL (ref 0.3–1.2)
BUN: 13 mg/dL (ref 6–20)
CALCIUM: 8.7 mg/dL — AB (ref 8.9–10.3)
CO2: 23 mmol/L (ref 22–32)
CREATININE: 0.81 mg/dL (ref 0.44–1.00)
Chloride: 105 mmol/L (ref 101–111)
GFR calc Af Amer: >60 mL/min (ref >60)
GFR calc non Af Amer: >60 mL/min (ref >60)
Glucose, Bld: 140 mg/dL — ABNORMAL HIGH (ref 65–99)
Potassium: 3.4 mmol/L — ABNORMAL LOW (ref 3.5–5.1)
Sodium: 138 mmol/L (ref 135–145)
TOTAL PROTEIN: 7.4 g/dL (ref 6.5–8.1)

## 2017-06-30 LAB — PROTIME-INR
INR: 0.95
Prothrombin Time: 12.6 seconds (ref 11.4–15.2)

## 2017-06-30 LAB — APTT: APTT: 32 s (ref 24–36)

## 2017-06-30 LAB — CREATININE, SERUM
Creatinine, Ser: 0.81 mg/dL (ref 0.44–1.00)
GFR calc Af Amer: >60 mL/min (ref >60)

## 2017-06-30 LAB — CBG MONITORING, ED: Glucose-Capillary: 128 mg/dL — ABNORMAL HIGH (ref 65–99)

## 2017-06-30 LAB — CBC
HEMATOCRIT: 38.9 % (ref 36.0–46.0)
HEMATOCRIT: 37.9 % (ref 36.0–46.0)
HEMOGLOBIN: 13.2 g/dL (ref 12.0–15.0)
HEMOGLOBIN: 12.7 g/dL (ref 12.0–15.0)
MCH: 29.1 pg (ref 26.0–34.0)
MCH: 28.7 pg (ref 26.0–34.0)
MCHC: 33.9 g/dL (ref 30.0–36.0)
MCHC: 33.5 g/dL (ref 30.0–36.0)
MCV: 85.7 fL (ref 78.0–100.0)
MCV: 85.6 fL (ref 78.0–100.0)
Platelets: 266 10*3/uL (ref 150–400)
Platelets: 292 10*3/uL (ref 150–400)
RBC: 4.54 MIL/uL (ref 3.87–5.11)
RBC: 4.43 MIL/uL (ref 3.87–5.11)
RDW: 14.3 % (ref 11.5–15.5)
RDW: 14.1 % (ref 11.5–15.5)
WBC: 7.8 10*3/uL (ref 4.0–10.5)
WBC: 9.1 10*3/uL (ref 4.0–10.5)

## 2017-06-30 LAB — DIFFERENTIAL
Basophils Absolute: 0 10*3/uL (ref 0.0–0.1)
Basophils Relative: 0 %
Eosinophils Absolute: 0.1 10*3/uL (ref 0.0–0.7)
Eosinophils Relative: 2 %
LYMPHS ABS: 2.6 10*3/uL (ref 0.7–4.0)
LYMPHS PCT: 34 %
MONOS PCT: 9 %
Monocytes Absolute: 0.7 10*3/uL (ref 0.1–1.0)
NEUTROS ABS: 4.3 10*3/uL (ref 1.7–7.7)
Neutrophils Relative %: 55 %

## 2017-06-30 LAB — HCG, QUANTITATIVE, PREGNANCY

## 2017-06-30 LAB — TROPONIN I: Troponin I: <0.03 ng/mL (ref <0.03)

## 2017-06-30 MED ORDER — SODIUM CHLORIDE 0.9 % IV BOLUS (SEPSIS)
1000.0000 mL | Freq: Once | INTRAVENOUS | Status: AC
  Administered 2017-06-30: 1000 mL via INTRAVENOUS

## 2017-06-30 MED ORDER — VALPROATE SODIUM 500 MG/5ML IV SOLN
1000.0000 mg | Freq: Once | INTRAVENOUS | Status: DC
  Filled 2017-06-30: qty 10

## 2017-06-30 MED ORDER — ISOMETHEPTENE-DICHLORAL-APAP 65-100-325 MG PO CAPS: 2.0000 | ORAL_CAPSULE | ORAL | Status: DC | PRN

## 2017-06-30 MED ORDER — METOCLOPRAMIDE HCL 5 MG/ML IJ SOLN
10.0000 mg | Freq: Three times a day (TID) | INTRAMUSCULAR | Status: DC
  Administered 2017-06-30 – 2017-07-01 (×2): 10 mg via INTRAVENOUS
  Filled 2017-06-30 (×2): qty 2

## 2017-06-30 MED ORDER — ONDANSETRON HCL 4 MG PO TABS
4.0000 mg | ORAL_TABLET | Freq: Three times a day (TID) | ORAL | Status: DC | PRN
  Administered 2017-07-01: 4 mg via ORAL
  Filled 2017-06-30: qty 1

## 2017-06-30 MED ORDER — DEXAMETHASONE SODIUM PHOSPHATE 10 MG/ML IJ SOLN
10.0000 mg | Freq: Once | INTRAMUSCULAR | Status: AC
Start: 2017-06-30 — End: 2017-06-30
  Administered 2017-06-30: 10 mg via INTRAVENOUS
  Filled 2017-06-30: qty 1

## 2017-06-30 MED ORDER — HYDROCODONE-ACETAMINOPHEN 5-325 MG PO TABS
1.0000 | ORAL_TABLET | ORAL | Status: DC | PRN
  Administered 2017-06-30 – 2017-07-01 (×2): 2 via ORAL
  Filled 2017-06-30 (×2): qty 2

## 2017-06-30 MED ORDER — BUPROPION HCL ER (XL) 150 MG PO TB24
300.0000 mg | ORAL_TABLET | Freq: Every day | ORAL | Status: DC
  Administered 2017-06-30 – 2017-07-01 (×2): 300 mg via ORAL
  Filled 2017-06-30 (×2): qty 2

## 2017-06-30 MED ORDER — ACETAMINOPHEN 500 MG PO TABS
ORAL_TABLET | ORAL | Status: AC
  Filled 2017-06-30: qty 2

## 2017-06-30 MED ORDER — AMLODIPINE BESYLATE 2.5 MG PO TABS
2.5000 mg | ORAL_TABLET | Freq: Every day | ORAL | Status: DC
  Administered 2017-06-30 – 2017-07-01 (×2): 2.5 mg via ORAL
  Filled 2017-06-30 (×2): qty 1

## 2017-06-30 MED ORDER — CARBAMAZEPINE ER 200 MG PO TB12
300.0000 mg | ORAL_TABLET | Freq: Every day | ORAL | Status: DC
  Administered 2017-06-30: 22:00:00 300 mg via ORAL
  Filled 2017-06-30: qty 1

## 2017-06-30 MED ORDER — PROCHLORPERAZINE MALEATE 10 MG PO TABS
10.0000 mg | ORAL_TABLET | Freq: Once | ORAL | Status: AC
  Administered 2017-06-30: 10 mg via ORAL
  Filled 2017-06-30: qty 1

## 2017-06-30 MED ORDER — ACETAMINOPHEN 500 MG PO TABS
1000.0000 mg | ORAL_TABLET | Freq: Once | ORAL | Status: AC
  Administered 2017-06-30: 1000 mg via ORAL

## 2017-06-30 MED ORDER — MAGNESIUM SULFATE 2 GM/50ML IV SOLN
2.0000 g | Freq: Once | INTRAVENOUS | Status: AC
  Administered 2017-06-30: 2 g via INTRAVENOUS
  Filled 2017-06-30: qty 50

## 2017-06-30 MED ORDER — ENOXAPARIN SODIUM 40 MG/0.4ML ~~LOC~~ SOLN
40.0000 mg | SUBCUTANEOUS | Status: DC
  Administered 2017-06-30: 40 mg via SUBCUTANEOUS
  Filled 2017-06-30: qty 0.4

## 2017-06-30 MED ORDER — METOPROLOL SUCCINATE ER 25 MG PO TB24
25.0000 mg | ORAL_TABLET | Freq: Every day | ORAL | Status: DC
  Administered 2017-06-30 – 2017-07-01 (×2): 25 mg via ORAL
  Filled 2017-06-30 (×2): qty 1

## 2017-06-30 MED ORDER — METOCLOPRAMIDE HCL 10 MG PO TABS
10.0000 mg | ORAL_TABLET | Freq: Once | ORAL | Status: AC
  Administered 2017-06-30: 10 mg via ORAL
  Filled 2017-06-30: qty 1

## 2017-06-30 MED ORDER — SODIUM CHLORIDE 0.9 % IV SOLN: INTRAVENOUS | Status: DC

## 2017-06-30 MED ORDER — TRAZODONE HCL 100 MG PO TABS
100.0000 mg | ORAL_TABLET | Freq: Every day | ORAL | Status: DC
  Administered 2017-06-30: 100 mg via ORAL
  Filled 2017-06-30: qty 1

## 2017-06-30 MED ORDER — CLONAZEPAM 1 MG PO TABS
1.0000 mg | ORAL_TABLET | Freq: Three times a day (TID) | ORAL | Status: DC | PRN
  Administered 2017-06-30 – 2017-07-01 (×2): 1 mg via ORAL
  Filled 2017-06-30 (×2): qty 1

## 2017-06-30 MED ORDER — PROCHLORPERAZINE EDISYLATE 5 MG/ML IJ SOLN
10.0000 mg | Freq: Once | INTRAMUSCULAR | Status: AC
  Administered 2017-06-30: 10 mg via INTRAVENOUS
  Filled 2017-06-30: qty 2

## 2017-06-30 NOTE — ED Notes (Signed)
Report to Eugenie NorrieYari, Charity fundraiserN at [facility]

## 2017-06-30 NOTE — H&P (Signed)
42 year old female Prior history of chronic right shoulder pain, rotator cuff strain 01/2015 Last treated bacterial sinusitis 04/15/2017, Recent gastroenteritis 06/26/2017 Morbidly obese Bipolar Previous admission acute alcoholic hepatitis 04/2016 with lactic acidosis Hospitalization 07/2013 suicidal ideation Hemorrhoidectomy 2011  [facility] left-sided numbness weakness 5 days took over-the-counter medications numbness of trunk Distribution of findings remains unusual-she states that she feels numb and left side of her cheek and her upper face but no numbness in her arms or legs (this then changes on exam and then she states that she feels numbness in her left arm) She also has weakness of grip strength to her right hand and difficulty with dorsiflexion plantarflexion of her foot and had a fall this morning Claims previous visits to headache center in the past and has had migraines "a long time ago"  Just recovering from gastroenteritis-like episodes and was treated with Zofran for the same  Labs normal Pregnancy negative, CT head negative for acute intracranial pathology  Past medical history-as above  Family history father diabetes heart attack Mother's diabetes overweight social history no smoking no drinking   2 children 1511 lives with her mother Works at [employer]  BP 125/89   Pulse (!) 102   Temp 97.7 F (36.5 C)   Resp (!) 21   Ht 5' (1.524 m)   Wt 131.5 kg (290 lb)   LMP 06/23/2017   SpO2 97%   BMI 56.64 kg/m   Symmetric no pallor no icterus, EOMI NCAT, no JVD S1-S2 no murmur slightly tachycardic Chest is clinically clear without added sound Power is 5 out of 5 in upper and lower extremities however slightly weaker with grip strength and dorsi plantarflexion she states that sensation is diminished in the lateral aspect of her left calf thigh and forearm as well as left cheek Vision by direct confrontation is normal Finger-nose-finger  is normal Babinski is downgoing   Impression-42 year old female with vague symptoms concerning for complicated migraine  DDX includes possible TIA-obtaining MRI brain-neurology consulted.  Starting IV Reglan as well as Midrin every 4 as needed for first choice defer to neurology whether to use ergot derivatives and see if this helps may be able to use dexamethasone?  Would hold if not needed given morbidly obese and possible for hypoglycemia  Bipolar continue Wellbutrin 300 daily, Klonopin 1 mg 3 times daily, carbamazepine 300 at bedtime, trazodone 100 at bedtime  Blood pressure continue metoprolol 25 XL daily, Norvasc 2.5 daily   Expect that if patient's MRI is negative then we can just treat symptomatically for complicated migraine discharge tomorrow stabilized  Full code, Lovenox code, observation

## 2017-06-30 NOTE — Consult Note (Signed)
NEURO HOSPITALIST CONSULT NOTE   Requestig physician: Dr. Mahala Menghini   Reason for Consult: HA and left sided symptoms   History obtained from:  Patient     HPI:                                                                                                                                          Marilyn Fowler is an 42 y.o. female with known history of migraine headaches.  The patient states that her headache that she has had for the past 2 weeks is nothing like the migraine headaches that she usually has.  Normal migraine headache for her is throbbing temporal headache, photophobia, phonophobia but she has never had any neurological symptoms such as flashing lights or weakness.  She often receives a migraine cocktail.  Over the past 2 weeks patient states that she has had a mild tension-like headache that is more of a "nagging" headache.  On average is approximately a 3/10 at worst it is a 7/10.  She has been taking ibuprofen for the headache with no resolution.  She does not take ibuprofen chronically prior to the headache.  Over the past 3 or 4 days she has noticed paresthesias that started behind her left ear and then migrated down her left neck and into her left shoulder.  It then skipped her trunk and arm and went to her left leg.  She feels as though the paresthesias are on the lateral side of her leg more than the medial side of the leg.  She states that she is also been weak in her left leg however at this point time she feels stronger than she did prior.  Stress test at 0 829 this a.m. she did receive 10 mg of Reglan, 10 mg of Compazine, then 10 mg of Decadron at 10:00 this a.m.  Currently she is resting comfortably in her bed with a 3/10 headache that is described as a tension headache that surrounds the top of her head.  Past Medical History:  Diagnosis Date  . Alcohol abuse   . Anemia   . Anxiety   . Blood transfusion without reported diagnosis   . Depression    . Hemorrhoid   . Hypertension   . Migraine     Past Surgical History:  Procedure Laterality Date  . HEMORRHOID SURGERY    . HERNIA REPAIR    . TONSILLECTOMY    . VENTRAL HERNIA REPAIR      Family History  Problem Relation Age of Onset  . Osteoarthritis Mother   . Diabetes Mother   . Hypertension Mother   . Depression Mother   . Hypertension Father   . Diabetes Father   . Asthma Son   . Deep vein thrombosis Maternal Grandmother   .  Deep vein thrombosis Maternal Grandfather   . Stroke Neg Hx       Social History:  reports that she has quit smoking. She quit after 10.00 years of use. she has never used smokeless tobacco. She reports that she does not drink alcohol or use drugs.  Allergies  Allergen Reactions  . Toradol [Ketorolac Tromethamine] Other (See Comments)    Pt states it makes her jittery, felt she had something crawling on her     MEDICATIONS:                                                                                                                     Prior to Admission:  Medications Prior to Admission  Medication Sig Dispense Refill Last Dose  . amLODipine (NORVASC) 2.5 MG tablet TAKE 1 TABLET(2.5 MG) BY MOUTH DAILY FOR HIGH BLOOD PRESSURE 30 tablet 6 Taking  . buPROPion (WELLBUTRIN XL) 300 MG 24 hr tablet Take 300 mg by mouth daily.   Taking  . clonazePAM (KLONOPIN) 1 MG tablet 3 (three) times daily as needed.   0 Taking  . EQUETRO 300 MG CP12 TK ONE C PO D  1 Taking  . metoprolol succinate (TOPROL-XL) 25 MG 24 hr tablet TAKE 1 TABLET(25 MG) BY MOUTH DAILY 30 tablet 6 Taking  . ondansetron (ZOFRAN) 4 MG tablet Take 1 tablet (4 mg total) by mouth every 8 (eight) hours as needed for nausea or vomiting. 20 tablet 0   . traZODone (DESYREL) 100 MG tablet TK 1 1/2 TS PO QHS  0 Taking   Scheduled: . amLODipine  2.5 mg Oral Daily  . buPROPion  300 mg Oral Daily  . carbamazepine  300 mg Oral QHS  . enoxaparin (LOVENOX) injection  40 mg Subcutaneous Q24H   . metoCLOPramide (REGLAN) injection  10 mg Intravenous Q8H  . metoprolol succinate  25 mg Oral Daily  . traZODone  100 mg Oral QHS   Continuous: . sodium chloride       ROS:                                                                                                                                       History obtained from the patient  General ROS: negative for - chills, fatigue, fever, night sweats, weight gain or weight loss Psychological ROS: negative for - behavioral disorder, hallucinations, memory difficulties, mood swings or suicidal ideation  Ophthalmic ROS: negative for - blurry vision, double vision, eye pain or loss of vision ENT ROS: negative for - epistaxis, nasal discharge, oral lesions, sore throat, tinnitus or vertigo Allergy and Immunology ROS: negative for - hives or itchy/watery eyes Hematological and Lymphatic ROS: negative for - bleeding problems, bruising or swollen lymph nodes Endocrine ROS: negative for - galactorrhea, hair pattern changes, polydipsia/polyuria or temperature intolerance Respiratory ROS: negative for - cough, hemoptysis, shortness of breath or wheezing Cardiovascular ROS: negative for - chest pain, dyspnea on exertion, edema or irregular heartbeat Gastrointestinal ROS: negative for - abdominal pain, diarrhea, hematemesis, nausea/vomiting or stool incontinence Genito-Urinary ROS: negative for - dysuria, hematuria, incontinence or urinary frequency/urgency Musculoskeletal ROS: negative for - joint swelling or muscular weakness Neurological ROS: as noted in HPI Dermatological ROS: negative for rash and skin lesion changes   Blood pressure 125/89, pulse (!) 102, temperature 97.7 F (36.5 C), resp. rate (!) 21, height 5' (1.524 m), weight 131.5 kg (290 lb), last menstrual period 06/23/2017, SpO2 97 %.   General Examination:                                                                                                       Physical Exam   HEENT-  Normocephalic, no lesions, without obvious abnormality.  Normal external eye and conjunctiva.   Cardiovascular- S1-S2 audible, pulses palpable throughout   Lungs-no rhonchi or wheezing noted, no excessive working breathing.  Saturations within normal limits Abdomen- All 4 quadrants palpated and nontender Extremities- Warm, dry and intact Musculoskeletal-no joint tenderness, deformity or swelling Skin-warm and dry, no hyperpigmentation, vitiligo, or suspicious lesions  Neurological Examination Mental Status: Alert, oriented, thought content appropriate.  Speech fluent without evidence of aphasia.  Able to follow 3 step commands without difficulty. Cranial Nerves: II: Visual fields grossly normal,  III,IV, VI: ptosis not present, extra-ocular motions intact bilaterally pupils equal, round, reactive to light and accommodation V,VII: smile symmetric, with paresthesias of the left V2-V3 region of the face.  Does not split midline. VIII: hearing normal bilaterally IX,X: uvula rises symmetrically XI: bilateral shoulder shrug XII: midline tongue extension Motor: Right : Upper extremity   5/5    Left:     Upper extremity   5/5  Lower extremity   5/5     Lower extremity   5/5 --There were times that she had give way weakness on the left side however when distracted she did have normal 5 out of 5 strength. Tone and bulk:normal tone throughout; no atrophy noted Sensory: Pinprick and light touch stated to be decreased on the left shoulder and left lateral leg.  There was described as if her leg on that side is falling asleep Deep Tendon Reflexes: 2+ bilateral ankles, 1+ bilateral knee jerk, 2+ bilateral brachial radialis, biceps,  Plantars: Right: downgoing   Left: downgoing Cerebellar: normal finger-to-nose,and normal heel-to-shin test Gait: Not tested   Lab Results: Basic Metabolic Panel: Recent Labs  Lab 06/30/17 0753  NA 138  K 3.4*  CL 105  CO2 23  GLUCOSE 140*  BUN 13  CREATININE 0.81  CALCIUM 8.7*    CBC: Recent Labs  Lab 06/30/17 0753  WBC 7.8  NEUTROABS 4.3  HGB 13.2  HCT 38.9  MCV 85.7  PLT 266    Cardiac Enzymes: Recent Labs  Lab 06/30/17 0753  TROPONINI <0.03    Lipid Panel: No results for input(s): CHOL, TRIG, HDL, CHOLHDL, VLDL, LDLCALC in the last 168 hours.  Imaging: Ct Head Wo Contrast  Result Date: 06/30/2017 CLINICAL DATA:  Persistent headache for the past 2 weeks. Left-sided weakness and numbness for the past 3 days. EXAM: CT HEAD WITHOUT CONTRAST TECHNIQUE: Contiguous axial images were obtained from the base of the skull through the vertex without intravenous contrast. COMPARISON:  CT head dated February 04, 2017. FINDINGS: Brain: No evidence of acute infarction, hemorrhage, hydrocephalus, extra-axial collection or mass lesion/mass effect. Unchanged mild cerebral atrophy, greater than expected for age. Vascular: No hyperdense vessel or unexpected calcification. Skull: Normal. Negative for fracture or focal lesion. Sinuses/Orbits: No acute finding. Other: None. IMPRESSION: 1.  No acute intracranial abnormality. 2. Unchanged mild cerebral atrophy, greater than expected for age. Electronically Signed   By: Obie DredgeWilliam T Derry M.D.   On: 06/30/2017 08:20    Assessment and plan per attending neurologist  Felicie Mornavid Smith PA-C [facility] [phone]  06/30/2017, 5:38 PM   Assessment/Plan: This is a 42 year old female presenting to the hospital with a 2-week history of mild, tension-like headache rated 3/10 on average and 7/10 at worst.  In addition to her headache patient has had 3 days of paresthesia of the left side of her face and left leg.  She also describes some weakness of her left leg and arm however she feels that she is stronger today.  CT head showed no intracranial abnormalities.   Recommendation: -Depakote 1 g x1 -Toradol 30 mg IV x1 -MRI brain without contrast

## 2017-06-30 NOTE — ED Notes (Signed)
Pt questioning whether or not she should take her home meds; call placed to hospitalist for med orders. Pt amb to [facility].

## 2017-06-30 NOTE — Progress Notes (Signed)
New Admission Note:  Arrival Method: from [facility]  Mental Orientation: alert & oriented x4 Telemetry:3w15 Assessment: Completed Skin: intact  IV: R AC Pain:0/10 Safety Measures: Safety Fall Prevention Plan was given, discussed. [id number]: Patient has been orientated to the room, unit and the staff. Family: updated  Orders have been reviewed and implemented. Will continue to monitor the patient. Call light has been placed within reach and bed alarm has been activated.   Lawernce IonYari Rilynn Habel ,RN

## 2017-06-30 NOTE — ED Provider Notes (Signed)
Emergency Department Provider Note   I have reviewed the triage vital signs and the nursing notes.   HISTORY  Chief Complaint Numbness; Headache; and Weakness   HPI Marilyn Fowler is a 42 y.o. female HTN, migraine HA, and anxiety thence to the emergency department for evaluation of left sided numbness/weakness for the last 5 days.  Patient has a remote history of migraine headaches but has never had numbness or weakness associated with these headaches.  The headaches stopped after changing jobs returned recently.  2 days prior to her numbness/weakness symptoms she developed a headache that radiates from her temples to the top of her head.  She took over-the-counter medications with no relief.  She reports some photophobia without phonophobia. No fever/chills. No neck pain.  Patient states she developed weakness in the left arm and leg with associated numbness in the left face and leg only 2 days after her headache started.  Numbness and weakness symptoms have worsened since onset.  She has no numbness in the left arm but describes some weakness location along with numbness of the trunk.  She states when she walks she often walks to the left.  Is any double vision but has noticed some blurry vision in the left eye.  Beach changes or difficulty swallowing.  No prior history of stroke, complex migraine, or MS.   Past Medical History:  Diagnosis Date  . Alcohol abuse   . Anemia   . Anxiety   . Blood transfusion without reported diagnosis   . Depression   . Hemorrhoid   . Hypertension   . Migraine     Patient Active Problem List   Diagnosis Date Noted  . Chronic right shoulder pain 09/04/2016  . Hypokalemia 04/30/2016  . Hypomagnesemia 04/30/2016  . Alcoholic hepatitis 04/30/2016  . Alcohol induced fatty liver 04/30/2016  . Alcohol withdrawal (HCC) 04/29/2016  . Alcohol abuse 04/29/2016  . Left medial knee pain 06/29/2015  . Acute bacterial sinusitis 10/11/2014  . Maxillary  sinusitis, acute 09/14/2014  . Gastroenteritis 11/09/2013  . Pleuritic chest pain 09/17/2013  . Mood disorder (HCC) 07/26/2013  . Chest pain 11/18/2012  . Morbid obesity (HCC) 09/29/2012  . HTN (hypertension) 07/12/2012  . Migraine 07/12/2012  . Bipolar II disorder, most recent episode major depressive (HCC) 07/11/2012    Past Surgical History:  Procedure Laterality Date  . HEMORRHOID SURGERY    . HERNIA REPAIR    . TONSILLECTOMY    . VENTRAL HERNIA REPAIR      Current Outpatient Rx  . Order #: [id number] Class: Normal  . Order #: [id number] Class: Historical Med  . Order #: [id number] Class: Historical Med  . Order #: [id number] Class: Historical Med  . Order #: [id number] Class: Normal  . Order #: [id number] Class: Normal  . Order #: [id number] Class: Historical Med    Allergies Toradol [ketorolac tromethamine]  Family History  Problem Relation Age of Onset  . Osteoarthritis Mother   . Diabetes Mother   . Hypertension Mother   . Depression Mother   . Hypertension Father   . Diabetes Father   . Asthma Son   . Deep vein thrombosis Maternal Grandmother   . Deep vein thrombosis Maternal Grandfather   . Stroke Neg Hx     Social History Social History   Tobacco Use  . Smoking status: Former Smoker    Years: 10.00  . Smokeless tobacco: Never Used  . Tobacco comment: smoked 1994-2012, up to 1 pp WEEK  Substance Use Topics  . Alcohol use:  No    Comment: last used ETOH in Jan 2018  . Drug use: No    Review of Systems  Constitutional: No fever/chills Eyes: No visual changes. ENT: No sore throat. Cardiovascular: Denies chest pain. Respiratory: Denies shortness of breath. Gastrointestinal: No abdominal pain.  No nausea, no vomiting.  No diarrhea.  No constipation. Genitourinary: Negative for dysuria. Musculoskeletal: Negative for back pain. Skin: Negative for rash. Neurological: Positive for headaches, left arm/leg weakness, and left face/leg/trunk numbness.  10-point  ROS otherwise negative.  ____________________________________________   PHYSICAL EXAM:  VITAL SIGNS: ED Triage Vitals  Enc Vitals Group     BP 06/30/17 0724 114/76     Pulse Rate 06/30/17 0724 (!) 103     Resp 06/30/17 0724 20     Temp 06/30/17 0724 97.7 F (36.5 C)     Temp Source 06/30/17 0724 Oral     SpO2 06/30/17 0724 97 %     Weight 06/30/17 0724 290 lb (131.5 kg)     Height 06/30/17 0724 5' (1.524 m)     Pain Score 06/30/17 0748 3   Constitutional: Alert and oriented. Well appearing and in no acute distress. Eyes: Conjunctivae are normal. PERRL. EOMI. Head: Atraumatic. Nose: No congestion/rhinnorhea. Mouth/Throat: Mucous membranes are moist.  Oropharynx non-erythematous with symmetrical soft palate.  Neck: No stridor.  Cardiovascular: Tachycardia. Good peripheral circulation. Grossly normal heart sounds.   Respiratory: Normal respiratory effort.  No retractions. Lungs CTAB. Gastrointestinal: Soft and nontender. No distention.  Musculoskeletal: No lower extremity tenderness nor edema. No gross deformities of extremities. Neurologic:  Normal speech and language. Patient with decreased sensation to the left cheek and mandible. Normal sensation of the left forehead. No facial asymmetry. Otherwise normal CN exam. 4/5 LUE strength (biceps,triceps, and grip). 4/5 LLE strength. Normal sensation over the LUE and Decreased sensation over the left lateral leg.  Skin:  Skin is warm, dry and intact. No rash noted.  ____________________________________________   LABS (all labs ordered are listed, but only abnormal results are displayed)  Labs Reviewed  COMPREHENSIVE METABOLIC PANEL - Abnormal; Notable for the following components:      Result Value   Potassium 3.4 (*)    Glucose, Bld 140 (*)    Calcium 8.7 (*)    All other components within normal limits  CBG MONITORING, ED - Abnormal; Notable for the following components:   Glucose-Capillary 128 (*)    All other components  within normal limits  PROTIME-INR  APTT  CBC  DIFFERENTIAL  TROPONIN I  HCG, QUANTITATIVE, PREGNANCY  CBG MONITORING, ED   ____________________________________________  EKG   EKG Interpretation  Date/Time:  Monday June 30 2017 08:19:42 EST Ventricular Rate:  94 PR Interval:    QRS Duration: 82 QT Interval:  369 QTC Calculation: 462 R Axis:   -16 Text Interpretation:  Sinus rhythm Borderline left axis deviation Low voltage, precordial leads Baseline wander in lead(s) V1 V2 No STEMI.  Confirmed by Alona BeneLong, Ayde Record [phone]([id number]) on 06/30/2017 8:44:19 AM       ____________________________________________  RADIOLOGY  Ct Head Wo Contrast  Result Date: 06/30/2017 CLINICAL DATA:  Persistent headache for the past 2 weeks. Left-sided weakness and numbness for the past 3 days. EXAM: CT HEAD WITHOUT CONTRAST TECHNIQUE: Contiguous axial images were obtained from the base of the skull through the vertex without intravenous contrast. COMPARISON:  CT head dated February 04, 2017. FINDINGS: Brain: No evidence of acute infarction, hemorrhage, hydrocephalus, extra-axial collection or mass lesion/mass effect. Unchanged mild cerebral  atrophy, greater than expected for age. Vascular: No hyperdense vessel or unexpected calcification. Skull: Normal. Negative for fracture or focal lesion. Sinuses/Orbits: No acute finding. Other: None. IMPRESSION: 1.  No acute intracranial abnormality. 2. Unchanged mild cerebral atrophy, greater than expected for age. Electronically Signed   By: Obie Dredge M.D.   On: 06/30/2017 08:20    ____________________________________________   PROCEDURES  Procedure(s) performed:   Procedures  None ____________________________________________   INITIAL IMPRESSION / ASSESSMENT AND PLAN / ED COURSE  Pertinent labs & imaging results that were available during my care of the patient were reviewed by me and considered in my medical decision making (see chart for details).  Patient  presents to the emergency department for evaluation of weakness/numbness with associated headache for the past 5 days.  The headache began first and 2 days later the weakness/numbness symptoms began.  She does have notable weakness in the left upper and lower extremities.  No facial asymmetry.  She has a spotty pattern of decreased sensation to light touch including the left face, trunk, left lateral leg.  Normal sensation of the medial leg and normal sensation of the LUE.  Plan for noncontrast CT, labs, headache medication, and neurology consultation.  Considering stroke, MS, brain lesion, and complex migraine at this time.   10:15 AM Spoke with the tele-neurologist regarding the case.  Advises admission for MRI of the brain both with and without contrast.  I do not have MRI at this facility and thus this will require transfer.  Given the patient's continued deficits and difficulty walking she will likely require physical therapy evaluation as well.  Plan to admit her to the hospitalist service.  I did speak with Dr. Amada Jupiter with neurology at [facility].  He will add them to the consult list but asks that the primary team page Neurology to let them know when she actually arrives at [facility].   Discussed patient's case with Hospitalist, Dr. Ophelia Charter to request admission. Patient and family (if present) updated with plan. Care transferred to Hospitalist service.  I reviewed all nursing notes, vitals, pertinent old records, EKGs, labs, imaging (as available).  ____________________________________________  FINAL CLINICAL IMPRESSION(S) / ED DIAGNOSES  Final diagnoses:  Left-sided weakness  Acute nonintractable headache, unspecified headache type     MEDICATIONS GIVEN DURING THIS VISIT:  Medications  magnesium sulfate IVPB 2 g 50 mL (2 g Intravenous New Bag/Given 06/30/17 1007)  prochlorperazine (COMPAZINE) tablet 10 mg (10 mg Oral Given 06/30/17 0829)  metoCLOPramide (REGLAN) tablet 10 mg (10 mg Oral  Given 06/30/17 0829)  sodium chloride 0.9 % bolus 1,000 mL (0 mLs Intravenous Stopped 06/30/17 1010)  dexamethasone (DECADRON) injection 10 mg (10 mg Intravenous Given 06/30/17 1004)    Note:  This document was prepared using Dragon voice recognition software and may include unintentional dictation errors.  Alona Bene, MD Emergency Medicine    Timon Geissinger, Arlyss Repress, MD 06/30/17 [phone]

## 2017-06-30 NOTE — Progress Notes (Signed)
Patient presenting [facility] with headache and left-sided weakness/numbness x 5 days, unusual patchy distribution on the left.  Remote h/o migraines.  LUE and LLE weakness, affecting her gait.  Teleneurology and Dr. Amada JupiterKirkpatrick both recommend MRI with/without contrast, PT evaluation.  Given steroids, mag, Reglan, Compazine - no significant improvement.  Will place in observation on telemetry for now.  Will need to page neurology upon patient arrival.  Georgana CurioJennifer E. Bonny Egger, M.D.

## 2017-06-30 NOTE — ED Notes (Signed)
Per [facility], admitting MD will not provide orders on pt until [facility]. RN discussed pt's question with EDP and EDP does not feel she needs any of her home meds at this time. PT made aware.

## 2017-06-30 NOTE — ED Triage Notes (Signed)
Pt states she has had a headache for 2 weeks.  Left sided weakness, and numbness to left face, left side of torso, and left leg.  Pt states she leans and walks to the left.  No fever.  Some left blurry vision.  No slurred speech.

## 2017-06-30 NOTE — ED Notes (Signed)
Pt's mother bringing her something to eat

## 2017-07-01 ENCOUNTER — Encounter: Payer: Self-pay | Admitting: Family Medicine

## 2017-07-01 DIAGNOSIS — R51 Headache: Secondary | ICD-10-CM

## 2017-07-01 DIAGNOSIS — R531 Weakness: Secondary | ICD-10-CM | POA: Diagnosis not present

## 2017-07-01 LAB — HIV ANTIBODY (ROUTINE TESTING W REFLEX): HIV SCREEN 4TH GENERATION: NONREACTIVE

## 2017-07-01 MED ORDER — GABAPENTIN 300 MG PO CAPS: 300.0000 mg | ORAL_CAPSULE | Freq: Three times a day (TID) | ORAL | 0 refills | Status: DC

## 2017-07-01 NOTE — Discharge Summary (Signed)
Physician Discharge Summary  [name] ZOX:[id number] DOB: [birth year] DOA: 06/30/2017  PCP: Sheliah Hatchabori, Katherine E, MD  Admit date: 06/30/2017 Discharge date: 07/01/2017  Time spent: 20 minutes  Recommendations for Outpatient Follow-up:  1. Needs Optho input aas OP for partial empty sella 2. Started gabapentin 300 tid for 5 days-please coordinate a neuro follow up as OP for migraines 3. Work-note given on d/c  Discharge Diagnoses:  Active Problems:   Migraine   Headache   Discharge Condition: improved  Diet recommendation: hh low salt  Filed Weights   06/30/17 0724  Weight: 131.5 kg (290 lb)    History of present illness:  42 year old female Prior history of chronic right shoulder pain, rotator cuff strain 01/2015 Last treated bacterial sinusitis 04/15/2017, Recent gastroenteritis 06/26/2017 Morbidly obese Bipolar Previous admission acute alcoholic hepatitis 04/2016 with lactic acidosis Hospitalization 07/2013 suicidal ideation Hemorrhoidectomy 2011  [facility] left-sided numbness weakness 5 days took over-the-counter medications numbness of trunk  Hospital Course:  Patient had MRI which was neg Neuro saw-felt condition was akin to complicated migraines-started gabapentin 300 tid for 5 days MRI showed no stroke but partial empty sella-d/w patient to follow with [facility] [already has appt] Stabilized on d/c  Discharge Exam: Vitals:   07/01/17 0426 07/01/17 0818  BP: 110/72 (!) 105/50  Pulse: 86 90  Resp: 20 18  Temp: 98.3 F (36.8 C) 97.8 F (36.6 C)  SpO2: 93% 100%    General: awake alert pleasant in  nad Cardiovascular:  s1 s 2no m/r/g Respiratory: clear no added sound Neuro intact moving limbs x 4 equally smile symm poweer resolved 5/5 no deficit  Discharge Instructions   Discharge Instructions    Diet - low sodium heart healthy   Complete by:  As directed    Discharge instructions   Complete by:  As directed    Take gabapentin for  5-6 days and then stop-you will need a follow-up with a neurologist eventually for MIgraines and this can be arranged by your PCP Please follow up with Opthalomology in the near future Follow with your psychiatrist going forward   Increase activity slowly   Complete by:  As directed      Allergies as of 07/01/2017      Reactions   Toradol [ketorolac Tromethamine] Other (See Comments)   Pt states it makes her jittery, felt she had something crawling on her       Medication List    TAKE these medications   amLODipine 2.5 MG tablet Commonly known as:  NORVASC TAKE 1 TABLET(2.5 MG) BY MOUTH DAILY FOR HIGH BLOOD PRESSURE   buPROPion 300 MG 24 hr tablet Commonly known as:  WELLBUTRIN XL Take 300 mg by mouth every morning.   calcium carbonate 1250 (500 Ca) MG tablet Commonly known as:  OS-CAL - dosed in mg of elemental calcium Take 1 tablet by mouth daily with breakfast.   cholecalciferol 1000 units tablet Commonly known as:  VITAMIN D Take 1,000 Units by mouth daily.   clonazePAM 1 MG tablet Commonly known as:  KLONOPIN Take 0.5-1 mg by mouth See admin instructions. 1mg  at 0800 and 1200, then 0.5mg  at 2000   EQUETRO 300 MG Cp12 Generic drug:  Carbamazepine Take one tablet (300mg ) at bedtime.   gabapentin 300 MG capsule Commonly known as:  NEURONTIN Take 1 capsule (300 mg total) by mouth 3 (three) times daily.   metoprolol succinate 25 MG 24 hr tablet Commonly known as:  TOPROL-XL TAKE 1  TABLET(25 MG) BY MOUTH DAILY What changed:  See the new instructions.   multivitamin with minerals Tabs tablet Take 1 tablet by mouth daily.   ondansetron 4 MG tablet Commonly known as:  ZOFRAN Take 1 tablet (4 mg total) by mouth every 8 (eight) hours as needed for nausea or vomiting.   traZODone 100 MG tablet Commonly known as:  DESYREL Take 200 mg by mouth at bedtime.   vitamin C 500 MG tablet Commonly known as:  ASCORBIC ACID Take 500 mg by mouth daily.      Allergies   Allergen Reactions  . Toradol [Ketorolac Tromethamine] Other (See Comments)    Pt states it makes her jittery, felt she had something crawling on her       The results of significant diagnostics from this hospitalization (including imaging, microbiology, ancillary and laboratory) are listed below for reference.    Significant Diagnostic Studies: Ct Head Wo Contrast  Result Date: 06/30/2017 CLINICAL DATA:  Persistent headache for the past 2 weeks. Left-sided weakness and numbness for the past 3 days. EXAM: CT HEAD WITHOUT CONTRAST TECHNIQUE: Contiguous axial images were obtained from the base of the skull through the vertex without intravenous contrast. COMPARISON:  CT head dated February 04, 2017. FINDINGS: Brain: No evidence of acute infarction, hemorrhage, hydrocephalus, extra-axial collection or mass lesion/mass effect. Unchanged mild cerebral atrophy, greater than expected for age. Vascular: No hyperdense vessel or unexpected calcification. Skull: Normal. Negative for fracture or focal lesion. Sinuses/Orbits: No acute finding. Other: None. IMPRESSION: 1.  No acute intracranial abnormality. 2. Unchanged mild cerebral atrophy, greater than expected for age. Electronically Signed   By: Obie Dredge M.D.   On: 06/30/2017 08:20   Mr Brain Wo Contrast  Result Date: 06/30/2017 CLINICAL DATA:  Headache for 2 weeks, pain atypical for patient's normal migraines. EXAM: MRI HEAD WITHOUT CONTRAST TECHNIQUE: Multiplanar, multiecho pulse sequences of the brain and surrounding structures were obtained without intravenous contrast. COMPARISON:  CT HEAD June 30, 2017 FINDINGS: INTRACRANIAL CONTENTS: No reduced diffusion to suggest acute ischemia or hyperacute demyelination. No susceptibility artifact to suggest hemorrhage. Borderline parenchymal brain volume loss. No hydrocephalus. A few punctate supratentorial white matter FLAIR T2 hyperintensities, nonspecific and, normal for age. No suspicious parenchymal  signal, masses, mass effect. No abnormal extra-axial fluid collections. No extra-axial masses. VASCULAR: Normal major intracranial vascular flow voids present at skull base. SKULL AND UPPER CERVICAL SPINE: Partially empty sella. No suspicious calvarial bone marrow signal. Craniocervical junction maintained. SINUSES/ORBITS: The mastoid air-cells and included paranasal sinuses are well-aerated.The included ocular globes and orbital contents are non-suspicious. OTHER: None. IMPRESSION: 1. No acute intracranial process. 2. Borderline parenchymal brain volume loss for age. 3. Partially empty sella. Electronically Signed   By: Awilda Metro M.D.   On: 06/30/2017 20:20    Microbiology: No results found for this or any previous visit (from the past 240 hour(s)).   Labs: Basic Metabolic Panel: Recent Labs  Lab 06/30/17 0753 06/30/17 1910  NA 138  --   K 3.4*  --   CL 105  --   CO2 23  --   GLUCOSE 140*  --   BUN 13  --   CREATININE 0.81 0.81  CALCIUM 8.7*  --    Liver Function Tests: Recent Labs  Lab 06/30/17 0753  AST 27  ALT 20  ALKPHOS 83  BILITOT 0.3  PROT 7.4  ALBUMIN 3.8   No results for input(s): LIPASE, AMYLASE in the last 168 hours. No results for  input(s): AMMONIA in the last 168 hours. CBC: Recent Labs  Lab 06/30/17 0753 06/30/17 1910  WBC 7.8 9.1  NEUTROABS 4.3  --   HGB 13.2 12.7  HCT 38.9 37.9  MCV 85.7 85.6  PLT 266 292   Cardiac Enzymes: Recent Labs  Lab 06/30/17 0753  TROPONINI <0.03   BNP: BNP (last 3 results) No results for input(s): BNP in the last 8760 hours.  ProBNP (last 3 results) No results for input(s): PROBNP in the last 8760 hours.  CBG: Recent Labs  Lab 06/30/17 0753  GLUCAP 128*       Signed:  Rhetta Mura MD   [facility] 07/01/2017, 9:03 AM

## 2017-07-01 NOTE — Progress Notes (Addendum)
Subjective: Feels that her leg numbness has significantly improved, still with facial numbness, mild headache.   Exam: Vitals:   07/01/17 0426 07/01/17 0818  BP: 110/72 (!) 105/50  Pulse: 86 90  Resp: 20 18  Temp: 98.3 F (36.8 C) 97.8 F (36.6 C)  SpO2: 93% 100%   Gen: In bed, NAD Resp: non-labored breathing, no acute distress Abd: soft, nt  Neuro: MS: awake, alert, interactive and appropriate.  ZO:XWRUECN:PERRL, VFF Motor: 5/5 throughout.  Sensory:decreased on the lateral aspect of her left leg and left lower face.   MRI reviewed, no definite abnormalities.   Impression: 42 yo F with left sided paresthesias and headaches. With improvement and normal imaging, this is reassuring. I ssupect persistent migraine with aura.   I would favor treating with gabapentin for the paresthesias and this might help calm down her headache as well. With partially empty sella(can be seen in IIH), formal ophthalmologic exam would be reasonable, could be done as outpatient.   Recommendations: 1) Gabapentin 300mg  TID x 5 days.  2) I advised she see eye doctor for fundus exam as outpatient, she already has one scheduled next week.  3) Outpatient neurology follow up.   Ritta SlotMcNeill Kirkpatrick, MD [facility] [phone]  If 7pm- 7am, please page neurology on call as listed in AMION.

## 2017-07-01 NOTE — Progress Notes (Signed)
Discharge instructions reviewed with patient/family. All questions answered at this time. Transport home by family.   Romelle Muldoon, RN 

## 2017-07-01 NOTE — Care Management Note (Signed)
Case Management Note  Patient Details  Name: Marilyn Fowler MRN: [id number] Date of Birth: 09-17-1975  Subjective/Objective:     Pt in with headache. She is from home with her mother and children.            Action/Plan: Pt discharging home with self care. Pt has PCP, insurance and transportation home. CM signing off.   Expected Discharge Date:  07/01/17               Expected Discharge Plan:  Home/Self Care  In-House Referral:     Discharge planning Services     Post Acute Care Choice:    Choice offered to:     DME Arranged:    DME Agency:     HH Arranged:    HH Agency:     Status of Service:  Completed, signed off  If discussed at [employer] Length of Stay Meetings, dates discussed:    Additional Comments:  Kermit BaloKelli F Milo Solana, RN 07/01/2017, 11:12 AM

## 2017-07-02 ENCOUNTER — Telehealth: Payer: Self-pay

## 2017-07-02 NOTE — Telephone Encounter (Signed)
Transition Care Management Follow-up Telephone Call  Admit date: 06/30/2017 Discharge date: 07/01/2017 Discharge Diagnoses:  Active Problems:   Migraine   Headache    How have you been since you were released from the hospital? "I'm okay"   Do you understand why you were in the hospital? Yes. Pt with left sided weakness/numbness x 5 days,stroke work up (negative)   Do you understand the discharge instructions? yes   Where were you discharged to? Home.    Items Reviewed:  Medications reviewed: yes  Allergies reviewed: yes  Dietary changes reviewed: yes  Referrals reviewed: yes, appt with ophthalmologist next week   Functional Questionnaire:   Activities of Daily Living (ADLs):   She states they are independent in the following: ambulation, bathing and hygiene, feeding, continence, grooming, toileting and dressing States they require assistance with the following: None.    Any transportation issues/concerns?: no   Any patient concerns? no   Confirmed importance and date/time of follow-up visits scheduled yes  Declines f/u appt with PCP at this time. Will call if symptoms re-occur.   Confirmed with patient if condition begins to worsen call PCP or go to the ER.  Patient was given the office number and encouraged to call back with question or concerns.  : yes

## 2017-07-03 ENCOUNTER — Ambulatory Visit: Payer: Self-pay | Admitting: *Deleted

## 2017-07-03 ENCOUNTER — Encounter: Payer: Self-pay | Admitting: Family Medicine

## 2017-07-03 ENCOUNTER — Other Ambulatory Visit: Payer: Self-pay | Admitting: *Deleted

## 2017-07-03 MED ORDER — GABAPENTIN 100 MG PO CAPS: ORAL_CAPSULE | ORAL | 0 refills | Status: DC

## 2017-07-03 NOTE — Telephone Encounter (Signed)
Yes- gabapentin can cause these symptoms.  Starting at 300mg  TID is a high dose.  I often start patients at 100mg  nightly x1 week, then increase to twice daily x1 week, and then increase to TID.  Once you are tolerating 100mg  3x/day, we can increase to 300mg  nightly and follow the same titration protocol (300mg  nightly x1 week- while taking the 100mg  the other 2 dose, and then 300mg  twice daily x1 week, and then increase to 300mg  TID)  Please send new script for 100mg - QHS x1 week, then BID x1 week, then TID #90, no refills

## 2017-07-03 NOTE — Telephone Encounter (Signed)
Discussed notes below with patient and she stated verbal understanding.  New RX called in with titration directions.  Patient stated verbal understanding.

## 2017-07-03 NOTE — Telephone Encounter (Signed)
Pt was in the hospital and was dx with  Complications d/t  a migraine. Her symptoms affected the left side of her body except her arm with numbness and tingling and blurred vision.  She was put on gabapentin while there.  She was discharged on Tues this week.  She is now experiencing weakness, forgetfulness, loss of balance and not being able to focus.  She is wondering what to do for these symptoms and wondering if it could be the gabapentin. [place] notified, per protocol and spoke with Lanora Manislizabeth and they will contact patient. Pt expecting a return call.   Reason for Disposition . Caller has URGENT medication question about med that PCP prescribed and triager unable to answer question  Answer Assessment - Initial Assessment Questions 1. SYMPTOMS: "Do you have any symptoms?"     Dizziness, forgetfulness, can not focus, lost balance 2. SEVERITY: If symptoms are present, ask "Are they mild, moderate or severe?"     moderate  Protocols used: MEDICATION QUESTION CALL-A-AH

## 2017-07-18 DIAGNOSIS — M25562 Pain in left knee: Secondary | ICD-10-CM | POA: Diagnosis not present

## 2017-07-18 DIAGNOSIS — R202 Paresthesia of skin: Secondary | ICD-10-CM | POA: Diagnosis not present

## 2017-07-18 DIAGNOSIS — M25561 Pain in right knee: Secondary | ICD-10-CM | POA: Diagnosis not present

## 2017-08-01 ENCOUNTER — Other Ambulatory Visit: Payer: Self-pay | Admitting: Family Medicine

## 2017-08-01 ENCOUNTER — Encounter: Payer: 59 | Admitting: Family Medicine

## 2017-08-01 DIAGNOSIS — Z0289 Encounter for other administrative examinations: Secondary | ICD-10-CM

## 2017-08-07 ENCOUNTER — Encounter: Payer: Self-pay | Admitting: General Practice

## 2017-08-07 ENCOUNTER — Other Ambulatory Visit: Payer: Self-pay

## 2017-08-07 ENCOUNTER — Encounter: Payer: Self-pay | Admitting: Family Medicine

## 2017-08-07 ENCOUNTER — Other Ambulatory Visit: Payer: Self-pay | Admitting: Family Medicine

## 2017-08-07 ENCOUNTER — Ambulatory Visit (INDEPENDENT_AMBULATORY_CARE_PROVIDER_SITE_OTHER): Payer: 59 | Admitting: Family Medicine

## 2017-08-07 VITALS — BP 122/82 | HR 76 | Temp 98.0°F | Resp 16 | Ht 60.0 in | Wt 282.1 lb

## 2017-08-07 DIAGNOSIS — G43109 Migraine with aura, not intractable, without status migrainosus: Secondary | ICD-10-CM

## 2017-08-07 DIAGNOSIS — Z0000 Encounter for general adult medical examination without abnormal findings: Secondary | ICD-10-CM | POA: Insufficient documentation

## 2017-08-07 DIAGNOSIS — N926 Irregular menstruation, unspecified: Secondary | ICD-10-CM

## 2017-08-07 DIAGNOSIS — F3181 Bipolar II disorder: Secondary | ICD-10-CM

## 2017-08-07 LAB — LIPID PANEL
CHOL/HDL RATIO: 5
Cholesterol: 176 mg/dL (ref 0–200)
HDL: 37.2 mg/dL — AB (ref >39.00)
LDL Cholesterol: 112 mg/dL — ABNORMAL HIGH (ref 0–99)
NONHDL: 139.02
Triglycerides: 135 mg/dL (ref 0.0–149.0)
VLDL: 27 mg/dL (ref 0.0–40.0)

## 2017-08-07 LAB — TSH: TSH: 2.48 u[IU]/mL (ref 0.35–4.50)

## 2017-08-07 LAB — BASIC METABOLIC PANEL
BUN: 8 mg/dL (ref 6–23)
CO2: 27 mEq/L (ref 19–32)
Calcium: 9.3 mg/dL (ref 8.4–10.5)
Chloride: 103 mEq/L (ref 96–112)
Creatinine, Ser: 0.91 mg/dL (ref 0.40–1.20)
GFR: 72.43 mL/min (ref >60.00)
Glucose, Bld: 96 mg/dL (ref 70–99)
Potassium: 4 mEq/L (ref 3.5–5.1)
Sodium: 136 mEq/L (ref 135–145)

## 2017-08-07 LAB — HEMOGLOBIN A1C: Hgb A1c MFr Bld: 5.1 % (ref 4.6–6.5)

## 2017-08-07 MED ORDER — PHENTERMINE HCL 37.5 MG PO CAPS: 37.5000 mg | ORAL_CAPSULE | ORAL | 2 refills | Status: DC

## 2017-08-07 NOTE — Assessment & Plan Note (Signed)
Pt is asking for referral [facility] to [facility]- referral placed.

## 2017-08-07 NOTE — Progress Notes (Signed)
   Subjective:    Patient ID: Marilyn MaesLeslie Fowler, female    DOB: 05-31-1975, 42 y.o.   MRN: [id number]  HPI CPE- due to start mammo this year.  Overdue for pap.  She is having menstrual irregularity and is concerned for endometriosis.  Will refer to GYN.  Pt is down 8 lbs since last visit.  BMI is 55.1  Pt is interested in restarting Phentermine.   Review of Systems Patient reports no vision/ hearing changes, adenopathy,fever, weight change,  persistant/recurrent hoarseness , swallowing issues, chest pain, palpitations, edema, persistant/recurrent cough, hemoptysis, dyspnea (rest/exertional/paroxysmal nocturnal), gastrointestinal bleeding (melena, rectal bleeding), abdominal pain, significant heartburn, bowel changes, GU symptoms (dysuria, hematuria, incontinence),  syncope, focal weakness, memory loss, numbness & tingling, skin/hair/nail changes, abnormal bruising or bleeding, anxiety, or depression.     Objective:   Physical Exam General Appearance:    Alert, cooperative, no distress, appears stated age, obese  Head:    Normocephalic, without obvious abnormality, atraumatic  Eyes:    PERRL, conjunctiva/corneas clear, EOM's intact, fundi    benign, both eyes  Ears:    Normal TM's and external ear canals, both ears  Nose:   Nares normal, septum midline, mucosa normal, no drainage    or sinus tenderness  Throat:   Lips, mucosa, and tongue normal; teeth and gums normal  Neck:   Supple, symmetrical, trachea midline, no adenopathy;    Thyroid: no enlargement/tenderness/nodules  Back:     Symmetric, no curvature, ROM normal, no CVA tenderness  Lungs:     Clear to auscultation bilaterally, respirations unlabored  Chest Wall:    No tenderness or deformity   Heart:    Regular rate and rhythm, S1 and S2 normal, no murmur, rub   or gallop  Breast Exam:    Deferred to GYN  Abdomen:     Soft, non-tender, bowel sounds active all four quadrants,    no masses, no organomegaly  Genitalia:    Deferred to  GYN  Rectal:    Extremities:   Extremities normal, atraumatic, no cyanosis or edema  Pulses:   2+ and symmetric all extremities  Skin:   Skin color, texture, turgor normal, no rashes or lesions  Lymph nodes:   Cervical, supraclavicular, and axillary nodes normal  Neurologic:   CNII-XII intact, normal strength, sensation and reflexes    throughout          Assessment & Plan:

## 2017-08-07 NOTE — Assessment & Plan Note (Signed)
Following w/ Anne Fulay Shugart

## 2017-08-07 NOTE — Assessment & Plan Note (Signed)
Pt's PE WNL w/ exception of obesity.  Due for GYN- referral placed.  Declined Tdap.  Check labs.  Anticipatory guidance provided.

## 2017-08-07 NOTE — Patient Instructions (Signed)
Follow up in 6 weeks to recheck your BP, pulse, and weight loss progress We'll notify you of your lab results and make any changes if needed We'll call you with your GYN and [facility] appts Continue to work on healthy diet and regular exercise- you can do it! Restart the Phentermine once daily Call with any questions or concerns Happy Early Birthday!!!

## 2017-08-07 NOTE — Assessment & Plan Note (Signed)
Ongoing issue for pt.  She is down 8 lbs since late February.  Applauded her efforts.  Stressed need for healthy diet and regular exercise.  Will restart Phentermine at pt's request.  Will follow closely.

## 2017-09-16 ENCOUNTER — Ambulatory Visit: Payer: 59 | Admitting: Family Medicine

## 2017-10-04 ENCOUNTER — Encounter: Payer: Self-pay | Admitting: Family Medicine

## 2017-10-04 DIAGNOSIS — Z01419 Encounter for gynecological examination (general) (routine) without abnormal findings: Secondary | ICD-10-CM

## 2017-10-04 DIAGNOSIS — G43109 Migraine with aura, not intractable, without status migrainosus: Secondary | ICD-10-CM

## 2017-10-06 NOTE — Telephone Encounter (Signed)
Patient needs referral to [facility] and [facility]. Ok for Referrals? Please advise.   Kathi SimpersAmy Tavarion Babington,  LPN

## 2017-10-24 ENCOUNTER — Ambulatory Visit: Payer: 59 | Admitting: Family Medicine

## 2018-02-11 DIAGNOSIS — Z01419 Encounter for gynecological examination (general) (routine) without abnormal findings: Secondary | ICD-10-CM | POA: Diagnosis not present

## 2018-02-16 DIAGNOSIS — R202 Paresthesia of skin: Secondary | ICD-10-CM | POA: Diagnosis not present

## 2018-02-16 DIAGNOSIS — M25361 Other instability, right knee: Secondary | ICD-10-CM | POA: Diagnosis not present

## 2018-02-16 DIAGNOSIS — M25561 Pain in right knee: Secondary | ICD-10-CM | POA: Diagnosis not present

## 2018-03-03 DIAGNOSIS — M25561 Pain in right knee: Secondary | ICD-10-CM | POA: Diagnosis not present

## 2018-03-03 DIAGNOSIS — R202 Paresthesia of skin: Secondary | ICD-10-CM | POA: Diagnosis not present

## 2018-03-12 DIAGNOSIS — M25561 Pain in right knee: Secondary | ICD-10-CM | POA: Diagnosis not present

## 2018-03-20 ENCOUNTER — Ambulatory Visit: Payer: Self-pay | Admitting: Psychiatry

## 2018-04-01 DIAGNOSIS — M25361 Other instability, right knee: Secondary | ICD-10-CM | POA: Diagnosis not present

## 2018-04-01 DIAGNOSIS — M25561 Pain in right knee: Secondary | ICD-10-CM | POA: Diagnosis not present

## 2018-04-13 ENCOUNTER — Other Ambulatory Visit: Payer: Self-pay | Admitting: Psychiatry

## 2018-04-14 NOTE — Telephone Encounter (Signed)
Review paper chart not in epic yet

## 2018-04-28 ENCOUNTER — Ambulatory Visit (INDEPENDENT_AMBULATORY_CARE_PROVIDER_SITE_OTHER): Payer: 59 | Admitting: Psychiatry

## 2018-04-28 DIAGNOSIS — F411 Generalized anxiety disorder: Secondary | ICD-10-CM | POA: Diagnosis not present

## 2018-04-28 DIAGNOSIS — F3181 Bipolar II disorder: Secondary | ICD-10-CM

## 2018-04-28 MED ORDER — TRAZODONE HCL 100 MG PO TABS: ORAL_TABLET | ORAL | 2 refills | Status: DC

## 2018-04-28 MED ORDER — CLONAZEPAM 1 MG PO TABS: ORAL_TABLET | ORAL | 2 refills | Status: DC

## 2018-04-28 MED ORDER — BUPROPION HCL ER (XL) 300 MG PO TB24: 300.0000 mg | ORAL_TABLET | ORAL | 2 refills | Status: DC

## 2018-04-28 MED ORDER — CARBAMAZEPINE ER 200 MG PO CP12: 200.0000 mg | ORAL_CAPSULE | Freq: Two times a day (BID) | ORAL | 2 refills | Status: DC

## 2018-04-28 MED ORDER — ZOLPIDEM TARTRATE 10 MG PO TABS: 10.0000 mg | ORAL_TABLET | Freq: Every evening | ORAL | 2 refills | Status: DC | PRN

## 2018-04-28 NOTE — Progress Notes (Signed)
[facility]  Patient ID: Marilyn Fowler,  MRN: [id number]  PCP: Marilyn Hatchabori, Katherine E, MD  Date of Evaluation: 04/28/2018 Time spent:20 minutes  Chief Complaint:   HISTORY/CURRENT STATUS: HPI last seen August of this year.  Diagnoses include bipolar disorder and anxiety with panic attack.  She was depressed and had anxiety.  Some anger with her son.  Insomnia.  She did not want to start lithium.  We increased her Equetro to 200 mg 2 at bed. Her depression is better.  Still having anxiety.  Her anger is fine.  Continues to have insomnia.  She has been a job loss x2 weeks.  Individual Medical History/ Review of Systems: Changes? :No   Allergies: Toradol [ketorolac tromethamine]  Current Medications:  Current Outpatient Medications:  .  buPROPion (WELLBUTRIN XL) 300 MG 24 hr tablet, Take 1 tablet (300 mg total) by mouth every morning., Disp: 30 tablet, Rfl: 2 .  clonazePAM (KLONOPIN) 1 MG tablet, TAKE 1 TABLET BY MOUTH EVERY MORNING, 1 TABLET EVERY EVENING, AND 1/2 TABLET AT BEDTIME, Disp: 75 tablet, Rfl: 2 .  traZODone (DESYREL) 100 MG tablet, Take 200 mg by mouth at bedtime., Disp: 60 tablet, Rfl: 2 .  amLODipine (NORVASC) 2.5 MG tablet, TAKE 1 TABLET(2.5 MG) BY MOUTH DAILY FOR HIGH BLOOD PRESSURE, Disp: 30 tablet, Rfl: 6 .  carbamazepine (EQUETRO) 200 MG CP12 12 hr capsule, Take 1 capsule (200 mg total) by mouth 2 (two) times daily., Disp: 60 each, Rfl: 2 .  cholecalciferol (VITAMIN D) 1000 units tablet, Take 1,000 Units by mouth daily., Disp: , Rfl:  .  metoprolol succinate (TOPROL-XL) 25 MG 24 hr tablet, TAKE 1 TABLET(25 MG) BY MOUTH DAILY, Disp: 30 tablet, Rfl: 6 .  Multiple Vitamin (MULTIVITAMIN WITH MINERALS) TABS tablet, Take 1 tablet by mouth daily., Disp: , Rfl:  .  phentermine 37.5 MG capsule, Take 1 capsule (37.5 mg total) by mouth every morning. (Patient not taking: Reported on 04/28/2018), Disp: 30 capsule, Rfl: 2 .  vitamin C (ASCORBIC ACID) 500 MG tablet, Take  500 mg by mouth daily., Disp: , Rfl:  .  zolpidem (AMBIEN) 10 MG tablet, Take 1 tablet (10 mg total) by mouth at bedtime as needed for sleep., Disp: 30 tablet, Rfl: 2 Medication Side Effects: none  Family Medical/ Social History: Changes? No  MENTAL HEALTH EXAM:  There were no vitals taken for this visit.There is no height or weight on file to calculate BMI.  General Appearance: Casual  Eye Contact:  Good  Speech:  Clear and Coherent  Volume:  Normal  Mood:  Euthymic  Affect:  Appropriate  Thought Process:  Linear  Orientation:  Full (Time, Place, and Person)  Thought Content: WDL   Suicidal Thoughts:  No  Homicidal Thoughts:  No  Memory:  WNL  Judgement:  Good  Insight:  Good  Psychomotor Activity:  Normal  Concentration:  Concentration: Good  Recall:  Good  Fund of Knowledge: Good  Language: Good  Assets:  Social Support  ADL's:  Intact  Cognition: WNL  Prognosis:  Good    DIAGNOSES:    ICD-10-CM   1. Bipolar II disorder, most recent episode major depressive (HCC) F31.81   2. Anxiety state F41.1     Receiving Psychotherapy: No    RECOMMENDATIONS: Patient feels that her anxiety is triggered by her job loss leading to insomnia.  Therefore I will start her back on Ambien 10 mg a day for 3 months.  Will continue on trazodone 200  mg at at bedtime, Wellbutrin XL 300, Equetro 200 mg 2 a day, Klonopin 1 mg 1 in the morning 1 at supper and one half at bed.  Currently cannot afford getting a sleep study. Turn in 3 months.   Marilyn Fulay Frannie Shedrick, PA-C

## 2018-05-12 ENCOUNTER — Telehealth: Payer: Self-pay | Admitting: Psychiatry

## 2018-05-12 NOTE — Telephone Encounter (Signed)
Pt called requesting refill Clonazopam at [facility] [address] [facility]

## 2018-05-12 NOTE — Telephone Encounter (Signed)
rx called in again from 04/28/2018 for clonazepam #75 2 additional refills to [facility] [address]

## 2018-07-17 ENCOUNTER — Other Ambulatory Visit: Payer: Self-pay | Admitting: Psychiatry

## 2018-07-20 ENCOUNTER — Telehealth: Payer: Self-pay | Admitting: Psychiatry

## 2018-07-20 ENCOUNTER — Other Ambulatory Visit: Payer: Self-pay | Admitting: Psychiatry

## 2018-07-20 MED ORDER — ZOLPIDEM TARTRATE 10 MG PO TABS: 5.0000 mg | ORAL_TABLET | Freq: Every evening | ORAL | 0 refills | Status: DC | PRN

## 2018-07-20 MED ORDER — ZOLPIDEM TARTRATE 10 MG PO TABS: ORAL_TABLET | ORAL | 0 refills | Status: DC

## 2018-07-20 NOTE — Telephone Encounter (Signed)
Pt requested refill on Ambien 10 mg 1 @ bedtime. Provider gave her on 04/28/18 visit to try.  [facility] same location as before.

## 2018-08-10 ENCOUNTER — Telehealth: Payer: Self-pay | Admitting: Psychiatry

## 2018-08-10 ENCOUNTER — Other Ambulatory Visit: Payer: Self-pay | Admitting: Physician Assistant

## 2018-08-10 ENCOUNTER — Other Ambulatory Visit: Payer: Self-pay

## 2018-08-10 MED ORDER — TRAZODONE HCL 100 MG PO TABS: ORAL_TABLET | ORAL | 1 refills | Status: DC

## 2018-08-10 MED ORDER — BUPROPION HCL ER (XL) 300 MG PO TB24: 300.0000 mg | ORAL_TABLET | ORAL | 1 refills | Status: DC

## 2018-08-10 MED ORDER — CLONAZEPAM 1 MG PO TABS: ORAL_TABLET | ORAL | 1 refills | Status: DC

## 2018-08-10 MED ORDER — CARBAMAZEPINE ER 200 MG PO CP12: 200.0000 mg | ORAL_CAPSULE | Freq: Two times a day (BID) | ORAL | 1 refills | Status: DC

## 2018-08-10 NOTE — Telephone Encounter (Signed)
Non controlled medications submitted.

## 2018-08-10 NOTE — Telephone Encounter (Signed)
Patient last seen 12/19. Requesting refills on all meds. Pt. Uses the [facility] [address].

## 2018-08-10 NOTE — Telephone Encounter (Signed)
Ok to give 1 mo supply w/ 1 RF, and must make appt and keep it within next 2 months.

## 2018-09-22 ENCOUNTER — Ambulatory Visit: Payer: Self-pay | Admitting: *Deleted

## 2018-09-22 ENCOUNTER — Other Ambulatory Visit: Payer: Self-pay | Admitting: Physician Assistant

## 2018-09-22 ENCOUNTER — Telehealth: Payer: Self-pay | Admitting: Physician Assistant

## 2018-09-22 MED ORDER — ZOLPIDEM TARTRATE 10 MG PO TABS: 10.0000 mg | ORAL_TABLET | Freq: Every evening | ORAL | 0 refills | Status: DC | PRN

## 2018-09-22 NOTE — Telephone Encounter (Addendum)
Pt called stating that since 09/18/2018 she had experiencing fevers of 100.0 to 101.8; the pt has also had body aches which have resolved, headaches, cough,and dizziness; she is most concerned about her body aches; the pt states that she would like COVID testing; she answers no to questions on [facility] tool; recommendations made per nurse triage protocol; pt transferred to [facility] [place] for scheduling.  Reason for Disposition . Fever present > 3 days (72 hours)  Answer Assessment - Initial Assessment Questions 1. COVID-19 DIAGNOSIS: "Who made your Coronavirus (COVID-19) diagnosis?" "Was it confirmed by a positive lab test?" If not diagnosed by a HCP, ask "Are there lots of cases (community spread) where you live?" (See public health department website, if unsure)   * MAJOR community spread: high number of cases; numbers of cases are increasing; many people hospitalized.   * MINOR community spread: low number of cases; not increasing; few or no people hospitalized     major 2. ONSET: "When did the COVID-19 symptoms start?"      09/18/2018 3. WORST SYMPTOM: "What is your worst symptom?" (e.g., cough, fever, shortness of breath, muscle aches)    Dizziness with any upward motions 4. COUGH: "Do you have a cough?" If so, ask: "How bad is the cough?"       Yes, non-productive, mild  5. FEVER: "Do you have a fever?" If so, ask: "What is your temperature, how was it measured, and when did it start?"     100.3 09/21/2018 at 0700; took 1 tylenol regular strength  6. RESPIRATORY STATUS: "Describe your breathing?" (e.g., shortness of breath, wheezing, unable to speak)    Intermittent chest tightness 7. BETTER-SAME-WORSE: "Are you getting better, staying the same or getting worse compared to yesterday?"  If getting worse, ask, "In what way?"     same 8. HIGH RISK DISEASE: "Do you have any chronic medical problems?" (e.g., asthma, heart or lung disease, weak immune system, etc.)     high 9.  PREGNANCY: "Is there any chance you are pregnant?" "When was your last menstrual period?"    No irregular period 10. OTHER SYMPTOMS: "Do you have any other symptoms?"  (e.g., runny nose, headache, sore throat, loss of smell)       Headache, sore throat  Protocols used: CORONAVIRUS (COVID-19) DIAGNOSED OR SUSPECTED-A-AH

## 2018-09-22 NOTE — Telephone Encounter (Signed)
Offered an appointment with PA in PCP's absence.   Patient declined and asked to wait for a virtual visit with PCP.   Appointment for Virtual Visit has been made for 09/23/2018 9:00am.    Patient states that she has not been around any known cases, but she has been leaving home to go to the store for food and take her mother to doctor appointments.  Patient states she is quarantined in her room until the appointment, and her family is also not leaving the house until told otherwise.    Routing message to PCP as FYI for her appointment tomorrow.

## 2018-09-22 NOTE — Telephone Encounter (Signed)
Pt requesting refill on Ambien. Former pt of CS not seen by [facility] as of yet. Fill at the [facility]. Stated exhibiting COVID symptoms appt not sched until 6/18.

## 2018-09-22 NOTE — Telephone Encounter (Signed)
Sent  To [facility] [address], which was on chart for prev pharm

## 2018-09-23 ENCOUNTER — Other Ambulatory Visit: Payer: Self-pay

## 2018-09-23 ENCOUNTER — Encounter: Payer: Self-pay | Admitting: Family Medicine

## 2018-09-23 ENCOUNTER — Ambulatory Visit (INDEPENDENT_AMBULATORY_CARE_PROVIDER_SITE_OTHER): Payer: HRSA Program | Admitting: Family Medicine

## 2018-09-23 VITALS — BP 130/80 | HR 82 | Ht 60.0 in | Wt 279.0 lb

## 2018-09-23 DIAGNOSIS — R6889 Other general symptoms and signs: Secondary | ICD-10-CM

## 2018-09-23 DIAGNOSIS — Z20828 Contact with and (suspected) exposure to other viral communicable diseases: Secondary | ICD-10-CM

## 2018-09-23 DIAGNOSIS — Z20822 Contact with and (suspected) exposure to covid-19: Secondary | ICD-10-CM

## 2018-09-23 NOTE — Progress Notes (Signed)
Virtual Visit via Video   I connected with patient on 09/23/18 at  9:00 AM EDT by a video enabled telemedicine application and verified that I am speaking with the correct person using two identifiers.  Location patient: Home Location provider: [place], Office Persons participating in the virtual visit: Patient, Provider, CMA (Jess B)  I discussed the limitations of evaluation and management by telemedicine and the availability of in person appointments. The patient expressed understanding and agreed to proceed.  Subjective:   HPI:  Suspected COVID- sxs started Friday night w/ body aches, fever 101.3.  No change to smell but taste is altered.  + cough.  Has been afebrile since Monday AM.  + SOB and intermittent chest tightness.  No known sick contacts.  Pt has been staying in w/ exception of grocery store and other errands.    ROS:   See pertinent positives and negatives per HPI.  Patient Active Problem List   Diagnosis Date Noted  . Physical exam 08/07/2017  . Headache 06/30/2017  . Chronic right shoulder pain 09/04/2016  . Hypokalemia 04/30/2016  . Hypomagnesemia 04/30/2016  . Alcoholic hepatitis 04/30/2016  . Alcohol induced fatty liver 04/30/2016  . Alcohol abuse 04/29/2016  . Left medial knee pain 06/29/2015  . Acute bacterial sinusitis 10/11/2014  . Maxillary sinusitis, acute 09/14/2014  . Gastroenteritis 11/09/2013  . Pleuritic chest pain 09/17/2013  . Chest pain 11/18/2012  . Morbid obesity (HCC) 09/29/2012  . HTN (hypertension) 07/12/2012  . Migraine 07/12/2012  . Bipolar II disorder, most recent episode major depressive (HCC) 07/11/2012    Social History   Tobacco Use  . Smoking status: Former Smoker    Years: 10.00  . Smokeless tobacco: Never Used  . Tobacco comment: smoked 1994-2012, up to 1 pp WEEK  Substance Use Topics  . Alcohol use: No    Comment: last used ETOH in Jan 2018    Current Outpatient Medications:  .  amLODipine  (NORVASC) 2.5 MG tablet, TAKE 1 TABLET(2.5 MG) BY MOUTH DAILY FOR HIGH BLOOD PRESSURE, Disp: 30 tablet, Rfl: 6 .  buPROPion (WELLBUTRIN XL) 300 MG 24 hr tablet, Take 1 tablet (300 mg total) by mouth every morning., Disp: 30 tablet, Rfl: 1 .  carbamazepine (EQUETRO) 200 MG CP12 12 hr capsule, Take 1 capsule (200 mg total) by mouth 2 (two) times daily., Disp: 60 each, Rfl: 1 .  cholecalciferol (VITAMIN D) 1000 units tablet, Take 1,000 Units by mouth daily., Disp: , Rfl:  .  clonazePAM (KLONOPIN) 1 MG tablet, TAKE 1 TABLET BY MOUTH EVERY MORNING, 1 TABLET EVERY EVENING, AND 1/2 TABLET AT BEDTIME, Disp: 75 tablet, Rfl: 1 .  Multiple Vitamin (MULTIVITAMIN WITH MINERALS) TABS tablet, Take 1 tablet by mouth daily., Disp: , Rfl:  .  traZODone (DESYREL) 100 MG tablet, Take 200 mg by mouth at bedtime., Disp: 60 tablet, Rfl: 1 .  vitamin C (ASCORBIC ACID) 500 MG tablet, Take 500 mg by mouth daily., Disp: , Rfl:  .  metoprolol succinate (TOPROL-XL) 25 MG 24 hr tablet, TAKE 1 TABLET(25 MG) BY MOUTH DAILY (Patient not taking: Reported on 09/23/2018), Disp: 30 tablet, Rfl: 6 .  zolpidem (AMBIEN) 10 MG tablet, Take 1 tablet (10 mg total) by mouth at bedtime as needed for sleep. (Patient not taking: Reported on 09/23/2018), Disp: 30 tablet, Rfl: 0  Allergies  Allergen Reactions  . Methocarbamol   . Toradol [Ketorolac Tromethamine] Other (See Comments)    Pt states it makes her jittery, felt she  had something crawling on her     Objective:   BP 130/80   Pulse 82   Ht 5' (1.524 m)   Wt 279 lb (126.6 kg)   BMI 54.49 kg/m   AAOx3, NAD NCAT, EOMI No obvious CN deficits but wearing N95 mask Coloring WNL Pt is able to speak clearly, coherently without shortness of breath or increased work of breathing.  Thought process is linear.  Mood is appropriate.   Assessment and Plan:   Suspected COVID- pt most likely has COVID.  # given for her to get public testing as she doesn't meet [facility] testing criteria.   Reviewed dx, supportive care, red flags, and return to work criteria.  Will follow.   Neena RhymesKatherine Tabori, MD 09/23/2018

## 2018-09-23 NOTE — Progress Notes (Signed)
I have discussed the procedure for the virtual visit with the patient who has given consent to proceed with assessment and treatment.   Ronalda Walpole L Harden Bramer, CMA     

## 2018-09-30 ENCOUNTER — Other Ambulatory Visit: Payer: Self-pay | Admitting: Physician Assistant

## 2018-10-05 ENCOUNTER — Other Ambulatory Visit: Payer: Self-pay | Admitting: Physician Assistant

## 2018-10-06 NOTE — Telephone Encounter (Signed)
Has appt 06/18 Last visit 03/2018 Last fill 05/10

## 2018-10-15 ENCOUNTER — Ambulatory Visit: Payer: 59 | Admitting: Physician Assistant

## 2018-10-17 ENCOUNTER — Other Ambulatory Visit: Payer: Self-pay | Admitting: Physician Assistant

## 2018-10-17 ENCOUNTER — Other Ambulatory Visit: Payer: Self-pay | Admitting: Family Medicine

## 2018-10-19 NOTE — Telephone Encounter (Signed)
Her appt was cxl 06/18 but not been in since 03/2018 no appt scheduled at this time.

## 2018-10-20 ENCOUNTER — Other Ambulatory Visit: Payer: Self-pay | Admitting: Physician Assistant

## 2018-11-02 ENCOUNTER — Other Ambulatory Visit: Payer: Self-pay | Admitting: Physician Assistant

## 2018-11-02 NOTE — Telephone Encounter (Signed)
Please call and make appt

## 2018-11-02 NOTE — Telephone Encounter (Signed)
Last visit Dec 2019

## 2018-11-06 NOTE — Telephone Encounter (Signed)
Patient made an appointment for 8/18 °

## 2018-11-18 ENCOUNTER — Telehealth: Payer: Self-pay | Admitting: Physician Assistant

## 2018-11-18 ENCOUNTER — Other Ambulatory Visit: Payer: Self-pay | Admitting: Psychiatry

## 2018-11-18 MED ORDER — ZOLPIDEM TARTRATE 10 MG PO TABS: 5.0000 mg | ORAL_TABLET | Freq: Every evening | ORAL | 0 refills | Status: DC | PRN

## 2018-11-18 NOTE — Telephone Encounter (Signed)
Last seen by Comer Locket in December.  Patient is on clonazepam, hydrocodone, and Ambien 10.  We will give enough to get her to the appointment with Donnal Moat on August 18 of the zolpidem.  Prescription sent for number 27 tablets

## 2018-11-18 NOTE — Progress Notes (Signed)
Last seen by Comer Locket in December.  Patient is on clonazepam, hydrocodone, and Ambien 10.  We will give enough to get her to the appointment with Donnal Moat on August 18 of the zolpidem.

## 2018-11-18 NOTE — Telephone Encounter (Signed)
[facility] patient left vm 07/21 @3 :06 need a refill on Zopadin to be sent to [facility] on [address].  Has appointment w/TH 08/18

## 2018-11-24 ENCOUNTER — Other Ambulatory Visit: Payer: Self-pay | Admitting: Physician Assistant

## 2018-11-30 ENCOUNTER — Other Ambulatory Visit: Payer: Self-pay | Admitting: Physician Assistant

## 2018-11-30 NOTE — Telephone Encounter (Signed)
Apt 08/18 Last fill 07/06

## 2018-12-10 ENCOUNTER — Other Ambulatory Visit: Payer: Self-pay | Admitting: Psychiatry

## 2018-12-10 NOTE — Telephone Encounter (Signed)
Has appt next week not due yet

## 2018-12-14 NOTE — Telephone Encounter (Signed)
Has appt tomorrow 08/18, refill due this week as well

## 2018-12-15 ENCOUNTER — Other Ambulatory Visit: Payer: Self-pay

## 2018-12-15 ENCOUNTER — Encounter: Payer: Self-pay | Admitting: Physician Assistant

## 2018-12-15 ENCOUNTER — Ambulatory Visit (INDEPENDENT_AMBULATORY_CARE_PROVIDER_SITE_OTHER): Payer: Self-pay | Admitting: Physician Assistant

## 2018-12-15 DIAGNOSIS — G4700 Insomnia, unspecified: Secondary | ICD-10-CM

## 2018-12-15 DIAGNOSIS — F331 Major depressive disorder, recurrent, moderate: Secondary | ICD-10-CM

## 2018-12-15 DIAGNOSIS — F411 Generalized anxiety disorder: Secondary | ICD-10-CM

## 2018-12-15 MED ORDER — QUETIAPINE FUMARATE 25 MG PO TABS: 25.0000 mg | ORAL_TABLET | Freq: Every evening | ORAL | 1 refills | Status: DC | PRN

## 2018-12-15 NOTE — Progress Notes (Signed)
[facility]  Patient ID: Marilyn Fowler,  MRN: [id number]  PCP: Midge Minium, MD  Date of Evaluation: 12/15/2018 Time spent:15 minutes  Chief Complaint:  Chief Complaint    Insomnia; Anxiety; Depression; Follow-up     Virtual Visit via Telephone Note  I connected with patient by a video enabled telemedicine application or telephone, with their informed consent, and verified patient privacy and that I am speaking with the correct person using two identifiers.  I am private, in my home and the patient is home.  I discussed the limitations, risks, security and privacy concerns of performing an evaluation and management service by telephone and the availability of in person appointments. I also discussed with the patient that there may be a patient responsible charge related to this service. The patient expressed understanding and agreed to proceed.   I discussed the assessment and treatment plan with the patient. The patient was provided an opportunity to ask questions and all were answered. The patient agreed with the plan and demonstrated an understanding of the instructions.   The patient was advised to call back or seek an in-person evaluation if the symptoms worsen or if the condition fails to improve as anticipated.  I provided 15 minutes of non-face-to-face time during this encounter.  HISTORY/CURRENT STATUS: HPI For routine med check.  Not sleeping much again.  Feels that the Trazodone is no longer working as well as it did.  She took Seroquel in the past and that helped with sleep a lot but she gained a bunch of weight.  She thinks she took around 300 mg at that time.  She has had to go off of the Equetro due to cost.  States it is been about 8 months.  She has had no symptoms of increased energy with decreased need for sleep.  No impulsivity, risky behavior, increased libido, or increased spending.  No grandiosity.  No hallucinations.  She is able to enjoy  things.  She is isolating due to the coronavirus pandemic.  Energy and motivation are good.  She sleeps well.  She is not working at the present time.  She lost her job and is looking for something else.  Denies suicidal or homicidal thoughts.  Anxiety has still been bad but the Klonopin does help.  Says she is just an anxious person.  With everything that is going on in the world it has made it worse.  She does not feel the need to change any meds however at this time.  Denies dizziness, syncope, seizures, numbness, tingling, tremor, tics, unsteady gait, slurred speech, confusion. Denies muscle or joint pain, stiffness, or dystonia.  Individual Medical History/ Review of Systems: Changes? :Yes  chronic knee pain after injury last fall.   Past medications for mental health diagnoses include: Seroquel, Equetro  Allergies: Methocarbamol and Toradol [ketorolac tromethamine]  Current Medications:  Current Outpatient Medications:  .  amLODipine (NORVASC) 2.5 MG tablet, TAKE 1 TABLET(2.5 MG) BY MOUTH DAILY FOR HIGH BLOOD PRESSURE, Disp: 30 tablet, Rfl: 6 .  buPROPion (WELLBUTRIN XL) 300 MG 24 hr tablet, Take 1 tablet (300 mg total) by mouth every morning., Disp: 30 tablet, Rfl: 1 .  clonazePAM (KLONOPIN) 1 MG tablet, TAKE 1 TABLET BY MOUTH EVERY MORNING, 1 TABLET EVERY EVENING AND 1/2 TABLET AT BEDTIME, Disp: 75 tablet, Rfl: 0 .  Multiple Vitamin (MULTIVITAMIN WITH MINERALS) TABS tablet, Take 1 tablet by mouth daily., Disp: , Rfl:  .  traZODone (DESYREL) 100 MG tablet, TAKE  2 TABLETS BY MOUTH AT BEDTIME, Disp: 60 tablet, Rfl: 1 .  vitamin C (ASCORBIC ACID) 500 MG tablet, Take 500 mg by mouth daily., Disp: , Rfl:  .  zolpidem (AMBIEN) 10 MG tablet, TAKE 1/2 TO 1 TABLET(5 TO 10 MG) BY MOUTH AT BEDTIME AS NEEDED FOR SLEEP, Disp: 30 tablet, Rfl: 1 .  cholecalciferol (VITAMIN D) 1000 units tablet, Take 1,000 Units by mouth daily., Disp: , Rfl:  .  metoprolol succinate (TOPROL-XL) 25 MG 24 hr tablet, TAKE  1 TABLET(25 MG) BY MOUTH DAILY (Patient not taking: Reported on 09/23/2018), Disp: 30 tablet, Rfl: 6 .  QUEtiapine (SEROQUEL) 25 MG tablet, Take 1-2 tablets (25-50 mg total) by mouth at bedtime as needed., Disp: 60 tablet, Rfl: 1 Medication Side Effects: none  Family Medical/ Social History: Changes? No longer working.  She is looking for a job.  MENTAL HEALTH EXAM:  There were no vitals taken for this visit.There is no height or weight on file to calculate BMI.  General Appearance: unable to assess  Eye Contact:  unable to assess  Speech:  Clear and Coherent  Volume:  Normal  Mood:  Euthymic  Affect:  unable to assess  Thought Process:  Goal Directed  Orientation:  Full (Time, Place, and Person)  Thought Content: Logical   Suicidal Thoughts:  No  Homicidal Thoughts:  No  Memory:  WNL  Judgement:  Good  Insight:  Good  Psychomotor Activity:  unable to assess  Concentration:  Concentration: Good  Recall:  Good  Fund of Knowledge: Good  Language: Good  Assets:  Desire for Improvement  ADL's:  Intact  Cognition: WNL  Prognosis:  Good    DIAGNOSES:    ICD-10-CM   1. Insomnia, unspecified type  G47.00   2. Major depressive disorder, recurrent episode, moderate (HCC)  F33.1   3. Generalized anxiety disorder  F41.1     Receiving Psychotherapy: No    RECOMMENDATIONS:  We discussed adding Seroquel back in for sleep.  We can use a low dose and hopefully that will help to sleep without causing increased hunger and weight gain.  She would like to try it. If needed, we will add Tegretol, generic, back nightly.  At this point, she and I do not think it is necessary.  Specifically because she noticed no difference being off of the Equetro plus she has no insurance, and even though the generic would be cheaper, she will still need labs which can be expensive.  She will let me know if we need to add it back in. Start Seroquel 25 mg, 1-2 nightly as needed.  Good Rx information  given Continue Wellbutrin XL 300 mg every morning. Continue Klonopin 1 mg twice daily as needed and an extra half during the day as needed.  PDMP was reviewed. Continue trazodone 100 mg, 1-2 nightly as needed sleep.  If Seroquel is beneficial then she can stop this.  If she needs both however she can take them both. Continue Ambien 10 mg nightly as needed sleep. Return in 3 months.  Melony Overlyeresa Jakyrie Totherow, PA-C   This record has been created using [employer] software.  Chart creation errors have been sought, but may not always have been located and corrected. Such creation errors do not reflect on the standard of medical care.

## 2018-12-28 ENCOUNTER — Telehealth: Payer: Self-pay | Admitting: Physician Assistant

## 2018-12-28 ENCOUNTER — Other Ambulatory Visit: Payer: Self-pay

## 2018-12-28 MED ORDER — CLONAZEPAM 1 MG PO TABS: ORAL_TABLET | ORAL | 2 refills | Status: DC

## 2018-12-28 NOTE — Telephone Encounter (Signed)
Last refill 11/30/2018, next appt 03/15/2019

## 2018-12-28 NOTE — Telephone Encounter (Signed)
Please send in RF Klonopin to [facility], [address] [place]

## 2019-01-15 ENCOUNTER — Telehealth: Payer: Self-pay | Admitting: Physician Assistant

## 2019-01-15 ENCOUNTER — Other Ambulatory Visit: Payer: Self-pay | Admitting: Physician Assistant

## 2019-01-15 ENCOUNTER — Other Ambulatory Visit: Payer: Self-pay | Admitting: Psychiatry

## 2019-01-15 NOTE — Telephone Encounter (Signed)
Patient left vm today @11 :22 she is in quarentine for COVID-19, was taking Clonazepam but it's not helping would like something else to take for anti-anxiety for at least 3 weeks until she's out of quarentine.

## 2019-01-15 NOTE — Telephone Encounter (Signed)
ERROR

## 2019-01-15 NOTE — Telephone Encounter (Signed)
She just got Klonopin RX less than 3 weeks ago.  I won't change anything.  If needed she can discuss with Helene Kelp when she returns next week

## 2019-01-15 NOTE — Telephone Encounter (Signed)
She is on 200 mg of trazodone and 10 mg of Ambien.  Ambien cannot be increased.  She can try 300 mg of trazodone if she wants through the weekend and see how it does.  I will not prescribe anything new.

## 2019-01-18 NOTE — Telephone Encounter (Signed)
Did she restart the low dose Seroquel to help w/ sleep?  That was part of our plan at the last visit.  If so and it's not helpful, can go to 50 mg.  Other options are to change from [place] to [place] (I don't think she's every tried) or change Ambien to [place].  Please discuss w/ her. Thanks

## 2019-01-18 NOTE — Telephone Encounter (Signed)
Left voicemail on cell phone to call back to discuss

## 2019-02-05 ENCOUNTER — Other Ambulatory Visit: Payer: Self-pay | Admitting: Physician Assistant

## 2019-02-05 NOTE — Telephone Encounter (Signed)
appt 11/16

## 2019-02-08 ENCOUNTER — Other Ambulatory Visit: Payer: Self-pay | Admitting: General Practice

## 2019-02-08 MED ORDER — METOPROLOL SUCCINATE ER 25 MG PO TB24: ORAL_TABLET | ORAL | 6 refills | Status: DC

## 2019-02-11 ENCOUNTER — Telehealth: Payer: Self-pay | Admitting: Physician Assistant

## 2019-02-11 NOTE — Telephone Encounter (Signed)
Please call her and have her increase the Seroquel 25 mg up to 2 or 3 pills nightly to help sleep.  Take it with melatonin 5 mg, also Ambien 10 mg if she needs all that is okay to take together for a few nights.

## 2019-02-11 NOTE — Telephone Encounter (Signed)
Pt left v-mail stating she had COVID about month ago. Her meds are not working, been up for 4 days. Seroquel not working. Got to get some sleep.  Please return call @ [phone]

## 2019-02-11 NOTE — Telephone Encounter (Signed)
Pt would like to try Lunesta for sleep. She has not slept in 4 days. She is getting over Covid and may think it has something to do with that. Send to [facility] on [address]

## 2019-02-11 NOTE — Telephone Encounter (Signed)
Pt. Made aware and verbalized understanding.

## 2019-02-15 NOTE — Telephone Encounter (Signed)
Please check on her.  Has she slept better over the weekend since increasing the Seroquel?  If so, I won't send in [place].  If not, I'll send it in.

## 2019-02-15 NOTE — Telephone Encounter (Signed)
Left her a VM to return my call.

## 2019-02-15 NOTE — Telephone Encounter (Signed)
See prev note

## 2019-02-16 NOTE — Telephone Encounter (Signed)
She stated she has not slept but maybe 2 hours a night. She has tried benadryl, and melatonin. She stated she tested positive for Covid 4 weeks ago and is still testing positive. She said not sure if that is the reason. Please send Lunesta to [facility] at [address]. Thanks.

## 2019-02-17 ENCOUNTER — Other Ambulatory Visit: Payer: Self-pay | Admitting: Physician Assistant

## 2019-02-17 MED ORDER — ESZOPICLONE 2 MG PO TABS: 2.0000 mg | ORAL_TABLET | Freq: Every evening | ORAL | 0 refills | Status: DC | PRN

## 2019-02-17 NOTE — Telephone Encounter (Signed)
Lunesta sent in 

## 2019-03-15 ENCOUNTER — Ambulatory Visit: Payer: Self-pay | Admitting: Physician Assistant

## 2019-03-17 ENCOUNTER — Other Ambulatory Visit: Payer: Self-pay | Admitting: Physician Assistant

## 2019-03-17 NOTE — Telephone Encounter (Signed)
Last apt in August, had changes with phone messages added Marilyn Fowler, suppose to continue trazodone as well?

## 2019-03-21 ENCOUNTER — Other Ambulatory Visit: Payer: Self-pay | Admitting: Physician Assistant

## 2019-03-22 NOTE — Telephone Encounter (Signed)
Last apt 11/2018,nothing scheduled 

## 2019-03-23 ENCOUNTER — Other Ambulatory Visit: Payer: Self-pay | Admitting: Physician Assistant

## 2019-03-23 ENCOUNTER — Telehealth: Payer: Self-pay | Admitting: Physician Assistant

## 2019-03-23 MED ORDER — CLONAZEPAM 1 MG PO TABS: ORAL_TABLET | ORAL | 2 refills | Status: DC

## 2019-03-23 NOTE — Telephone Encounter (Signed)
She's on Klonopin.  I sent Rx w/ RF to [facility] [address] in [facility]

## 2019-03-23 NOTE — Telephone Encounter (Signed)
Pt didn't realize she had an appt scheduled in Nov. Sorry she missed it- I did update her phone number. Can we call in the Xanax for her because next avail appt in Jan. To [facility]

## 2019-03-23 NOTE — Telephone Encounter (Signed)
Noted thank you

## 2019-04-07 ENCOUNTER — Other Ambulatory Visit: Payer: Self-pay | Admitting: Physician Assistant

## 2019-04-07 ENCOUNTER — Telehealth: Payer: Self-pay | Admitting: Physician Assistant

## 2019-04-07 NOTE — Telephone Encounter (Signed)
error 

## 2019-04-07 NOTE — Telephone Encounter (Signed)
Looks like Ambien RF was denied. Can you tell pt why? I can't tell from the notes what she should be on?

## 2019-04-08 ENCOUNTER — Other Ambulatory Visit: Payer: Self-pay | Admitting: Physician Assistant

## 2019-04-08 MED ORDER — ZOLPIDEM TARTRATE 10 MG PO TABS: 10.0000 mg | ORAL_TABLET | Freq: Every evening | ORAL | 1 refills | Status: DC | PRN

## 2019-04-08 NOTE — Telephone Encounter (Signed)
FYI patient had Covid the end of November is why she didn't make apt she said

## 2019-04-08 NOTE — Telephone Encounter (Signed)
Rx sent 

## 2019-04-08 NOTE — Telephone Encounter (Signed)
Please call her and let her know she missed appt in Nov (see phone notes) and that's the reason Ambien was denied. I will send it in now (let me know the pharm) since she made appt for Jan.  Thanks

## 2019-04-30 ENCOUNTER — Encounter (HOSPITAL_BASED_OUTPATIENT_CLINIC_OR_DEPARTMENT_OTHER): Payer: Self-pay

## 2019-04-30 ENCOUNTER — Other Ambulatory Visit: Payer: Self-pay

## 2019-04-30 ENCOUNTER — Emergency Department (HOSPITAL_BASED_OUTPATIENT_CLINIC_OR_DEPARTMENT_OTHER)
Admission: EM | Admit: 2019-04-30 | Discharge: 2019-04-30 | Disposition: A | Discharge status: Home / Self Care | Payer: Self-pay | Attending: Emergency Medicine | Admitting: Emergency Medicine

## 2019-04-30 ENCOUNTER — Emergency Department (HOSPITAL_BASED_OUTPATIENT_CLINIC_OR_DEPARTMENT_OTHER): Payer: Self-pay

## 2019-04-30 DIAGNOSIS — Y92009 Unspecified place in unspecified non-institutional (private) residence as the place of occurrence of the external cause: Secondary | ICD-10-CM | POA: Insufficient documentation

## 2019-04-30 DIAGNOSIS — S76011A Strain of muscle, fascia and tendon of right hip, initial encounter: Secondary | ICD-10-CM

## 2019-04-30 DIAGNOSIS — Y939 Activity, unspecified: Secondary | ICD-10-CM | POA: Insufficient documentation

## 2019-04-30 DIAGNOSIS — S76001A Unspecified injury of muscle, fascia and tendon of right hip, initial encounter: Secondary | ICD-10-CM | POA: Insufficient documentation

## 2019-04-30 DIAGNOSIS — Y999 Unspecified external cause status: Secondary | ICD-10-CM | POA: Insufficient documentation

## 2019-04-30 DIAGNOSIS — Z87891 Personal history of nicotine dependence: Secondary | ICD-10-CM | POA: Insufficient documentation

## 2019-04-30 DIAGNOSIS — Z885 Allergy status to narcotic agent status: Secondary | ICD-10-CM | POA: Insufficient documentation

## 2019-04-30 DIAGNOSIS — Z79899 Other long term (current) drug therapy: Secondary | ICD-10-CM | POA: Insufficient documentation

## 2019-04-30 DIAGNOSIS — Z888 Allergy status to other drugs, medicaments and biological substances status: Secondary | ICD-10-CM | POA: Insufficient documentation

## 2019-04-30 DIAGNOSIS — X509XXA Other and unspecified overexertion or strenuous movements or postures, initial encounter: Secondary | ICD-10-CM | POA: Insufficient documentation

## 2019-04-30 DIAGNOSIS — M25551 Pain in right hip: Secondary | ICD-10-CM

## 2019-04-30 DIAGNOSIS — I10 Essential (primary) hypertension: Secondary | ICD-10-CM | POA: Insufficient documentation

## 2019-04-30 DIAGNOSIS — M79604 Pain in right leg: Secondary | ICD-10-CM | POA: Insufficient documentation

## 2019-04-30 LAB — PREGNANCY, URINE: Preg Test, Ur: NEGATIVE

## 2019-04-30 MED ORDER — HYDROCODONE-ACETAMINOPHEN 5-325 MG PO TABS
2.0000 | ORAL_TABLET | Freq: Once | ORAL | Status: AC
  Administered 2019-04-30: 20:00:00 2 via ORAL
  Filled 2019-04-30: qty 2

## 2019-04-30 MED ORDER — LIDOCAINE 5 % EX PTCH: 1.0000 | MEDICATED_PATCH | CUTANEOUS | 0 refills | Status: DC

## 2019-04-30 MED ORDER — CYCLOBENZAPRINE HCL 5 MG PO TABS: 5.0000 mg | ORAL_TABLET | Freq: Two times a day (BID) | ORAL | 0 refills | Status: DC | PRN

## 2019-04-30 NOTE — Discharge Instructions (Addendum)
You have been diagnosed today with right hip pain, muscle strain of the right gluteal muscles.  At this time there does not appear to be the presence of an emergent medical condition, however there is always the potential for conditions to change. Please read and follow the below instructions.  Please return to the Emergency Department immediately for any new or worsening symptoms. Please be sure to follow up with your Primary Care Provider within one week regarding your visit today; please call their office to schedule an appointment even if you are feeling better for a follow-up visit. You may use the muscle relaxer Flexeril as prescribed to help with your symptoms.  Do not drive or operate heavy machinery while taking Flexeril as it will make you drowsy.  Do not drink alcohol or take other sedating medications while taking Flexeril as this will worsen side effects. You may continue using the anti-inflammatories Tylenol and ibuprofen as directed on the packaging to help with your symptoms. You may use the Lidoderm patch prescribed today to help with your symptoms. You have been given a pain medication called Norco today.  Do not drive or perform any dangerous activities as Norco will make you drowsy.  Do not drink alcohol or take any other sedating medications with Norco as this will worsen side effects.  Norco contains Tylenol, do not take Tylenol for the rest the day.   Please read the additional information packets attached to your discharge summary.  Do not take your medicine if  develop an itchy rash, swelling in your mouth or lips, or difficulty breathing; call 911 and seek immediate emergency medical attention if this occurs.  Note: Portions of this text may have been transcribed using voice recognition software. Every effort was made to ensure accuracy; however, inadvertent computerized transcription errors may still be present.

## 2019-04-30 NOTE — ED Triage Notes (Signed)
Pt states she woke this am with pain to right hip and thigh area-denies injury-NAD-limping gait

## 2019-04-30 NOTE — ED Notes (Signed)
Patient transported to X-ray 

## 2019-04-30 NOTE — ED Provider Notes (Signed)
[facility] EMERGENCY DEPARTMENT Provider Note   CSN: [id number] Arrival date & time: 04/30/19  1439     History Chief Complaint  Patient presents with   Hip Pain    Marilyn Fowler is a 44 y.o. female history of obesity, hypertension, migraines, alcohol abuse, bipolar.  Patient presents today for right-sided hip pain that began this morning upon waking.  Patient denies any injury last night, she reports she developed a mild intensity throbbing sensation to her right lateral hip this morning worsened with palpation and movement and slightly improved with Tylenol/ibuprofen.  Pain will occasionally radiate down the outside of her leg.  She denies any bruising, fall/injury.  Patient has any fever/chills, fall/injury, headache, vision changes, neck stiffness, neck pain, back pain, chest pain, shortness of breath, abdominal pain, nausea/vomiting, bowel/bladder incontinence, urinary retention, saddle or paresthesias, numbness/weakness, tingling, IV drug use, history of cancer and or any additional concerns.  HPI     Past Medical History:  Diagnosis Date   Alcohol abuse    Anemia    Anxiety    Blood transfusion without reported diagnosis    Depression    Hemorrhoid    Hypertension    Migraine     Patient Active Problem List   Diagnosis Date Noted   Physical exam 08/07/2017   Headache 06/30/2017   Chronic right shoulder pain 09/04/2016   Hypokalemia 04/30/2016   Hypomagnesemia 04/30/2016   Alcoholic hepatitis 04/30/2016   Alcohol induced fatty liver 04/30/2016   Alcohol abuse 04/29/2016   Left medial knee pain 06/29/2015   Acute bacterial sinusitis 10/11/2014   Maxillary sinusitis, acute 09/14/2014   Gastroenteritis 11/09/2013   Pleuritic chest pain 09/17/2013   Chest pain 11/18/2012   Morbid obesity (HCC) 09/29/2012   HTN (hypertension) 07/12/2012   Migraine 07/12/2012   Bipolar II disorder, most recent episode major depressive (HCC)  07/11/2012    Past Surgical History:  Procedure Laterality Date   HEMORRHOID SURGERY     HERNIA REPAIR     TONSILLECTOMY     VENTRAL HERNIA REPAIR       OB History   No obstetric history on file.     Family History  Problem Relation Age of Onset   Osteoarthritis Mother    Diabetes Mother    Hypertension Mother    Depression Mother    Hypertension Father    Diabetes Father    Asthma Son    Deep vein thrombosis Maternal Grandmother    Deep vein thrombosis Maternal Grandfather    Stroke Neg Hx     Social History   Tobacco Use   Smoking status: Former Smoker    Years: 10.00   Smokeless tobacco: Never Used   Tobacco comment: smoked 1994-2012, up to 1 pp WEEK  Substance Use Topics   Alcohol use: No   Drug use: No    Home Medications Prior to Admission medications   Medication Sig Start Date End Date Taking? Authorizing Provider  amLODipine (NORVASC) 2.5 MG tablet TAKE 1 TABLET(2.5 MG) BY MOUTH DAILY FOR HIGH BLOOD PRESSURE 10/19/18   Sheliah Hatch, MD  buPROPion (WELLBUTRIN XL) 300 MG 24 hr tablet Take 1 tablet (300 mg total) by mouth every morning. 08/10/18   Melony Overly T, PA-C  clonazePAM (KLONOPIN) 1 MG tablet 1 mg bid prn and occas extra 1/2 po mid-day prn. 03/23/19   Hurst, Rosey Bath T, PA-C  cyclobenzaprine (FLEXERIL) 5 MG tablet Take 1 tablet (5 mg total) by mouth 2 (two) times  daily as needed for muscle spasms. 04/30/19   Nuala Alpha A, PA-C  eszopiclone (LUNESTA) 2 MG TABS tablet TAKE 1 TABLET BY MOUTH AT BEDTIME AS NEEDED FOR SLEEP. TAKE IMMEDIATELY BEFORE BEDTIME 03/18/19   Donnal Moat T, PA-C  lidocaine (LIDODERM) 5 % Place 1 patch onto the skin daily. Remove & Discard patch within 12 hours or as directed by MD 04/30/19   Nuala Alpha A, PA-C  metoprolol succinate (TOPROL-XL) 25 MG 24 hr tablet TAKE 1 TABLET(25 MG) BY MOUTH DAILY 02/08/19   Midge Minium, MD  QUEtiapine (SEROQUEL) 25 MG tablet Take 1-2 tablets (25-50 mg  total) by mouth at bedtime as needed. 12/15/18   Donnal Moat T, PA-C  zolpidem (AMBIEN) 10 MG tablet Take 1 tablet (10 mg total) by mouth at bedtime as needed for sleep. 04/08/19 05/08/19  Addison Lank, PA-C    Allergies    Methocarbamol and Toradol [ketorolac tromethamine]  Review of Systems   Review of Systems Ten systems are reviewed and are negative for acute change except as noted in the HPI  Physical Exam Updated Vital Signs BP 132/82    Pulse 73    Temp 98.8 F (37.1 C) (Oral)    Resp 20    Ht 5' (1.524 m)    Wt 132.9 kg    LMP 04/09/2019    SpO2 100%    BMI 57.22 kg/m   Physical Exam Constitutional:      General: She is not in acute distress.    Appearance: Normal appearance. She is well-developed. She is obese. She is not ill-appearing or diaphoretic.  HENT:     Head: Normocephalic and atraumatic.     Right Ear: External ear normal.     Left Ear: External ear normal.     Nose: Nose normal.  Eyes:     General: Vision grossly intact. Gaze aligned appropriately.     Pupils: Pupils are equal, round, and reactive to light.  Neck:     Trachea: Trachea and phonation normal. No tracheal deviation.  Cardiovascular:     Pulses:          Dorsalis pedis pulses are 2+ on the right side and 2+ on the left side.  Pulmonary:     Effort: Pulmonary effort is normal. No respiratory distress.  Abdominal:     General: There is no distension.     Palpations: Abdomen is soft.     Tenderness: There is no abdominal tenderness. There is no guarding or rebound.  Musculoskeletal:        General: Normal range of motion.     Cervical back: Normal range of motion.     Right lower leg: No edema.     Left lower leg: No edema.     Comments: No midline C/T/L spinal tenderness to palpation, no paraspinal muscle tenderness, no deformity, crepitus, or step-off noted. No sign of injury to the neck or back. - Hips stable to compression bilaterally without pain.  Patient is able to stand and ambulate  without assistance. - Tenderness to palpation over right gluteal musculature without overlying skin change.  Movement of knee, ankle and foot intact without pain.  Feet:     Right foot:     Protective Sensation: 5 sites tested. 5 sites sensed.     Left foot:     Protective Sensation: 5 sites tested. 5 sites sensed.  Skin:    General: Skin is warm and dry.     Capillary  Refill: Capillary refill takes less than 2 seconds.     Comments: Bilateral lower extremities appear equal in size and color.  Neurological:     Mental Status: She is alert.     GCS: GCS eye subscore is 4. GCS verbal subscore is 5. GCS motor subscore is 6.     Comments: Speech is clear and goal oriented, follows commands Major Cranial nerves without deficit, no facial droop Normal strength in upper and lower extremities bilaterally including dorsiflexion and plantar flexion, strong and equal grip strength Sensation normal to light and sharp touch Moves extremities without ataxia, coordination intact DTR 2+ bilateral patella, no clonus of the feet  Psychiatric:        Behavior: Behavior normal.     ED Results / Procedures / Treatments   Labs (all labs ordered are listed, but only abnormal results are displayed) Labs Reviewed  PREGNANCY, URINE    EKG None  Radiology DG Lumbar Spine Complete  Result Date: 04/30/2019 CLINICAL DATA:  woke this am with pain to right hip and thigh area, denies injury. EXAM: LUMBAR SPINE - COMPLETE 4+ VIEW COMPARISON:  Lumbar spine radiographs 02/19/2010 FINDINGS: Lumbarization of S1. There is no evidence of lumbar spine fracture. Alignment is normal. Mild intervertebral disc space loss at L5-S1. There main intervertebral disc spaces are maintained. No focal bony lesion. SI joints are open. Nonobstructive bowel gas pattern. IMPRESSION: 1. No acute bony abnormality. 2. Mild degenerative disc disease in the lower lumbar spine. Electronically Signed   By: Emmaline Kluver M.D.   On:  04/30/2019 17:30   US Venous Img Lower Right (DVT Study)  Result Date: 04/30/2019 CLINICAL DATA:  Right leg pain EXAM: RIGHT LOWER EXTREMITY VENOUS DOPPLER ULTRASOUND TECHNIQUE: Gray-scale sonography with graded compression, as well as color Doppler and duplex ultrasound were performed to evaluate the lower extremity deep venous systems from the level of the common femoral vein and including the common femoral, femoral, profunda femoral, popliteal and calf veins including the posterior tibial, peroneal and gastrocnemius veins when visible. The superficial great saphenous vein was also interrogated. Spectral Doppler was utilized to evaluate flow at rest and with distal augmentation maneuvers in the common femoral, femoral and popliteal veins. COMPARISON:  None. FINDINGS: Contralateral Common Femoral Vein: Respiratory phasicity is normal and symmetric with the symptomatic side. No evidence of thrombus. Normal compressibility. Common Femoral Vein: No evidence of thrombus. Normal compressibility, respiratory phasicity and response to augmentation. Saphenofemoral Junction: No evidence of thrombus. Normal compressibility and flow on color Doppler imaging. Profunda Femoral Vein: No evidence of thrombus. Normal compressibility and flow on color Doppler imaging. Femoral Vein: No evidence of thrombus. Normal compressibility, respiratory phasicity and response to augmentation. Popliteal Vein: No evidence of thrombus. Normal compressibility, respiratory phasicity and response to augmentation. Calf Veins: No evidence of thrombus. Normal compressibility and flow on color Doppler imaging. Superficial Great Saphenous Vein: No evidence of thrombus. Normal compressibility. Venous Reflux:  None. Other Findings:  None. IMPRESSION: No evidence of deep venous thrombosis. Electronically Signed   By: Alcide Clever M.D.   On: 04/30/2019 18:48   DG Hip Unilat  With Pelvis 2-3 Views Right  Result Date: 04/30/2019 CLINICAL DATA:  Right  hip pain, no known injury, initial encounter EXAM: DG HIP (WITH OR WITHOUT PELVIS) 3V RIGHT COMPARISON:  None. FINDINGS: There is no evidence of hip fracture or dislocation. There is no evidence of arthropathy or other focal bone abnormality. IMPRESSION: No acute abnormality noted. Electronically Signed   By:  Alcide CleverMark  Lukens M.D.   On: 04/30/2019 17:34    Procedures Procedures (including critical care time)  Medications Ordered in ED Medications  HYDROcodone-acetaminophen (NORCO/VICODIN) 5-325 MG per tablet 2 tablet (has no administration in time range)    ED Course  I have reviewed the triage vital signs and the nursing notes.  Pertinent labs & imaging results that were available during my care of the patient were reviewed by me and considered in my medical decision making (see chart for details).    MDM Rules/Calculators/A&P                     44 year old female arrives today with right gluteal muscular tenderness to palpation onset this morning no prior injury.  Physical examination reassuring, cranial nerves intact, no meningeal signs, no abdominal tenderness, no tenderness of the back, no midline tenderness, step-off, crepitus or deformity.  Hips stable to compression bilaterally.  She has muscular tenderness to palpation of the right gluteal muscles.  She has appropriate range of motion and strength with movements of the bilateral knees, ankles, feet.  7 patient pain with motion of the right hip.  No overlying skin changes to suggest a cellulitis or traumatic injury.  She is good capillary refill and sensation to all toes, pedal pulses equal intact bilaterally.  Compartments soft.  No obvious swelling to the extremity however difficult to tell due to body habitus, lower suspicion for DVT however as patient does not recall any history of trauma will obtain ultrasound to rule out..  Additionally she denies any pelvic or abdominal pain, tenderness is on the lateral hip overlying gluteal muscles.   She denies any neurologic symptoms and has no cauda equina-like symptoms, no red flags concerning for AAA, epidural abscess or other emergent pathologies.  Will obtain plain film x-rays and urinalysis. - Urine Pregnancy Negative  DG Right hip with Pelvis: IMPRESSION:  No acute abnormality noted.   DG lumbar spine:  IMPRESSION:  1. No acute bony abnormality.  2. Mild degenerative disc disease in the lower lumbar spine.   RLE US:  IMPRESSION:  No evidence of deep venous thrombosis.   - Patient reassessed resting comfortably reading a book on her phone.  She states understanding of imaging as above and has no further questions.  Suspect patient's symptoms today secondary to muscular strain of the right gluteal muscles.  Will treat with muscle relaxers, Robaxin 500 mg twice daily.  She will continue her OTC anti-inflammatories as directed on the packaging.  She will be given Lidoderm patches for comfort.  She plans to call her primary care doctor's office tomorrow morning to schedule a follow-up appointment.  Patient given 2 Norco for her acute pain here in the ED, she has a ride home.  Narcotic precautions given to patient.  At this time there does not appear to be any evidence of an acute emergency medical condition and the patient appears stable for discharge with appropriate outpatient follow up. Diagnosis was discussed with patient who verbalizes understanding of care plan and is agreeable to discharge. I have discussed return precautions with patient who verbalizes understanding of return precautions. Patient encouraged to follow-up with their PCP. All questions answered.  Note: Portions of this report may have been transcribed using voice recognition software. Every effort was made to ensure accuracy; however, inadvertent computerized transcription errors may still be present. Final Clinical Impression(s) / ED Diagnoses Final diagnoses:  Right hip pain  Muscle strain of right gluteal  region,  initial encounter    Rx / DC Orders ED Discharge Orders         Ordered    cyclobenzaprine (FLEXERIL) 5 MG tablet  2 times daily PRN     04/30/19 1925    lidocaine (LIDODERM) 5 %  Every 24 hours     04/30/19 1925           Elizabeth Palau 04/30/19 1926    Alvira Monday, MD 05/02/19 2155

## 2019-05-05 ENCOUNTER — Encounter: Payer: Self-pay | Admitting: Physician Assistant

## 2019-05-05 ENCOUNTER — Ambulatory Visit (INDEPENDENT_AMBULATORY_CARE_PROVIDER_SITE_OTHER): Payer: Self-pay | Admitting: Physician Assistant

## 2019-05-05 DIAGNOSIS — F411 Generalized anxiety disorder: Secondary | ICD-10-CM

## 2019-05-05 DIAGNOSIS — F3181 Bipolar II disorder: Secondary | ICD-10-CM

## 2019-05-05 DIAGNOSIS — G4700 Insomnia, unspecified: Secondary | ICD-10-CM

## 2019-05-05 MED ORDER — BUPROPION HCL ER (XL) 300 MG PO TB24: 300.0000 mg | ORAL_TABLET | ORAL | 5 refills | Status: DC

## 2019-05-05 MED ORDER — TRAZODONE HCL 100 MG PO TABS: 100.0000 mg | ORAL_TABLET | Freq: Every evening | ORAL | 5 refills | Status: DC | PRN

## 2019-05-05 MED ORDER — ZOLPIDEM TARTRATE 10 MG PO TABS: 10.0000 mg | ORAL_TABLET | Freq: Every evening | ORAL | 5 refills | Status: DC | PRN

## 2019-05-05 MED ORDER — CLONAZEPAM 1 MG PO TABS: ORAL_TABLET | ORAL | 5 refills | Status: DC

## 2019-05-05 NOTE — Progress Notes (Signed)
[facility] Check  Patient ID: Marilyn Fowler,  MRN: [id number]  PCP: Marilyn Hatch, MD  Date of Evaluation: 05/05/2019 Time spent:15 minutes  Chief Complaint:  Chief Complaint    Anxiety; Depression; Insomnia; Follow-up      HISTORY/CURRENT STATUS: HPI For routine med check.  Biggest prob is sleep.  She does not have health insurance and has not been able to get a sleep study which she and Marilyn Fowler, [place] had discussed.  States that her mind races and even if she is asleep it seems that she cannot get her thoughts to stop.  She does not have nightmares but feels like what ever dreams she has, she wakes up feeling like she has been living a part of that dream.  She has always had trouble sleeping and states several members in her family have trouble as well.  We had retry the low-dose of Seroquel at the last visit but it was not effective.  When she took higher doses, it caused a lot of weight gain and she does not want to retry that.  She was taking Ambien and trazodone together which did help some.  However she had trouble getting the Ambien for some reason back last fall so I discontinued the Ambien and started Lunesta.  However I was not aware that she still had Ambien so has been taking the 2 together.  With those she gets about 6 hours of sleep.  Some days she feels rested when she gets up and other days she does not.  She still does not have a job.  Her mood is okay but when she does not get enough rest for a few days in a row she gets more depressed.  She is able to enjoy things when she has something to do.  Energy and motivation are good.  Not crying easily.  Denies suicidal or homicidal thoughts.  Patient denies increased energy with decreased need for sleep, no increased talkativeness, no racing thoughts, no impulsivity or risky behaviors, no increased spending, no increased libido, no grandiosity.  Anxiety is well controlled for the most part.  She does need the  Klonopin every day and states it does continue to help.  Denies dizziness, syncope, seizures, numbness, tingling, tremor, tics, unsteady gait, slurred speech, confusion. Denies muscle or joint pain, stiffness, or dystonia.  Individual Medical History/ Review of Systems: Changes? :Yes  Hurt her leg a few days ago.  Was given a muscle relaxer and Lidoderm cream.  Pain is a lot better now and is not needing those medications.  Past medications for mental health diagnoses include: Seroquel, Equetro, trazodone, Ambien, Lunesta  Allergies: Methocarbamol and Toradol [ketorolac tromethamine]  Current Medications:  Current Outpatient Medications:  .  amLODipine (NORVASC) 2.5 MG tablet, TAKE 1 TABLET(2.5 MG) BY MOUTH DAILY FOR HIGH BLOOD PRESSURE, Disp: 30 tablet, Rfl: 6 .  buPROPion (WELLBUTRIN XL) 300 MG 24 hr tablet, Take 1 tablet (300 mg total) by mouth every morning., Disp: 30 tablet, Rfl: 5 .  clonazePAM (KLONOPIN) 1 MG tablet, 1 mg bid prn and occas extra 1/2 po mid-day prn., Disp: 75 tablet, Rfl: 5 .  metoprolol succinate (TOPROL-XL) 25 MG 24 hr tablet, TAKE 1 TABLET(25 MG) BY MOUTH DAILY, Disp: 30 tablet, Rfl: 6 .  zolpidem (AMBIEN) 10 MG tablet, Take 1 tablet (10 mg total) by mouth at bedtime as needed for sleep., Disp: 30 tablet, Rfl: 5 .  cyclobenzaprine (FLEXERIL) 5 MG tablet, Take 1 tablet (5 mg total)  by mouth 2 (two) times daily as needed for muscle spasms. (Patient not taking: Reported on 05/05/2019), Disp: 10 tablet, Rfl: 0 .  lidocaine (LIDODERM) 5 %, Place 1 patch onto the skin daily. Remove & Discard patch within 12 hours or as directed by MD (Patient not taking: Reported on 05/05/2019), Disp: 30 patch, Rfl: 0 .  traZODone (DESYREL) 100 MG tablet, Take 1-2 tablets (100-200 mg total) by mouth at bedtime as needed for sleep., Disp: 60 tablet, Rfl: 5 Medication Side Effects: none  Family Medical/ Social History: Changes? No  MENTAL HEALTH EXAM:  Last menstrual period 04/09/2019.There  is no height or weight on file to calculate BMI.  General Appearance: unable to assess  Eye Contact:  unable to assess  Speech:  Clear and Coherent  Volume:  Normal  Mood:  Euthymic  Affect:  unable to assess  Thought Process:  Goal Directed  Orientation:  Full (Time, Place, and Person)  Thought Content: Logical   Suicidal Thoughts:  No  Homicidal Thoughts:  No  Memory:  WNL  Judgement:  Good  Insight:  Good  Psychomotor Activity:  unable to assess  Concentration:  Concentration: Good  Recall:  Good  Fund of Knowledge: Good  Language: Good  Assets:  Desire for Improvement  ADL's:  Intact  Cognition: WNL  Prognosis:  Good    DIAGNOSES:    ICD-10-CM   1. Insomnia, unspecified type  G47.00   2. Bipolar II disorder, most recent episode major depressive ([place])  F31.81   3. Generalized anxiety disorder  F41.1     Receiving Psychotherapy: No    RECOMMENDATIONS:  She does not need to be on Lunesta and Ambien at the same time. Discontinue Lunesta. Continue Ambien 10 mg nightly as needed. Restart trazodone 100 mg, 1-2 nightly as needed sleep. Continue Wellbutrin XL 300 mg every morning. Continue Klonopin 1 mg twice daily as needed with an occasional extra one half p.o. midday as needed. Discontinue Seroquel. Sleep hygiene discussed.  We do need to get a sleep study as soon as she gets insurance. I discussed good Rx with her.  She goes to [facility] and I sent all her meds and there.  She will check with Marilyn Fowler and may want to transfer all of her medications there.  We can transfer them and we will cancel the prescriptions at [facility] if she chooses to do that.  She will call and let me know after she gets more information. Return in 6 months.  Donnal Moat, PA-C

## 2019-05-26 ENCOUNTER — Encounter (HOSPITAL_BASED_OUTPATIENT_CLINIC_OR_DEPARTMENT_OTHER): Payer: Self-pay | Admitting: Emergency Medicine

## 2019-05-26 ENCOUNTER — Other Ambulatory Visit: Payer: Self-pay

## 2019-05-26 ENCOUNTER — Emergency Department (HOSPITAL_BASED_OUTPATIENT_CLINIC_OR_DEPARTMENT_OTHER)
Admission: EM | Admit: 2019-05-26 | Discharge: 2019-05-26 | Disposition: A | Discharge status: Home / Self Care | Payer: Self-pay | Attending: Emergency Medicine | Admitting: Emergency Medicine

## 2019-05-26 DIAGNOSIS — X509XXA Other and unspecified overexertion or strenuous movements or postures, initial encounter: Secondary | ICD-10-CM | POA: Insufficient documentation

## 2019-05-26 DIAGNOSIS — S39012A Strain of muscle, fascia and tendon of lower back, initial encounter: Secondary | ICD-10-CM | POA: Insufficient documentation

## 2019-05-26 DIAGNOSIS — Y93F2 Activity, caregiving, lifting: Secondary | ICD-10-CM | POA: Insufficient documentation

## 2019-05-26 DIAGNOSIS — Y9289 Other specified places as the place of occurrence of the external cause: Secondary | ICD-10-CM | POA: Insufficient documentation

## 2019-05-26 DIAGNOSIS — M79651 Pain in right thigh: Secondary | ICD-10-CM | POA: Insufficient documentation

## 2019-05-26 DIAGNOSIS — Y998 Other external cause status: Secondary | ICD-10-CM | POA: Insufficient documentation

## 2019-05-26 DIAGNOSIS — R519 Headache, unspecified: Secondary | ICD-10-CM | POA: Insufficient documentation

## 2019-05-26 DIAGNOSIS — R05 Cough: Secondary | ICD-10-CM | POA: Insufficient documentation

## 2019-05-26 DIAGNOSIS — Z79899 Other long term (current) drug therapy: Secondary | ICD-10-CM | POA: Insufficient documentation

## 2019-05-26 DIAGNOSIS — R0981 Nasal congestion: Secondary | ICD-10-CM | POA: Insufficient documentation

## 2019-05-26 DIAGNOSIS — I10 Essential (primary) hypertension: Secondary | ICD-10-CM | POA: Insufficient documentation

## 2019-05-26 DIAGNOSIS — Z87891 Personal history of nicotine dependence: Secondary | ICD-10-CM | POA: Insufficient documentation

## 2019-05-26 DIAGNOSIS — R070 Pain in throat: Secondary | ICD-10-CM | POA: Insufficient documentation

## 2019-05-26 DIAGNOSIS — B349 Viral infection, unspecified: Secondary | ICD-10-CM | POA: Insufficient documentation

## 2019-05-26 MED ORDER — CYCLOBENZAPRINE HCL 10 MG PO TABS: 10.0000 mg | ORAL_TABLET | Freq: Two times a day (BID) | ORAL | 0 refills | Status: DC | PRN

## 2019-05-26 MED FILL — CYCLOBENZAPRINE HCL 10 MG T: 10 | 7 days supply | Qty: 14 | Fill #0

## 2019-05-26 NOTE — ED Provider Notes (Signed)
[facility] EMERGENCY DEPARTMENT Provider Note   CSN: [id number] Arrival date & time: 05/26/19  5329     History Chief Complaint  Patient presents with  . Back Pain  . Nasal Congestion  . Headache  . Sore Throat    Marilyn Fowler is a 44 y.o. female.  Patient presents to the emergency department with primary complaint of right lower back pain with radiation into her right thigh.  Patient unfortunately had to help a family member who fell last night and threw her back out when she lifted them.  She has had pain, worse with movement since that time.  She has a history of hurting her back in the same area.  She has been using ibuprofen, Tylenol, naproxen, heat.  Patient denies warning symptoms of back pain including: fecal incontinence, urinary retention or overflow incontinence, night sweats, waking from sleep with back pain, unexplained fevers or weight loss, h/o cancer, IVDU, recent trauma.    In addition patient complains of 3 days of nasal congestion, headaches, cough, sore throat which started this morning.  No fevers.  No known sick contacts or coronavirus contacts.  Patient is not short of breath and denies any chest pain.  No nausea, vomiting, or diarrhea.         Past Medical History:  Diagnosis Date  . Alcohol abuse   . Anemia   . Anxiety   . Blood transfusion without reported diagnosis   . Depression   . Hemorrhoid   . Hypertension   . Migraine     Patient Active Problem List   Diagnosis Date Noted  . Physical exam 08/07/2017  . Headache 06/30/2017  . Chronic right shoulder pain 09/04/2016  . Hypokalemia 04/30/2016  . Hypomagnesemia 04/30/2016  . Alcoholic hepatitis 04/30/2016  . Alcohol induced fatty liver 04/30/2016  . Alcohol abuse 04/29/2016  . Left medial knee pain 06/29/2015  . Acute bacterial sinusitis 10/11/2014  . Maxillary sinusitis, acute 09/14/2014  . Gastroenteritis 11/09/2013  . Pleuritic chest pain 09/17/2013  . Chest pain  11/18/2012  . Morbid obesity (HCC) 09/29/2012  . HTN (hypertension) 07/12/2012  . Migraine 07/12/2012  . Bipolar II disorder, most recent episode major depressive (HCC) 07/11/2012    Past Surgical History:  Procedure Laterality Date  . HEMORRHOID SURGERY    . HERNIA REPAIR    . TONSILLECTOMY    . VENTRAL HERNIA REPAIR       OB History   No obstetric history on file.     Family History  Problem Relation Age of Onset  . Osteoarthritis Mother   . Diabetes Mother   . Hypertension Mother   . Depression Mother   . Hypertension Father   . Diabetes Father   . Asthma Son   . Deep vein thrombosis Maternal Grandmother   . Deep vein thrombosis Maternal Grandfather   . Stroke Neg Hx     Social History   Tobacco Use  . Smoking status: Former Smoker    Years: 10.00  . Smokeless tobacco: Never Used  . Tobacco comment: smoked 1994-2012, up to 1 pp WEEK  Substance Use Topics  . Alcohol use: No  . Drug use: No    Home Medications Prior to Admission medications   Medication Sig Start Date End Date Taking? Authorizing Provider  amLODipine (NORVASC) 2.5 MG tablet TAKE 1 TABLET(2.5 MG) BY MOUTH DAILY FOR HIGH BLOOD PRESSURE 10/19/18   Sheliah Hatch, MD  buPROPion (WELLBUTRIN XL) 300 MG 24 hr tablet  Take 1 tablet (300 mg total) by mouth every morning. 05/05/19   Melony Overly T, PA-C  clonazePAM (KLONOPIN) 1 MG tablet 1 mg bid prn and occas extra 1/2 po mid-day prn. 05/05/19   Hurst, Rosey Bath T, PA-C  cyclobenzaprine (FLEXERIL) 10 MG tablet Take 1 tablet (10 mg total) by mouth 2 (two) times daily as needed for muscle spasms. 05/26/19   Renne Crigler, PA-C  metoprolol succinate (TOPROL-XL) 25 MG 24 hr tablet TAKE 1 TABLET(25 MG) BY MOUTH DAILY 02/08/19   Sheliah Hatch, MD  traZODone (DESYREL) 100 MG tablet Take 1-2 tablets (100-200 mg total) by mouth at bedtime as needed for sleep. 05/05/19   Melony Overly T, PA-C  zolpidem (AMBIEN) 10 MG tablet Take 1 tablet (10 mg total) by mouth  at bedtime as needed for sleep. 05/05/19 06/04/19  Cherie Ouch, PA-C    Allergies    Methocarbamol and Toradol [ketorolac tromethamine]  Review of Systems   Review of Systems  Constitutional: Negative for chills, fatigue, fever and unexpected weight change.  HENT: Positive for congestion and sore throat. Negative for ear pain, rhinorrhea and sinus pressure.   Eyes: Negative for redness.  Respiratory: Positive for cough. Negative for shortness of breath and wheezing.   Cardiovascular: Negative for chest pain.  Gastrointestinal: Negative for abdominal pain, constipation, diarrhea, nausea and vomiting.       Negative for fecal incontinence.   Genitourinary: Negative for dysuria, flank pain, hematuria, pelvic pain, vaginal bleeding and vaginal discharge.       Negative for urinary incontinence or retention.  Musculoskeletal: Positive for back pain. Negative for myalgias and neck stiffness.  Skin: Negative for rash.  Neurological: Positive for headaches. Negative for weakness and numbness.       Denies saddle paresthesias.  Hematological: Negative for adenopathy.    Physical Exam Updated Vital Signs BP (!) 148/95 (BP Location: Right Arm)   Pulse 97   Temp 98.5 F (36.9 C) (Oral)   Resp (!) 24   SpO2 97%   Physical Exam Vitals and nursing note reviewed.  Constitutional:      Appearance: She is well-developed.  HENT:     Head: Normocephalic and atraumatic.     Jaw: No trismus.     Right Ear: Tympanic membrane, ear canal and external ear normal.     Left Ear: Tympanic membrane, ear canal and external ear normal.     Nose: Nose normal. No mucosal edema or rhinorrhea.     Mouth/Throat:     Mouth: Mucous membranes are not dry. No oral lesions.     Pharynx: Uvula midline. No oropharyngeal exudate, posterior oropharyngeal erythema or uvula swelling.     Tonsils: No tonsillar abscesses.  Eyes:     General:        Right eye: No discharge.        Left eye: No discharge.      Conjunctiva/sclera: Conjunctivae normal.  Cardiovascular:     Rate and Rhythm: Normal rate and regular rhythm.     Heart sounds: Normal heart sounds.  Pulmonary:     Effort: Pulmonary effort is normal. No respiratory distress.     Breath sounds: Normal breath sounds. No wheezing or rales.  Abdominal:     Palpations: Abdomen is soft.     Tenderness: There is no abdominal tenderness.  Musculoskeletal:        General: Normal range of motion.     Cervical back: Normal range of motion and neck supple.  Comments: No step-off noted with palpation of spine.   Lymphadenopathy:     Cervical: No cervical adenopathy.  Skin:    General: Skin is warm and dry.     Findings: No rash.  Neurological:     Mental Status: She is alert.     Sensory: No sensory deficit.     Deep Tendon Reflexes: Reflexes are normal and symmetric.     Comments: 5/5 strength in entire lower extremities bilaterally. No sensation deficit.      ED Results / Procedures / Treatments   Labs (all labs ordered are listed, but only abnormal results are displayed) Labs Reviewed - No data to display  EKG None  Radiology No results found.  Procedures Procedures (including critical care time)  Medications Ordered in ED Medications - No data to display  ED Course  I have reviewed the triage vital signs and the nursing notes.  Pertinent labs & imaging results that were available during my care of the patient were reviewed by me and considered in my medical decision making (see chart for details).  Patient seen and examined.  She likely has a viral syndrome and associated headache.  Offered coronavirus testing and she declines.  Patient is allergic to Toradol and will need to continue to use Tylenol or NSAIDs for her back pain and headache.  Prescription for Flexeril written.  Vital signs reviewed and are as follows: BP (!) 148/95 (BP Location: Right Arm)   Pulse 97   Temp 98.5 F (36.9 C) (Oral)   Resp (!) 24    SpO2 97%   No red flag s/s of low back pain. Patient was counseled on back pain precautions and told to do activity as tolerated but do not lift, push, or pull heavy objects more than 10 pounds for the next week.  Patient counseled to use ice or heat on back for no longer than 15 minutes every hour.   Patient counseled on proper use of muscle relaxant medication.  They were told not to drink alcohol, drive any vehicle, or do any dangerous activities while taking this medication.  Patient verbalized understanding.  Patient urged to follow-up with PCP if pain does not improve with treatment and rest or if pain becomes recurrent. Urged to return with worsening severe pain, loss of bowel or bladder control, trouble walking.   The patient verbalizes understanding and agrees with the plan.      MDM Rules/Calculators/A&P                      Back pain: Patient with back pain after lifting injury, suspect lumbar strain.  Possible mild radicular pain.  No neurological deficits. Patient is ambulatory. No warning symptoms of back pain including: fecal incontinence, urinary retention or overflow incontinence, night sweats, waking from sleep with back pain, unexplained fevers or weight loss, h/o cancer, IVDU, recent trauma. No concern for cauda equina, epidural abscess, or other serious cause of back pain. Conservative measures such as rest, ice/heat and pain medicine indicated with PCP follow-up if no improvement with conservative management.   URI: Patient with symptoms consistent with a viral syndrome.  No concerning coronavirus contacts, declines testing today.  Vitals are stable, no fever. No signs of dehydration. Lung exam normal, no signs of pneumonia. Supportive therapy indicated with return if symptoms worsen.      Final Clinical Impression(s) / ED Diagnoses Final diagnoses:  Strain of lumbar region, initial encounter  Viral syndrome  Rx / DC Orders ED Discharge Orders         Ordered     cyclobenzaprine (FLEXERIL) 10 MG tablet  2 times daily PRN     05/26/19 1025           Renne Crigler, PA-C 05/26/19 1031    Terrilee Files, MD 05/26/19 [phone]

## 2019-05-26 NOTE — ED Triage Notes (Signed)
Pt here after picking up mother who fell last night and hurt her back. Pt also c/o headache, nasal congestion and sore throat x 3 days.

## 2019-05-26 NOTE — Discharge Instructions (Signed)
Please read and follow all provided instructions.  Your diagnoses today include:  1. Strain of lumbar region, initial encounter   2. Viral syndrome     Tests performed today include:  Vital signs - see below for your results today  Medications prescribed:   Flexeril (cyclobenzaprine) - muscle relaxer medication  DO NOT drive or perform any activities that require you to be awake and alert because this medicine can make you drowsy.   Take any prescribed medications only as directed.  Home care instructions:   Follow any educational materials contained in this packet  Please rest, use ice or heat on your back for the next several days  Do not lift, push, pull anything more than 10 pounds for the next week  Follow-up instructions: Please follow-up with your primary care provider in the next 1 week for further evaluation of your symptoms.   Return instructions:  SEEK IMMEDIATE MEDICAL ATTENTION IF YOU HAVE:  New numbness, tingling, weakness, or problem with the use of your arms or legs  Severe back pain not relieved with medications  Loss control of your bowels or bladder  Increasing pain in any areas of the body (such as chest or abdominal pain)  Shortness of breath, dizziness, or fainting.   Worsening nausea (feeling sick to your stomach), vomiting, fever, or sweats  Any other emergent concerns regarding your health   Additional Information:  Your vital signs today were: BP (!) 148/95 (BP Location: Right Arm)   Pulse 97   Temp 98.5 F (36.9 C) (Oral)   Resp (!) 24   SpO2 97%  If your blood pressure (BP) was elevated above 135/85 this visit, please have this repeated by your doctor within one month. --------------

## 2019-06-21 IMAGING — MR MR HEAD W/O CM
9 of 10 series · 37 of 48 positions shown · non-contrast
Comparison: CT HEAD June 30, 2017

CLINICAL DATA: Headache for 2 weeks, pain atypical for patient's
normal migraines.

EXAM:
MRI HEAD WITHOUT CONTRAST
TECHNIQUE: Multiplanar, multiecho pulse sequences of the brain and surrounding
structures were obtained without intravenous contrast.

[Series 3: DWI · axial · 3.0mm · 0.94mm/px · z∈[-61,+83]mm · 9 of 98 slices shown (1 of 2)]
[im 1/98]
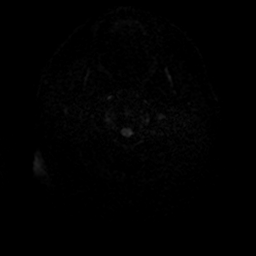
[im 13/98]
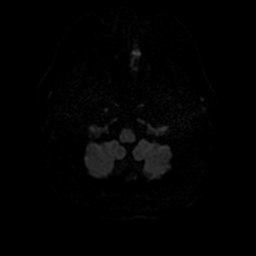
[im 25/98]
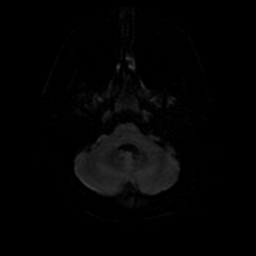
[im 37/98]
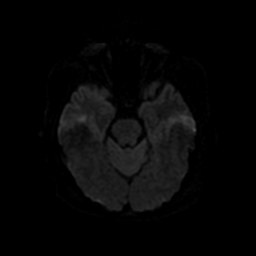
[im 49/98]
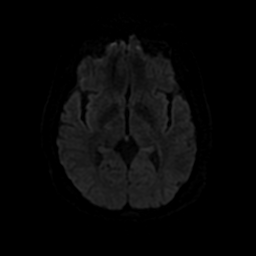
[im 61/98]
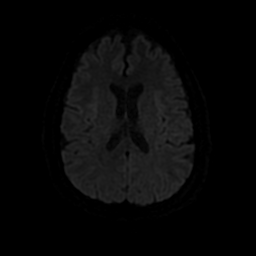
[im 73/98]
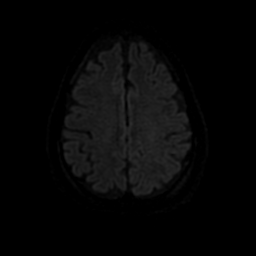
[im 85/98]
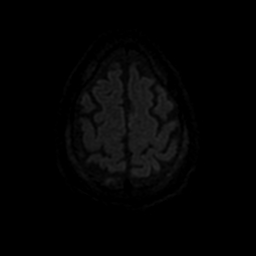
[im 98/98]
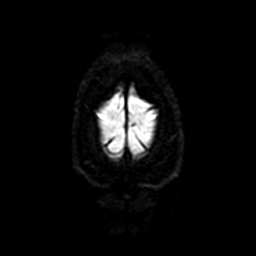

[Series 4: FLAIR · axial · 3.0mm · 0.94mm/px · z∈[-61,+83]mm · 2 of 25 slices shown (1 of 2)]
[im 1/25]
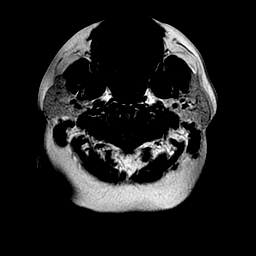
[im 25/25]
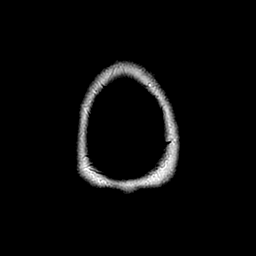

[Series 5: (person_name) · axial · 3.0mm · 0.47mm/px · z∈[-63,+3]mm · 4 of 100 slices shown]
[im 1/100]
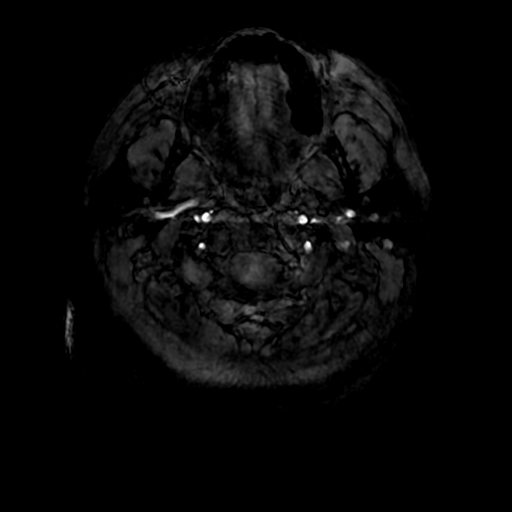
[im 12/100]
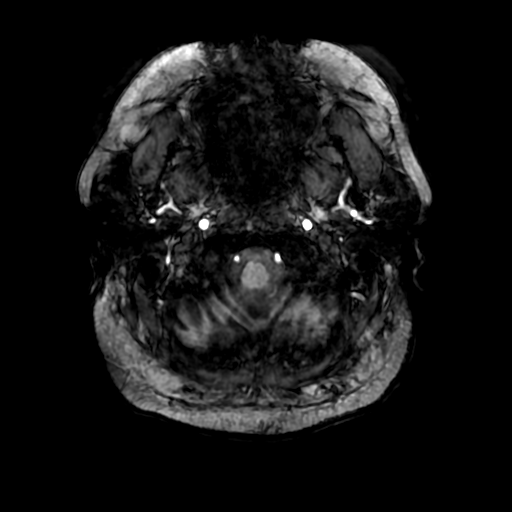
[im 34/100]
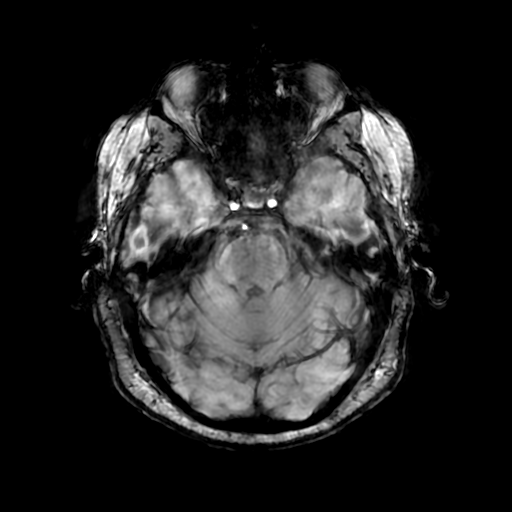
[im 45/100]
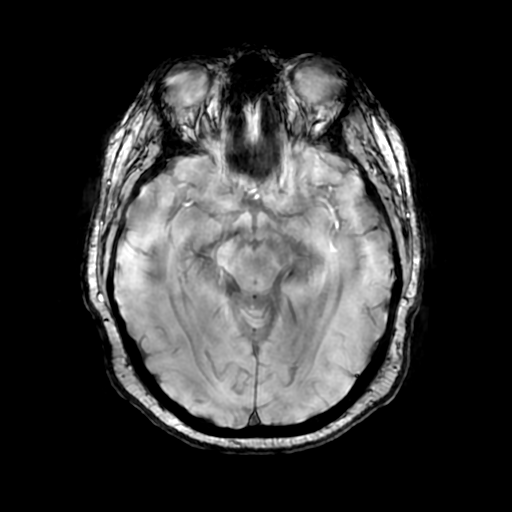

[Series 6: T2 · axial · 5.0mm · 0.47mm/px · z∈[-61,+83]mm · 2 of 25 slices shown (1 of 2)]
[im 1/25]
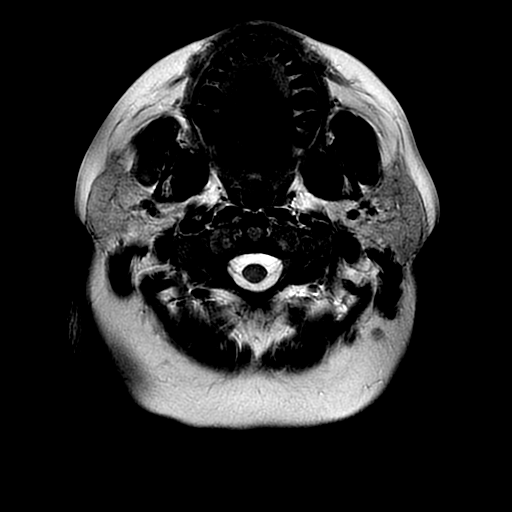
[im 25/25]
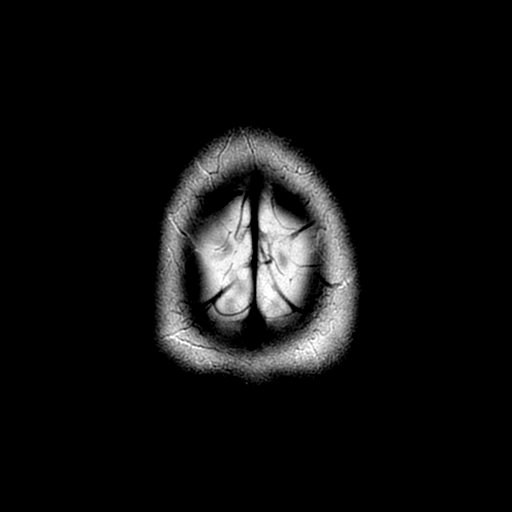

[Series 7: DWI · coronal · 4.0mm · 0.94mm/px · 7 of 70 slices shown (2 of 2)]
[im 1/70]
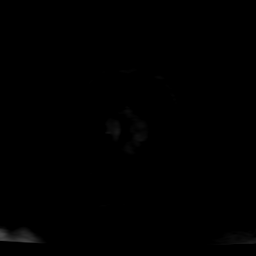
[im 12/70]
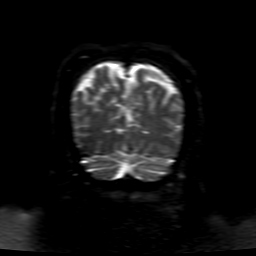
[im 24/70]
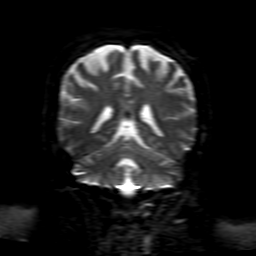
[im 35/70]
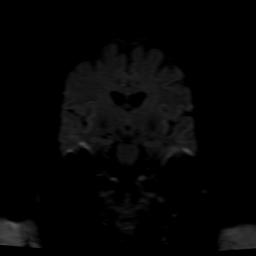
[im 47/70]
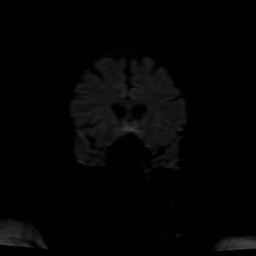
[im 58/70]
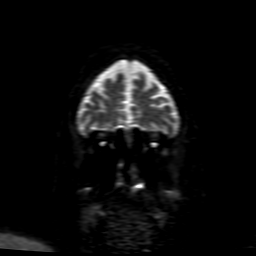
[im 70/70]
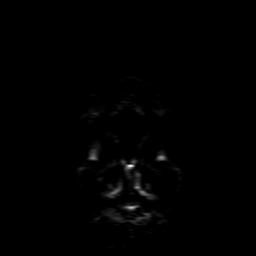

[Series 8: FLAIR · sagittal · 5.0mm · 0.47mm/px · 2 of 23 slices shown (2 of 2)]
[im 1/23]
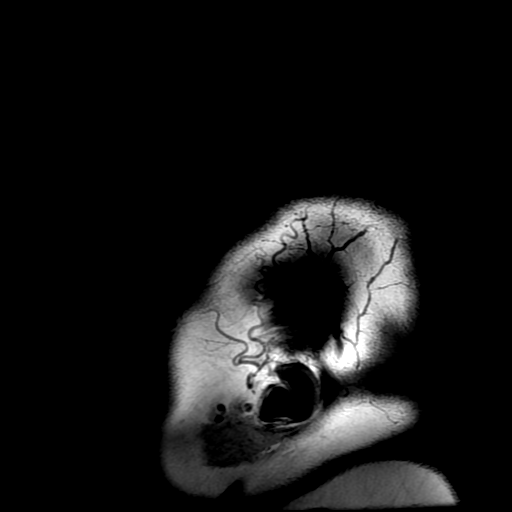
[im 23/23]
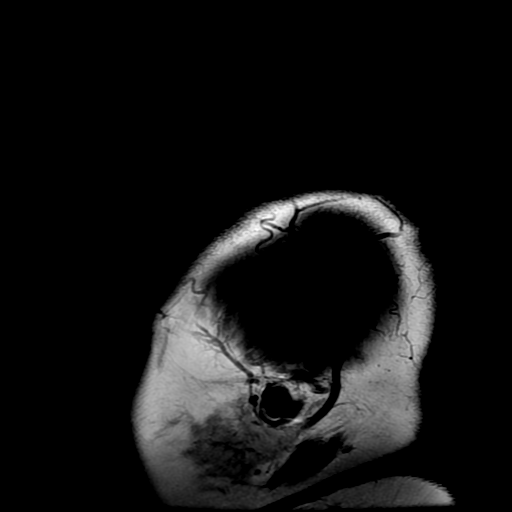

[Series 10: T2 · coronal · 5.0mm · 0.39mm/px · 3 of 29 slices shown (2 of 2)]
[im 1/29]
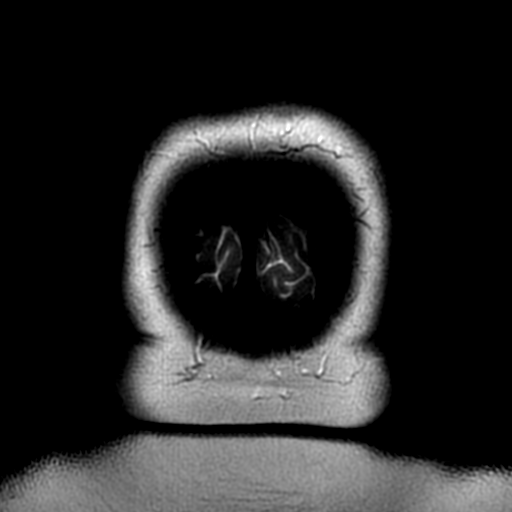
[im 15/29]
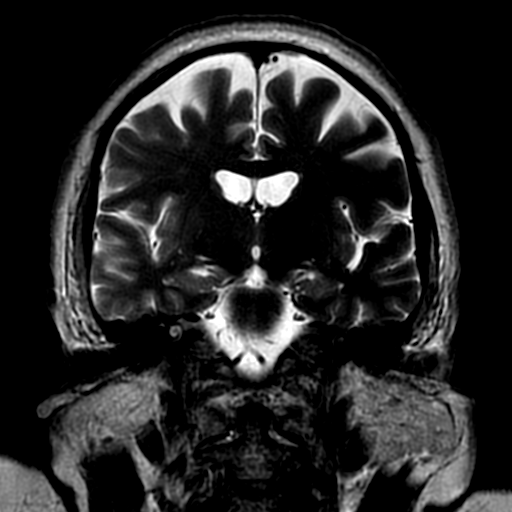
[im 29/29]
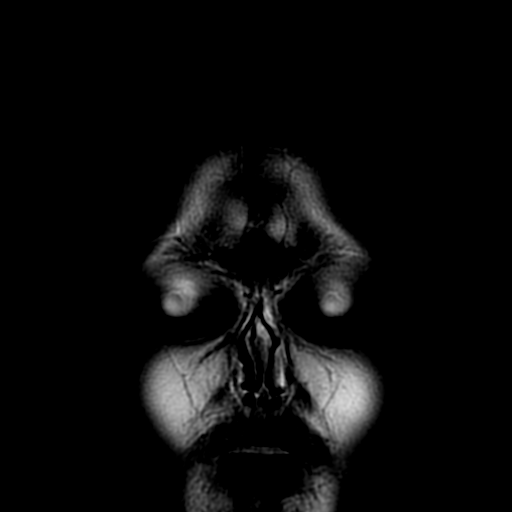

[Series 350: ADC · axial · 3.0mm · 0.94mm/px · z∈[-61,+83]mm · 5 of 49 slices shown (1 of 2)]
[im 1/49]
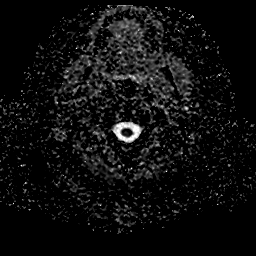
[im 13/49]
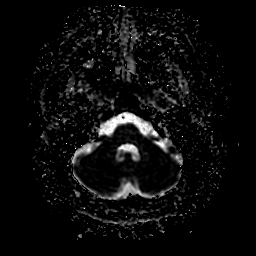
[im 25/49]
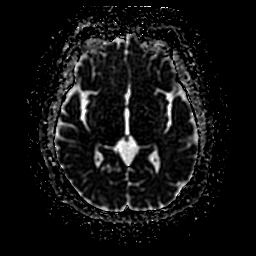
[im 37/49]
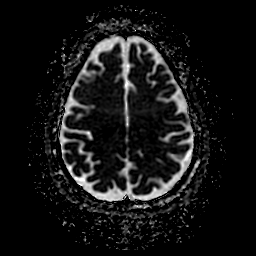
[im 49/49]
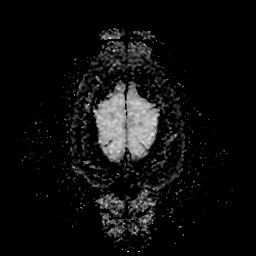

[Series 750: ADC · coronal · 4.0mm · 0.94mm/px · 3 of 35 slices shown (2 of 2)]
[im 1/35]
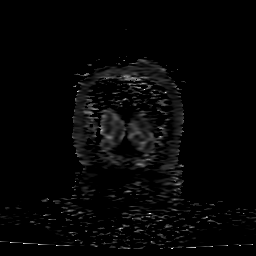
[im 18/35]
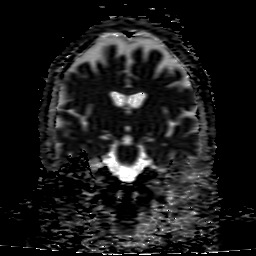
[im 35/35]
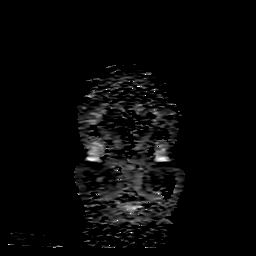

[37 of 48 positions shown; findings below may reference images not displayed]

FINDINGS: INTRACRANIAL CONTENTS: No reduced diffusion to suggest acute
ischemia or hyperacute demyelination. No susceptibility artifact to
suggest hemorrhage. Borderline parenchymal brain volume loss. No
hydrocephalus. A few punctate supratentorial white matter FLAIR T2
hyperintensities, nonspecific and, normal for age. No suspicious
parenchymal signal, masses, mass effect. No abnormal extra-axial
fluid collections. No extra-axial masses.

VASCULAR: Normal major intracranial vascular flow voids present at
skull base.

SKULL AND UPPER CERVICAL SPINE: Partially empty sella. No suspicious
calvarial bone marrow signal. Craniocervical junction maintained.

SINUSES/ORBITS: The mastoid air-cells and included paranasal sinuses
are well-aerated.The included ocular globes and orbital contents are
non-suspicious.

OTHER: None.
IMPRESSION: 1. No acute intracranial process.
2. Borderline parenchymal brain volume loss for age.
3. Partially empty sella.

## 2019-06-22 ENCOUNTER — Other Ambulatory Visit: Payer: Self-pay | Admitting: Physician Assistant

## 2019-08-07 ENCOUNTER — Encounter (HOSPITAL_BASED_OUTPATIENT_CLINIC_OR_DEPARTMENT_OTHER): Payer: Self-pay | Admitting: Emergency Medicine

## 2019-08-07 ENCOUNTER — Emergency Department (HOSPITAL_BASED_OUTPATIENT_CLINIC_OR_DEPARTMENT_OTHER)
Admission: EM | Admit: 2019-08-07 | Discharge: 2019-08-07 | Disposition: A | Discharge status: Home / Self Care | Payer: Self-pay | Attending: Emergency Medicine | Admitting: Emergency Medicine

## 2019-08-07 ENCOUNTER — Other Ambulatory Visit: Payer: Self-pay

## 2019-08-07 DIAGNOSIS — Z87891 Personal history of nicotine dependence: Secondary | ICD-10-CM | POA: Insufficient documentation

## 2019-08-07 DIAGNOSIS — I10 Essential (primary) hypertension: Secondary | ICD-10-CM | POA: Insufficient documentation

## 2019-08-07 DIAGNOSIS — Z79899 Other long term (current) drug therapy: Secondary | ICD-10-CM | POA: Insufficient documentation

## 2019-08-07 DIAGNOSIS — R519 Headache, unspecified: Secondary | ICD-10-CM | POA: Insufficient documentation

## 2019-08-07 DIAGNOSIS — R112 Nausea with vomiting, unspecified: Secondary | ICD-10-CM | POA: Insufficient documentation

## 2019-08-07 DIAGNOSIS — R42 Dizziness and giddiness: Secondary | ICD-10-CM | POA: Insufficient documentation

## 2019-08-07 MED ORDER — METOCLOPRAMIDE HCL 5 MG/ML IJ SOLN
10.0000 mg | Freq: Once | INTRAMUSCULAR | Status: AC
  Administered 2019-08-07: 10 mg via INTRAVENOUS
  Filled 2019-08-07: qty 2

## 2019-08-07 MED ORDER — AMLODIPINE BESYLATE 5 MG PO TABS
5.0000 mg | ORAL_TABLET | Freq: Once | ORAL | Status: AC
  Administered 2019-08-07: 5 mg via ORAL
  Filled 2019-08-07: qty 1

## 2019-08-07 MED ORDER — LORAZEPAM 2 MG/ML IJ SOLN
1.0000 mg | Freq: Once | INTRAMUSCULAR | Status: AC
  Administered 2019-08-07: 1 mg via INTRAVENOUS
  Filled 2019-08-07: qty 1

## 2019-08-07 MED ORDER — CLONAZEPAM 1 MG PO TABS
1.0000 mg | ORAL_TABLET | Freq: Once | ORAL | Status: DC
  Filled 2019-08-07: qty 1

## 2019-08-07 MED ORDER — DIPHENHYDRAMINE HCL 50 MG/ML IJ SOLN
25.0000 mg | Freq: Once | INTRAMUSCULAR | Status: AC
  Administered 2019-08-07: 14:00:00 25 mg via INTRAVENOUS
  Filled 2019-08-07: qty 1

## 2019-08-07 MED ORDER — PROCHLORPERAZINE EDISYLATE 10 MG/2ML IJ SOLN
10.0000 mg | Freq: Once | INTRAMUSCULAR | Status: AC
  Administered 2019-08-07: 10 mg via INTRAVENOUS
  Filled 2019-08-07: qty 2

## 2019-08-07 MED ORDER — CHLORDIAZEPOXIDE HCL 25 MG PO CAPS: ORAL_CAPSULE | ORAL | 0 refills | Status: DC

## 2019-08-07 MED ORDER — SODIUM CHLORIDE 0.9 % IV BOLUS
1000.0000 mL | Freq: Once | INTRAVENOUS | Status: AC
  Administered 2019-08-07: 1000 mL via INTRAVENOUS

## 2019-08-07 MED ORDER — CHLORDIAZEPOXIDE HCL 25 MG PO CAPS
100.0000 mg | ORAL_CAPSULE | Freq: Once | ORAL | Status: AC
  Administered 2019-08-07: 100 mg via ORAL
  Filled 2019-08-07: qty 4

## 2019-08-07 MED ORDER — DIPHENHYDRAMINE HCL 50 MG/ML IJ SOLN
25.0000 mg | Freq: Once | INTRAMUSCULAR | Status: AC
  Administered 2019-08-07: 25 mg via INTRAVENOUS
  Filled 2019-08-07: qty 1

## 2019-08-07 NOTE — Discharge Instructions (Signed)
Follow-up with your family doctor.  Please return for worsening headache fever one-sided numbness or weakness difficulty with speech or swallowing.

## 2019-08-07 NOTE — ED Provider Notes (Signed)
[facility] EMERGENCY DEPARTMENT Provider Note   CSN: [id number] Arrival date & time: 08/07/19  1150     History Chief Complaint  Patient presents with  . Headache    Marilyn Fowler is a 44 y.o. female.  44 yo F with a cc of a headache.  The patient states that she went out drinking and the next morning had profound vomiting.  Has had persistent vomiting and stomach upset since.  Started having a headache that she felt was consistent of her prior migraines.  Described as throbbing worse with bright lights.  Having persistent nausea.  Is also feeling a bit lightheaded when she stands up.  She otherwise denies neurologic symptoms denies vision change denies blurry vision denies double vision denies one-sided numbness or weakness denies difficulty with speech or swallowing.  Denies head trauma.  The history is provided by the patient.  Headache Pain location:  Generalized Quality:  Dull Radiates to:  Does not radiate Severity currently:  6/10 Severity at highest:  6/10 Onset quality:  Gradual Duration:  2 days Timing:  Constant Progression:  Worsening Chronicity:  New Similar to prior headaches: yes   Context: bright light and loud noise   Context: not activity   Relieved by:  Nothing Worsened by:  Nothing Ineffective treatments:  None tried Associated symptoms: nausea and vomiting   Associated symptoms: no congestion, no dizziness, no fever and no myalgias        Past Medical History:  Diagnosis Date  . Alcohol abuse   . Anemia   . Anxiety   . Blood transfusion without reported diagnosis   . Depression   . Hemorrhoid   . Hypertension   . Migraine     Patient Active Problem List   Diagnosis Date Noted  . Physical exam 08/07/2017  . Headache 06/30/2017  . Chronic right shoulder pain 09/04/2016  . Hypokalemia 04/30/2016  . Hypomagnesemia 04/30/2016  . Alcoholic hepatitis 94/49/6759  . Alcohol induced fatty liver 04/30/2016  . Alcohol abuse  04/29/2016  . Left medial knee pain 06/29/2015  . Acute bacterial sinusitis 10/11/2014  . Maxillary sinusitis, acute 09/14/2014  . Gastroenteritis 11/09/2013  . Pleuritic chest pain 09/17/2013  . Chest pain 11/18/2012  . Morbid obesity ([place]) 09/29/2012  . HTN (hypertension) 07/12/2012  . Migraine 07/12/2012  . Bipolar II disorder, most recent episode major depressive ([place]) 07/11/2012    Past Surgical History:  Procedure Laterality Date  . HEMORRHOID SURGERY    . HERNIA REPAIR    . TONSILLECTOMY    . VENTRAL HERNIA REPAIR       OB History   No obstetric history on file.     Family History  Problem Relation Age of Onset  . Osteoarthritis Mother   . Diabetes Mother   . Hypertension Mother   . Depression Mother   . Hypertension Father   . Diabetes Father   . Asthma Son   . Deep vein thrombosis Maternal Grandmother   . Deep vein thrombosis Maternal Grandfather   . Stroke Neg Hx     Social History   Tobacco Use  . Smoking status: Former Smoker    Years: 10.00  . Smokeless tobacco: Never Used  . Tobacco comment: smoked 1994-2012, up to 1 pp WEEK  Substance Use Topics  . Alcohol use: No  . Drug use: No    Home Medications Prior to Admission medications   Medication Sig Start Date End Date Taking? Authorizing Provider  clonazePAM Bobbye [place]) 1  MG tablet clonazepam 1 mg tablet 10/06/18  Yes [provider]  amLODipine (NORVASC) 2.5 MG tablet TAKE 1 TABLET(2.5 MG) BY MOUTH DAILY FOR HIGH BLOOD PRESSURE 10/19/18   Sheliah Hatch, MD  buPROPion (WELLBUTRIN XL) 300 MG 24 hr tablet Take 1 tablet (300 mg total) by mouth every morning. 05/05/19   Melony Overly T, PA-C  chlordiazePOXIDE (LIBRIUM) 25 MG capsule 50mg  PO TID x 1D, then 25-50mg  PO BID X 1D, then 25-50mg  PO QD X 1D 08/07/19   10/07/19, DO  clonazePAM (KLONOPIN) 1 MG tablet 1 mg bid prn and occas extra 1/2 po mid-day prn. 05/05/19   [place], 07/03/19 T, PA-C  cyclobenzaprine (FLEXERIL) 10 MG tablet Take 1  tablet (10 mg total) by mouth 2 (two) times daily as needed for muscle spasms. 05/26/19   05/28/19, PA-C  metoprolol succinate (TOPROL-XL) 25 MG 24 hr tablet TAKE 1 TABLET(25 MG) BY MOUTH DAILY 02/08/19   04/10/19, MD  traZODone (DESYREL) 100 MG tablet Take 1-2 tablets (100-200 mg total) by mouth at bedtime as needed for sleep. 05/05/19   07/03/19 T, PA-C  zolpidem (AMBIEN) 10 MG tablet Take 1 tablet (10 mg total) by mouth at bedtime as needed for sleep. 05/05/19 06/04/19  08/02/19, PA-C    Allergies    Methocarbamol and Toradol [ketorolac tromethamine]  Review of Systems   Review of Systems  Constitutional: Negative for chills and fever.  HENT: Negative for congestion and rhinorrhea.   Eyes: Negative for redness and visual disturbance.  Respiratory: Negative for shortness of breath and wheezing.   Cardiovascular: Negative for chest pain and palpitations.  Gastrointestinal: Positive for nausea and vomiting.  Genitourinary: Negative for dysuria and urgency.  Musculoskeletal: Negative for arthralgias and myalgias.  Skin: Negative for pallor and wound.  Neurological: Positive for light-headedness and headaches. Negative for dizziness.    Physical Exam Updated Vital Signs BP (!) 184/115 (BP Location: Right Arm)   Pulse 96   Temp 98.6 F (37 C) (Oral)   Resp 20   Ht 5' (1.524 m)   Wt 127 kg   LMP 07/13/2019   SpO2 98%   BMI 54.68 kg/m   Physical Exam Vitals and nursing note reviewed.  Constitutional:      General: She is not in acute distress.    Appearance: She is well-developed. She is not diaphoretic.  HENT:     Head: Normocephalic and atraumatic.  Eyes:     Pupils: Pupils are equal, round, and reactive to light.  Cardiovascular:     Rate and Rhythm: Normal rate and regular rhythm.     Heart sounds: No murmur. No friction rub. No gallop.   Pulmonary:     Effort: Pulmonary effort is normal.     Breath sounds: No wheezing or rales.  Abdominal:      General: There is no distension.     Palpations: Abdomen is soft.     Tenderness: There is no abdominal tenderness.  Musculoskeletal:        General: No tenderness.     Cervical back: Normal range of motion and neck supple.  Skin:    General: Skin is warm and dry.  Neurological:     Mental Status: She is alert and oriented to person, place, and time.     Cranial Nerves: Cranial nerves are intact.     Sensory: Sensation is intact.     Motor: Motor function is intact.  Coordination: Coordination is intact.     Gait: Gait is intact.     Comments: Benign neuro exam  Psychiatric:        Behavior: Behavior normal.     ED Results / Procedures / Treatments   Labs (all labs ordered are listed, but only abnormal results are displayed) Labs Reviewed - No data to display  EKG None  Radiology No results found.  Procedures Procedures (including critical care time)  Medications Ordered in ED Medications  sodium chloride 0.9 % bolus 1,000 mL (0 mLs Intravenous Stopped 08/07/19 1415)  prochlorperazine (COMPAZINE) injection 10 mg (10 mg Intravenous Given 08/07/19 1243)  diphenhydrAMINE (BENADRYL) injection 25 mg (25 mg Intravenous Given 08/07/19 1243)  sodium chloride 0.9 % bolus 1,000 mL (1,000 mLs Intravenous New Bag/Given 08/07/19 1430)  metoCLOPramide (REGLAN) injection 10 mg (10 mg Intravenous Given by Other 08/07/19 1358)  diphenhydrAMINE (BENADRYL) injection 25 mg (25 mg Intravenous Given by Other 08/07/19 1358)  amLODipine (NORVASC) tablet 5 mg (5 mg Oral Given by Other 08/07/19 1358)  LORazepam (ATIVAN) injection 1 mg (1 mg Intravenous Given 08/07/19 1430)  chlordiazePOXIDE (LIBRIUM) capsule 100 mg (100 mg Oral Given 08/07/19 1430)    ED Course  I have reviewed the triage vital signs and the nursing notes.  Pertinent labs & imaging results that were available during my care of the patient were reviewed by me and considered in my medical decision making (see chart for  details).    MDM Rules/Calculators/A&P                      44 yo F with a cc of a headache.  Started after an alcohol binge about 3 days ago.  Feels typical of her migraines.  Denies any neurologic deficit.  Denies change in vision.  Will give a headache cocktail in the bag of IV fluids and reassess.  Patient looks like she feels better but says she is had no improvement.  She is well-appearing and nontoxic.  Will give her a dose of Reglan and Benadryl.  She is convinced that her pain will improve until her blood pressure comes down.  She is asking for a dose of her amlodipine.  Also give more fluids with her reported history of vomiting.  Patient feeling much better on reassessment.  Patient does have a history of alcohol abuse and there was some concern with withdrawal of one of her previous admissions to the hospital.  This could explain her tachycardia and hypertension and why it improved with Librium and Ativan.  Will prescribe her a tapered dose of Librium.  Have her follow-up with her family doctor.  3:09 PM:  I have discussed the diagnosis/risks/treatment options with the patient and believe the pt to be eligible for discharge home to follow-up with PCP. We also discussed returning to the ED immediately if new or worsening sx occur. We discussed the sx which are most concerning (e.g., sudden worsening pain, fever, inability to tolerate by mouth) that necessitate immediate return. Medications administered to the patient during their visit and any new prescriptions provided to the patient are listed below.  Medications given during this visit Medications  sodium chloride 0.9 % bolus 1,000 mL (0 mLs Intravenous Stopped 08/07/19 1415)  prochlorperazine (COMPAZINE) injection 10 mg (10 mg Intravenous Given 08/07/19 1243)  diphenhydrAMINE (BENADRYL) injection 25 mg (25 mg Intravenous Given 08/07/19 1243)  sodium chloride 0.9 % bolus 1,000 mL (1,000 mLs Intravenous New Bag/Given 08/07/19 1430)  metoCLOPramide (REGLAN) injection 10 mg (10 mg Intravenous Given by Other 08/07/19 1358)  diphenhydrAMINE (BENADRYL) injection 25 mg (25 mg Intravenous Given by Other 08/07/19 1358)  amLODipine (NORVASC) tablet 5 mg (5 mg Oral Given by Other 08/07/19 1358)  LORazepam (ATIVAN) injection 1 mg (1 mg Intravenous Given 08/07/19 1430)  chlordiazePOXIDE (LIBRIUM) capsule 100 mg (100 mg Oral Given 08/07/19 1430)     The patient appears reasonably screen and/or stabilized for discharge and I doubt any other medical condition or other [facility] requiring further screening, evaluation, or treatment in the ED at this time prior to discharge.   Final Clinical Impression(s) / ED Diagnoses Final diagnoses:  Bad headache    Rx / DC Orders ED Discharge Orders         Ordered    chlordiazePOXIDE (LIBRIUM) 25 MG capsule     08/07/19 1506    Ambulatory referral to Neurology    Comments: Headache syndrome   08/07/19 1507           Melene Plan, DO 08/07/19 1509

## 2019-08-07 NOTE — ED Triage Notes (Signed)
Headache and dizziness today with hx of migraines. States she feels like her BP is elevated.

## 2019-10-08 ENCOUNTER — Other Ambulatory Visit: Payer: Self-pay | Admitting: Physician Assistant

## 2019-10-08 NOTE — Telephone Encounter (Signed)
Last apt 04/2019 

## 2019-10-18 ENCOUNTER — Telehealth: Payer: Self-pay | Admitting: Physician Assistant

## 2019-10-18 DIAGNOSIS — F332 Major depressive disorder, recurrent severe without psychotic features: Secondary | ICD-10-CM | POA: Insufficient documentation

## 2019-10-18 NOTE — Telephone Encounter (Signed)
Marilyn Fowler, her mother, called to say that the family had to admit her last night to [facility] ER for a suicide attempt. She is tranferred from there now but not sure where she has been taken. Need to talk to you about her care.

## 2019-10-18 NOTE — Telephone Encounter (Signed)
I spoke with her Mom , Germaine Pomfret, who told me that pt took a "handful of pills" last night.  She is inpatient at [facility] and physically she is okay.  Germaine Pomfret wanted me to know that Marilyn Fowler has been abusing her pills.  She has taken as many as around 65 pills in 8 days or so in the past few months.  I have been prescribing 75 pills to last a month.  She is also on oxycodone which she steals from her mom who takes oxycodone for chronic pain.  She has to hide her pills to keep Marilyn Fowler from getting them.  Another problem is Marilyn Fowler and her 36 year old son are like oil and water and do not get along at all.  He says really mean things to her such as he wishes she would commit suicide.  Marilyn Fowler is not in counseling.  She has an appointment with me June 30. If she has not been weaned off of the Klonopin while inpatient, I will wean her off and not give any more benzos.  And if she has not been referred to a counselor upon discharge, I will refer her to 1.

## 2019-10-27 ENCOUNTER — Telehealth (INDEPENDENT_AMBULATORY_CARE_PROVIDER_SITE_OTHER): Payer: Self-pay | Admitting: Physician Assistant

## 2019-10-27 ENCOUNTER — Encounter: Payer: Self-pay | Admitting: Physician Assistant

## 2019-10-27 DIAGNOSIS — T424X2A Poisoning by benzodiazepines, intentional self-harm, initial encounter: Secondary | ICD-10-CM

## 2019-10-27 DIAGNOSIS — F411 Generalized anxiety disorder: Secondary | ICD-10-CM

## 2019-10-27 DIAGNOSIS — G4700 Insomnia, unspecified: Secondary | ICD-10-CM

## 2019-10-27 DIAGNOSIS — F3181 Bipolar II disorder: Secondary | ICD-10-CM

## 2019-10-27 MED ORDER — QUETIAPINE FUMARATE ER 150 MG PO TB24: 150.0000 mg | ORAL_TABLET | Freq: Every day | ORAL | 1 refills | Status: DC

## 2019-10-27 MED ORDER — CLONAZEPAM 0.5 MG PO TABS: 0.2500 mg | ORAL_TABLET | Freq: Two times a day (BID) | ORAL | 0 refills | Status: DC | PRN

## 2019-10-27 MED ORDER — BUSPIRONE HCL 15 MG PO TABS: ORAL_TABLET | ORAL | 1 refills | Status: DC

## 2019-10-27 NOTE — Progress Notes (Signed)
[facility] Med Check  Patient ID: Marilyn Fowler,  MRN: [id number]  PCP: Sheliah Hatch, MD  Date of Evaluation: 10/27/2019 Time spent:30 minutes  Chief Complaint:  Chief Complaint    Medication Refill     Virtual Visit via Telephone or Video Note  I connected with patient by a video enabled telemedicine application or telephone, with their informed consent, and verified patient privacy and that I am speaking with the correct person using two identifiers.  I am private, in my office and the patient is home.  I discussed the limitations, risks, security and privacy concerns of performing an evaluation and management service by telephone and the availability of in person appointments. I also discussed with the patient that there may be a patient responsible charge related to this service. The patient expressed understanding and agreed to proceed.   I discussed the assessment and treatment plan with the patient. The patient was provided an opportunity to ask questions and all were answered. The patient agreed with the plan and demonstrated an understanding of the instructions.   The patient was advised to call back or seek an in-person evaluation if the symptoms worsen or if the condition fails to improve as anticipated.  I provided 30  minutes of non-face-to-face time during this encounter.  HISTORY/CURRENT STATUS: HPI for follow-up of hospitalization.  Marilyn Fowler was admitted to [facility] behavioral health on 10/17/2019 and discharged on 10/22/2019.  She presented to the emergency room for an intentional overdose, reportedly taking 15 Klonopin.  During that admission, she went through counseling which she states was helpful to her.    On discharge, she was given Klonopin 0.5 mg, 1 p.o. twice daily for 1 week only.  She tells me now that she has already taken them all because she is so anxious.  States she needs more and a higher dose.  Also complains of not sleeping well.   She is on trazodone 200 mg which is not beneficial.  She would like to have the Ambien back.  Also on discharge she was given Abilify 5 mg, which she has not picked up due to cost, and Prozac.  She does not like the way Prozac makes her feel.  But does not offer specifics.  In the past she took Seroquel for the bipolar disorder and it helped a lot.  It helped with mood as well as sleep.  She does not want to go on it because it made her gain weight.  She does not want me to put her on anything that makes her gain weight.  Since being out of the hospital, her mood is stable.  She denies any suicidal thoughts at this time.  She is able to enjoy things, and would like to get back into counseling.  She has asked for recommendations for counselors in [facility].  Motivation and energy are good, she does not cry easily.  No homicidal thoughts.  Of importance is that she was on Librium approximately a month ago.  I found that only through her medication list and when asked her about it she was evasive but finally said it was to get off alcohol.  I am unable to see any hospitalization records from any healthcare system.  Patient denies increased energy with decreased need for sleep, no increased talkativeness, no racing thoughts, no impulsivity or risky behaviors, no increased spending, no increased libido, no grandiosity, no increased irritability or anger, and no hallucinations.  No paranoia.  Review of Systems  Constitutional: Negative.   HENT: Negative.   Eyes: Negative.   Respiratory: Negative.   Cardiovascular: Negative.   Gastrointestinal: Negative.   Genitourinary: Negative.   Musculoskeletal: Negative.   Skin: Negative.   Neurological: Negative.   Endo/Heme/Allergies: Negative.   Psychiatric/Behavioral: The patient is nervous/anxious and has insomnia.    Individual Medical History/ Review of Systems: Changes? :No   Allergies: Methocarbamol and Toradol [ketorolac tromethamine]  Current  Medications:  Current Outpatient Medications:  .  amLODipine (NORVASC) 2.5 MG tablet, TAKE 1 TABLET(2.5 MG) BY MOUTH DAILY FOR HIGH BLOOD PRESSURE, Disp: 30 tablet, Rfl: 6 .  FLUoxetine (PROZAC) 20 MG capsule, Take by mouth., Disp: , Rfl:  .  metoprolol succinate (TOPROL-XL) 25 MG 24 hr tablet, TAKE 1 TABLET(25 MG) BY MOUTH DAILY, Disp: 30 tablet, Rfl: 6 .  traZODone (DESYREL) 100 MG tablet, TAKE 1 TO 2 TABLETS(100 TO 200 MG) BY MOUTH AT BEDTIME AS NEEDED FOR SLEEP, Disp: 60 tablet, Rfl: 5 .  busPIRone (BUSPAR) 15 MG tablet, 1/3 po bid for 7 days, then 2/3 po bid for 7 days, then 1 po bid., Disp: 60 tablet, Rfl: 1 .  chlordiazePOXIDE (LIBRIUM) 25 MG capsule, 50mg  PO TID x 1D, then 25-50mg  PO BID X 1D, then 25-50mg  PO QD X 1D (Patient not taking: Reported on 10/27/2019), Disp: 10 capsule, Rfl: 0 .  clonazePAM (KLONOPIN) 0.5 MG tablet, Take 0.5 tablets (0.25 mg total) by mouth 2 (two) times daily as needed for anxiety., Disp: 10 tablet, Rfl: 0 .  cyclobenzaprine (FLEXERIL) 10 MG tablet, Take 1 tablet (10 mg total) by mouth 2 (two) times daily as needed for muscle spasms. (Patient not taking: Reported on 10/27/2019), Disp: 14 tablet, Rfl: 0 .  QUEtiapine Fumarate (SEROQUEL XR) 150 MG 24 hr tablet, Take 1 tablet (150 mg total) by mouth at bedtime., Disp: 30 tablet, Rfl: 1 .  zolpidem (AMBIEN) 10 MG tablet, Take 1 tablet (10 mg total) by mouth at bedtime as needed for sleep., Disp: 30 tablet, Rfl: 5 Medication Side Effects: none  Family Medical/ Social History: Changes? No  MENTAL HEALTH EXAM:  There were no vitals taken for this visit.There is no height or weight on file to calculate BMI.  General Appearance: Unable to assess  Eye Contact:  Unable to assess  Speech:  Clear and Coherent and Normal Rate  Volume:  Normal  Mood:  Anxious  Affect:  Anxious  Thought Process:  Goal Directed and Descriptions of Associations: Intact  Orientation:  Full (Time, Place, and Person)  Thought Content: Logical    Suicidal Thoughts:  No  Homicidal Thoughts:  No  Memory:  WNL  Judgement:  Good  Insight:  Good  Psychomotor Activity:  Unable to assess  Concentration:  Concentration: Good  Recall:  Good  Fund of Knowledge: Good  Language: Good  Assets:  Desire for Improvement  ADL's:  Intact  Cognition: WNL  Prognosis:  Good    DIAGNOSES:    ICD-10-CM   1. Suicide attempt by benzodiazepine overdose (HCC)  T42.4X2A   2. Bipolar II disorder, most recent episode major depressive (HCC)  F31.81   3. Generalized anxiety disorder  F41.1   4. Insomnia, unspecified type  G47.00     Receiving Psychotherapy: No    RECOMMENDATIONS:  PDMP was reviewed. I provided 30 minutes of nonface-to-face care during this encounter. The Klonopin prescription given after hospitalization was filled on the 25th and was supposed to last 7 days.  She has already taken all  of it.  I reminded her she is to only follow directions on the bottle, no matter what type of drug it is.  Because there is history of abuse, per collateral info, and now OD, I will give her a lower dose of the Klonopin to last for 5 days and then will no longer give her any controlled substance.  She verbalizes understanding. We discussed the fact that her mental health is important and since the Seroquel worked really well in the past I strongly recommend she go back on that.  Not only will help her mood, it will help sleep also.  She is willing to retry it. I recommend BuSpar to help with anxiety.  Benefits, risks, and side effects were discussed and she accepts. Continue Prozac 20 mg, 1 p.o. every morning. Start BuSpar 15 mg 1/3 tablet twice daily for 1 week, then increase to 2/3 tablet twice daily for 1 week, then increase to 1 tablet twice daily for anxiety. Wean off Klonopin 0.5 mg, one half p.o. twice daily for 5 days and stop. Restart Seroquel X are 150 mg, 1 p.o. nightly. Discontinue Abilify which she never started in the first  place. Discontinue Ambien. Continue trazodone 100 mg, 1-2 nightly as needed sleep, but hopefully with the Seroquel on board she will not need that. I am not familiar with any specific counselors in [facility].  I recommend that she look online to find reputable counselors. Return in 4 weeks.  Melony Overly, PA-C

## 2019-11-06 ENCOUNTER — Emergency Department (HOSPITAL_BASED_OUTPATIENT_CLINIC_OR_DEPARTMENT_OTHER): Payer: Self-pay

## 2019-11-06 ENCOUNTER — Encounter (HOSPITAL_BASED_OUTPATIENT_CLINIC_OR_DEPARTMENT_OTHER): Payer: Self-pay | Admitting: Emergency Medicine

## 2019-11-06 ENCOUNTER — Other Ambulatory Visit: Payer: Self-pay

## 2019-11-06 ENCOUNTER — Emergency Department (HOSPITAL_BASED_OUTPATIENT_CLINIC_OR_DEPARTMENT_OTHER)
Admission: EM | Admit: 2019-11-06 | Discharge: 2019-11-06 | Disposition: A | Discharge status: Home / Self Care | Payer: Self-pay | Attending: Emergency Medicine | Admitting: Emergency Medicine

## 2019-11-06 DIAGNOSIS — Z87891 Personal history of nicotine dependence: Secondary | ICD-10-CM | POA: Insufficient documentation

## 2019-11-06 DIAGNOSIS — W108XXA Fall (on) (from) other stairs and steps, initial encounter: Secondary | ICD-10-CM | POA: Insufficient documentation

## 2019-11-06 DIAGNOSIS — Y998 Other external cause status: Secondary | ICD-10-CM | POA: Insufficient documentation

## 2019-11-06 DIAGNOSIS — W19XXXA Unspecified fall, initial encounter: Secondary | ICD-10-CM

## 2019-11-06 DIAGNOSIS — Y9389 Activity, other specified: Secondary | ICD-10-CM | POA: Insufficient documentation

## 2019-11-06 DIAGNOSIS — Z79899 Other long term (current) drug therapy: Secondary | ICD-10-CM | POA: Insufficient documentation

## 2019-11-06 DIAGNOSIS — S93491A Sprain of other ligament of right ankle, initial encounter: Secondary | ICD-10-CM | POA: Insufficient documentation

## 2019-11-06 DIAGNOSIS — I10 Essential (primary) hypertension: Secondary | ICD-10-CM | POA: Insufficient documentation

## 2019-11-06 DIAGNOSIS — Y9289 Other specified places as the place of occurrence of the external cause: Secondary | ICD-10-CM | POA: Insufficient documentation

## 2019-11-06 MED ORDER — ONDANSETRON 4 MG PO TBDP
4.0000 mg | ORAL_TABLET | Freq: Once | ORAL | Status: AC
  Administered 2019-11-06: 4 mg via ORAL
  Filled 2019-11-06: qty 1

## 2019-11-06 MED ORDER — ACETAMINOPHEN 500 MG PO TABS
1000.0000 mg | ORAL_TABLET | Freq: Once | ORAL | Status: AC
  Administered 2019-11-06: 1000 mg via ORAL
  Filled 2019-11-06: qty 2

## 2019-11-06 MED ORDER — OXYCODONE HCL 5 MG PO TABS
5.0000 mg | ORAL_TABLET | Freq: Once | ORAL | Status: AC
  Administered 2019-11-06: 5 mg via ORAL
  Filled 2019-11-06: qty 1

## 2019-11-06 MED ORDER — IBUPROFEN 800 MG PO TABS
800.0000 mg | ORAL_TABLET | Freq: Once | ORAL | Status: AC
  Administered 2019-11-06: 800 mg via ORAL
  Filled 2019-11-06: qty 1

## 2019-11-06 NOTE — Discharge Instructions (Signed)
Take 4 over the counter ibuprofen tablets 3 times a day or 2 over-the-counter naproxen tablets twice a day for pain. Also take tylenol 1000mg(2 extra strength) four times a day.    

## 2019-11-06 NOTE — ED Triage Notes (Signed)
Patient presents with complaints of right ankle pain and swelling sp fall down a step; states occurred yesterday at 2300. Difficulty ambulating due to pain.

## 2019-11-06 NOTE — ED Provider Notes (Signed)
[facility] EMERGENCY DEPARTMENT Provider Note   CSN: [id number] Arrival date & time: 11/06/19  0448     History Chief Complaint  Patient presents with  . Ankle Pain    Marilyn Fowler is a 44 y.o. female.  44 year old female with a chief complaints of right ankle pain.  She was walking down some stairs and her right foot slipped off the last stair and it was inverted.  She had pain and swelling to the lateral aspect of the ankle that radiates down to the foot.  Feels the pain is moving somewhat up the lower leg as well.  Denies any pain in the knee today denies other injury in the fall.  The history is provided by the patient.  Ankle Pain Location:  Ankle Injury: yes   Mechanism of injury: fall   Fall:    Fall occurred:  Down stairs   Height of fall:  1 step   Entrapped after fall: no   Ankle location:  R ankle Pain details:    Quality:  Aching and sharp   Radiates to:  Does not radiate   Severity:  Moderate   Onset quality:  Gradual   Duration:  2 hours   Timing:  Constant   Progression:  Unchanged Chronicity:  New Dislocation: no   Prior injury to area:  No Relieved by:  Nothing Worsened by:  Adduction, abduction, activity and bearing weight Ineffective treatments:  None tried Associated symptoms: no fever        Past Medical History:  Diagnosis Date  . Alcohol abuse   . Anemia   . Anxiety   . Blood transfusion without reported diagnosis   . Depression   . Hemorrhoid   . Hypertension   . Migraine     Patient Active Problem List   Diagnosis Date Noted  . Physical exam 08/07/2017  . Headache 06/30/2017  . Chronic right shoulder pain 09/04/2016  . Hypokalemia 04/30/2016  . Hypomagnesemia 04/30/2016  . Alcoholic hepatitis 04/30/2016  . Alcohol induced fatty liver 04/30/2016  . Alcohol abuse 04/29/2016  . Left medial knee pain 06/29/2015  . Acute bacterial sinusitis 10/11/2014  . Maxillary sinusitis, acute 09/14/2014  . Gastroenteritis  11/09/2013  . Pleuritic chest pain 09/17/2013  . Chest pain 11/18/2012  . Morbid obesity (HCC) 09/29/2012  . HTN (hypertension) 07/12/2012  . Migraine 07/12/2012  . Bipolar II disorder, most recent episode major depressive (HCC) 07/11/2012    Past Surgical History:  Procedure Laterality Date  . HEMORRHOID SURGERY    . HERNIA REPAIR    . TONSILLECTOMY    . VENTRAL HERNIA REPAIR       OB History   No obstetric history on file.     Family History  Problem Relation Age of Onset  . Osteoarthritis Mother   . Diabetes Mother   . Hypertension Mother   . Depression Mother   . Hypertension Father   . Diabetes Father   . Asthma Son   . Deep vein thrombosis Maternal Grandmother   . Deep vein thrombosis Maternal Grandfather   . Stroke Neg Hx     Social History   Tobacco Use  . Smoking status: Former Smoker    Years: 10.00  . Smokeless tobacco: Never Used  . Tobacco comment: smoked 1994-2012, up to 1 pp WEEK  Vaping Use  . Vaping Use: Never used  Substance Use Topics  . Alcohol use: No  . Drug use: No    Home Medications  Prior to Admission medications   Medication Sig Start Date End Date Taking? Authorizing Provider  amLODipine (NORVASC) 2.5 MG tablet TAKE 1 TABLET(2.5 MG) BY MOUTH DAILY FOR HIGH BLOOD PRESSURE 10/19/18   Sheliah Hatch, MD  busPIRone (BUSPAR) 15 MG tablet 1/3 po bid for 7 days, then 2/3 po bid for 7 days, then 1 po bid. 10/27/19   Claybon Jabs, [place] Nurse, PA-C  clonazePAM (KLONOPIN) 0.5 MG tablet Take 0.5 tablets (0.25 mg total) by mouth 2 (two) times daily as needed for anxiety. 10/27/19   Melony Overly T, PA-C  cyclobenzaprine (FLEXERIL) 10 MG tablet Take 1 tablet (10 mg total) by mouth 2 (two) times daily as needed for muscle spasms. Patient not taking: Reported on 10/27/2019 05/26/19   Renne Crigler, PA-C  FLUoxetine (PROZAC) 20 MG capsule Take by mouth. 10/23/19   [provider]  metoprolol succinate (TOPROL-XL) 25 MG 24 hr tablet TAKE 1 TABLET(25  MG) BY MOUTH DAILY 02/08/19   Sheliah Hatch, MD  QUEtiapine Fumarate (SEROQUEL XR) 150 MG 24 hr tablet Take 1 tablet (150 mg total) by mouth at bedtime. 10/27/19   Melony Overly T, PA-C  traZODone (DESYREL) 100 MG tablet TAKE 1 TO 2 TABLETS(100 TO 200 MG) BY MOUTH AT BEDTIME AS NEEDED FOR SLEEP 10/08/19   Melony Overly T, PA-C    Allergies    Methocarbamol and Toradol [ketorolac tromethamine]  Review of Systems   Review of Systems  Constitutional: Negative for chills and fever.  HENT: Negative for congestion and rhinorrhea.   Eyes: Negative for redness and visual disturbance.  Respiratory: Negative for shortness of breath and wheezing.   Cardiovascular: Negative for chest pain and palpitations.  Gastrointestinal: Negative for nausea and vomiting.  Genitourinary: Negative for dysuria and urgency.  Musculoskeletal: Positive for arthralgias. Negative for myalgias.  Skin: Negative for pallor and wound.  Neurological: Negative for dizziness and headaches.    Physical Exam Updated Vital Signs BP (!) 173/114 (BP Location: Right Arm)   Pulse (!) 126   Temp 97.8 F (36.6 C) (Oral)   Resp 20   Ht 5' (1.524 m)   Wt 127 kg   LMP 10/16/2019   SpO2 97%   BMI 54.68 kg/m   Physical Exam Vitals and nursing note reviewed.  Constitutional:      Comments: Awake, alert, nontoxic appearance.  HENT:     Head: Atraumatic.  Eyes:     General:        Right eye: No discharge.        Left eye: No discharge.  Pulmonary:     Effort: Pulmonary effort is normal.  Chest:     Chest wall: No tenderness.  Abdominal:     Palpations: Abdomen is soft.     Tenderness: There is no abdominal tenderness. There is no rebound.  Musculoskeletal:        General: Tenderness and deformity present.     Cervical back: Neck supple.     Comments: Pulse motor and sensation are intact to the right lower extremity.  She has pain and swelling along the ATF.  Pain along the lateral malleolus.  Pain along the base  of the fifth metatarsal.  No pain at the fibular neck.  Full range of motion of the knee.  Skin:    Findings: No rash.  Neurological:     Comments: Mental status and motor strength appears baseline for patient and situation.     ED Results / Procedures / Treatments  Labs (all labs ordered are listed, but only abnormal results are displayed) Labs Reviewed - No data to display  EKG None  Radiology DG Ankle Complete Right  Result Date: 11/06/2019 CLINICAL DATA:  Fall EXAM: RIGHT ANKLE - COMPLETE 3+ VIEW COMPARISON:  None. FINDINGS: There is no evidence of fracture, dislocation, or joint effusion. There is no evidence of arthropathy or other focal bone abnormality. Moderate soft tissue swelling at the lateral malleolus. IMPRESSION: Moderate soft tissue swelling at the lateral malleolus. No fracture or dislocation of the right ankle. Electronically Signed   By: Deatra Robinson M.D.   On: 11/06/2019 05:24    Procedures Procedures (including critical care time)  Medications Ordered in ED Medications  acetaminophen (TYLENOL) tablet 1,000 mg (1,000 mg Oral Given 11/06/19 0517)  oxyCODONE (Oxy IR/ROXICODONE) immediate release tablet 5 mg (5 mg Oral Given 11/06/19 0517)  ibuprofen (ADVIL) tablet 800 mg (800 mg Oral Given 11/06/19 9629)    ED Course  I have reviewed the triage vital signs and the nursing notes.  Pertinent labs & imaging results that were available during my care of the patient were reviewed by me and considered in my medical decision making (see chart for details).    MDM Rules/Calculators/A&P                          44 yo F with a chief complaints of right ankle pain after a inversion injury.  Pain along the ATF on exam.  Plain film viewed by me without fracture.  Will place in an ASO crutches PCP follow-up.  5:29 AM:  I have discussed the diagnosis/risks/treatment options with the patient and believe the pt to be eligible for discharge home to follow-up with PCP. We  also discussed returning to the ED immediately if new or worsening sx occur. We discussed the sx which are most concerning (e.g., sudden worsening pain, fever, inability to tolerate by mouth) that necessitate immediate return. Medications administered to the patient during their visit and any new prescriptions provided to the patient are listed below.  Medications given during this visit Medications  acetaminophen (TYLENOL) tablet 1,000 mg (1,000 mg Oral Given 11/06/19 0517)  oxyCODONE (Oxy IR/ROXICODONE) immediate release tablet 5 mg (5 mg Oral Given 11/06/19 0517)  ibuprofen (ADVIL) tablet 800 mg (800 mg Oral Given 11/06/19 0517)     The patient appears reasonably screen and/or stabilized for discharge and I doubt any other medical condition or other [facility] requiring further screening, evaluation, or treatment in the ED at this time prior to discharge.   Final Clinical Impression(s) / ED Diagnoses Final diagnoses:  Sprain of anterior talofibular ligament of right ankle, initial encounter    Rx / DC Orders ED Discharge Orders    None       Melene Plan, DO 11/06/19 5284

## 2019-11-19 ENCOUNTER — Telehealth (INDEPENDENT_AMBULATORY_CARE_PROVIDER_SITE_OTHER): Payer: Self-pay | Admitting: Physician Assistant

## 2019-11-19 ENCOUNTER — Telehealth: Payer: Self-pay | Admitting: Physician Assistant

## 2019-11-19 ENCOUNTER — Encounter: Payer: Self-pay | Admitting: Physician Assistant

## 2019-11-19 DIAGNOSIS — F411 Generalized anxiety disorder: Secondary | ICD-10-CM

## 2019-11-19 DIAGNOSIS — G4700 Insomnia, unspecified: Secondary | ICD-10-CM

## 2019-11-19 DIAGNOSIS — F3181 Bipolar II disorder: Secondary | ICD-10-CM

## 2019-11-19 DIAGNOSIS — F1310 Sedative, hypnotic or anxiolytic abuse, uncomplicated: Secondary | ICD-10-CM

## 2019-11-19 DIAGNOSIS — T424X2A Poisoning by benzodiazepines, intentional self-harm, initial encounter: Secondary | ICD-10-CM

## 2019-11-19 MED ORDER — FLUOXETINE HCL 20 MG PO CAPS: 20.0000 mg | ORAL_CAPSULE | Freq: Every day | ORAL | 2 refills | Status: DC

## 2019-11-19 MED ORDER — QUETIAPINE FUMARATE ER 300 MG PO TB24: 300.0000 mg | ORAL_TABLET | Freq: Every day | ORAL | 1 refills | Status: DC

## 2019-11-19 MED ORDER — BUSPIRONE HCL 15 MG PO TABS: 15.0000 mg | ORAL_TABLET | Freq: Two times a day (BID) | ORAL | 2 refills | Status: DC

## 2019-11-19 NOTE — Progress Notes (Signed)
[facility] Med Check  Patient ID: Marilyn Fowler,  MRN: [id number]  PCP: Sheliah Hatch, MD  Date of Evaluation: 11/19/2019 Time spent:40 minutes  Chief Complaint:  Chief Complaint    Anxiety; Depression; Insomnia     Virtual Visit via Telehealth  I connected with patient by a video enabled telemedicine application with their informed consent, and verified patient privacy and that I am speaking with the correct person using two identifiers.  I am private, in my office and the patient is at home.  I discussed the limitations, risks, security and privacy concerns of performing an evaluation and management service by video and the availability of in person appointments. I also discussed with the patient that there may be a patient responsible charge related to this service. The patient expressed understanding and agreed to proceed.   I discussed the assessment and treatment plan with the patient. The patient was provided an opportunity to ask questions and all were answered. The patient agreed with the plan and demonstrated an understanding of the instructions.   The patient was advised to call back or seek an in-person evaluation if the symptoms worsen or if the condition fails to improve as anticipated.  I provided 40  minutes of non-face-to-face time during this encounter.  HISTORY/CURRENT STATUS: HPI For routine med check.  At LOV, we added Buspar, restarted Seroquel XR, and weaned off Klonopin.  After we went through her medication list, I checked the [place] controlled substances registry and I saw that she had used the last refill on the Klonopin and picked up on 11/11/2019, however when going over her medication list she stated she was no longer taking the Klonopin.  States she is not taking it every day and is using it more for her ankle, which she sprained on 11/06/2019.  States she has been unable to see an orthopedist due to cost and hasn't been able to get  anything for pain or muscle relaxer to help and the Klonopin helps that.   As far as the anxiety and depression go, "I am surprised but the BuSpar is working really well.  I like it because it doesn't make me sleepy.  And the Seroquel and Prozac seems to be helping the depression.  I feel a lot better than I did when I got out of the hospital."  Feels like the Seroquel would be more helpful with sleep and mood if it was increased.  Due to the limitations of the acute ankle sprain, she has had trouble enjoying things.  She pretty much sits on the couch and ambulates only to the bathroom or kitchen or bedroom.  Unable to go anywhere because she can't drive as it is her right foot. Energy and motivation are mostly low because of the ankle sprain.  Not crying easily.  She denies suicidal or homicidal thoughts now.  Patient denies increased energy with decreased need for sleep, no increased talkativeness, no racing thoughts, no impulsivity or risky behaviors, no increased spending, no increased libido, no grandiosity, no increased irritability or anger, and no hallucinations.  Denies dizziness, syncope, seizures, numbness, tingling, tremor, tics, unsteady gait, slurred speech, confusion.  Has pain in her right ankle because of the recent sprain.    Individual Medical History/ Review of Systems: Changes? :Yes  sprained right ankle  Allergies: Methocarbamol and Toradol [ketorolac tromethamine]  Current Medications:  Current Outpatient Medications:  .  amLODipine (NORVASC) 2.5 MG tablet, TAKE 1 TABLET(2.5 MG) BY MOUTH DAILY FOR HIGH  BLOOD PRESSURE, Disp: 30 tablet, Rfl: 6 .  busPIRone (BUSPAR) 15 MG tablet, Take 1 tablet (15 mg total) by mouth 2 (two) times daily., Disp: 60 tablet, Rfl: 2 .  FLUoxetine (PROZAC) 20 MG capsule, Take 1 capsule (20 mg total) by mouth daily., Disp: 30 capsule, Rfl: 2 .  metoprolol succinate (TOPROL-XL) 25 MG 24 hr tablet, TAKE 1 TABLET(25 MG) BY MOUTH DAILY, Disp: 30 tablet,  Rfl: 6 .  traZODone (DESYREL) 100 MG tablet, TAKE 1 TO 2 TABLETS(100 TO 200 MG) BY MOUTH AT BEDTIME AS NEEDED FOR SLEEP, Disp: 60 tablet, Rfl: 5 .  cyclobenzaprine (FLEXERIL) 10 MG tablet, Take 1 tablet (10 mg total) by mouth 2 (two) times daily as needed for muscle spasms. (Patient not taking: Reported on 10/27/2019), Disp: 14 tablet, Rfl: 0 .  QUEtiapine (SEROQUEL XR) 300 MG 24 hr tablet, Take 1 tablet (300 mg total) by mouth at bedtime., Disp: 30 tablet, Rfl: 1 Medication Side Effects: none  Family Medical/ Social History: Changes? No  MENTAL HEALTH EXAM:  There were no vitals taken for this visit.There is no height or weight on file to calculate BMI.  General Appearance: Casual, Neat, Well Groomed and Obese  Eye Contact:  Good  Speech:  Clear and Coherent and Normal Rate  Volume:  Normal  Mood:  Euthymic  Affect:  Appropriate  Thought Process:  Goal Directed and Descriptions of Associations: Intact  Orientation:  Full (Time, Place, and Person)  Thought Content: Logical   Suicidal Thoughts:  No  Homicidal Thoughts:  No  Memory:  WNL  Judgement:  Good  Insight:  Good  Psychomotor Activity:  Normal from what I can see from the neck on the video.  Concentration:  Concentration: Good and Attention Span: Good  Recall:  Good  Fund of Knowledge: Good  Language: Good  Assets:  Desire for Improvement  ADL's:  Intact  Cognition: WNL  Prognosis:  Good    DIAGNOSES:    ICD-10-CM   1. Bipolar II disorder, most recent episode major depressive (HCC)  F31.81   2. Generalized anxiety disorder  F41.1   3. Suicide attempt by benzodiazepine overdose (HCC)  T42.4X2A   4. Insomnia, unspecified type  G47.00   5. Benzodiazepine abuse (HCC)  F13.10     Receiving Psychotherapy: Yes    RECOMMENDATIONS:  PDMP was reviewed. I provided 40 minutes of nonface-to-face time during this encounter. Reviewed med list history.  She has no other Klonopin refills available to her.  She is aware that if  she is taking the Klonopin twice a day, she will need to be weaned off as I had originally done last visit.  She promises she is not taking the Klonopin daily, that she is only taking it for her ankle because she doesn't have any pain medication or muscle relaxers.  I reminded her Klonopin is not prescribed for that and I do not want her taking it for that reason.  She states that she will not need to be weaned off again, I reiterated that this is not something she should go off cold [place] because of the risk of seizures.  She verbalizes understanding and states she will not need to wean off because she is taking it rarely.  If that changes, I expect her to call me so we can wean off, and I may need to prescribe Depakote or another antiseizure medication while she is at risk for seizures.  She verbalizes understanding and I do not think  it is necessary at this time. I reviewed her medication list and she has no further Klonopin refills. Continue BuSpar 15 mg, 1 p.o. twice daily. Continue Prozac 20 mg p.o. daily. Increase Seroquel XR to 300 mg p.o. nightly. Continue trazodone 100 mg, 1-2 nightly as needed sleep. Continue therapy. Return in 4 weeks.  Melony Overly, PA-C

## 2019-11-19 NOTE — Telephone Encounter (Signed)
[name], mcdade are scheduled for a virtual visit with your provider today.    Just as we do with appointments in the office, we must obtain your consent to participate.  Your consent will be active for this visit and any virtual visit you may have with one of our providers in the next 365 days.    If you have a MyChart account, I can also send a copy of this consent to you electronically.  All virtual visits are billed to your insurance company just like a traditional visit in the office.  As this is a virtual visit, video technology does not allow for your provider to perform a traditional examination.  This may limit your provider's ability to fully assess your condition.  If your provider identifies any concerns that need to be evaluated in person or the need to arrange testing such as labs, EKG, etc, we will make arrangements to do so.    Although advances in technology are sophisticated, we cannot ensure that it will always work on either your end or our end.  If the connection with a video visit is poor, we may have to switch to a telephone visit.  With either a video or telephone visit, we are not always able to ensure that we have a secure connection.   I need to obtain your verbal consent now.   Are you willing to proceed with your visit today?   Marilyn Fowler has provided verbal consent on 11/19/2019 for a virtual visit (video or telephone).   Melony Overly, PA-C 11/19/2019  9:40 AM

## 2019-12-26 ENCOUNTER — Other Ambulatory Visit: Payer: Self-pay | Admitting: Physician Assistant

## 2020-01-12 ENCOUNTER — Other Ambulatory Visit: Payer: Self-pay

## 2020-01-12 ENCOUNTER — Emergency Department (HOSPITAL_COMMUNITY): Payer: Self-pay

## 2020-01-12 ENCOUNTER — Encounter (HOSPITAL_COMMUNITY): Payer: Self-pay

## 2020-01-12 ENCOUNTER — Emergency Department (HOSPITAL_COMMUNITY)
Admission: EM | Admit: 2020-01-12 | Discharge: 2020-01-13 | Disposition: A | Discharge status: Other Acute Inpatient Hospital | Payer: Self-pay | Attending: Emergency Medicine | Admitting: Emergency Medicine

## 2020-01-12 ENCOUNTER — Telehealth: Payer: Self-pay | Admitting: Physician Assistant

## 2020-01-12 DIAGNOSIS — K529 Noninfective gastroenteritis and colitis, unspecified: Secondary | ICD-10-CM | POA: Insufficient documentation

## 2020-01-12 DIAGNOSIS — R519 Headache, unspecified: Secondary | ICD-10-CM | POA: Insufficient documentation

## 2020-01-12 DIAGNOSIS — I10 Essential (primary) hypertension: Secondary | ICD-10-CM | POA: Insufficient documentation

## 2020-01-12 DIAGNOSIS — M25511 Pain in right shoulder: Secondary | ICD-10-CM | POA: Insufficient documentation

## 2020-01-12 DIAGNOSIS — K700 Alcoholic fatty liver: Secondary | ICD-10-CM | POA: Insufficient documentation

## 2020-01-12 DIAGNOSIS — N3000 Acute cystitis without hematuria: Secondary | ICD-10-CM

## 2020-01-12 DIAGNOSIS — R45851 Suicidal ideations: Secondary | ICD-10-CM

## 2020-01-12 DIAGNOSIS — E876 Hypokalemia: Secondary | ICD-10-CM | POA: Insufficient documentation

## 2020-01-12 DIAGNOSIS — X509XXA Other and unspecified overexertion or strenuous movements or postures, initial encounter: Secondary | ICD-10-CM | POA: Insufficient documentation

## 2020-01-12 DIAGNOSIS — R071 Chest pain on breathing: Secondary | ICD-10-CM | POA: Insufficient documentation

## 2020-01-12 DIAGNOSIS — F1010 Alcohol abuse, uncomplicated: Secondary | ICD-10-CM | POA: Insufficient documentation

## 2020-01-12 DIAGNOSIS — K7010 Alcoholic hepatitis without ascites: Secondary | ICD-10-CM | POA: Insufficient documentation

## 2020-01-12 DIAGNOSIS — Z20822 Contact with and (suspected) exposure to covid-19: Secondary | ICD-10-CM | POA: Insufficient documentation

## 2020-01-12 DIAGNOSIS — J019 Acute sinusitis, unspecified: Secondary | ICD-10-CM | POA: Insufficient documentation

## 2020-01-12 DIAGNOSIS — S93401A Sprain of unspecified ligament of right ankle, initial encounter: Secondary | ICD-10-CM | POA: Insufficient documentation

## 2020-01-12 DIAGNOSIS — F332 Major depressive disorder, recurrent severe without psychotic features: Secondary | ICD-10-CM | POA: Insufficient documentation

## 2020-01-12 LAB — COMPREHENSIVE METABOLIC PANEL
ALT: 15 U/L (ref 0–44)
AST: 20 U/L (ref 15–41)
Albumin: 3.7 g/dL (ref 3.5–5.0)
Alkaline Phosphatase: 81 U/L (ref 38–126)
Anion gap: 10 (ref 5–15)
BUN: 10 mg/dL (ref 6–20)
CO2: 23 mmol/L (ref 22–32)
Calcium: 8.7 mg/dL — ABNORMAL LOW (ref 8.9–10.3)
Chloride: 105 mmol/L (ref 98–111)
Creatinine, Ser: 0.76 mg/dL (ref 0.44–1.00)
GFR calc Af Amer: >60 mL/min (ref >60)
GFR calc non Af Amer: >60 mL/min (ref >60)
Glucose, Bld: 93 mg/dL (ref 70–99)
Potassium: 4.1 mmol/L (ref 3.5–5.1)
Sodium: 138 mmol/L (ref 135–145)
Total Bilirubin: 0.5 mg/dL (ref 0.3–1.2)
Total Protein: 6.8 g/dL (ref 6.5–8.1)

## 2020-01-12 LAB — URINALYSIS, ROUTINE W REFLEX MICROSCOPIC
Bilirubin Urine: NEGATIVE
Glucose, UA: NEGATIVE mg/dL
Hgb urine dipstick: NEGATIVE
Ketones, ur: NEGATIVE mg/dL
Nitrite: NEGATIVE
Protein, ur: NEGATIVE mg/dL
Specific Gravity, Urine: 1.018 (ref 1.005–1.030)
WBC, UA: >50 WBC/hpf — ABNORMAL HIGH (ref 0–5)
pH: 6 (ref 5.0–8.0)

## 2020-01-12 LAB — CBC WITH DIFFERENTIAL/PLATELET
Abs Immature Granulocytes: 0.04 10*3/uL (ref 0.00–0.07)
Basophils Absolute: 0 10*3/uL (ref 0.0–0.1)
Basophils Relative: 0 %
Eosinophils Absolute: 0.2 10*3/uL (ref 0.0–0.5)
Eosinophils Relative: 2 %
HCT: 34.8 % — ABNORMAL LOW (ref 36.0–46.0)
Hemoglobin: 10.4 g/dL — ABNORMAL LOW (ref 12.0–15.0)
Immature Granulocytes: 0 %
Lymphocytes Relative: 30 %
Lymphs Abs: 3.3 10*3/uL (ref 0.7–4.0)
MCH: 23.4 pg — ABNORMAL LOW (ref 26.0–34.0)
MCHC: 29.9 g/dL — ABNORMAL LOW (ref 30.0–36.0)
MCV: 78.2 fL — ABNORMAL LOW (ref 80.0–100.0)
Monocytes Absolute: 1 10*3/uL (ref 0.1–1.0)
Monocytes Relative: 9 %
Neutro Abs: 6.6 10*3/uL (ref 1.7–7.7)
Neutrophils Relative %: 59 %
Platelets: 371 10*3/uL (ref 150–400)
RBC: 4.45 MIL/uL (ref 3.87–5.11)
RDW: 18.2 % — ABNORMAL HIGH (ref 11.5–15.5)
WBC: 11.2 10*3/uL — ABNORMAL HIGH (ref 4.0–10.5)
nRBC: 0 % (ref 0.0–0.2)

## 2020-01-12 LAB — RAPID URINE DRUG SCREEN, HOSP PERFORMED
Amphetamines: NOT DETECTED
Barbiturates: NOT DETECTED
Benzodiazepines: NOT DETECTED
Cocaine: NOT DETECTED
Opiates: NOT DETECTED
Tetrahydrocannabinol: NOT DETECTED

## 2020-01-12 LAB — ETHANOL: Alcohol, Ethyl (B): <10 mg/dL (ref <10)

## 2020-01-12 LAB — SALICYLATE LEVEL: Salicylate Lvl: <7 mg/dL — ABNORMAL LOW (ref 7.0–30.0)

## 2020-01-12 LAB — ACETAMINOPHEN LEVEL: Acetaminophen (Tylenol), Serum: <10 ug/mL — ABNORMAL LOW (ref 10–30)

## 2020-01-12 LAB — I-STAT BETA HCG BLOOD, ED (MC, WL, AP ONLY): I-stat hCG, quantitative: <5 m[IU]/mL (ref <5)

## 2020-01-12 MED ORDER — CEPHALEXIN 500 MG PO CAPS
1000.0000 mg | ORAL_CAPSULE | Freq: Once | ORAL | Status: AC
  Administered 2020-01-12: 1000 mg via ORAL
  Filled 2020-01-12: qty 2

## 2020-01-12 MED ORDER — CEPHALEXIN 500 MG PO CAPS: 1000.0000 mg | ORAL_CAPSULE | Freq: Two times a day (BID) | ORAL | 0 refills | Status: DC

## 2020-01-12 MED ORDER — ACETAMINOPHEN 500 MG PO TABS
1000.0000 mg | ORAL_TABLET | Freq: Once | ORAL | Status: AC
  Administered 2020-01-12: 1000 mg via ORAL
  Filled 2020-01-12: qty 2

## 2020-01-12 MED ORDER — PHENAZOPYRIDINE HCL 200 MG PO TABS: 200.0000 mg | ORAL_TABLET | Freq: Three times a day (TID) | ORAL | 0 refills | Status: DC | PRN

## 2020-01-12 MED ORDER — PHENAZOPYRIDINE HCL 200 MG PO TABS
200.0000 mg | ORAL_TABLET | Freq: Once | ORAL | Status: AC
  Administered 2020-01-12: 200 mg via ORAL
  Filled 2020-01-12: qty 1

## 2020-01-12 NOTE — Telephone Encounter (Signed)
Rtn call to Marilyn Fowler who was currently with patient. Patient reports feeling more depressed, crying everyday, hopeless, she's given up. She is actively suicidal, patient states she would have done something yesterday if Marilyn Fowler hadn't been with her. I know she has a history of being admitted for suicide. Marilyn Fowler asked if her medications could be adjusted or should they get her admitted. I Instructed them to go to Behavioral Health to be assessed and admitted, she had already contacted them as well. She will contact me after assessed, assuming she will be admitted.   Marilyn Fowler was more upset our office had not contacted her before now, reports she left a voicemail yesterday 01/11/2020 between 915-930 am. Informed her I did not get a message but I would check if anyone else was notified. I apologized for not following up and would update Buyer, retail, the [employer] production planner. I explained the process of how messages are received and sent to nurse. She thought the voicemail went directly to nurse because she pushed # "3" I think she said. I did let her know I will follow up with her.

## 2020-01-12 NOTE — ED Provider Notes (Addendum)
[facility]-EMERGENCY DEPT Provider Note   CSN: [id number] Arrival date & time: 01/12/20  2011     History Chief Complaint  Patient presents with  . Ankle Injury    Marilyn Fowler is a 44 y.o. female.  HPI Patient reports that she sprained her ankle several months ago.  She was seen and x-rayed at that time without any fracture.  She was put in a lace up ankle support.  She has been wearing that pretty consistently because she continues to feel like her ankle is a bit unstable.  She stopped wearing it a few days ago and then walking down the stairs 2 days ago rolled her right ankle again.  She reports the foot and ankle become very swollen.  Hurts a lot on the outside of the ankle.  Patient reports that due to persisting pain and swelling she want to get it evaluated.  Patient reports that she plans to go to behavioral health Hospital.  She has had increasing depression over a number of months.  Reports she cries frequently and for no particular reason.  She has had suicidal thoughts without a plan.  She reports her mother is come to stay with her and has been watching her closely but she talk to her psychiatrist and they reported she needed to go to the behavioral health Hospital.  Patient reports that she came to the emergency department for evaluation of her ankle before going to behavioral health for anticipated inpatient treatment.  Patient reports she has been having some burning and urgency with urination.  No abdominal pain or back pain.  She reports she has had a low-grade fever.  Never above 100.  No cough, shortness of breath, vomiting or diarrhea.  Patient reports she has had her Covid vaccines.    Past Medical History:  Diagnosis Date  . Alcohol abuse   . Anemia   . Anxiety   . Blood transfusion without reported diagnosis   . Depression   . Hemorrhoid   . Hypertension   . Migraine     Patient Active Problem List   Diagnosis Date Noted  . Physical  exam 08/07/2017  . Headache 06/30/2017  . Chronic right shoulder pain 09/04/2016  . Hypokalemia 04/30/2016  . Hypomagnesemia 04/30/2016  . Alcoholic hepatitis 04/30/2016  . Alcohol induced fatty liver 04/30/2016  . Alcohol abuse 04/29/2016  . Left medial knee pain 06/29/2015  . Acute bacterial sinusitis 10/11/2014  . Maxillary sinusitis, acute 09/14/2014  . Gastroenteritis 11/09/2013  . Pleuritic chest pain 09/17/2013  . Chest pain 11/18/2012  . Morbid obesity (HCC) 09/29/2012  . HTN (hypertension) 07/12/2012  . Migraine 07/12/2012  . Bipolar II disorder, most recent episode major depressive (HCC) 07/11/2012    Past Surgical History:  Procedure Laterality Date  . HEMORRHOID SURGERY    . HERNIA REPAIR    . TONSILLECTOMY    . VENTRAL HERNIA REPAIR       OB History   No obstetric history on file.     Family History  Problem Relation Age of Onset  . Osteoarthritis Mother   . Diabetes Mother   . Hypertension Mother   . Depression Mother   . Hypertension Father   . Diabetes Father   . Asthma Son   . Deep vein thrombosis Maternal Grandmother   . Deep vein thrombosis Maternal Grandfather   . Stroke Neg Hx     Social History   Tobacco Use  . Smoking status: Former Smoker  Years: 10.00  . Smokeless tobacco: Never Used  . Tobacco comment: smoked 1994-2012, up to 1 pp WEEK  Vaping Use  . Vaping Use: Never used  Substance Use Topics  . Alcohol use: No  . Drug use: No    Home Medications Prior to Admission medications   Medication Sig Start Date End Date Taking? Authorizing Provider  amLODipine (NORVASC) 2.5 MG tablet TAKE 1 TABLET(2.5 MG) BY MOUTH DAILY FOR HIGH BLOOD PRESSURE 10/19/18   Sheliah Hatch, MD  busPIRone (BUSPAR) 15 MG tablet Take 1 tablet (15 mg total) by mouth 2 (two) times daily. 11/19/19   Melony Overly T, PA-C  cephALEXin (KEFLEX) 500 MG capsule Take 2 capsules (1,000 mg total) by mouth 2 (two) times daily. 01/12/20   Arby Barrette, MD   cyclobenzaprine (FLEXERIL) 10 MG tablet Take 1 tablet (10 mg total) by mouth 2 (two) times daily as needed for muscle spasms. Patient not taking: Reported on 10/27/2019 05/26/19   Renne Crigler, PA-C  FLUoxetine (PROZAC) 20 MG capsule Take 1 capsule (20 mg total) by mouth daily. 11/19/19   Melony Overly T, PA-C  metoprolol succinate (TOPROL-XL) 25 MG 24 hr tablet TAKE 1 TABLET(25 MG) BY MOUTH DAILY 02/08/19   Sheliah Hatch, MD  phenazopyridine (PYRIDIUM) 200 MG tablet Take 1 tablet (200 mg total) by mouth 3 (three) times daily as needed for pain. 01/12/20   Arby Barrette, MD  QUEtiapine (SEROQUEL XR) 300 MG 24 hr tablet Take 1 tablet (300 mg total) by mouth at bedtime. 11/19/19   Melony Overly T, PA-C  traZODone (DESYREL) 100 MG tablet TAKE 1 TO 2 TABLETS(100 TO 200 MG) BY MOUTH AT BEDTIME AS NEEDED FOR SLEEP 10/08/19   Claybon Jabs, Glade Nurse, PA-C    Allergies    Methocarbamol and Toradol [ketorolac tromethamine]  Review of Systems   Review of Systems 10 systems reviewed and negative except as per HPI Physical Exam Updated Vital Signs BP (!) 168/126 (BP Location: Left Arm)   Pulse (!) 109   Temp 99 F (37.2 C) (Oral)   Resp 17   LMP 12/14/2019   SpO2 100%   Physical Exam Constitutional:      Comments: Alert and nontoxic.  Well in appearance.  HENT:     Head: Normocephalic and atraumatic.     Mouth/Throat:     Pharynx: Oropharynx is clear.  Eyes:     Extraocular Movements: Extraocular movements intact.  Cardiovascular:     Rate and Rhythm: Normal rate and regular rhythm.  Pulmonary:     Effort: Pulmonary effort is normal.     Breath sounds: Normal breath sounds.  Abdominal:     General: There is no distension.     Palpations: Abdomen is soft.     Tenderness: There is no abdominal tenderness. There is no guarding.  Musculoskeletal:     Comments: Moderate swelling over the lateral malleolus on the right.  Moderate swelling of the dorsum of the foot.  Calf is soft and  nontender.  Toes are warm and dry.  No ecchymosis or discoloration at this time.  Skin:    General: Skin is warm and dry.  Neurological:     General: No focal deficit present.     Mental Status: She is oriented to person, place, and time.     Coordination: Coordination normal.     ED Results / Procedures / Treatments   Labs (all labs ordered are listed, but only abnormal results are displayed) Labs Reviewed  URINALYSIS, ROUTINE W REFLEX MICROSCOPIC - Abnormal; Notable for the following components:      Result Value   APPearance HAZY (*)    Leukocytes,Ua LARGE (*)    WBC, UA >50 (*)    Bacteria, UA RARE (*)    All other components within normal limits  COMPREHENSIVE METABOLIC PANEL - Abnormal; Notable for the following components:   Calcium 8.7 (*)    All other components within normal limits  CBC WITH DIFFERENTIAL/PLATELET - Abnormal; Notable for the following components:   WBC 11.2 (*)    Hemoglobin 10.4 (*)    HCT 34.8 (*)    MCV 78.2 (*)    MCH 23.4 (*)    MCHC 29.9 (*)    RDW 18.2 (*)    All other components within normal limits  ACETAMINOPHEN LEVEL - Abnormal; Notable for the following components:   Acetaminophen (Tylenol), Serum <10 (*)    All other components within normal limits  SALICYLATE LEVEL - Abnormal; Notable for the following components:   Salicylate Lvl <7.0 (*)    All other components within normal limits  SARS CORONAVIRUS 2 BY RT PCR (HOSPITAL ORDER, PERFORMED IN [facility])  URINE CULTURE  ETHANOL  RAPID URINE DRUG SCREEN, HOSP PERFORMED  I-STAT BETA HCG BLOOD, ED ([facility], [facility], [facility] ONLY)    EKG None  Radiology DG Ankle Complete Right  Result Date: 01/12/2020 CLINICAL DATA:  Status post injury EXAM: RIGHT ANKLE - COMPLETE 3+ VIEW COMPARISON:  Xr right ankle 11/06/19. FINDINGS: There is no evidence of fracture, dislocation, or joint effusion. There is no evidence of arthropathy or other focal bone abnormality. Subcutaneous soft tissue edema  that is diffuse but more apparent along the lateral right ankle. IMPRESSION: No acute fracture of dislocation of the bones of the right ankle. Electronically Signed   By: Tish Frederickson M.D.   On: 01/12/2020 21:34    Procedures Procedures (including critical care time)  Medications Ordered in ED Medications  cephALEXin (KEFLEX) capsule 1,000 mg (has no administration in time range)  phenazopyridine (PYRIDIUM) tablet 200 mg (has no administration in time range)  acetaminophen (TYLENOL) tablet 1,000 mg (1,000 mg Oral Given 01/12/20 2305)    ED Course  I have reviewed the triage vital signs and the nursing notes.  Pertinent labs & imaging results that were available during my care of the patient were reviewed by me and considered in my medical decision making (see chart for details).    MDM Rules/Calculators/A&P                          Consult: Reviewed with Nira Conn for transfer to [facility]  Patient presents with 2 complaints.  One is ankle injury.  X-rays negative.  Patient had a bad sprain several months ago and recently stopped wearing her ASO.  She does have swelling of the lateral malleolus area and top of the foot.  Patient is counseled on Ace wrap elevation and ice and a cam boot ordered. Patient has had contact with her psychiatrist with increasing depression and tearfulness.  She has had passive suicidal ideation without a plan.  Will obtain screening labs for psychiatric admission.  Anticipate patient will be transferred to behavioral health urgent care once medically cleared.  Urinalysis is positive for UTI.  Will start on Keflex and Pyridium.  Chemistry panel normal, no electrolyte or renal derangements.  Patient is clinically well in appearance with clear mental status.  Medically cleared for psychiatric treatment. Final Clinical Impression(s) / ED Diagnoses Final diagnoses:  Sprain of right ankle, unspecified ligament, initial encounter  Severe episode of recurrent major  depressive disorder, without psychotic features (HCC)  Passive suicidal ideations  Acute cystitis without hematuria    Rx / DC Orders ED Discharge Orders         Ordered    cephALEXin (KEFLEX) 500 MG capsule  2 times daily        01/12/20 2329    phenazopyridine (PYRIDIUM) 200 MG tablet  3 times daily PRN        01/12/20 2329           Arby BarrettePfeiffer, Lyllian Gause, MD 01/12/20 2239    Arby BarrettePfeiffer, Nuriyah Hanline, MD 01/12/20 2332

## 2020-01-12 NOTE — Telephone Encounter (Signed)
Patient is suicidal, depressed. Does she need to get to the ER or behavioral health emergency? [phone].

## 2020-01-12 NOTE — Discharge Instructions (Signed)
1.  Wear Ace wrap and walking boot.  Tried elevate your ankle and foot as much as possible.  Apply well wrapped ice pack to the ankle for 20 minutes every 2 hours.  Get a follow-up appointment with your orthopedic doctor. 2.  You have a urinary tract infection.  Take Keflex as prescribed.  You may take Pyridium for burning or urgency feeling.  You may discontinue the Pyridium once your symptoms have improved.  Finish your antibiotics as prescribed. 3.  You are being referred to the behavioral health urgent care for further evaluation for depression and suicidal thoughts.  Return to the emergency department immediately if you have worsening symptoms or plan for killing or injuring yourself.

## 2020-01-12 NOTE — ED Triage Notes (Signed)
Patient arrived stating that she has been depressed with SI and now plan, reports now going to [facility]. States she has a previous ankle injury and re injured it last night and wants to be seen before going to [facility]. Also states she feels like she as a UTI, due to burning and pressure with urination.

## 2020-01-12 NOTE — Telephone Encounter (Signed)
Marilyn Fowler, thank you for sending her straight to [facility]). She definitely needs emergent care. I didn't get a message concerning Marilyn Fowler yesterday either. I was on call last night and did not receive a call then either. I am not sure where the communication broke down, this is not typical in our office and I am glad Marilyn Fowler called again today.  We will look into this further and try to find the problem in communication.  Please apologize for me too, and on behalf of our office.  The staff at urgent care will be able to help her in this emergency and I will provide care after she is evaluated and treated there.

## 2020-01-12 NOTE — ED Notes (Signed)
Patient transported to X-ray 

## 2020-01-13 ENCOUNTER — Encounter (HOSPITAL_COMMUNITY): Payer: Self-pay | Admitting: Emergency Medicine

## 2020-01-13 ENCOUNTER — Ambulatory Visit (HOSPITAL_COMMUNITY)
Admission: EM | Admit: 2020-01-13 | Discharge: 2020-01-13 | Disposition: A | Discharge status: Other Acute Inpatient Hospital | Payer: No Payment, Other | Attending: Nurse Practitioner | Admitting: Nurse Practitioner

## 2020-01-13 ENCOUNTER — Encounter: Payer: Self-pay | Admitting: Psychiatry

## 2020-01-13 ENCOUNTER — Inpatient Hospital Stay
Admission: AD | Admit: 2020-01-13 | Discharge: 2020-01-18 | DRG: 885 | Disposition: A | Discharge status: Home / Self Care | Payer: No Typology Code available for payment source | Source: Intra-hospital | Attending: Psychiatry | Admitting: Psychiatry

## 2020-01-13 ENCOUNTER — Other Ambulatory Visit: Payer: Self-pay

## 2020-01-13 DIAGNOSIS — Z9109 Other allergy status, other than to drugs and biological substances: Secondary | ICD-10-CM

## 2020-01-13 DIAGNOSIS — F3130 Bipolar disorder, current episode depressed, mild or moderate severity, unspecified: Secondary | ICD-10-CM | POA: Diagnosis present

## 2020-01-13 DIAGNOSIS — Z87891 Personal history of nicotine dependence: Secondary | ICD-10-CM | POA: Insufficient documentation

## 2020-01-13 DIAGNOSIS — I10 Essential (primary) hypertension: Secondary | ICD-10-CM | POA: Diagnosis present

## 2020-01-13 DIAGNOSIS — R45851 Suicidal ideations: Secondary | ICD-10-CM | POA: Diagnosis present

## 2020-01-13 DIAGNOSIS — S93409A Sprain of unspecified ligament of unspecified ankle, initial encounter: Secondary | ICD-10-CM | POA: Diagnosis present

## 2020-01-13 DIAGNOSIS — Z818 Family history of other mental and behavioral disorders: Secondary | ICD-10-CM | POA: Insufficient documentation

## 2020-01-13 DIAGNOSIS — F319 Bipolar disorder, unspecified: Secondary | ICD-10-CM | POA: Diagnosis not present

## 2020-01-13 DIAGNOSIS — Z915 Personal history of self-harm: Secondary | ICD-10-CM | POA: Insufficient documentation

## 2020-01-13 DIAGNOSIS — Z23 Encounter for immunization: Secondary | ICD-10-CM | POA: Diagnosis not present

## 2020-01-13 DIAGNOSIS — F3181 Bipolar II disorder: Secondary | ICD-10-CM | POA: Diagnosis not present

## 2020-01-13 DIAGNOSIS — F419 Anxiety disorder, unspecified: Secondary | ICD-10-CM | POA: Diagnosis present

## 2020-01-13 DIAGNOSIS — X58XXXA Exposure to other specified factors, initial encounter: Secondary | ICD-10-CM | POA: Diagnosis present

## 2020-01-13 DIAGNOSIS — K529 Noninfective gastroenteritis and colitis, unspecified: Secondary | ICD-10-CM | POA: Diagnosis present

## 2020-01-13 DIAGNOSIS — G4700 Insomnia, unspecified: Secondary | ICD-10-CM | POA: Diagnosis present

## 2020-01-13 DIAGNOSIS — Z79899 Other long term (current) drug therapy: Secondary | ICD-10-CM | POA: Insufficient documentation

## 2020-01-13 LAB — POCT URINE DRUG SCREEN - MANUAL ENTRY (I-SCREEN)
POC Amphetamine UR: NOT DETECTED
POC Buprenorphine (BUP): NOT DETECTED
POC Cocaine UR: NOT DETECTED
POC Marijuana UR: NOT DETECTED
POC Methadone UR: NOT DETECTED
POC Methamphetamine UR: NOT DETECTED
POC Morphine: NOT DETECTED
POC Oxazepam (BZO): NOT DETECTED
POC Oxycodone UR: NOT DETECTED
POC Secobarbital (BAR): NOT DETECTED

## 2020-01-13 LAB — SARS CORONAVIRUS 2 BY RT PCR (HOSPITAL ORDER, PERFORMED IN ~~LOC~~ HOSPITAL LAB): SARS Coronavirus 2: NEGATIVE

## 2020-01-13 LAB — POCT PREGNANCY, URINE: Preg Test, Ur: NEGATIVE

## 2020-01-13 MED ORDER — MAGNESIUM HYDROXIDE 400 MG/5ML PO SUSP: 30.0000 mL | Freq: Every day | ORAL | Status: DC | PRN

## 2020-01-13 MED ORDER — ALUM & MAG HYDROXIDE-SIMETH 200-200-20 MG/5ML PO SUSP: 30.0000 mL | ORAL | Status: DC | PRN

## 2020-01-13 MED ORDER — FLUOXETINE HCL 20 MG PO CAPS
20.0000 mg | ORAL_CAPSULE | Freq: Once | ORAL | Status: AC
  Administered 2020-01-13: 20 mg via ORAL
  Filled 2020-01-13: qty 1

## 2020-01-13 MED ORDER — BUSPIRONE HCL 5 MG PO TABS
15.0000 mg | ORAL_TABLET | Freq: Two times a day (BID) | ORAL | Status: DC
  Administered 2020-01-14: 15 mg via ORAL
  Filled 2020-01-13: qty 3

## 2020-01-13 MED ORDER — AMLODIPINE BESYLATE 5 MG PO TABS
5.0000 mg | ORAL_TABLET | Freq: Once | ORAL | Status: AC
  Administered 2020-01-13: 5 mg via ORAL
  Filled 2020-01-13: qty 1

## 2020-01-13 MED ORDER — FLUOXETINE HCL 40 MG PO CAPS: 40.0000 mg | ORAL_CAPSULE | Freq: Every day | ORAL | 0 refills | Status: DC

## 2020-01-13 MED ORDER — QUETIAPINE FUMARATE 25 MG PO TABS
25.0000 mg | ORAL_TABLET | Freq: Two times a day (BID) | ORAL | Status: DC
  Administered 2020-01-13: 25 mg via ORAL
  Filled 2020-01-13: qty 1

## 2020-01-13 MED ORDER — HYDROCODONE-ACETAMINOPHEN 5-325 MG PO TABS
1.0000 | ORAL_TABLET | Freq: Four times a day (QID) | ORAL | Status: DC | PRN
  Administered 2020-01-13 – 2020-01-14 (×2): 1 via ORAL
  Filled 2020-01-13 (×3): qty 1

## 2020-01-13 MED ORDER — INFLUENZA VAC SPLIT QUAD 0.5 ML IM SUSY
0.5000 mL | PREFILLED_SYRINGE | INTRAMUSCULAR | Status: AC
  Administered 2020-01-14: 0.5 mL via INTRAMUSCULAR
  Filled 2020-01-13: qty 0.5

## 2020-01-13 MED ORDER — HYDROCODONE-ACETAMINOPHEN 5-325 MG PO TABS
1.0000 | ORAL_TABLET | Freq: Once | ORAL | Status: AC
  Administered 2020-01-13: 1 via ORAL
  Filled 2020-01-13: qty 1

## 2020-01-13 MED ORDER — QUETIAPINE FUMARATE 25 MG PO TABS: 25.0000 mg | ORAL_TABLET | Freq: Two times a day (BID) | ORAL | 0 refills | Status: DC

## 2020-01-13 MED ORDER — ACETAMINOPHEN 325 MG PO TABS: 650.0000 mg | ORAL_TABLET | Freq: Four times a day (QID) | ORAL | Status: DC | PRN

## 2020-01-13 MED ORDER — AMLODIPINE BESYLATE 5 MG PO TABS
2.5000 mg | ORAL_TABLET | Freq: Every day | ORAL | Status: DC
  Administered 2020-01-13: 2.5 mg via ORAL
  Filled 2020-01-13: qty 1

## 2020-01-13 MED ORDER — METOPROLOL SUCCINATE ER 25 MG PO TB24
25.0000 mg | ORAL_TABLET | Freq: Every day | ORAL | Status: DC
  Administered 2020-01-14 – 2020-01-18 (×5): 25 mg via ORAL
  Filled 2020-01-13 (×5): qty 1

## 2020-01-13 MED ORDER — FLUOXETINE HCL 20 MG PO CAPS
20.0000 mg | ORAL_CAPSULE | Freq: Every day | ORAL | Status: DC
  Administered 2020-01-13: 20 mg via ORAL
  Filled 2020-01-13: qty 1

## 2020-01-13 MED ORDER — CEPHALEXIN 500 MG PO CAPS
1000.0000 mg | ORAL_CAPSULE | Freq: Two times a day (BID) | ORAL | Status: DC
  Administered 2020-01-13 – 2020-01-18 (×10): 1000 mg via ORAL
  Filled 2020-01-13 (×10): qty 2

## 2020-01-13 MED ORDER — QUETIAPINE FUMARATE 200 MG PO TABS
300.0000 mg | ORAL_TABLET | Freq: Every day | ORAL | Status: DC
  Administered 2020-01-13 – 2020-01-17 (×5): 300 mg via ORAL
  Filled 2020-01-13 (×5): qty 1

## 2020-01-13 MED ORDER — PHENAZOPYRIDINE HCL 100 MG PO TABS: 200.0000 mg | ORAL_TABLET | Freq: Three times a day (TID) | ORAL | Status: DC | PRN

## 2020-01-13 MED ORDER — PHENAZOPYRIDINE HCL 200 MG PO TABS
200.0000 mg | ORAL_TABLET | Freq: Three times a day (TID) | ORAL | Status: DC | PRN
  Filled 2020-01-13: qty 1

## 2020-01-13 MED ORDER — FLUOXETINE HCL 20 MG PO CAPS: 40.0000 mg | ORAL_CAPSULE | Freq: Every day | ORAL | Status: DC

## 2020-01-13 MED ORDER — QUETIAPINE FUMARATE 300 MG PO TABS: 300.0000 mg | ORAL_TABLET | Freq: Every day | ORAL | 0 refills | Status: DC

## 2020-01-13 MED ORDER — CEPHALEXIN 250 MG PO CAPS
1000.0000 mg | ORAL_CAPSULE | Freq: Two times a day (BID) | ORAL | Status: DC
  Administered 2020-01-13 (×2): 1000 mg via ORAL
  Filled 2020-01-13 (×2): qty 4

## 2020-01-13 MED ORDER — QUETIAPINE FUMARATE ER 300 MG PO TB24: 300.0000 mg | ORAL_TABLET | Freq: Every day | ORAL | Status: DC

## 2020-01-13 MED ORDER — TRAZODONE HCL 100 MG PO TABS: 200.0000 mg | ORAL_TABLET | Freq: Every day | ORAL | 0 refills | Status: DC

## 2020-01-13 MED ORDER — ACETAMINOPHEN 325 MG PO TABS
650.0000 mg | ORAL_TABLET | Freq: Four times a day (QID) | ORAL | Status: DC | PRN
  Administered 2020-01-14 – 2020-01-18 (×4): 650 mg via ORAL
  Filled 2020-01-13 (×4): qty 2

## 2020-01-13 MED ORDER — TRAZODONE HCL 100 MG PO TABS
200.0000 mg | ORAL_TABLET | Freq: Every day | ORAL | Status: DC
  Administered 2020-01-13 – 2020-01-17 (×5): 200 mg via ORAL
  Filled 2020-01-13 (×5): qty 2

## 2020-01-13 MED ORDER — QUETIAPINE FUMARATE 300 MG PO TABS: 300.0000 mg | ORAL_TABLET | Freq: Every day | ORAL | Status: DC

## 2020-01-13 MED ORDER — METOPROLOL SUCCINATE ER 25 MG PO TB24
25.0000 mg | ORAL_TABLET | Freq: Every day | ORAL | Status: DC
  Administered 2020-01-13: 25 mg via ORAL
  Filled 2020-01-13: qty 1

## 2020-01-13 MED ORDER — AMLODIPINE BESYLATE 5 MG PO TABS
2.5000 mg | ORAL_TABLET | Freq: Every day | ORAL | Status: DC
  Administered 2020-01-14 – 2020-01-18 (×5): 2.5 mg via ORAL
  Filled 2020-01-13 (×5): qty 1

## 2020-01-13 MED ORDER — FLUOXETINE HCL 20 MG PO CAPS
40.0000 mg | ORAL_CAPSULE | Freq: Every day | ORAL | Status: DC
  Administered 2020-01-14: 40 mg via ORAL
  Filled 2020-01-13: qty 2

## 2020-01-13 MED ORDER — BUSPIRONE HCL 15 MG PO TABS
15.0000 mg | ORAL_TABLET | Freq: Two times a day (BID) | ORAL | Status: DC
  Administered 2020-01-13 (×2): 15 mg via ORAL
  Filled 2020-01-13 (×2): qty 1

## 2020-01-13 MED ORDER — QUETIAPINE FUMARATE 25 MG PO TABS
25.0000 mg | ORAL_TABLET | Freq: Two times a day (BID) | ORAL | Status: DC
  Administered 2020-01-14 – 2020-01-18 (×9): 25 mg via ORAL
  Filled 2020-01-13 (×9): qty 1

## 2020-01-13 MED ORDER — TRAZODONE HCL 100 MG PO TABS: 200.0000 mg | ORAL_TABLET | Freq: Every day | ORAL | Status: DC

## 2020-01-13 NOTE — ED Notes (Signed)
[facility] transport called to take pt to [facility] 

## 2020-01-13 NOTE — ED Notes (Addendum)
Patient alert and verbal. Patient denies has SI thoughts and depression. Patient given support and encouragement. Q 15 checks in progress and safety maintained. Monitoring continues.

## 2020-01-13 NOTE — ED Notes (Signed)
Patient requested for this writer to call her mother Curtis Sites [phone] and inform her of her being admitted to [facility]. Mother also given number to unit.

## 2020-01-13 NOTE — ED Notes (Signed)
Lunch given: Rice Krispies, apple sauce, nutrigrain bar.

## 2020-01-13 NOTE — ED Notes (Signed)
Transfers to [facility] pending COVID test results.

## 2020-01-13 NOTE — Telephone Encounter (Signed)
Marilyn Fowler called me yesterday evening on my cell and let me know they took Marilyn Fowler to [facility] ER first because Marilyn Fowler wanted to get Marilyn Fowler ankle pain checked first Marilyn Fowler had twisted it again. Which they did and also found out Marilyn Fowler has a UTI and given medication. Marilyn Fowler said they were going to Behavioral Health at that time but it looks like Marilyn Fowler is there today.   Marilyn Fowler did ask me about the phone call that was missed. I informed Marilyn Fowler Marilyn Fowler was aware and either Marilyn Fowler or myself would follow up with Marilyn Fowler.

## 2020-01-13 NOTE — ED Notes (Signed)
Pt. Blood pressure, pulse are high, NP Barbara Cower is informed,ordered for recheck.

## 2020-01-13 NOTE — ED Notes (Signed)
Pt blood rechecked, blood pressure and pulse are  still high, Jason unformed.

## 2020-01-13 NOTE — Discharge Instructions (Addendum)
Discharge to [facility]

## 2020-01-13 NOTE — ED Triage Notes (Signed)
Presents with suicidal ideations and depression over past few mos.
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
## 2020-01-13 NOTE — ED Notes (Addendum)
Report called to Joy at [facility] called to transport patient to [facility].

## 2020-01-13 NOTE — Telephone Encounter (Signed)
reviewed
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
## 2020-01-13 NOTE — ED Notes (Signed)
Breakfast given- cereal  

## 2020-01-13 NOTE — ED Provider Notes (Signed)
FBC/OBS ASAP Discharge Summary  Date and Time: 01/13/2020 1:37 PM  Name: Marilyn Fowler  MRN:  [id number]   Discharge Diagnoses:  Final diagnoses:  Bipolar II disorder, most recent episode major depressive (HCC)    Subjective: Patient reports today that she has not been sleeping well. She continues to endorse having suicidal ideations and not feeling safe to discharge home. She states that she has been on various medications to assist with sleep but is not sure what she can take anymore. She states that Marilyn PattenLunesta seemed to work extremely well for her but was unaffordable when she left the hospital. Patient states that she has been taking Seroquel XR but takes it about 30 minutes before going to bed after understanding the medication and feels that that may be the issue. She is agreement with changing her medication some. Her Seroquel XR was changed to Seroquel 300 mg nightly and 25 mg p.o. twice daily. Her Prozac was increased from 20 mg to 40 mg daily.  Stay Summary: Patient is a 44 year old female who presented with a history of bipolar 2 disorder twice in the emergency department with worsening depression, suicidal thoughts, urinary symptoms, and ankle injury. Patient was medically cleared and determined to have a UTI and was started on Keflex. [facility] for continuous observation and to restart medications. Patient continued to endorse suicidal ideations and not feeling safe for discharge home. Patient was [facility] for inpatient admission on the psychiatric unit.  Total Time spent with patient: 30 minutes  Past Psychiatric History: Bipolar 2 disorder, MDD, intentional overdose, suicide attempt hospitalizations and ED visits Past Medical History:  Past Medical History:  Diagnosis Date  . Alcohol abuse   . Anemia   . Anxiety   . Blood transfusion without reported diagnosis   . Depression   . Hemorrhoid   . Hypertension   . Migraine     Past Surgical History:   Procedure Laterality Date  . HEMORRHOID SURGERY    . HERNIA REPAIR    . TONSILLECTOMY    . VENTRAL HERNIA REPAIR     Family History:  Family History  Problem Relation Age of Onset  . Osteoarthritis Mother   . Diabetes Mother   . Hypertension Mother   . Depression Mother   . Hypertension Father   . Diabetes Father   . Asthma Son   . Deep vein thrombosis Maternal Grandmother   . Deep vein thrombosis Maternal Grandfather   . Stroke Neg Hx    Family Psychiatric History: Mother-depression Social History:  Social History   Substance and Sexual Activity  Alcohol Use No     Social History   Substance and Sexual Activity  Drug Use No    Social History   Socioeconomic History  . Marital status: Divorced    Spouse name: Not on file  . Number of children: 2  . Years of education: Not on file  . Highest education level: Not on file  Occupational History    Employer: [employer]  Tobacco Use  . Smoking status: Former Smoker    Years: 10.00  . Smokeless tobacco: Never Used  . Tobacco comment: smoked 1994-2012, up to 1 pp WEEK  Vaping Use  . Vaping Use: Never used  Substance and Sexual Activity  . Alcohol use: No  . Drug use: No  . Sexual activity: Not on file  Other Topics Concern  . Not on file  Social History Narrative   Separated,  not on BCP       Social Determinants of Health   Financial Resource Strain:   . Difficulty of Paying Living Expenses: Not on file  Food Insecurity:   . Worried About Programme researcher, broadcasting/film/video in the Last Year: Not on file  . Ran Out of Food in the Last Year: Not on file  Transportation Needs:   . Lack of Transportation (Medical): Not on file  . Lack of Transportation (Non-Medical): Not on file  Physical Activity:   . Days of Exercise per Week: Not on file  . Minutes of Exercise per Session: Not on file  Stress:   . Feeling of Stress : Not on file  Social Connections:   . Frequency of Communication with Friends and Family: Not  on file  . Frequency of Social Gatherings with Friends and Family: Not on file  . Attends Religious Services: Not on file  . Active Member of Clubs or Organizations: Not on file  . Attends Banker Meetings: Not on file  . Marital Status: Not on file   SDOH:  SDOH Screenings   Alcohol Screen:   . Last Alcohol Screening Score (AUDIT): Not on file  Depression (PHQ2-9):   . PHQ-2 Score: Not on file  Financial Resource Strain:   . Difficulty of Paying Living Expenses: Not on file  Food Insecurity:   . Worried About Programme researcher, broadcasting/film/video in the Last Year: Not on file  . Ran Out of Food in the Last Year: Not on file  Housing:   . Last Housing Risk Score: Not on file  Physical Activity:   . Days of Exercise per Week: Not on file  . Minutes of Exercise per Session: Not on file  Social Connections:   . Frequency of Communication with Friends and Family: Not on file  . Frequency of Social Gatherings with Friends and Family: Not on file  . Attends Religious Services: Not on file  . Active Member of Clubs or Organizations: Not on file  . Attends Banker Meetings: Not on file  . Marital Status: Not on file  Stress:   . Feeling of Stress : Not on file  Tobacco Use: Medium Risk  . Smoking Tobacco Use: Former Smoker  . Smokeless Tobacco Use: Never Used  Transportation Needs:   . Freight forwarder (Medical): Not on file  . Lack of Transportation (Non-Medical): Not on file    Has this patient used any form of tobacco in the last 30 days? (Cigarettes, Smokeless Tobacco, Cigars, and/or Pipes) A prescription for an FDA-approved tobacco cessation medication was offered at discharge and the patient refused  Current Medications:  Current Facility-Administered Medications  Medication Dose Route Frequency Provider Last Rate Last Admin  . acetaminophen (TYLENOL) tablet 650 mg  650 mg Oral Q6H PRN Nira Conn A, NP      . alum & mag hydroxide-simeth (MAALOX/MYLANTA)  200-200-20 MG/5ML suspension 30 mL  30 mL Oral Q4H PRN Nira Conn A, NP      . amLODipine (NORVASC) tablet 2.5 mg  2.5 mg Oral Daily Nira Conn A, NP   2.5 mg at 01/13/20 0920  . busPIRone (BUSPAR) tablet 15 mg  15 mg Oral BID Nira Conn A, NP   15 mg at 01/13/20 0919  . cephALEXin (KEFLEX) capsule 1,000 mg  1,000 mg Oral BID Nira Conn A, NP   1,000 mg at 01/13/20 0919  . FLUoxetine (PROZAC) capsule 20 mg  20 mg  Oral Once Joie Hipps, Gerlene Burdock, FNP      . [START ON 01/14/2020] FLUoxetine (PROZAC) capsule 40 mg  40 mg Oral Daily Rommie Dunn B, FNP      . magnesium hydroxide (MILK OF MAGNESIA) suspension 30 mL  30 mL Oral Daily PRN Nira Conn A, NP      . metoprolol succinate (TOPROL-XL) 24 hr tablet 25 mg  25 mg Oral Daily Nira Conn A, NP   25 mg at 01/13/20 0920  . phenazopyridine (PYRIDIUM) tablet 200 mg  200 mg Oral TID PRN Nira Conn A, NP      . QUEtiapine (SEROQUEL) tablet 25 mg  25 mg Oral BID Tuana Hoheisel, Gerlene Burdock, FNP      . QUEtiapine (SEROQUEL) tablet 300 mg  300 mg Oral QHS Niralya Ohanian B, FNP      . traZODone (DESYREL) tablet 200 mg  200 mg Oral QHS Jackelyn Poling, NP       Current Outpatient Medications  Medication Sig Dispense Refill  . amLODipine (NORVASC) 2.5 MG tablet TAKE 1 TABLET(2.5 MG) BY MOUTH DAILY FOR HIGH BLOOD PRESSURE (Patient taking differently: Take 2.5 mg by mouth daily. ) 30 tablet 6  . busPIRone (BUSPAR) 15 MG tablet Take 1 tablet (15 mg total) by mouth 2 (two) times daily. 60 tablet 2  . cephALEXin (KEFLEX) 500 MG capsule Take 2 capsules (1,000 mg total) by mouth 2 (two) times daily. 28 capsule 0  . [START ON 01/14/2020] FLUoxetine (PROZAC) 40 MG capsule Take 1 capsule (40 mg total) by mouth daily. 30 capsule 0  . metoprolol succinate (TOPROL-XL) 25 MG 24 hr tablet TAKE 1 TABLET(25 MG) BY MOUTH DAILY (Patient taking differently: Take 25 mg by mouth daily. ) 30 tablet 6  . phenazopyridine (PYRIDIUM) 200 MG tablet Take 1 tablet (200 mg total) by mouth 3 (three)  times daily as needed for pain. 6 tablet 0  . QUEtiapine (SEROQUEL) 25 MG tablet Take 1 tablet (25 mg total) by mouth 2 (two) times daily. 60 tablet 0  . QUEtiapine (SEROQUEL) 300 MG tablet Take 1 tablet (300 mg total) by mouth at bedtime. 30 tablet 0  . traZODone (DESYREL) 100 MG tablet Take 2 tablets (200 mg total) by mouth at bedtime. 60 tablet 0    PTA Medications: (Not in a hospital admission)   Musculoskeletal  Strength & Muscle Tone: within normal limits Gait & Station: normal Patient leans: N/A  Psychiatric Specialty Exam  Presentation  General Appearance: Appropriate for Environment;Casual  Eye Contact:Good  Speech:Clear and Coherent;Normal Rate  Speech Volume:Normal  Handedness:Right   Mood and Affect  Mood:Depressed  Affect:Depressed   Thought Process  Thought Processes:Coherent  Descriptions of Associations:Intact  Orientation:Full (Time, Place and Person)  Thought Content:WDL  Hallucinations:Hallucinations: None  Ideas of Reference:None  Suicidal Thoughts:Suicidal Thoughts: Yes, Active SI Active Intent and/or Plan: Without Intent  Homicidal Thoughts:Homicidal Thoughts: No   Sensorium  Memory:Immediate Good;Recent Good;Remote Good  Judgment:Intact  Insight:Fair   Executive Functions  Concentration:Good  Attention Span:Good  Recall:Good  Fund of Knowledge:Good  Language:Good   Psychomotor Activity  Psychomotor Activity:Psychomotor Activity: Normal   Assets  Assets:Communication Skills;Desire for Improvement;Housing;Social Support   Sleep  Sleep:Sleep: Fair   Physical Exam  Physical Exam Vitals and nursing note reviewed.  Constitutional:      Appearance: She is well-developed.  Cardiovascular:     Rate and Rhythm: Normal rate.  Pulmonary:     Effort: Pulmonary effort is normal.  Musculoskeletal:  General: Normal range of motion.  Neurological:     Mental Status: She is alert and oriented to person, place,  and time.    Review of Systems  Constitutional: Negative.   HENT: Negative.   Eyes: Negative.   Respiratory: Negative.   Cardiovascular: Negative.   Gastrointestinal: Negative.   Genitourinary: Negative.   Musculoskeletal: Negative.   Skin: Negative.   Neurological: Negative.   Endo/Heme/Allergies: Negative.   Psychiatric/Behavioral: Positive for depression and suicidal ideas. The patient is nervous/anxious and has insomnia.    Blood pressure (!) 140/100, pulse 84, temperature 98.4 F (36.9 C), temperature source Oral, resp. rate 18, height 5\' 5"  (1.651 m), weight 295 lb (133.8 kg), last menstrual period 12/14/2019, SpO2 97 %. Body mass index is 49.09 kg/m.   Disposition: Discharge to [facility] for inpatient admission  [facility], FNP 01/13/2020, 1:37 PM

## 2020-01-13 NOTE — ED Provider Notes (Signed)
Behavioral Health Admission H&P [facility]BS)  Date: 01/13/20 Patient Name: Marilyn Fowler MRN: [id number] Chief Complaint:  Chief Complaint  Patient presents with  . Urgent emergent eval      Diagnoses:  Final diagnoses:  Bipolar II disorder, most recent episode major depressive (HCC)    HPI: Marilyn Fowler is a 44 y.o. female with a history of Bipolar II Disorder who presented to [facility] with worsening depression, suicidal thoughts, urinary symptoms, and ankle injury.  After medical clearance and treatment, she was transferred to [facility] for evaluation.  Patient reports that she has been feeling increasingly depressed over the past 1-1/2 months.  She reports suicidal thoughts have become more frequent.  She denies any specific plan, but states that there are many ways to do it.  Patient reports that she overdosed on five clonazepam 09/2019 and was hospitalized at [facility].  She reports that she is currently taking buspirone, fluoxetine, quetiapine, and trazodone.  She feels that she needs medication adjustments.  She reports that she would like to start a new medication that she was taking at [facility] in July, but she could not afford it, she cannot recall the name of the medication.  On chart review it was noted that the patient was started on Risperdal during that hospitalization, but it was discontinued due to concern for weight gain.  She was then started on Abilify 5 mg daily, however, it appears her outpatient provider did not continue the Abilify.  Patient denies auditory and visual hallucinations.  No indication that she is responding to internal stimuli.  She denies use of illicit substances.  Urine drug screen was negative.  She states that she cannot contract for safety outside of the hospital and would like inpatient treatment.  PHQ 2-9:    Office Visit from 08/07/2017 in [facility] Office Visit from  04/15/2017 in [facility] Office Visit from 07/18/2016 in [facility]  Thoughts that you would be better off dead, or of hurting yourself in some way Not at all Not at all Not at all  PHQ-9 Total Score 0 0 0        ED from 01/12/2020 in [facility]  C-SSRS RISK CATEGORY Moderate Risk       Total Time spent with patient: 20 minutes  Musculoskeletal  Strength & Muscle Tone: within normal limits Gait & Station: normal Patient leans: N/A  Psychiatric Specialty Exam  Presentation General Appearance: Appropriate for Environment;Fairly Groomed  Eye Contact:Good  Speech:Clear and Coherent;Normal Rate  Speech Volume:Normal  Handedness:Right   Mood and Affect  Mood:Depressed;Anxious  Affect:Congruent;Depressed   Thought Process  Thought Processes:Coherent;Goal Directed;Linear  Descriptions of Associations:Intact  Orientation:Full (Time, Place and Person)  Thought Content:WDL  Hallucinations:Hallucinations: None  Ideas of Reference:None  Suicidal Thoughts:Suicidal Thoughts: Yes, Active SI Active Intent and/or Plan: Without Intent;Without Plan  Homicidal Thoughts:Homicidal Thoughts: No   Sensorium  Memory:Immediate Good;Recent Good;Remote Good  Judgment:Intact  Insight:Fair   Executive Functions  Concentration:Good  Attention Span:Good  Recall:Good  Fund of Knowledge:Good  Language:Good   Psychomotor Activity  Psychomotor Activity:Psychomotor Activity: Normal   Assets  Assets:Communication Skills;Desire for Improvement;Resilience   Sleep  Sleep:No data recorded  Physical Exam Constitutional:      General: She is not in acute distress.    Appearance: She is not ill-appearing, toxic-appearing or diaphoretic.  HENT:     Head: Normocephalic.  Right Ear: External ear normal.     Left Ear: External ear normal.  Eyes:      Conjunctiva/sclera: Conjunctivae normal.     Pupils: Pupils are equal, round, and reactive to light.  Cardiovascular:     Rate and Rhythm: Normal rate.  Pulmonary:     Effort: Pulmonary effort is normal. No respiratory distress.  Musculoskeletal:        General: Swelling and tenderness present.  Skin:    General: Skin is warm and dry.  Neurological:     Mental Status: She is alert and oriented to person, place, and time.  Psychiatric:        Mood and Affect: Mood is anxious and depressed.        Thought Content: Thought content is not paranoid or delusional. Thought content includes suicidal ideation. Thought content does not include homicidal ideation. Thought content includes suicidal plan.    Review of Systems  Constitutional: Negative for chills, diaphoresis, fever, malaise/fatigue and weight loss.  HENT: Negative for congestion.   Respiratory: Negative for cough and shortness of breath.   Cardiovascular: Negative for chest pain and palpitations.  Gastrointestinal: Negative for diarrhea, nausea and vomiting.  Musculoskeletal: Positive for joint pain.  Neurological: Negative for dizziness and seizures.  Psychiatric/Behavioral: Positive for depression and suicidal ideas. Negative for hallucinations, memory loss and substance abuse. The patient is nervous/anxious and has insomnia.   All other systems reviewed and are negative.   Blood pressure (!) 152/115, pulse (!) 106, temperature 97.7 F (36.5 C), temperature source Tympanic, resp. rate 20, height 5\' 5"  (1.651 m), weight 295 lb (133.8 kg), last menstrual period 12/14/2019, SpO2 98 %. Body mass index is 49.09 kg/m.  Past Psychiatric History: Bipolar II Disorder  Is the patient at risk to self? Yes  Has the patient been a risk to self in the past 6 months? Yes .    Has the patient been a risk to self within the distant past? Yes   Is the patient a risk to others? No   Has the patient been a risk to others in the past 6  months? No   Has the patient been a risk to others within the distant past? No   Past Medical History:  Past Medical History:  Diagnosis Date  . Alcohol abuse   . Anemia   . Anxiety   . Blood transfusion without reported diagnosis   . Depression   . Hemorrhoid   . Hypertension   . Migraine     Past Surgical History:  Procedure Laterality Date  . HEMORRHOID SURGERY    . HERNIA REPAIR    . TONSILLECTOMY    . VENTRAL HERNIA REPAIR      Family History:  Family History  Problem Relation Age of Onset  . Osteoarthritis Mother   . Diabetes Mother   . Hypertension Mother   . Depression Mother   . Hypertension Father   . Diabetes Father   . Asthma Son   . Deep vein thrombosis Maternal Grandmother   . Deep vein thrombosis Maternal Grandfather   . Stroke Neg Hx     Social History:  Social History   Socioeconomic History  . Marital status: Divorced    Spouse name: Not on file  . Number of children: 2  . Years of education: Not on file  . Highest education level: Not on file  Occupational History    Employer: [employer]  Tobacco Use  . Smoking status: Former  Smoker    Years: 10.00  . Smokeless tobacco: Never Used  . Tobacco comment: smoked 1994-2012, up to 1 pp WEEK  Vaping Use  . Vaping Use: Never used  Substance and Sexual Activity  . Alcohol use: No  . Drug use: No  . Sexual activity: Not on file  Other Topics Concern  . Not on file  Social History Narrative   Separated, not on BCP       Social Determinants of Health   Financial Resource Strain:   . Difficulty of Paying Living Expenses: Not on file  Food Insecurity:   . Worried About Programme researcher, broadcasting/film/video in the Last Year: Not on file  . Ran Out of Food in the Last Year: Not on file  Transportation Needs:   . Lack of Transportation (Medical): Not on file  . Lack of Transportation (Non-Medical): Not on file  Physical Activity:   . Days of Exercise per Week: Not on file  . Minutes of Exercise per  Session: Not on file  Stress:   . Feeling of Stress : Not on file  Social Connections:   . Frequency of Communication with Friends and Family: Not on file  . Frequency of Social Gatherings with Friends and Family: Not on file  . Attends Religious Services: Not on file  . Active Member of Clubs or Organizations: Not on file  . Attends Banker Meetings: Not on file  . Marital Status: Not on file  Intimate Partner Violence:   . Fear of Current or Ex-Partner: Not on file  . Emotionally Abused: Not on file  . Physically Abused: Not on file  . Sexually Abused: Not on file    SDOH:  SDOH Screenings   Alcohol Screen:   . Last Alcohol Screening Score (AUDIT): Not on file  Depression (PHQ2-9):   . PHQ-2 Score: Not on file  Financial Resource Strain:   . Difficulty of Paying Living Expenses: Not on file  Food Insecurity:   . Worried About Programme researcher, broadcasting/film/video in the Last Year: Not on file  . Ran Out of Food in the Last Year: Not on file  Housing:   . Last Housing Risk Score: Not on file  Physical Activity:   . Days of Exercise per Week: Not on file  . Minutes of Exercise per Session: Not on file  Social Connections:   . Frequency of Communication with Friends and Family: Not on file  . Frequency of Social Gatherings with Friends and Family: Not on file  . Attends Religious Services: Not on file  . Active Member of Clubs or Organizations: Not on file  . Attends Banker Meetings: Not on file  . Marital Status: Not on file  Stress:   . Feeling of Stress : Not on file  Tobacco Use: Medium Risk  . Smoking Tobacco Use: Former Smoker  . Smokeless Tobacco Use: Never Used  Transportation Needs:   . Freight forwarder (Medical): Not on file  . Lack of Transportation (Non-Medical): Not on file    Last Labs:  Admission on 01/12/2020, Discharged on 01/13/2020  Component Date Value Ref Range Status  . Color, Urine 01/12/2020 YELLOW  YELLOW Final  .  APPearance 01/12/2020 HAZY* CLEAR Final  . Specific Gravity, Urine 01/12/2020 1.018  1.005 - 1.030 Final  . pH 01/12/2020 6.0  5.0 - 8.0 Final  . Glucose, UA 01/12/2020 NEGATIVE  NEGATIVE mg/dL Final  . Hgb urine dipstick 01/12/2020 NEGATIVE  NEGATIVE Final  . Bilirubin Urine 01/12/2020 NEGATIVE  NEGATIVE Final  . Ketones, ur 01/12/2020 NEGATIVE  NEGATIVE mg/dL Final  . Protein, ur 73/71/0626 NEGATIVE  NEGATIVE mg/dL Final  . Nitrite 94/85/4627 NEGATIVE  NEGATIVE Final  . Glori Luis 01/12/2020 LARGE* NEGATIVE Final  . RBC / HPF 01/12/2020 0-5  0 - 5 RBC/hpf Final  . WBC, UA 01/12/2020 >50* 0 - 5 WBC/hpf Final  . Bacteria, UA 01/12/2020 RARE* NONE SEEN Final  . Squamous Epithelial / LPF 01/12/2020 0-5  0 - 5 Final  . Mucus 01/12/2020 PRESENT   Final   Performed at [facility], [address], [place], [place] [address]  . SARS Coronavirus 2 01/12/2020 NEGATIVE  NEGATIVE Final   Comment: (NOTE) SARS-CoV-2 target nucleic acids are NOT DETECTED.  The SARS-CoV-2 RNA is generally detectable in upper and lower respiratory specimens during the acute phase of infection. The lowest concentration of SARS-CoV-2 viral copies this assay can detect is 250 copies / mL. A negative result does not preclude SARS-CoV-2 infection and should not be used as the sole basis for treatment or other patient management decisions.  A negative result may occur with improper specimen collection / handling, submission of specimen other than nasopharyngeal swab, presence of viral mutation(s) within the areas targeted by this assay, and inadequate number of viral copies (<250 copies / mL). A negative result must be combined with clinical observations, patient history, and epidemiological information.  Fact Sheet for Patients:   BoilerBrush.com.cy  Fact Sheet for Healthcare Providers: https://pope.com/  This test is not yet approved or                            cleared by the [place] FDA and has been authorized for detection and/or diagnosis of SARS-CoV-2 by FDA under an Emergency Use Authorization (EUA).  This EUA will remain in effect (meaning this test can be used) for the duration of the COVID-19 declaration under Section 564(b)(1) of the Act, 21 U.S.C. section 360bbb-3(b)(1), unless the authorization is terminated or revoked sooner.  Performed at [facility], [address], [place], [place] [address]   . Sodium 01/12/2020 138  135 - 145 mmol/L Final  . Potassium 01/12/2020 4.1  3.5 - 5.1 mmol/L Final  . Chloride 01/12/2020 105  98 - 111 mmol/L Final  . CO2 01/12/2020 23  22 - 32 mmol/L Final  . Glucose, Bld 01/12/2020 93  70 - 99 mg/dL Final   Glucose reference range applies only to samples taken after fasting for at least 8 hours.  . BUN 01/12/2020 10  6 - 20 mg/dL Final  . Creatinine, Ser 01/12/2020 0.76  0.44 - 1.00 mg/dL Final  . Calcium 29/93/7169 8.7* 8.9 - 10.3 mg/dL Final  . Total Protein 01/12/2020 6.8  6.5 - 8.1 g/dL Final  . Albumin 67/89/3810 3.7  3.5 - 5.0 g/dL Final  . AST 17/51/0258 20  15 - 41 U/L Final  . ALT 01/12/2020 15  0 - 44 U/L Final  . Alkaline Phosphatase 01/12/2020 81  38 - 126 U/L Final  . Total Bilirubin 01/12/2020 0.5  0.3 - 1.2 mg/dL Final  . GFR calc non Af Amer 01/12/2020 >60  >60 mL/min Final  . GFR calc Af Amer 01/12/2020 >60  >60 mL/min Final  . Anion gap 01/12/2020 10  5 - 15 Final   Performed at [facility], [address] [place],  [place] [address]  . Alcohol, Ethyl (B) 01/12/2020 <10  <10 mg/dL Final   Comment: (NOTE) Lowest detectable limit for serum alcohol is 10 mg/dL.  For medical purposes only. Performed at [facility], [address], [place], [place] [address]   . Opiates 01/12/2020 NONE DETECTED  NONE DETECTED Final  . Cocaine 01/12/2020 NONE DETECTED  NONE DETECTED Final  . Benzodiazepines  01/12/2020 NONE DETECTED  NONE DETECTED Final  . Amphetamines 01/12/2020 NONE DETECTED  NONE DETECTED Final  . Tetrahydrocannabinol 01/12/2020 NONE DETECTED  NONE DETECTED Final  . Barbiturates 01/12/2020 NONE DETECTED  NONE DETECTED Final   Comment: (NOTE) DRUG SCREEN FOR MEDICAL PURPOSES ONLY.  IF CONFIRMATION IS NEEDED FOR ANY PURPOSE, NOTIFY LAB WITHIN 5 DAYS.  LOWEST DETECTABLE LIMITS FOR URINE DRUG SCREEN Drug Class                     Cutoff (ng/mL) Amphetamine and metabolites    1000 Barbiturate and metabolites    200 Benzodiazepine                 200 Tricyclics and metabolites     300 Opiates and metabolites        300 Cocaine and metabolites        300 THC                            50 Performed at [facility], [address], [place], [place] [address]   . WBC 01/12/2020 11.2* 4.0 - 10.5 K/uL Final  . RBC 01/12/2020 4.45  3.87 - 5.11 MIL/uL Final  . Hemoglobin 01/12/2020 10.4* 12.0 - 15.0 g/dL Final  . HCT 60/07/5995 34.8* 36 - 46 % Final  . MCV 01/12/2020 78.2* 80.0 - 100.0 fL Final  . MCH 01/12/2020 23.4* 26.0 - 34.0 pg Final  . MCHC 01/12/2020 29.9* 30.0 - 36.0 g/dL Final  . RDW 74/14/2395 18.2* 11.5 - 15.5 % Final  . Platelets 01/12/2020 371  150 - 400 K/uL Final  . nRBC 01/12/2020 0.0  0.0 - 0.2 % Final  . Neutrophils Relative % 01/12/2020 59  % Final  . Neutro Abs 01/12/2020 6.6  1.7 - 7.7 K/uL Final  . Lymphocytes Relative 01/12/2020 30  % Final  . Lymphs Abs 01/12/2020 3.3  0.7 - 4.0 K/uL Final  . Monocytes Relative 01/12/2020 9  % Final  . Monocytes Absolute 01/12/2020 1.0  0 - 1 K/uL Final  . Eosinophils Relative 01/12/2020 2  % Final  . Eosinophils Absolute 01/12/2020 0.2  0 - 0 K/uL Final  . Basophils Relative 01/12/2020 0  % Final  . Basophils Absolute 01/12/2020 0.0  0 - 0 K/uL Final  . Immature Granulocytes 01/12/2020 0  % Final  . Abs Immature Granulocytes 01/12/2020 0.04  0.00 - 0.07 K/uL Final   Performed at [facility] [address] [place], [place] [address]  . I-stat hCG, quantitative 01/12/2020 <5.0  <5 mIU/mL Final  . Comment 3 01/12/2020          Final   Comment:   GEST. AGE      CONC.  (mIU/mL)   <=1 WEEK        5 - 50     2 WEEKS       50 - 500     3 WEEKS       100 - 10,000  4 WEEKS     1,000 - 30,000        FEMALE AND NON-PREGNANT FEMALE:     LESS THAN 5 mIU/mL   . Acetaminophen (Tylenol), Serum 01/12/2020 <10* 10 - 30 ug/mL Final   Comment: (NOTE) Therapeutic concentrations vary significantly. A range of 10-30 ug/mL  may be an effective concentration for many patients. However, some  are best treated at concentrations outside of this range. Acetaminophen concentrations >150 ug/mL at 4 hours after ingestion  and >50 ug/mL at 12 hours after ingestion are often associated with  toxic reactions.  Performed at [facility], [address] [place], [place] [address]   . Salicylate Lvl 01/12/2020 <7.0* 7.0 - 30.0 mg/dL Final   Performed at [facility], [address] [place], [place] [address]    Allergies: Methocarbamol and Toradol [ketorolac tromethamine]  Prior to Admission medications   Medication Sig Start Date End Date Taking? Authorizing Provider  amLODipine (NORVASC) 2.5 MG tablet TAKE 1 TABLET(2.5 MG) BY MOUTH DAILY FOR HIGH BLOOD PRESSURE Patient taking differently: Take 2.5 mg by mouth daily.  10/19/18   Sheliah Hatch, MD  busPIRone (BUSPAR) 15 MG tablet Take 1 tablet (15 mg total) by mouth 2 (two) times daily. 11/19/19   Melony Overly T, PA-C  cephALEXin (KEFLEX) 500 MG capsule Take 2 capsules (1,000 mg total) by mouth 2 (two) times daily. 01/12/20   Arby Barrette, MD  cyclobenzaprine (FLEXERIL) 10 MG tablet Take 1 tablet (10 mg total) by mouth 2 (two) times daily as needed for muscle spasms. Patient not taking: Reported on 10/27/2019 05/26/19   Renne Crigler, PA-C  FLUoxetine (PROZAC) 20 MG capsule Take 1  capsule (20 mg total) by mouth daily. 11/19/19   Melony Overly T, PA-C  metoprolol succinate (TOPROL-XL) 25 MG 24 hr tablet TAKE 1 TABLET(25 MG) BY MOUTH DAILY Patient taking differently: Take 25 mg by mouth daily.  02/08/19   Sheliah Hatch, MD  phenazopyridine (PYRIDIUM) 200 MG tablet Take 1 tablet (200 mg total) by mouth 3 (three) times daily as needed for pain. 01/12/20   Arby Barrette, MD  QUEtiapine (SEROQUEL XR) 300 MG 24 hr tablet Take 1 tablet (300 mg total) by mouth at bedtime. 11/19/19   Melony Overly T, PA-C  traZODone (DESYREL) 100 MG tablet TAKE 1 TO 2 TABLETS(100 TO 200 MG) BY MOUTH AT BEDTIME AS NEEDED FOR SLEEP Patient taking differently: Take 200 mg by mouth at bedtime.  10/08/19   Cherie Ouch, PA-C    Medical Decision Making  Patient was medically cleared in the emergency department Patient was provided an ACE bandage and CAM boot for her right ankle/foot in the ED Patient was started on Keflex for UTI while in the ED.  Continue home medications Buspirone 15 mg twice daily for anxiety Fluoxetine 20 mg daily for depression Quetiapine XR 300 mg nightly for mood stability Trazodone 200 mg nightly for sleep Metoprolol XL 25 mg daily for hypertension Amlodipine 2.5 mg daily for hypertension Cephalexin 1000 mg twice daily for UTI Phenazopyridine 200 mg 3 times daily as needed for urinary discomfort      Recommendations  Based on my evaluation the patient does not appear to have an emergency medical condition.   Patient will be placed in the continuous assessment area at [facility] for treatment and stabilization. She will be reevaluated on 01/13/2020. The treatment team will determine disposition at that time.      Jackelyn Poling,  NP 01/13/20  2:59 AM

## 2020-01-13 NOTE — ED Notes (Signed)
Nurse Discharge Note:  D:Patient denies SI/HI/AVH at this time. Pt appears calm and cooperative, and no distress noted.  A: All Personal items in locker returned to pt. Pt aware of admission to [facility] and voices no concerns.  Pt escorted off unit to meet Safe Transport driver.  R:  Pt voices no concerns or questions about admission.

## 2020-01-13 NOTE — Telephone Encounter (Signed)
noted 

## 2020-01-13 NOTE — BH Assessment (Signed)
Patient has been accepted to [facility].  Accepting physician is Dr. Toni Amend.  Attending  Physician will be Dr. Toni Amend.  Patient has been assigned to room 319, by [facility] Charge Nurse Gigi.   Call report to [phone].  Representative/Transfer Coordinator is Warden/ranger Patient pre-admitted by [facility] Patient Access [facility])  [facility] Staff (Jolan, TTS) made aware of acceptance.

## 2020-01-13 NOTE — Progress Notes (Signed)
Admission Note:   Pt admitted to [facility] unit on a voluntary. Pt is alert and oriented to person, place, time and situation. Pt arrived from [facility] in safe transport ride, received report from [place], [place]. Pt is calm, cooperative, pleasant, reports she stopped taking her medications, namely Prozac and became very depressed over the last couple of months, reports she began having suicidal ideation without plan that she voiced to her mother, pt's mother took pt to the ED for evaluation. Pt denies suicidal ideation at this time. Pt's medical history is a current UTI, obesity, history of Bipolar II disorder, a suicide attempt back in June 2021 by overdose on clonazepam, was Hospitalized at [facility] for the OD, currently has a sprained right ankle with pain, pt is wearing a wrap and boot causing an abnormal gait walking on her medical Velcro boot. Pt is therefore a high fall risk. Pt denies any history of falls. Pt reports acute pain on the right ankle, requested and was ordered Vicodin for pain which was what pt was getting at [facility] as well earlier today. Pt reports a history of alcohol abuse, but has not had a drink in 3 months. Pt UDS is negative, Covid negative. Skin assessment completed with second nurse as witness. Pt was given unit orientation, no distress noted, none reported, pt given dinner, appetite is good. Will continue to monitor pt per Q15 minute face checks and monitor for safety and progress.

## 2020-01-13 NOTE — ED Notes (Signed)
Pt A&O x 4, presents with SI, no plan and Depression all her life she reports.  Denies HI or AVH.  Currently being treated for rt ankle sprain/walking boot and UTI.  Skin search completed.  Monitoring for safety. Pt calm & cooperative, no acute distress noted.

## 2020-01-13 NOTE — ED Notes (Signed)
Pt belongings are stored in locker#02

## 2020-01-14 DIAGNOSIS — S93409A Sprain of unspecified ligament of unspecified ankle, initial encounter: Secondary | ICD-10-CM

## 2020-01-14 DIAGNOSIS — F319 Bipolar disorder, unspecified: Secondary | ICD-10-CM

## 2020-01-14 LAB — URINE CULTURE: Culture: >=100000 — AB

## 2020-01-14 MED ORDER — IBUPROFEN 600 MG PO TABS
600.0000 mg | ORAL_TABLET | Freq: Four times a day (QID) | ORAL | Status: DC | PRN
  Administered 2020-01-14 – 2020-01-17 (×8): 600 mg via ORAL
  Filled 2020-01-14 (×8): qty 1

## 2020-01-14 MED ORDER — ONDANSETRON HCL 4 MG PO TABS
4.0000 mg | ORAL_TABLET | Freq: Three times a day (TID) | ORAL | Status: DC | PRN
  Administered 2020-01-14 – 2020-01-17 (×3): 4 mg via ORAL
  Filled 2020-01-14 (×3): qty 1

## 2020-01-14 MED ORDER — BUPROPION HCL ER (XL) 150 MG PO TB24
300.0000 mg | ORAL_TABLET | Freq: Every day | ORAL | Status: DC
  Administered 2020-01-14 – 2020-01-18 (×5): 300 mg via ORAL
  Filled 2020-01-14 (×5): qty 2

## 2020-01-14 NOTE — Progress Notes (Signed)
D: Pt alert and oriented. Pt rates depression 6/10, hopelessness 7/10, and anxiety 7/10.Pt goal: "to feel less depressed and more hopeful." Pt reports energy level as normal and concentration as being poor. Pt reports sleep last night as being fair. Pt did receive medications for sleep and did find them helpful. Pt reports experiencing 4/10 right ankle pain, prn meds given. Pt denies experiencing any SI/HI, or AVH at this time.   A: Scheduled medications administered to pt, per MD orders. Support and encouragement provided. Frequent verbal contact made. Routine safety checks conducted q15 minutes.   R: No adverse drug reactions noted. Pt verbally contracts for safety at this time. Pt complaint with medications. Pt interacts well with others on the unit minimally, pt mostly stays in her room. Pt remains safe at this time. Will continue to monitor.
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
## 2020-01-14 NOTE — Progress Notes (Signed)
Recreation Therapy Notes   Date: 01/14/2020  Time: 9:30 am   Location: Craft room     Behavioral response: N/A   Intervention Topic: Self-Care   Discussion/Intervention: Patient did not attend group.   Clinical Observations/Feedback:  Patient did not attend group.   Stephens Shreve LRT/CTRS        Marilyn Fowler 01/14/2020 11:23 AM 

## 2020-01-14 NOTE — [facility] Suicide Risk Assessment (Signed)
[facility] Admission Suicide Risk Assessment   Nursing information obtained from:  Patient Demographic factors:  Caucasian Current Mental Status:  NA Loss Factors:  NA Historical Factors:  Prior suicide attempts Risk Reduction Factors:  Positive social support  Total Time spent with patient: 1 hour Principal Problem: Bipolar depression (HCC) Diagnosis:  Principal Problem:   Bipolar depression (HCC) Active Problems:   HTN (hypertension)   Gastroenteritis   Sprained ankle  Subjective Data: Patient seen chart reviewed.  44 year old man with a history of recurrent bipolar depression came to the hospital saying her depression is worse and she was having some suicidal thoughts.  Patient is currently cooperative with treatment and showing good insight.  Denies intention to hurt her self in the hospital.  Continued Clinical Symptoms:  Alcohol Use Disorder Identification Test Final Score (AUDIT): 0 The "Alcohol Use Disorders Identification Test", Guidelines for Use in Primary Care, Second Edition.  World Science writer [facility]). Score between 0-7:  no or low risk or alcohol related problems. Score between 8-15:  moderate risk of alcohol related problems. Score between 16-19:  high risk of alcohol related problems. Score 20 or above:  warrants further diagnostic evaluation for alcohol dependence and treatment.   CLINICAL FACTORS:   Bipolar Disorder:   Depressive phase   Musculoskeletal: Strength & Muscle Tone: within normal limits Gait & Station: normal Patient leans: N/A  Psychiatric Specialty Exam: Physical Exam Vitals and nursing note reviewed.  Constitutional:      Appearance: She is well-developed.  HENT:     Head: Normocephalic and atraumatic.  Eyes:     Conjunctiva/sclera: Conjunctivae normal.     Pupils: Pupils are equal, round, and reactive to light.  Cardiovascular:     Heart sounds: Normal heart sounds.  Pulmonary:     Effort: Pulmonary effort is normal.  Abdominal:      Palpations: Abdomen is soft.  Musculoskeletal:        General: Normal range of motion.     Cervical back: Normal range of motion.  Skin:    General: Skin is warm and dry.  Neurological:     General: No focal deficit present.     Mental Status: She is alert.  Psychiatric:        Attention and Perception: Attention normal.        Mood and Affect: Mood is anxious and depressed.        Speech: Speech normal.        Behavior: Behavior is cooperative.        Thought Content: Thought content includes suicidal ideation. Thought content does not include suicidal plan.        Cognition and Memory: Cognition is impaired.        Judgment: Judgment is impulsive.     Review of Systems  Constitutional: Negative.   HENT: Negative.   Eyes: Negative.   Respiratory: Negative.   Cardiovascular: Negative.   Gastrointestinal: Negative.   Musculoskeletal: Negative.   Skin: Negative.   Neurological: Negative.   Psychiatric/Behavioral: Positive for dysphoric mood and suicidal ideas. The patient is nervous/anxious.     Blood pressure (!) 153/106, pulse 97, temperature 97.9 F (36.6 C), temperature source Oral, resp. rate 17, height 5' (1.524 m), weight 131.5 kg, SpO2 97 %.Body mass index is 56.64 kg/m.  General Appearance: Casual  Eye Contact:  Good  Speech:  Clear and Coherent  Volume:  Normal  Mood:  Depressed  Affect:  Congruent  Thought Process:  Coherent  Orientation:  Full (Time, Place, and Person)  Thought Content:  Logical  Suicidal Thoughts:  Yes.  without intent/plan  Homicidal Thoughts:  No  Memory:  Immediate;   Fair Recent;   Fair Remote;   Fair  Judgement:  Fair  Insight:  Fair  Psychomotor Activity:  Decreased  Concentration:  Concentration: Fair  Recall:  [employer] of Knowledge:  Fair  Language:  Fair  Akathisia:  No  Handed:  Right  AIMS (if indicated):     Assets:  Desire for Improvement Housing Resilience Social Support  ADL's:  Intact  Cognition:  WNL   Sleep:  Number of Hours: 7.5      COGNITIVE FEATURES THAT CONTRIBUTE TO RISK:  None    SUICIDE RISK:   Mild:  Suicidal ideation of limited frequency, intensity, duration, and specificity.  There are no identifiable plans, no associated intent, mild dysphoria and related symptoms, good self-control (both objective and subjective assessment), few other risk factors, and identifiable protective factors, including available and accessible social support.  PLAN OF CARE: Continue 15-minute checks.  Adjust medication and consultation with the patient.  Engage in individual and group therapy.  Full treatment team assessment.  Assure appropriate outpatient treatment at discharge  I certify that inpatient services furnished can reasonably be expected to improve the patient's condition.   Mordecai Rasmussen, MD 01/14/2020, 3:58 PM
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
## 2020-01-14 NOTE — Progress Notes (Signed)
Recreation Therapy Notes  INPATIENT RECREATION THERAPY ASSESSMENT  Patient Details Name: Marilyn Fowler MRN: [id number] DOB: Jun 30, 1975 Today's Date: 01/14/2020       Information Obtained From: Patient  Able to Participate in Assessment/Interview: Yes  Patient Presentation: Responsive  Reason for Admission (Per Patient): Active Symptoms  Patient Stressors:    Coping Skills:    (None)  Leisure Interests (2+):   (None)  Frequency of Recreation/Participation:    Awareness of Community Resources:     [employer]:     Current Use:    If no, Barriers?:    Expressed Interest in [employer] Information:    [place] of Residence:  [place]  Patient Main Form of Transportation: Set designer  Patient Strengths:  Communication  Patient Identified Areas of Improvement:  My depression  Patient Goal for Hospitalization:  Feel better about self  Current SI (including self-harm):  No  Current HI:  No  Current AVH: No  Staff Intervention Plan: Group Attendance, Collaborate with Interdisciplinary Treatment Team  Consent to Intern Participation: N/A  Tatisha Cerino 01/14/2020, 2:01 PM

## 2020-01-15 ENCOUNTER — Telehealth (HOSPITAL_BASED_OUTPATIENT_CLINIC_OR_DEPARTMENT_OTHER): Payer: Self-pay | Admitting: Emergency Medicine

## 2020-01-15 MED ORDER — HYDROXYZINE HCL 25 MG PO TABS
25.0000 mg | ORAL_TABLET | ORAL | Status: DC | PRN
  Administered 2020-01-15 – 2020-01-18 (×9): 25 mg via ORAL
  Filled 2020-01-15 (×9): qty 1

## 2020-01-15 MED ORDER — NAPROXEN 375 MG PO TABS
375.0000 mg | ORAL_TABLET | Freq: Two times a day (BID) | ORAL | Status: DC | PRN
  Administered 2020-01-15: 375 mg via ORAL
  Filled 2020-01-15 (×2): qty 1

## 2020-01-15 NOTE — Progress Notes (Signed)
Patient alert and oriented x 4, affect is flat, she brightens upon approach, her thoughts are organized and coherent, she denies SI/HI/AVH .Patient isolates to self except during snacks and medication distribution, she has her ace wrap intact to right leg, her gait appears unsteady, she c/o of leg pain intermittently. Patient was offered emotional support and encouraged to attend evening wrap up group. Patient did not attend group, she was complaint with evening medication regimen. 15 minutes safety checks maintained will continue to monitor.

## 2020-01-15 NOTE — Tx Team (Signed)
Interdisciplinary Treatment and Diagnostic Plan Update  01/15/2020 Time of Session: 9:45 AM  Marilyn Fowler MRN: [id number]  Principal Diagnosis: Bipolar depression ([place])  Secondary Diagnoses: Principal Problem:   Bipolar depression ([place]) Active Problems:   HTN (hypertension)   Gastroenteritis   Sprained ankle   Current Medications:  Current Facility-Administered Medications  Medication Dose Route Frequency Provider Last Rate Last Admin  . acetaminophen (TYLENOL) tablet 650 mg  650 mg Oral Q6H PRN Clapacs, Madie Reno, MD   650 mg at 01/15/20 0820  . alum & mag hydroxide-simeth (MAALOX/MYLANTA) 200-200-20 MG/5ML suspension 30 mL  30 mL Oral Q4H PRN Clapacs, John T, MD      . amLODipine (NORVASC) tablet 2.5 mg  2.5 mg Oral Daily Clapacs, John T, MD   2.5 mg at 01/15/20 0816  . buPROPion (WELLBUTRIN XL) 24 hr tablet 300 mg  300 mg Oral Daily Clapacs, Madie Reno, MD   300 mg at 01/15/20 0816  . cephALEXin (KEFLEX) capsule 1,000 mg  1,000 mg Oral BID Clapacs, John T, MD   1,000 mg at 01/15/20 1713  . hydrOXYzine (ATARAX/VISTARIL) tablet 25 mg  25 mg Oral Q4H PRN Larita Fife, MD   25 mg at 01/15/20 1406  . ibuprofen (ADVIL) tablet 600 mg  600 mg Oral Q6H PRN Clapacs, Madie Reno, MD   600 mg at 01/15/20 1233  . magnesium hydroxide (MILK OF MAGNESIA) suspension 30 mL  30 mL Oral Daily PRN Clapacs, John T, MD      . metoprolol succinate (TOPROL-XL) 24 hr tablet 25 mg  25 mg Oral Daily Clapacs, John T, MD   25 mg at 01/15/20 0816  . naproxen (NAPROSYN) tablet 375 mg  375 mg Oral BID PRN Larita Fife, MD   375 mg at 01/15/20 1712  . ondansetron (ZOFRAN) tablet 4 mg  4 mg Oral Q8H PRN Clapacs, Madie Reno, MD   4 mg at 01/14/20 0927  . phenazopyridine (PYRIDIUM) tablet 200 mg  200 mg Oral TID PRN Clapacs, John T, MD      . QUEtiapine (SEROQUEL) tablet 25 mg  25 mg Oral BID Clapacs, Madie Reno, MD   25 mg at 01/15/20 1713  . QUEtiapine (SEROQUEL) tablet 300 mg  300 mg Oral QHS Clapacs, John T, MD   300 mg at 01/14/20  2205  . traZODone (DESYREL) tablet 200 mg  200 mg Oral QHS Clapacs, John T, MD   200 mg at 01/14/20 2204   PTA Medications: Medications Prior to Admission  Medication Sig Dispense Refill Last Dose  . amLODipine (NORVASC) 2.5 MG tablet TAKE 1 TABLET(2.5 MG) BY MOUTH DAILY FOR HIGH BLOOD PRESSURE (Patient taking differently: Take 2.5 mg by mouth daily. ) 30 tablet 6   . busPIRone (BUSPAR) 15 MG tablet Take 1 tablet (15 mg total) by mouth 2 (two) times daily. 60 tablet 2   . cephALEXin (KEFLEX) 500 MG capsule Take 2 capsules (1,000 mg total) by mouth 2 (two) times daily. 28 capsule 0   . FLUoxetine (PROZAC) 40 MG capsule Take 1 capsule (40 mg total) by mouth daily. 30 capsule 0   . metoprolol succinate (TOPROL-XL) 25 MG 24 hr tablet TAKE 1 TABLET(25 MG) BY MOUTH DAILY (Patient taking differently: Take 25 mg by mouth daily. ) 30 tablet 6   . phenazopyridine (PYRIDIUM) 200 MG tablet Take 1 tablet (200 mg total) by mouth 3 (three) times daily as needed for pain. 6 tablet 0   . QUEtiapine (SEROQUEL) 25 MG tablet  Take 1 tablet (25 mg total) by mouth 2 (two) times daily. 60 tablet 0   . QUEtiapine (SEROQUEL) 300 MG tablet Take 1 tablet (300 mg total) by mouth at bedtime. 30 tablet 0   . traZODone (DESYREL) 100 MG tablet Take 2 tablets (200 mg total) by mouth at bedtime. 60 tablet 0     Patient Stressors:    Patient Strengths:    Treatment Modalities: Medication Management, Group therapy, Case management,  1 to 1 session with clinician, Psychoeducation, Recreational therapy.   Physician Treatment Plan for Primary Diagnosis: Bipolar depression ([place]) Long Term Goal(s): Improvement in symptoms so as ready for discharge Improvement in symptoms so as ready for discharge   Short Term Goals: Ability to disclose and discuss suicidal ideas Ability to demonstrate self-control will improve Ability to maintain clinical measurements within normal limits will improve Compliance with prescribed medications  will improve  Medication Management: Evaluate patient's response, side effects, and tolerance of medication regimen.  Therapeutic Interventions: 1 to 1 sessions, Unit Group sessions and Medication administration.  Evaluation of Outcomes: Not Met  Physician Treatment Plan for Secondary Diagnosis: Principal Problem:   Bipolar depression ([place]) Active Problems:   HTN (hypertension)   Gastroenteritis   Sprained ankle  Long Term Goal(s): Improvement in symptoms so as ready for discharge Improvement in symptoms so as ready for discharge   Short Term Goals: Ability to disclose and discuss suicidal ideas Ability to demonstrate self-control will improve Ability to maintain clinical measurements within normal limits will improve Compliance with prescribed medications will improve     Medication Management: Evaluate patient's response, side effects, and tolerance of medication regimen.  Therapeutic Interventions: 1 to 1 sessions, Unit Group sessions and Medication administration.  Evaluation of Outcomes: Not Met   RN Treatment Plan for Primary Diagnosis: Bipolar depression ([place]) Long Term Goal(s): Knowledge of disease and therapeutic regimen to maintain health will improve  Short Term Goals: Ability to demonstrate self-control, Ability to participate in decision making will improve, Ability to verbalize feelings will improve, Ability to disclose and discuss suicidal ideas, Ability to identify and develop effective coping behaviors will improve and Compliance with prescribed medications will improve  Medication Management: RN will administer medications as ordered by provider, will assess and evaluate patient's response and provide education to patient for prescribed medication. RN will report any adverse and/or side effects to prescribing provider.  Therapeutic Interventions: 1 on 1 counseling sessions, Psychoeducation, Medication administration, Evaluate responses to treatment, Monitor  vital signs and CBGs as ordered, Perform/monitor CIWA, COWS, AIMS and Fall Risk screenings as ordered, Perform wound care treatments as ordered.  Evaluation of Outcomes: Not Met   LCSW Treatment Plan for Primary Diagnosis: Bipolar depression ([place]) Long Term Goal(s): Safe transition to appropriate next level of care at discharge, Engage patient in therapeutic group addressing interpersonal concerns.  Short Term Goals: Engage patient in aftercare planning with referrals and resources, Increase social support, Increase ability to appropriately verbalize feelings, Increase emotional regulation and Increase skills for wellness and recovery  Therapeutic Interventions: Assess for all discharge needs, 1 to 1 time with Social worker, Explore available resources and support systems, Assess for adequacy in community support network, Educate family and significant other(s) on suicide prevention, Complete Psychosocial Assessment, Interpersonal group therapy.  Evaluation of Outcomes: Not Met   Progress in Treatment: Attending groups: No. Participating in groups: No. Taking medication as prescribed: Yes. Toleration medication: Yes. Family/Significant other contact made: No, will contact:  Patient stated she would like to  think about providing a collateral  Patient understands diagnosis: Yes. Discussing patient identified problems/goals with staff: Yes. Medical problems stabilized or resolved: Yes. Denies suicidal/homicidal ideation: Yes. Issues/concerns per patient self-inventory: No. Other: None   New problem(s) identified: No, Describe:  None   New Short Term/Long Term Goal(s): E  Patient Goals:  Patient stated that she would like to get back on the right medications   Discharge Plan or Barriers: CSW will speak with patient about an appropriate discharge plan. [facility], medication management, and [facility] are possibly options for outpatient services.   Reason for Continuation of Hospitalization:  Medication stabilization Suicidal ideation  Estimated Length of Stay: TBD  Attendees: Patient: Marilyn Fowler  01/15/2020 5:28 PM  Physician: Dr. Danella Sensing, MD  01/15/2020 5:28 PM  Nursing:  01/15/2020 5:28 PM  RN Care Manager: 01/15/2020 5:28 PM  Social Worker: Raina Mina, [place]  01/15/2020 5:28 PM  Recreational Therapist:  01/15/2020 5:28 PM  Other:  01/15/2020 5:28 PM  Other:  01/15/2020 5:28 PM  Other: 01/15/2020 5:28 PM    Scribe for Treatment Team: Raina Mina, [place] 01/15/2020 5:28 PM

## 2020-01-15 NOTE — [facility] Group Notes (Signed)
[facility] Group Notes:  (Nursing/MHT/Case Management/Adjunct)  Date:  01/15/2020  Time:  11:10 PM  Type of Therapy:  Group Therapy  Participation Level:  Did Not Attend   Mayra Neer 01/15/2020, 11:10 PM

## 2020-01-15 NOTE — Telephone Encounter (Signed)
Post ED Visit - Positive Culture Follow-up  Culture report reviewed by antimicrobial stewardship pharmacist: [facility] []  , Pharm.D. []  Enzo Bi, .D., BCPS AQ-ID []  Celedonio Miyamoto, Pharm.D., BCPS []  [address], Pharm.D., BCPS []  [place], Garvin Fila.D., BCPS, AAHIVP []  , Pharm.D., BCPS, AAHIVP []  Georgina Pillion, PharmD, BCPS []  , PharmD, BCPS []  [place], PharmD, BCPS []  [place], PharmD []  , PharmD, BCPS []  Estella Husk, PharmD  Pharmacy Team []  Lysle Pearl, PharmD []  , PharmD []  Phillips Climes, PharmD [x]  , Rph []  Agapito Games) , PharmD []  Verlan Friends, PharmD []  , PharmD []  Mervyn Gay, PharmD []  , PharmD []  Vinnie Level, PharmD []  [facility], PharmD []  , PharmD []  Len Childs, PharmD   Positive urine culture Treated with Cephalexin, organism sensitive to the same and no further patient follow-up is required at this time.  [name] 01/15/2020, 4:27 PM

## 2020-01-15 NOTE — Progress Notes (Addendum)
[facility] MD Progress Note  01/15/2020 1:50 PM Marilyn Fowler  MRN:  [id number]  Principal Problem: Bipolar depression (H[facility]) Diagnosis: Principal Problem:   Bipolar depression ([facility]) Active Problems:   HTN (hypertension)   Gastroenteritis   Sprained ankle  Total Time spent with patient: 20 minutes  Ms. Hargens is a  44y.o. female that has a previous psychiatric history of depression, possible bipolar disorder who presents to the [facility] unit for treatment of depression in the context of suicidal thoughts.    Interval History Patient was seen today for re-evaluation.  Nursing reports no events overnight. Patient isolates to self except during snacks and medication distribution.  SUBJECTIVE: On assessment patient reports feeling a little better after medication change. Little more energetic. Slept better. Still depressed. Not suicidal. Still does not want to be around people. No hallucinations no psychosis. No side effects from current medications. Complaints about increased anxiety - Vistaril order placed "as needed". Copmplaints about ankle pain and that Ibuprofen and Tylenol don`t help - Naproxen order placed PRN.   Labs: No new for review    Past Psychiatric History: see H&P  Past Medical History:  Past Medical History:  Diagnosis Date  . Alcohol abuse   . Anemia   . Anxiety   . Blood transfusion without reported diagnosis   . Depression   . Hemorrhoid   . Hypertension   . Migraine     Past Surgical History:  Procedure Laterality Date  . HEMORRHOID SURGERY    . HERNIA REPAIR    . TONSILLECTOMY    . VENTRAL HERNIA REPAIR     Family History:  Family History  Problem Relation Age of Onset  . Osteoarthritis Mother   . Diabetes Mother   . Hypertension Mother   . Depression Mother   . Hypertension Father   . Diabetes Father   . Asthma Son   . Deep vein thrombosis Maternal Grandmother   . Deep vein thrombosis Maternal Grandfather   . Stroke Neg Hx    Family Psychiatric   History: see H&P Social History:  Social History   Substance and Sexual Activity  Alcohol Use No     Social History   Substance and Sexual Activity  Drug Use No    Social History   Socioeconomic History  . Marital status: Divorced    Spouse name: Not on file  . Number of children: 2  . Years of education: Not on file  . Highest education level: Not on file  Occupational History    Employer: [employer]  Tobacco Use  . Smoking status: Former Smoker    Years: 10.00  . Smokeless tobacco: Never Used  . Tobacco comment: smoked 1994-2012, up to 1 pp WEEK  Vaping Use  . Vaping Use: Never used  Substance and Sexual Activity  . Alcohol use: No  . Drug use: No  . Sexual activity: Not on file  Other Topics Concern  . Not on file  Social History Narrative   Separated, not on BCP       Social Determinants of Health   Financial Resource Strain:   . Difficulty of Paying Living Expenses: Not on file  Food Insecurity:   . Worried About Programme researcher, broadcasting/film/video in the Last Year: Not on file  . Ran Out of Food in the Last Year: Not on file  Transportation Needs:   . Lack of Transportation (Medical): Not on file  . Lack of Transportation (Non-Medical): Not on file  Physical  Activity:   . Days of Exercise per Week: Not on file  . Minutes of Exercise per Session: Not on file  Stress:   . Feeling of Stress : Not on file  Social Connections:   . Frequency of Communication with Friends and Family: Not on file  . Frequency of Social Gatherings with Friends and Family: Not on file  . Attends Religious Services: Not on file  . Active Member of Clubs or Organizations: Not on file  . Attends Banker Meetings: Not on file  . Marital Status: Not on file   Additional Social History:                         Sleep: Good  Appetite:  Good  Current Medications: Current Facility-Administered Medications  Medication Dose Route Frequency Provider Last Rate Last  Admin  . acetaminophen (TYLENOL) tablet 650 mg  650 mg Oral Q6H PRN Clapacs, Jackquline Denmark, MD   650 mg at 01/15/20 0820  . alum & mag hydroxide-simeth (MAALOX/MYLANTA) 200-200-20 MG/5ML suspension 30 mL  30 mL Oral Q4H PRN Clapacs, John T, MD      . amLODipine (NORVASC) tablet 2.5 mg  2.5 mg Oral Daily Clapacs, John T, MD   2.5 mg at 01/15/20 0816  . buPROPion (WELLBUTRIN XL) 24 hr tablet 300 mg  300 mg Oral Daily Clapacs, Jackquline Denmark, MD   300 mg at 01/15/20 0816  . cephALEXin (KEFLEX) capsule 1,000 mg  1,000 mg Oral BID Clapacs, John T, MD   1,000 mg at 01/15/20 0816  . hydrOXYzine (ATARAX/VISTARIL) tablet 25 mg  25 mg Oral Q4H PRN Thalia Party, MD      . ibuprofen (ADVIL) tablet 600 mg  600 mg Oral Q6H PRN Clapacs, Jackquline Denmark, MD   600 mg at 01/15/20 1233  . magnesium hydroxide (MILK OF MAGNESIA) suspension 30 mL  30 mL Oral Daily PRN Clapacs, John T, MD      . metoprolol succinate (TOPROL-XL) 24 hr tablet 25 mg  25 mg Oral Daily Clapacs, John T, MD   25 mg at 01/15/20 0816  . naproxen (NAPROSYN) tablet 375 mg  375 mg Oral BID PRN Thalia Party, MD      . ondansetron (ZOFRAN) tablet 4 mg  4 mg Oral Q8H PRN Clapacs, Jackquline Denmark, MD   4 mg at 01/14/20 0927  . phenazopyridine (PYRIDIUM) tablet 200 mg  200 mg Oral TID PRN Clapacs, John T, MD      . QUEtiapine (SEROQUEL) tablet 25 mg  25 mg Oral BID Clapacs, Jackquline Denmark, MD   25 mg at 01/15/20 0816  . QUEtiapine (SEROQUEL) tablet 300 mg  300 mg Oral QHS Clapacs, John T, MD   300 mg at 01/14/20 2205  . traZODone (DESYREL) tablet 200 mg  200 mg Oral QHS Clapacs, Jackquline Denmark, MD   200 mg at 01/14/20 2204    Lab Results: No results found for this or any previous visit (from the past 48 hour(s)).  Blood Alcohol level:  Lab Results  Component Value Date   [facility] <10 01/12/2020   ETH <11 07/26/2013    Metabolic Disorder Labs: Lab Results  Component Value Date   HGBA1C 5.1 08/07/2017   No results found for: PROLACTIN Lab Results  Component Value Date   CHOL 176 08/07/2017    TRIG 135.0 08/07/2017   HDL 37.20 (L) 08/07/2017   CHOLHDL 5 08/07/2017   VLDL 27.0 08/07/2017  LDLCALC 112 (H) 08/07/2017   LDLCALC 114 (H) 09/29/2012    Physical Findings: AIMS:  , ,  ,  ,    CIWA:    COWS:     Musculoskeletal: Strength & Muscle Tone: within normal limits Gait & Station: normal Patient leans: N/A  Psychiatric Specialty Exam: Physical Exam  Review of Systems  Blood pressure (!) 141/92, pulse 92, temperature 97.8 F (36.6 C), temperature source Oral, resp. rate 17, height 5' (1.524 m), weight 131.5 kg, SpO2 96 %.Body mass index is 56.64 kg/m.  General Appearance: Casual  Eye Contact:  Good  Speech:  Normal Rate  Volume:  Normal  Mood:  Depressed  Affect:  Congruent and Constricted  Thought Process:  Coherent and Goal Directed  Orientation:  Full (Time, Place, and Person)  Thought Content:  Logical  Suicidal Thoughts:  No  Homicidal Thoughts:  No  Memory:  Immediate;   Fair Recent;   Fair Remote;   Fair  Judgement:  Fair  Insight:  Fair  Psychomotor Activity:  Normal  Concentration:  Concentration: Fair and Attention Span: Fair  Recall:  [employer] of Knowledge:  Fair  Language:  Fair  Akathisia:  No  Handed:  Right  AIMS (if indicated):     Assets:  Communication Skills Desire for Improvement Housing Social Support  ADL's:  Intact  Cognition:  WNL  Sleep:  Number of Hours: 8     Treatment Plan Summary: Daily contact with patient to assess and evaluate symptoms and progress in treatment and Medication management   Patient is a 44 year old female with the above-stated past psychiatric history who is seen in follow-up.  Chart reviewed. Patient discussed with nursing. Patient reports slight mood improvement after her medications were changed on admission. No med changes today, except of addition of PRN Vistaril and Naproxen.    Plan:  -continue inpatient psych admission; 15-minute checks; daily contact with patient to assess and  evaluate symptoms and progress in treatment; psychoeducation.  -continue scheduled psych medications: . amLODipine  2.5 mg Oral Daily  . buPROPion  300 mg Oral Daily  . cephALEXin  1,000 mg Oral BID  . metoprolol succinate  25 mg Oral Daily  . QUEtiapine  25 mg Oral BID  . QUEtiapine  300 mg Oral QHS  . traZODone  200 mg Oral QHS    -continue PRN medications.  acetaminophen, alum & mag hydroxide-simeth, hydrOXYzine, ibuprofen, magnesium hydroxide, naproxen, ondansetron, phenazopyridine  -Pertinent Labs: no new labs ordered today    -Consults: No new consults placed since yesterday    -Disposition: Estimated duration of hospitalization: midweek next week. All necessary aftercare will be arranged prior to discharge Likely d/c home with outpatient psych follow-up.  -  I certify that the patient does need, on a daily basis, active treatment furnished directly by or requiring the supervision of inpatient psychiatric facility personnel.  For inpatient care, physician will direct treatment team plan within three days of admission and at least weekly thereafter.     Thalia Party, MD 01/15/2020, 1:50 PM

## 2020-01-15 NOTE — [facility] Group Notes (Signed)
[facility] Group Notes: (Clinical Social Work)   01/15/2020      Type of Therapy:  Group Therapy   Participation Level:  Did Not Attend - was invited individually by Nurse/MHT and chose not to attend.   Susa Simmonds, LCSWA 01/15/2020  5:28 PM

## 2020-01-15 NOTE — Progress Notes (Signed)
D: Pt alert and oriented. Pt rates depression 4/10 and anxiety 8/10. Pt reports experiencing 7/10 ankle pain, prn meds given. Pt denies experiencing any SI/HI, or AVH at this time.   A: Scheduled medications administered to pt, per MD orders. Support and encouragement provided. Frequent verbal contact made. Routine safety checks conducted q15 minutes.   R: No adverse drug reactions noted. Pt verbally contracts for safety at this time. Pt complaint with medications and treatment plan. Pt interacts well with others on the unit minimally. Pt remains safe at this time. Will continue to monitor.

## 2020-01-16 MED ORDER — LORAZEPAM 1 MG PO TABS
1.0000 mg | ORAL_TABLET | Freq: Once | ORAL | Status: AC
  Administered 2020-01-16: 1 mg via ORAL
  Filled 2020-01-16: qty 1

## 2020-01-16 NOTE — Progress Notes (Signed)
Patient is quiet and reserved upon approach.  She alert and oriented X 4. She did not attend group and has been isolative to her room this evening. She reports her ankle pain has reduced slightly.  She is calm and cooperative and med compliant as she denies SI/HI/AVH. She tolerated her meds without incident.  She remains safe on the unit and informed to contact staff with any concerns.      Cleo Butler-Nicholson, LPN

## 2020-01-16 NOTE — [facility] Counselor (Signed)
CSW spoke with patient about completing her SPE. Patient stated that she had spoken to CSW Michaela during the week and she is currently still thinking about who CSW can contact while she is in the hospital to complete the SPE. CSW asked patient to let her know today if she changes her mind.

## 2020-01-16 NOTE — Plan of Care (Signed)
  Problem: Education: Goal: Knowledge of [facility] Education information/materials will improve Outcome: Progressing Goal: Emotional status will improve Outcome: Progressing Goal: Mental status will improve Outcome: Progressing Goal: Verbalization of understanding the information provided will improve Outcome: Progressing   Problem: Activity: Goal: Interest or engagement in activities will improve Outcome: Progressing Goal: Sleeping patterns will improve Outcome: Progressing   Problem: Coping: Goal: Ability to verbalize frustrations and anger appropriately will improve Outcome: Progressing Goal: Ability to demonstrate self-control will improve Outcome: Progressing   Problem: Health Behavior/Discharge Planning: Goal: Identification of resources available to assist in meeting health care needs will improve Outcome: Progressing Goal: Compliance with treatment plan for underlying cause of condition will improve Outcome: Progressing   Problem: Physical Regulation: Goal: Ability to maintain clinical measurements within normal limits will improve Outcome: Progressing   Problem: Safety: Goal: Periods of time without injury will increase Outcome: Progressing   Problem: Education: Goal: Utilization of techniques to improve thought processes will improve Outcome: Progressing Goal: Knowledge of the prescribed therapeutic regimen will improve Outcome: Progressing   Problem: Activity: Goal: Interest or engagement in leisure activities will improve Outcome: Progressing Goal: Imbalance in normal sleep/wake cycle will improve Outcome: Progressing   Problem: Coping: Goal: Coping ability will improve Outcome: Progressing Goal: Will verbalize feelings Outcome: Progressing   Problem: Health Behavior/Discharge Planning: Goal: Ability to make decisions will improve Outcome: Progressing Goal: Compliance with therapeutic regimen will improve Outcome: Progressing    Problem: Role Relationship: Goal: Will demonstrate positive changes in social behaviors and relationships Outcome: Progressing   Problem: Safety: Goal: Ability to disclose and discuss suicidal ideas will improve Outcome: Progressing Goal: Ability to identify and utilize support systems that promote safety will improve Outcome: Progressing   Problem: Self-Concept: Goal: Will verbalize positive feelings about self Outcome: Progressing Goal: Level of anxiety will decrease Outcome: Progressing   

## 2020-01-16 NOTE — Progress Notes (Signed)
Pt is alert and oriented to person, place, time and situation. Pt is calm, cooperative, pleasant, tangential, denies suicidal and homicidal ideation, denies hallucinations. Pt has been isolating in her room, out for meds and meals. Pt c/o of anxiety and is given PRN Vistaril, which pt reports was effective, and c/o right ankle pain, which pt uses a medical boot since a sprain. Pt denies depression, denies suicidal and homicidal ideation, denies hallucinations. No distress noted, none reported, will continue to monitor pt per Q15 minute face checks and monitor for safety and progress.

## 2020-01-16 NOTE — Progress Notes (Signed)
Patient requested and provided medication for pain and anxiety.  Tolerated meds without incident and remains safe on the unit with 15 minute safety checks.    Marilyn Fowler.

## 2020-01-16 NOTE — Progress Notes (Signed)
Patient is pleasant and easy to engage in conversation. She reports being stressed and having increase in anxiety and depression since learning she may be getting evicted from her home.  Patient encouraged to speak with social worker in the morning and ask for help finding resources that may be able to help her situation.  She is med compliant and tolerates her medication without inicident. She has been isolative to her room, but will come out for snack and medication. She remains safe on the unit with 15 minute safety checks and advised to contact staff with any concerns.      Cleo Butler-Nicholson, LPN

## 2020-01-16 NOTE — Progress Notes (Signed)
Pt reports that she is acutely stressed after her mother told her that they just got an eviction notice. Pt reports her mother is on disability and diabetic and she cares for her at home. MD made aware and orders obtained. Pt given PRN Ativan 1mg  once for this complaint.

## 2020-01-16 NOTE — [facility] Group Notes (Signed)
[facility] Group Notes: (Clinical Social Work)   01/16/2020      Type of Therapy:  Group Therapy   Participation Level:  Did Not Attend - was invited individually by Nurse/MHT and chose not to attend.   Susa Simmonds, LCSWA 01/16/2020  3:21 PM

## 2020-01-16 NOTE — Progress Notes (Signed)
[facility] MD Progress Note  01/16/2020 11:56 AM Marilyn Fowler  MRN:  [id number]  Principal Problem: Bipolar depression (HCC) Diagnosis: Principal Problem:   Bipolar depression (HCC) Active Problems:   HTN (hypertension)   Gastroenteritis   Sprained ankle  Total Time spent with patient: 20 minutes  Ms. Falkner is a  44y.o. female that has a previous psychiatric history of depression, possible bipolar disorder who presents to the [facility] unit for treatment of depression in the context of suicidal thoughts.    Interval History Patient was seen today for re-evaluation.  Nursing reports no events overnight. Patient continues isolate to self except during snacks and medication distribution.  SUBJECTIVE: On assessment patient reports she had headache followed by insomnia last night and it affected her mood - more depressed. She says that she took Ibuprofen, that helped with her ankle pain and is helping with her headache, so her mood is getting better. She does not related headache to Wellbutrin, says she was on the medications for years without issues. She denies feeling suicidal. Still does not want to be around people. No hallucinations. No side effects from current medications.   Labs: No new for review    Past Psychiatric History: see H&P  Past Medical History:  Past Medical History:  Diagnosis Date  . Alcohol abuse   . Anemia   . Anxiety   . Blood transfusion without reported diagnosis   . Depression   . Hemorrhoid   . Hypertension   . Migraine     Past Surgical History:  Procedure Laterality Date  . HEMORRHOID SURGERY    . HERNIA REPAIR    . TONSILLECTOMY    . VENTRAL HERNIA REPAIR     Family History:  Family History  Problem Relation Age of Onset  . Osteoarthritis Mother   . Diabetes Mother   . Hypertension Mother   . Depression Mother   . Hypertension Father   . Diabetes Father   . Asthma Son   . Deep vein thrombosis Maternal Grandmother   . Deep vein thrombosis  Maternal Grandfather   . Stroke Neg Hx    Family Psychiatric  History: see H&P Social History:  Social History   Substance and Sexual Activity  Alcohol Use No     Social History   Substance and Sexual Activity  Drug Use No    Social History   Socioeconomic History  . Marital status: Divorced    Spouse name: Not on file  . Number of children: 2  . Years of education: Not on file  . Highest education level: Not on file  Occupational History    Employer: [employer]  Tobacco Use  . Smoking status: Former Smoker    Years: 10.00  . Smokeless tobacco: Never Used  . Tobacco comment: smoked 1994-2012, up to 1 pp WEEK  Vaping Use  . Vaping Use: Never used  Substance and Sexual Activity  . Alcohol use: No  . Drug use: No  . Sexual activity: Not on file  Other Topics Concern  . Not on file  Social History Narrative   Separated, not on BCP       Social Determinants of Health   Financial Resource Strain:   . Difficulty of Paying Living Expenses: Not on file  Food Insecurity:   . Worried About Programme researcher, broadcasting/film/video in the Last Year: Not on file  . Ran Out of Food in the Last Year: Not on file  Transportation Needs:   .  Lack of Transportation (Medical): Not on file  . Lack of Transportation (Non-Medical): Not on file  Physical Activity:   . Days of Exercise per Week: Not on file  . Minutes of Exercise per Session: Not on file  Stress:   . Feeling of Stress : Not on file  Social Connections:   . Frequency of Communication with Friends and Family: Not on file  . Frequency of Social Gatherings with Friends and Family: Not on file  . Attends Religious Services: Not on file  . Active Member of Clubs or Organizations: Not on file  . Attends Banker Meetings: Not on file  . Marital Status: Not on file   Additional Social History:                         Sleep: Good  Appetite:  Good  Current Medications: Current Facility-Administered  Medications  Medication Dose Route Frequency Provider Last Rate Last Admin  . acetaminophen (TYLENOL) tablet 650 mg  650 mg Oral Q6H PRN Clapacs, Jackquline Denmark, MD   650 mg at 01/15/20 0820  . alum & mag hydroxide-simeth (MAALOX/MYLANTA) 200-200-20 MG/5ML suspension 30 mL  30 mL Oral Q4H PRN Clapacs, John T, MD      . amLODipine (NORVASC) tablet 2.5 mg  2.5 mg Oral Daily Clapacs, John T, MD   2.5 mg at 01/16/20 0900  . buPROPion (WELLBUTRIN XL) 24 hr tablet 300 mg  300 mg Oral Daily Clapacs, John T, MD   300 mg at 01/16/20 0900  . cephALEXin (KEFLEX) capsule 1,000 mg  1,000 mg Oral BID Clapacs, John T, MD   1,000 mg at 01/16/20 0900  . hydrOXYzine (ATARAX/VISTARIL) tablet 25 mg  25 mg Oral Q4H PRN Thalia Party, MD   25 mg at 01/16/20 0859  . ibuprofen (ADVIL) tablet 600 mg  600 mg Oral Q6H PRN Clapacs, Jackquline Denmark, MD   600 mg at 01/16/20 0859  . magnesium hydroxide (MILK OF MAGNESIA) suspension 30 mL  30 mL Oral Daily PRN Clapacs, John T, MD      . metoprolol succinate (TOPROL-XL) 24 hr tablet 25 mg  25 mg Oral Daily Clapacs, John T, MD   25 mg at 01/16/20 0900  . naproxen (NAPROSYN) tablet 375 mg  375 mg Oral BID PRN Thalia Party, MD   375 mg at 01/15/20 1712  . ondansetron (ZOFRAN) tablet 4 mg  4 mg Oral Q8H PRN Clapacs, Jackquline Denmark, MD   4 mg at 01/16/20 0900  . phenazopyridine (PYRIDIUM) tablet 200 mg  200 mg Oral TID PRN Clapacs, John T, MD      . QUEtiapine (SEROQUEL) tablet 25 mg  25 mg Oral BID Clapacs, John T, MD   25 mg at 01/16/20 0900  . QUEtiapine (SEROQUEL) tablet 300 mg  300 mg Oral QHS Clapacs, John T, MD   300 mg at 01/15/20 2121  . traZODone (DESYREL) tablet 200 mg  200 mg Oral QHS Clapacs, Jackquline Denmark, MD   200 mg at 01/15/20 2121    Lab Results: No results found for this or any previous visit (from the past 48 hour(s)).  Blood Alcohol level:  Lab Results  Component Value Date   [facility] <10 01/12/2020   ETH <11 07/26/2013    Metabolic Disorder Labs: Lab Results  Component Value Date   HGBA1C  5.1 08/07/2017   No results found for: PROLACTIN Lab Results  Component Value Date   CHOL 176  08/07/2017   TRIG 135.0 08/07/2017   HDL 37.20 (L) 08/07/2017   CHOLHDL 5 08/07/2017   VLDL 27.0 08/07/2017   LDLCALC 112 (H) 08/07/2017   LDLCALC 114 (H) 09/29/2012    Physical Findings: AIMS:  , ,  ,  ,    CIWA:    COWS:     Musculoskeletal: Strength & Muscle Tone: within normal limits Gait & Station: normal Patient leans: N/A  Psychiatric Specialty Exam: Physical Exam   Review of Systems   Blood pressure (!) 127/98, pulse 94, temperature 98.1 F (36.7 C), temperature source Oral, resp. rate 18, height 5' (1.524 m), weight 131.5 kg, SpO2 95 %.Body mass index is 56.64 kg/m.  General Appearance: Casual  Eye Contact:  Good  Speech:  Normal Rate  Volume:  Normal  Mood:  Depressed  Affect:  Congruent and Constricted  Thought Process:  Coherent and Goal Directed  Orientation:  Full (Time, Place, and Person)  Thought Content:  Logical  Suicidal Thoughts:  No  Homicidal Thoughts:  No  Memory:  Immediate;   Fair Recent;   Fair Remote;   Fair  Judgement:  Fair  Insight:  Fair  Psychomotor Activity:  Normal  Concentration:  Concentration: Fair and Attention Span: Fair  Recall:  [employer] of Knowledge:  Fair  Language:  Fair  Akathisia:  No  Handed:  Right  AIMS (if indicated):     Assets:  Communication Skills Desire for Improvement Housing Social Support  ADL's:  Intact  Cognition:  WNL  Sleep:  Number of Hours: 5.75     Treatment Plan Summary: Daily contact with patient to assess and evaluate symptoms and progress in treatment and Medication management   Patient is a 44 year old female with the above-stated past psychiatric history who is seen in follow-up.  Chart reviewed. Patient discussed with nursing. Patient reports slight mood improvement after her medications were changed on admission. No med changes today.    Plan:  -continue inpatient psych  admission; 15-minute checks; daily contact with patient to assess and evaluate symptoms and progress in treatment; psychoeducation.  -continue scheduled psych medications: . amLODipine  2.5 mg Oral Daily  . buPROPion  300 mg Oral Daily  . cephALEXin  1,000 mg Oral BID  . metoprolol succinate  25 mg Oral Daily  . QUEtiapine  25 mg Oral BID  . QUEtiapine  300 mg Oral QHS  . traZODone  200 mg Oral QHS    -continue PRN medications.  acetaminophen, alum & mag hydroxide-simeth, hydrOXYzine, ibuprofen, magnesium hydroxide, naproxen, ondansetron, phenazopyridine  -Pertinent Labs: no new labs ordered today    -Consults: No new consults placed since yesterday    -Disposition: Estimated duration of hospitalization: midweek next week. All necessary aftercare will be arranged prior to discharge Likely d/c home with outpatient psych follow-up.  -  I certify that the patient does need, on a daily basis, active treatment furnished directly by or requiring the supervision of inpatient psychiatric facility personnel.  For inpatient care, physician will direct treatment team plan within three days of admission and at least weekly thereafter.     Thalia Party, MD 01/16/2020, 11:56 AM

## 2020-01-17 NOTE — Progress Notes (Signed)
Patient alert and oriented x 4, affect is flat, she brightens upon approach, her thoughts are organized and coherent, she denies SI/HI/AVH, she was noted to isolate to self except during snacks and medication time. Patient has a boot intact to right leg, her gait appears unsteady, she c/o of leg pain intermittently. Patient was offered emotional support and encouraged to attend evening wrap up group. Patient did not attend group, she was complaint with evening medication regimen. 15 minutes safety checks maintained will continue to monitor.

## 2020-01-17 NOTE — Plan of Care (Signed)
Patient is pleasant and cooperative on approach.Patient isolates in her room.Patient states that she has social anxiety to attend groups.Patient states that when she thinks about issues at home that triggers her anxiety.Patient denies SI,HI and AVH.Patient states that she is ready for discharge.Vistaril x2  helped with anxiety.Patient did not eat lunch for nausea.Zofran given.Support and encouragement given.

## 2020-01-17 NOTE — [facility] Group Notes (Signed)
[facility] Group Notes:  (Nursing/MHT/Case Management/Adjunct)  Date:  01/17/2020  Time:  9:48 AM  Type of Therapy:  Community Meeting  Participation Level:  Did Not Attend   Lynelle Smoke [facility] 01/17/2020, 9:48 AM

## 2020-01-17 NOTE — [facility] Group Notes (Signed)
[facility] Group Notes: (Clinical Social Work)   01/17/2020      Type of Therapy:  Group Therapy   Participation Level:  Did Not Attend - was invited individually by Nurse/MHT and chose not to attend.   Leisa Lenz, LCSW 01/17/2020  2:40 PM

## 2020-01-17 NOTE — Progress Notes (Signed)
Recreation Therapy Notes   Date: 01/17/2020  Time: 9:30 am   Location: Craft room     Behavioral response: N/A   Intervention Topic: Self-Esteem   Discussion/Intervention: Patient did not attend group.   Clinical Observations/Feedback:  Patient did not attend group.   Marilyn Fowler LRT/CTRS        Marilyn Fowler 01/17/2020 11:39 AM 

## 2020-01-17 NOTE — Progress Notes (Signed)
[facility] MD Progress Note  01/17/2020 12:55 PM Kylah Maresh  MRN:  [id number] Subjective: Follow-up for this patient with depression and anxiety.  Patient feels better today.  Still nervous and worried a little bit.  Feels more comfortable with discharge tomorrow.  No specific complaints about medication Principal Problem: Bipolar depression (HCC) Diagnosis: Principal Problem:   Bipolar depression (HCC) Active Problems:   HTN (hypertension)   Gastroenteritis   Sprained ankle  Total Time spent with patient: 30 minutes  Past Psychiatric History: Past history of bipolar disorder  Past Medical History:  Past Medical History:  Diagnosis Date  . Alcohol abuse   . Anemia   . Anxiety   . Blood transfusion without reported diagnosis   . Depression   . Hemorrhoid   . Hypertension   . Migraine     Past Surgical History:  Procedure Laterality Date  . HEMORRHOID SURGERY    . HERNIA REPAIR    . TONSILLECTOMY    . VENTRAL HERNIA REPAIR     Family History:  Family History  Problem Relation Age of Onset  . Osteoarthritis Mother   . Diabetes Mother   . Hypertension Mother   . Depression Mother   . Hypertension Father   . Diabetes Father   . Asthma Son   . Deep vein thrombosis Maternal Grandmother   . Deep vein thrombosis Maternal Grandfather   . Stroke Neg Hx    Family Psychiatric  History: See previous Social History:  Social History   Substance and Sexual Activity  Alcohol Use No     Social History   Substance and Sexual Activity  Drug Use No    Social History   Socioeconomic History  . Marital status: Divorced    Spouse name: Not on file  . Number of children: 2  . Years of education: Not on file  . Highest education level: Not on file  Occupational History    Employer: [employer]  Tobacco Use  . Smoking status: Former Smoker    Years: 10.00  . Smokeless tobacco: Never Used  . Tobacco comment: smoked 1994-2012, up to 1 pp WEEK  Vaping Use  . Vaping  Use: Never used  Substance and Sexual Activity  . Alcohol use: No  . Drug use: No  . Sexual activity: Not on file  Other Topics Concern  . Not on file  Social History Narrative   Separated, not on BCP       Social Determinants of Health   Financial Resource Strain:   . Difficulty of Paying Living Expenses: Not on file  Food Insecurity:   . Worried About Programme researcher, broadcasting/film/video in the Last Year: Not on file  . Ran Out of Food in the Last Year: Not on file  Transportation Needs:   . Lack of Transportation (Medical): Not on file  . Lack of Transportation (Non-Medical): Not on file  Physical Activity:   . Days of Exercise per Week: Not on file  . Minutes of Exercise per Session: Not on file  Stress:   . Feeling of Stress : Not on file  Social Connections:   . Frequency of Communication with Friends and Family: Not on file  . Frequency of Social Gatherings with Friends and Family: Not on file  . Attends Religious Services: Not on file  . Active Member of Clubs or Organizations: Not on file  . Attends Banker Meetings: Not on file  . Marital Status: Not on file  Additional Social History:                         Sleep: Fair  Appetite:  Fair  Current Medications: Current Facility-Administered Medications  Medication Dose Route Frequency Provider Last Rate Last Admin  . acetaminophen (TYLENOL) tablet 650 mg  650 mg Oral Q6H PRN Henri Baumler, Jackquline Denmark, MD   650 mg at 01/16/20 2120  . alum & mag hydroxide-simeth (MAALOX/MYLANTA) 200-200-20 MG/5ML suspension 30 mL  30 mL Oral Q4H PRN Tahiri Shareef T, MD      . amLODipine (NORVASC) tablet 2.5 mg  2.5 mg Oral Daily Yannely Kintzel T, MD   2.5 mg at 01/17/20 0814  . buPROPion (WELLBUTRIN XL) 24 hr tablet 300 mg  300 mg Oral Daily Fredis Malkiewicz, Jackquline Denmark, MD   300 mg at 01/17/20 0813  . cephALEXin (KEFLEX) capsule 1,000 mg  1,000 mg Oral BID Carrol Hougland T, MD   1,000 mg at 01/17/20 0813  . hydrOXYzine (ATARAX/VISTARIL) tablet  25 mg  25 mg Oral Q4H PRN Thalia Party, MD   25 mg at 01/17/20 0819  . ibuprofen (ADVIL) tablet 600 mg  600 mg Oral Q6H PRN Sharnice Bosler, Jackquline Denmark, MD   600 mg at 01/16/20 1621  . magnesium hydroxide (MILK OF MAGNESIA) suspension 30 mL  30 mL Oral Daily PRN Machi Whittaker T, MD      . metoprolol succinate (TOPROL-XL) 24 hr tablet 25 mg  25 mg Oral Daily Samwise Eckardt T, MD   25 mg at 01/17/20 0814  . naproxen (NAPROSYN) tablet 375 mg  375 mg Oral BID PRN Thalia Party, MD   375 mg at 01/15/20 1712  . ondansetron (ZOFRAN) tablet 4 mg  4 mg Oral Q8H PRN Kayce Betty, Jackquline Denmark, MD   4 mg at 01/16/20 0900  . phenazopyridine (PYRIDIUM) tablet 200 mg  200 mg Oral TID PRN Osie Amparo T, MD      . QUEtiapine (SEROQUEL) tablet 25 mg  25 mg Oral BID Fawne Hughley, Jackquline Denmark, MD   25 mg at 01/17/20 0815  . QUEtiapine (SEROQUEL) tablet 300 mg  300 mg Oral QHS Hersh Minney T, MD   300 mg at 01/16/20 2118  . traZODone (DESYREL) tablet 200 mg  200 mg Oral QHS Timisha Mondry, Jackquline Denmark, MD   200 mg at 01/16/20 2119    Lab Results: No results found for this or any previous visit (from the past 48 hour(s)).  Blood Alcohol level:  Lab Results  Component Value Date   [facility] <10 01/12/2020   ETH <11 07/26/2013    Metabolic Disorder Labs: Lab Results  Component Value Date   HGBA1C 5.1 08/07/2017   No results found for: PROLACTIN Lab Results  Component Value Date   CHOL 176 08/07/2017   TRIG 135.0 08/07/2017   HDL 37.20 (L) 08/07/2017   CHOLHDL 5 08/07/2017   VLDL 27.0 08/07/2017   LDLCALC 112 (H) 08/07/2017   LDLCALC 114 (H) 09/29/2012    Physical Findings: AIMS:  , ,  ,  ,    CIWA:    COWS:     Musculoskeletal: Strength & Muscle Tone: within normal limits Gait & Station: normal Patient leans: N/A  Psychiatric Specialty Exam: Physical Exam Vitals and nursing note reviewed.  Constitutional:      Appearance: She is well-developed.  HENT:     Head: Normocephalic and atraumatic.  Eyes:     Conjunctiva/sclera:  Conjunctivae normal.  Pupils: Pupils are equal, round, and reactive to light.  Cardiovascular:     Heart sounds: Normal heart sounds.  Pulmonary:     Effort: Pulmonary effort is normal.  Abdominal:     Palpations: Abdomen is soft.  Musculoskeletal:        General: Normal range of motion.     Cervical back: Normal range of motion.  Skin:    General: Skin is warm and dry.  Neurological:     General: No focal deficit present.     Mental Status: She is alert.  Psychiatric:        Mood and Affect: Mood normal.     Review of Systems  Constitutional: Negative.   HENT: Negative.   Eyes: Negative.   Respiratory: Negative.   Cardiovascular: Negative.   Gastrointestinal: Negative.   Musculoskeletal: Negative.   Skin: Negative.   Neurological: Negative.   Psychiatric/Behavioral: Negative.     Blood pressure 120/87, pulse (!) 116, temperature 98.1 F (36.7 C), temperature source Oral, resp. rate 18, height 5' (1.524 m), weight 131.5 kg, SpO2 96 %.Body mass index is 56.64 kg/m.  General Appearance: Casual  Eye Contact:  Good  Speech:  Clear and Coherent  Volume:  Normal  Mood:  Euthymic  Affect:  Constricted  Thought Process:  Goal Directed  Orientation:  Full (Time, Place, and Person)  Thought Content:  Logical  Suicidal Thoughts:  No  Homicidal Thoughts:  No  Memory:  Immediate;   Fair Recent;   Fair Remote;   Fair  Judgement:  Fair  Insight:  Fair  Psychomotor Activity:  Normal  Concentration:  Concentration: Fair  Recall:  [employer] of Knowledge:  Fair  Language:  Fair  Akathisia:  No  Handed:  Right  AIMS (if indicated):     Assets:  Desire for Improvement  ADL's:  Intact  Cognition:  WNL  Sleep:  Number of Hours: 8     Treatment Plan Summary: Plan No change to medication.  Supportive counseling and therapy and review of treatment goals.  Likely discharge tomorrow.  Treatment team notified.  Mordecai Rasmussen, MD 01/17/2020, 12:55 PM

## 2020-01-17 NOTE — [facility] Group Notes (Signed)
[facility] Group Notes:  (Nursing/MHT/Case Management/Adjunct)  Date:  01/17/2020  Time:  5:28 PM  Type of Therapy:  Psychoeducational Skills  Participation Level:  Did Not Attend  :Clint Guy 01/17/2020, 5:28 PM

## 2020-01-18 ENCOUNTER — Other Ambulatory Visit: Payer: Self-pay | Admitting: Physician Assistant

## 2020-01-18 MED ORDER — AMLODIPINE BESYLATE 2.5 MG PO TABS
2.5000 mg | ORAL_TABLET | Freq: Every day | ORAL | 1 refills | Status: DC
Start: 2020-01-19 — End: 2022-10-03

## 2020-01-18 MED ORDER — HYDROXYZINE HCL 25 MG PO TABS
25.0000 mg | ORAL_TABLET | ORAL | 1 refills | Status: DC | PRN
Start: 2020-01-18 — End: 2020-04-19

## 2020-01-18 MED ORDER — PHENAZOPYRIDINE HCL 200 MG PO TABS
200.0000 mg | ORAL_TABLET | Freq: Three times a day (TID) | ORAL | 0 refills | Status: DC | PRN
Start: 2020-01-18 — End: 2020-09-20

## 2020-01-18 MED ORDER — QUETIAPINE FUMARATE 300 MG PO TABS
300.0000 mg | ORAL_TABLET | Freq: Every day | ORAL | 1 refills | Status: DC
Start: 2020-01-18 — End: 2020-04-03

## 2020-01-18 MED ORDER — METOPROLOL SUCCINATE ER 25 MG PO TB24
25.0000 mg | ORAL_TABLET | Freq: Every day | ORAL | 1 refills | Status: DC
Start: 2020-01-19 — End: 2022-06-09

## 2020-01-18 MED ORDER — TRAZODONE HCL 100 MG PO TABS
200.0000 mg | ORAL_TABLET | Freq: Every day | ORAL | 1 refills | Status: DC
Start: 2020-01-18 — End: 2020-04-03

## 2020-01-18 MED ORDER — NAPROXEN 375 MG PO TABS
375.0000 mg | ORAL_TABLET | Freq: Two times a day (BID) | ORAL | 0 refills | Status: DC | PRN
Start: 2020-01-18 — End: 2020-12-19

## 2020-01-18 MED ORDER — QUETIAPINE FUMARATE 25 MG PO TABS
25.0000 mg | ORAL_TABLET | Freq: Two times a day (BID) | ORAL | 1 refills | Status: DC
Start: 2020-01-18 — End: 2020-04-03

## 2020-01-18 MED ORDER — BUPROPION HCL ER (XL) 300 MG PO TB24
300.0000 mg | ORAL_TABLET | Freq: Every day | ORAL | 1 refills | Status: DC
Start: 2020-01-19 — End: 2020-04-03

## 2020-01-18 NOTE — Plan of Care (Signed)
  Problem: Group Participation Goal: STG - Patient will engage in groups without prompting or encouragement from LRT x3 group sessions within 5 recreation therapy group sessions Description: STG - Patient will engage in groups without prompting or encouragement from LRT x3 group sessions within 5 recreation therapy group sessions 01/18/2020 1203 by Ernest Haber, LRT Outcome: Not Applicable 7/84/1282 0813 by Ernest Haber, LRT Outcome: Not Met (add Reason) Note: Patient did not attend any groups.

## 2020-01-18 NOTE — Progress Notes (Signed)
Recreation Therapy Notes  INPATIENT RECREATION TR PLAN  Patient Details Name: Marilyn Fowler MRN: [id number] DOB: 1975-07-12 Today's Date: 01/18/2020  Rec Therapy Plan Is patient appropriate for Therapeutic Recreation?: Yes Treatment times per week: at least 3 Estimated Length of Stay: 5-7 days TR Treatment/Interventions: Group participation (Comment)  Discharge Criteria Pt will be discharged from therapy if:: Discharged Treatment plan/goals/alternatives discussed and agreed upon by:: Patient/family  Discharge Summary Short term goals set: Patient will engage in groups without prompting or encouragement from LRT x3 group sessions within 5 recreation therapy group sessions Short term goals met: Not met Reason goals not met: Patient did not attend any groups Therapeutic equipment acquired: N/A Reason patient discharged from therapy: Discharge from hospital Pt/family agrees with progress & goals achieved: Yes Date patient discharged from therapy: 01/18/20   Shaneeka Scarboro 01/18/2020, 12:06 PM

## 2020-01-18 NOTE — Progress Notes (Signed)
Recreation Therapy Notes  Date: 01/18/2020  Time: 9:30 am   Location: Craft room     Behavioral response: N/A   Intervention Topic: Happiness   Discussion/Intervention: Patient did not attend group.   Clinical Observations/Feedback:  Patient did not attend group.   Treyten Monestime LRT/CTRS         Zorion Nims 01/18/2020 11:59 AM

## 2020-01-18 NOTE — Progress Notes (Signed)
D: Pt alert and oriented. Pt rates depression 4/10, hopelessness 5/10, and anxiety 8/10.Pt goal: "Go home." Pt reports energy level as normal and concentration as being good. Pt reports sleep last night as being good. Pt did receive medications for sleep and did find them helpful. Pt reports experiencing 5/10 Right ankle pain, prn meds given. Pt denies experiencing any SI/HI, or AVH at this time.   A: Scheduled medications administered to pt, per MD orders. Support and encouragement provided. Frequent verbal contact made. Routine safety checks conducted q15 minutes.   R: No adverse drug reactions noted. Pt verbally contracts for safety at this time. Pt complaint with medications. Pt interacts well with others on the unit minimally. Pt remains safe at this time. Will continue to monitor.

## 2020-01-18 NOTE — BHH Suicide Risk Assessment (Signed)
[facility] Discharge Suicide Risk Assessment   Principal Problem: Bipolar depression (HCC) Discharge Diagnoses: Principal Problem:   Bipolar depression (HCC) Active Problems:   HTN (hypertension)   Gastroenteritis   Sprained ankle   Total Time spent with patient: 30 minutes  Musculoskeletal: Strength & Muscle Tone: within normal limits Gait & Station: normal Patient leans: N/A  Psychiatric Specialty Exam: Review of Systems  Constitutional: Negative.   HENT: Negative.   Eyes: Negative.   Respiratory: Negative.   Cardiovascular: Negative.   Gastrointestinal: Negative.   Musculoskeletal: Negative.   Skin: Negative.   Neurological: Negative.   Psychiatric/Behavioral: Negative.     Blood pressure 111/74, pulse 98, temperature 98.2 F (36.8 C), temperature source Oral, resp. rate 17, height 5' (1.524 m), weight 131.5 kg, SpO2 98 %.Body mass index is 56.64 kg/m.  General Appearance: Casual  Eye Contact::  Good  Speech:  Clear and Coherent409  Volume:  Normal  Mood:  Euthymic  Affect:  Congruent  Thought Process:  Goal Directed  Orientation:  Full (Time, Place, and Person)  Thought Content:  Logical  Suicidal Thoughts:  No  Homicidal Thoughts:  No  Memory:  Immediate;   Fair Recent;   Fair Remote;   Fair  Judgement:  Fair  Insight:  Fair  Psychomotor Activity:  Normal  Concentration:  Fair  Recall:  [employer] of Knowledge:Fair  Language: Fair  Akathisia:  No  Handed:  Right  AIMS (if indicated):     Assets:  Desire for Improvement Housing Physical Health Resilience  Sleep:  Number of Hours: 7.75  Cognition: WNL  ADL's:  Intact   Mental Status Per Nursing Assessment::   On Admission:  NA  Demographic Factors:  NA  Loss Factors: NA  Historical Factors: NA  Risk Reduction Factors:   Positive social support, Positive therapeutic relationship and Positive coping skills or problem solving skills  Continued Clinical Symptoms:  Depression:    Anhedonia  Cognitive Features That Contribute To Risk:  None    Suicide Risk:  Minimal: No identifiable suicidal ideation.  Patients presenting with no risk factors but with morbid ruminations; may be classified as minimal risk based on the severity of the depressive symptoms    Plan Of Care/Follow-up recommendations:  Activity:  Activity as tolerated Diet:  Regular diet Other:  Follow-up with outpatient treatment at [facility] as previously.  Mordecai Rasmussen, MD 01/18/2020, 9:34 AM

## 2020-01-18 NOTE — Progress Notes (Signed)
  [facility] Adult Case Management Discharge Plan :  Will you be returning to the same living situation after discharge:  Yes,  pt reports that she is returning home.  At discharge, do you have transportation home?: Yes,  pt reports that her mother will pick her up.   Do you have the ability to pay for your medications: No.  Release of information consent forms completed and in the chart;  Patient's signature needed at discharge.  Patient to Follow up at:  Follow-up Information    Group, [facility]. Go on 02/01/2020.   Specialty: Behavioral Health Why: Your appointment with Rosey Bath is scheduled for, 3:30PM on 02/01/2020.  Thanks! Contact information: [address] [place] [address] [phone]        [facility] Follow up on 01/21/2020.   Why: Your aapointment is scheduled for 01/21/2020 at 1:00PM with Ellison Carwin.  Please bring photo ID with you to your appointment.  Expect the appointment to take a couple of hours. Thanks! Contact information: [address] [place] [address] [phone]               Next level of care provider has access to [facility] Link:no  Safety Planning and Suicide Prevention discussed: Yes,  SPE completed wiht the patient.     Has patient been referred to the Quitline?: Patient refused referral  Patient has been referred for addiction treatment: Pt. refused referral  Harden Mo, LCSW 01/18/2020, 10:32 AM

## 2020-01-18 NOTE — BHH Suicide Risk Assessment (Signed)
BHH INPATIENT:  Family/Significant Other Suicide Prevention Education  Suicide Prevention Education:  Patient Refusal for Family/Significant Other Suicide Prevention Education: The patient Marilyn Fowler has refused to provide written consent for family/significant other to be provided Family/Significant Other Suicide Prevention Education during admission and/or prior to discharge.  Physician notified.  SPE completed with pt, as pt refused to consent to family contact. SPI pamphlet provided to pt and pt was encouraged to share information with support network, ask questions, and talk about any concerns relating to SPE. Pt denies access to guns/firearms and verbalized understanding of information provided. Mobile Crisis information also provided to pt.   Harden Mo 01/18/2020, 9:47 AM

## 2020-01-18 NOTE — Discharge Summary (Signed)
Physician Discharge Summary Note  Patient:  Marilyn Fowler is an 44 y.o., female MRN:  [id number] DOB:  1975-05-19 Patient phone:  [phone] (home)  Patient address:   [address] [place] [address],  Total Time spent with patient: 30 minutes  Date of Admission:  01/13/2020 Date of Discharge: 01/18/2020  Reason for Admission: Admitted because of worsening depressive symptoms with some suicidal ideation  Principal Problem: Bipolar depression (HCC) Discharge Diagnoses: Principal Problem:   Bipolar depression (HCC) Active Problems:   HTN (hypertension)   Gastroenteritis   Sprained ankle   Past Psychiatric History: History of recurrent depression and mood lability with possible bipolar disorder of some sort.  Past Medical History:  Past Medical History:  Diagnosis Date   Alcohol abuse    Anemia    Anxiety    Blood transfusion without reported diagnosis    Depression    Hemorrhoid    Hypertension    Migraine     Past Surgical History:  Procedure Laterality Date   HEMORRHOID SURGERY     HERNIA REPAIR     TONSILLECTOMY     VENTRAL HERNIA REPAIR     Family History:  Family History  Problem Relation Age of Onset   Osteoarthritis Mother    Diabetes Mother    Hypertension Mother    Depression Mother    Hypertension Father    Diabetes Father    Asthma Son    Deep vein thrombosis Maternal Grandmother    Deep vein thrombosis Maternal Grandfather    Stroke Neg Hx    Family Psychiatric  History: See previous Social History:  Social History   Substance and Sexual Activity  Alcohol Use No     Social History   Substance and Sexual Activity  Drug Use No    Social History   Socioeconomic History   Marital status: Divorced    Spouse name: Not on file   Number of children: 2   Years of education: Not on file   Highest education level: Not on file  Occupational History    Employer: [employer]  Tobacco Use   Smoking  status: Former Smoker    Years: 10.00   Smokeless tobacco: Never Used   Tobacco comment: smoked 1994-2012, up to 1 pp WEEK  Vaping Use   Vaping Use: Never used  Substance and Sexual Activity   Alcohol use: No   Drug use: No   Sexual activity: Not on file  Other Topics Concern   Not on file  Social History Narrative   Separated, not on BCP       Social Determinants of Health   Financial Resource Strain:    Difficulty of Paying Living Expenses: Not on file  Food Insecurity:    Worried About Programme researcher, broadcasting/film/video in the Last Year: Not on file   [employer] of Food in the Last Year: Not on file  Transportation Needs:    Lack of Transportation (Medical): Not on file   Lack of Transportation (Non-Medical): Not on file  Physical Activity:    Days of Exercise per Week: Not on file   Minutes of Exercise per Session: Not on file  Stress:    Feeling of Stress : Not on file  Social Connections:    Frequency of Communication with Friends and Family: Not on file   Frequency of Social Gatherings with Friends and Family: Not on file   Attends Religious Services: Not on file   Active Member of  Clubs or Organizations: Not on file   Attends Club or Organization Meetings: Not on file   Marital Status: Not on file    Hospital Course: Patient was kept on 15-minute checks.  She did not show any dangerous aggressive violent or suicidal behavior in the hospital.  She was cooperative in general with treatment.  Medication management was adjusted with antidepressants and Seroquel for her depression and anxiety.  Patient tolerated medicine well.  At the time of discharge reports mood is better and denies suicidal ideation.  Patient was treated with Naprosyn primarily for her pain from her ankle sprain.  No narcotics provided at discharge.  She is denying suicidal ideation at this point and is lucid and agreeable to outpatient follow-up as usual  Physical Findings: AIMS:  , ,  ,  ,     CIWA:    COWS:     Musculoskeletal: Strength & Muscle Tone: within normal limits Gait & Station: normal Patient leans: N/A  Psychiatric Specialty Exam: Physical Exam Vitals and nursing note reviewed.  Constitutional:      Appearance: She is well-developed.  HENT:     Head: Normocephalic and atraumatic.  Eyes:     Conjunctiva/sclera: Conjunctivae normal.     Pupils: Pupils are equal, round, and reactive to light.  Cardiovascular:     Heart sounds: Normal heart sounds.  Pulmonary:     Effort: Pulmonary effort is normal.  Abdominal:     Palpations: Abdomen is soft.  Musculoskeletal:        General: Normal range of motion.     Cervical back: Normal range of motion.  Skin:    General: Skin is warm and dry.  Neurological:     General: No focal deficit present.     Mental Status: She is alert.  Psychiatric:        Mood and Affect: Mood normal.     Review of Systems  Constitutional: Negative.   HENT: Negative.   Eyes: Negative.   Respiratory: Negative.   Cardiovascular: Negative.   Gastrointestinal: Negative.   Musculoskeletal: Negative.   Skin: Negative.   Neurological: Negative.   Psychiatric/Behavioral: Negative.     Blood pressure 111/74, pulse 98, temperature 98.2 F (36.8 C), temperature source Oral, resp. rate 17, height 5' (1.524 m), weight 131.5 kg, SpO2 98 %.Body mass index is 56.64 kg/m.  General Appearance: Casual  Eye Contact:  Good  Speech:  Clear and Coherent  Volume:  Normal  Mood:  Euthymic  Affect:  Congruent  Thought Process:  Goal Directed  Orientation:  Full (Time, Place, and Person)  Thought Content:  Logical  Suicidal Thoughts:  No  Homicidal Thoughts:  No  Memory:  Immediate;   Fair Recent;   Fair Remote;   Fair  Judgement:  Fair  Insight:  Fair  Psychomotor Activity:  Normal  Concentration:  Concentration: Fair  Recall:  [employer] of Knowledge:  Fair  Language:  Fair  Akathisia:  No  Handed:  Right  AIMS (if indicated):      Assets:  Desire for Improvement Housing Physical Health Resilience Social Support  ADL's:  Intact  Cognition:  WNL  Sleep:  Number of Hours: 7.75        Has this patient used any form of tobacco in the last 30 days? (Cigarettes, Smokeless Tobacco, Cigars, and/or Pipes) Yes, No  Blood Alcohol level:  Lab Results  Component Value Date   ETH <10 01/12/2020   ETH <11 07/26/2013  Metabolic Disorder Labs:  Lab Results  Component Value Date   HGBA1C 5.1 08/07/2017   No results found for: PROLACTIN Lab Results  Component Value Date   CHOL 176 08/07/2017   TRIG 135.0 08/07/2017   HDL 37.20 (L) 08/07/2017   CHOLHDL 5 08/07/2017   VLDL 27.0 08/07/2017   LDLCALC 112 (H) 08/07/2017   LDLCALC 114 (H) 09/29/2012    See Psychiatric Specialty Exam and Suicide Risk Assessment completed by Attending Physician prior to discharge.  Discharge destination:  Home  Is patient on multiple antipsychotic therapies at discharge:  No   Has Patient had three or more failed trials of antipsychotic monotherapy by history:  No  Recommended Plan for Multiple Antipsychotic Therapies: NA  Discharge Instructions    Diet - low sodium heart healthy   Complete by: As directed    Increase activity slowly   Complete by: As directed      Allergies as of 01/18/2020      Reactions   Methocarbamol    Other reaction(s): Other (See Comments) Agitated    Toradol [ketorolac Tromethamine] Other (See Comments)   Pt states it makes her jittery, felt she had something crawling on her       Medication List    STOP taking these medications   busPIRone 15 MG tablet Commonly known as: BUSPAR   cephALEXin 500 MG capsule Commonly known as: Keflex   FLUoxetine 40 MG capsule Commonly known as: PROZAC     TAKE these medications     Indication  amLODipine 2.5 MG tablet Commonly known as: NORVASC Take 1 tablet (2.5 mg total) by mouth daily. Start taking on: January 19, 2020 What changed: See  the new instructions.  Indication: High Blood Pressure Disorder   buPROPion 300 MG 24 hr tablet Commonly known as: WELLBUTRIN XL Take 1 tablet (300 mg total) by mouth daily. Start taking on: January 19, 2020  Indication: Major Depressive Disorder   hydrOXYzine 25 MG tablet Commonly known as: ATARAX/VISTARIL Take 1 tablet (25 mg total) by mouth every 4 (four) hours as needed for anxiety.  Indication: Feeling Anxious   metoprolol succinate 25 MG 24 hr tablet Commonly known as: TOPROL-XL Take 1 tablet (25 mg total) by mouth daily. Start taking on: January 19, 2020  Indication: High Blood Pressure Disorder   naproxen 375 MG tablet Commonly known as: NAPROSYN Take 1 tablet (375 mg total) by mouth 2 (two) times daily as needed for moderate pain.  Indication: Pain   phenazopyridine 200 MG tablet Commonly known as: Pyridium Take 1 tablet (200 mg total) by mouth 3 (three) times daily as needed (urinary discomfort). What changed: reasons to take this  Indication: Bladder Lining Irritation   QUEtiapine 25 MG tablet Commonly known as: SEROQUEL Take 1 tablet (25 mg total) by mouth 2 (two) times daily.  Indication: Major Depressive Disorder   QUEtiapine 300 MG tablet Commonly known as: SEROQUEL Take 1 tablet (300 mg total) by mouth at bedtime.  Indication: Major Depressive Disorder   traZODone 100 MG tablet Commonly known as: DESYREL Take 2 tablets (200 mg total) by mouth at bedtime.  Indication: Trouble Sleeping        Follow-up recommendations:  Activity:  Activity as tolerated Diet:  Regular diet Other:  Follow-up with outpatient treatment [facility]  Comments: Prescriptions provided at discharge  Signed: Mordecai Rasmussen, MD 01/18/2020, 9:40 AM

## 2020-01-18 NOTE — Progress Notes (Signed)
D: Pt alert and oriented. Pt denies experiencing any SI/HI, or AVH at this time. Pt reports she will be able to keep herself safe when she returns home.   A: Pt received discharge and medication education/information. Pt belongings were returned and signed for at this time to include printed prescriptions.   R: Pt verbalized understanding of discharge and medication education/information.  Pt escorted by staff to medical mall front lobby where pt's mother picked her up.

## 2020-02-01 ENCOUNTER — Ambulatory Visit: Payer: Self-pay | Admitting: Physician Assistant

## 2020-02-09 ENCOUNTER — Other Ambulatory Visit: Payer: Self-pay | Admitting: Physician Assistant

## 2020-03-03 ENCOUNTER — Encounter (HOSPITAL_COMMUNITY): Payer: Self-pay

## 2020-03-03 ENCOUNTER — Ambulatory Visit (HOSPITAL_COMMUNITY)
Admission: RE | Admit: 2020-03-03 | Discharge: 2020-03-03 | Disposition: A | Discharge status: Home / Self Care | Payer: Self-pay | Attending: Psychiatry | Admitting: Psychiatry

## 2020-03-03 ENCOUNTER — Other Ambulatory Visit: Payer: Self-pay

## 2020-03-03 ENCOUNTER — Emergency Department (HOSPITAL_COMMUNITY): Payer: Self-pay

## 2020-03-03 ENCOUNTER — Emergency Department (HOSPITAL_COMMUNITY)
Admission: EM | Admit: 2020-03-03 | Discharge: 2020-03-03 | Disposition: A | Discharge status: Home / Self Care | Payer: Self-pay | Attending: Emergency Medicine | Admitting: Emergency Medicine

## 2020-03-03 DIAGNOSIS — S0990XA Unspecified injury of head, initial encounter: Secondary | ICD-10-CM | POA: Insufficient documentation

## 2020-03-03 DIAGNOSIS — Z789 Other specified health status: Secondary | ICD-10-CM

## 2020-03-03 DIAGNOSIS — R45851 Suicidal ideations: Secondary | ICD-10-CM | POA: Insufficient documentation

## 2020-03-03 DIAGNOSIS — W19XXXA Unspecified fall, initial encounter: Secondary | ICD-10-CM

## 2020-03-03 DIAGNOSIS — F1010 Alcohol abuse, uncomplicated: Secondary | ICD-10-CM | POA: Insufficient documentation

## 2020-03-03 DIAGNOSIS — Z7289 Other problems related to lifestyle: Secondary | ICD-10-CM

## 2020-03-03 DIAGNOSIS — Z79899 Other long term (current) drug therapy: Secondary | ICD-10-CM | POA: Insufficient documentation

## 2020-03-03 DIAGNOSIS — W228XXA Striking against or struck by other objects, initial encounter: Secondary | ICD-10-CM | POA: Insufficient documentation

## 2020-03-03 DIAGNOSIS — Z1330 Encounter for screening examination for mental health and behavioral disorders, unspecified: Secondary | ICD-10-CM | POA: Insufficient documentation

## 2020-03-03 DIAGNOSIS — I10 Essential (primary) hypertension: Secondary | ICD-10-CM | POA: Insufficient documentation

## 2020-03-03 DIAGNOSIS — Z87891 Personal history of nicotine dependence: Secondary | ICD-10-CM | POA: Insufficient documentation

## 2020-03-03 LAB — CBC
HCT: 39.3 % (ref 36.0–46.0)
Hemoglobin: 11.9 g/dL — ABNORMAL LOW (ref 12.0–15.0)
MCH: 22.8 pg — ABNORMAL LOW (ref 26.0–34.0)
MCHC: 30.3 g/dL (ref 30.0–36.0)
MCV: 75.4 fL — ABNORMAL LOW (ref 80.0–100.0)
Platelets: 473 10*3/uL — ABNORMAL HIGH (ref 150–400)
RBC: 5.21 MIL/uL — ABNORMAL HIGH (ref 3.87–5.11)
RDW: 16.6 % — ABNORMAL HIGH (ref 11.5–15.5)
WBC: 14.8 10*3/uL — ABNORMAL HIGH (ref 4.0–10.5)
nRBC: 0 % (ref 0.0–0.2)

## 2020-03-03 LAB — BASIC METABOLIC PANEL
Anion gap: 18 — ABNORMAL HIGH (ref 5–15)
BUN: 7 mg/dL (ref 6–20)
CO2: 17 mmol/L — ABNORMAL LOW (ref 22–32)
Calcium: 8.9 mg/dL (ref 8.9–10.3)
Chloride: 102 mmol/L (ref 98–111)
Creatinine, Ser: 0.74 mg/dL (ref 0.44–1.00)
GFR, Estimated: >60 mL/min (ref >60)
Glucose, Bld: 111 mg/dL — ABNORMAL HIGH (ref 70–99)
Potassium: 3.9 mmol/L (ref 3.5–5.1)
Sodium: 137 mmol/L (ref 135–145)

## 2020-03-03 LAB — HEPATIC FUNCTION PANEL
ALT: 21 U/L (ref 0–44)
AST: 30 U/L (ref 15–41)
Albumin: 4.5 g/dL (ref 3.5–5.0)
Alkaline Phosphatase: 87 U/L (ref 38–126)
Bilirubin, Direct: 0.1 mg/dL (ref 0.0–0.2)
Indirect Bilirubin: 0.4 mg/dL (ref 0.3–0.9)
Total Bilirubin: 0.5 mg/dL (ref 0.3–1.2)
Total Protein: 8 g/dL (ref 6.5–8.1)

## 2020-03-03 LAB — ETHANOL: Alcohol, Ethyl (B): 108 mg/dL — ABNORMAL HIGH (ref <10)

## 2020-03-03 LAB — I-STAT BETA HCG BLOOD, ED (MC, WL, AP ONLY): I-stat hCG, quantitative: <5 m[IU]/mL (ref <5)

## 2020-03-03 MED ORDER — LORAZEPAM 1 MG PO TABS
1.0000 mg | ORAL_TABLET | Freq: Once | ORAL | Status: AC
  Administered 2020-03-03: 1 mg via ORAL
  Filled 2020-03-03: qty 1

## 2020-03-03 MED ORDER — SODIUM CHLORIDE 0.9 % IV BOLUS
1000.0000 mL | Freq: Once | INTRAVENOUS | Status: AC
  Administered 2020-03-03: 1000 mL via INTRAVENOUS

## 2020-03-03 MED ORDER — PROMETHAZINE HCL 25 MG/ML IJ SOLN: 12.5000 mg | Freq: Once | INTRAMUSCULAR | Status: DC

## 2020-03-03 MED ORDER — PROMETHAZINE HCL 25 MG/ML IJ SOLN
12.5000 mg | Freq: Once | INTRAMUSCULAR | Status: AC
  Administered 2020-03-03: 12.5 mg via INTRAVENOUS
  Filled 2020-03-03: qty 1

## 2020-03-03 MED ORDER — LORAZEPAM 2 MG/ML IJ SOLN
1.0000 mg | Freq: Once | INTRAMUSCULAR | Status: AC
  Administered 2020-03-03: 1 mg via INTRAVENOUS
  Filled 2020-03-03: qty 1

## 2020-03-03 MED ORDER — QUETIAPINE FUMARATE 25 MG PO TABS
25.0000 mg | ORAL_TABLET | Freq: Every day | ORAL | Status: DC
  Administered 2020-03-03: 25 mg via ORAL
  Filled 2020-03-03 (×2): qty 1

## 2020-03-03 MED ORDER — THIAMINE HCL 100 MG/ML IJ SOLN
100.0000 mg | Freq: Once | INTRAMUSCULAR | Status: AC
  Administered 2020-03-03: 100 mg via INTRAVENOUS
  Filled 2020-03-03: qty 2

## 2020-03-03 MED ORDER — ONDANSETRON 8 MG PO TBDP
8.0000 mg | ORAL_TABLET | Freq: Once | ORAL | Status: DC
  Administered 2020-03-03: 8 mg via ORAL
  Filled 2020-03-03: qty 1

## 2020-03-03 NOTE — BH Assessment (Signed)
Assessment Note  Marilyn Fowler is an 44 y.o. female present with [facility] as a walk-in with complaints of depression and suicidal ideations with no plan. Patient report history of depression starting 5 years ago most recent depressive symptoms present for 2 months. Suicidal ideations present two weeks both triggered by arguments with her son. Patient report being on an alcohol binge for the past 3 days drinking unknown amounts of been and vodka. Report also falling yesterday hitting head. Complains of headache and having a knot on her head from the fall. Depressive symptoms worthlessness and hopeless. Patient spent 3 weeks at [facility] 12/2019. Patient did not follow up with [facility] due to reporting she already has a doctor there. Report last seen the psychiatrist at [facility] three or four months ago. Report suicide attempt 2 years via overdose could not recall the trigger. Denied homicidal ideation, denied auditory/visual hallucinations. Denied access to weapons.    Disposition: Nanine Means, DNP, recommendation transfer to [facility] for observation   Diagnosis: Depression  Past Medical History:  Past Medical History:  Diagnosis Date  . Alcohol abuse   . Anemia   . Anxiety   . Blood transfusion without reported diagnosis   . Depression   . Hemorrhoid   . Hypertension   . Migraine     Past Surgical History:  Procedure Laterality Date  . HEMORRHOID SURGERY    . HERNIA REPAIR    . TONSILLECTOMY    . VENTRAL HERNIA REPAIR      Family History:  Family History  Problem Relation Age of Onset  . Osteoarthritis Mother   . Diabetes Mother   . Hypertension Mother   . Depression Mother   . Hypertension Father   . Diabetes Father   . Asthma Son   . Deep vein thrombosis Maternal Grandmother   . Deep vein thrombosis Maternal Grandfather   . Stroke Neg Hx     Social History:  reports that she has quit smoking. She quit after 10.00 years of use. She has never used smokeless tobacco. She reports  that she does not drink alcohol and does not use drugs.  Additional Social History:  Alcohol / Drug Use Pain Medications: see MAR Prescriptions: SEE MAR Over the Counter: SEE MAR History of alcohol / drug use?: Yes Withdrawal Symptoms: Nausea / Vomiting, Tremors, Other (Comment) (headache) Substance #1 Name of Substance 1: Alcohol 1 - Amount (size/oz): unknown (been / alcohol) 1 - Frequency: binge past 3 days 1 - Last Use / Amount: 03/02/2020 (report been on alcohol binge past 3 days drinking been / volka)  CIWA: CIWA-Ar BP: (!) 164/88 Pulse Rate: (!) 122 COWS:    Allergies:  Allergies  Allergen Reactions  . Methocarbamol     Other reaction(s): Other (See Comments) Agitated   . Toradol [Ketorolac Tromethamine] Other (See Comments)    Pt states it makes her jittery, felt she had something crawling on her     Home Medications: (Not in a hospital admission)   OB/GYN Status:  No LMP recorded.  General Assessment Data Location of Assessment:  [facility] Assessment) TTS Assessment: In system Is this a Tele or Face-to-Face Assessment?: Face-to-Face Is this an Initial Assessment or a Re-assessment for this encounter?: Initial Assessment Patient Accompanied by:: N/A (alone) Language Other than English: No Living Arrangements: Other (Comment) (report lives with her mother Marilyn Fowler) What gender do you identify as?: Female Marital status: Divorced Pregnancy Status: No Living Arrangements: Parent, Children Can pt return to current living arrangement?: Yes Admission  Status: Voluntary Is patient capable of signing voluntary admission?: Yes Referral Source: Self/Family/Friend Insurance type: self-pay  Medical Screening Exam [facility] Walk-in ONLY) Medical Exam completed: Yes  Crisis Care Plan Living Arrangements: Parent, Children [facility] of Psychiatrist[facility] of Therapist: [facility]  Education Status Is patient currently in school?: No Highest grade of school patient has  completed: some college Is the patient employed, unemployed or receiving disability?: Unemployed  Risk to self with the past 6 months Suicidal Ideation: Yes-Currently Present (report suicidal ideation with no plan ) Has patient been a risk to self within the past 6 months prior to admission? : No Suicidal Intent: No Has patient had any suicidal intent within the past 6 months prior to admission? : No Is patient at risk for suicide?: No Suicidal Plan?: No Has patient had any suicidal plan within the past 6 months prior to admission? : No Access to Means: No What has been your use of drugs/alcohol within the last 12 months?: alcohol (been/volka) Previous Attempts/Gestures: Yes How many times?:  (report 1 attempt 2 years ago ) Other Self Harm Risks: none report  Triggers for Past Attempts: Other (Comment) (pt report could not remember ) Intentional Self Injurious Behavior: None Family Suicide History: No Recent stressful life event(s): Other (Comment) (argument with her son ) Persecutory voices/beliefs?: No Depression: Yes Depression Symptoms: Feeling worthless/self pity (suicidal ideations w/no plan ) Substance abuse history and/or treatment for substance abuse?: No Suicide prevention information given to non-admitted patients: Not applicable  Risk to Others within the past 6 months Homicidal Ideation: No Does patient have any lifetime risk of violence toward others beyond the six months prior to admission? : No Thoughts of Harm to Others: No Current Homicidal Intent: No Current Homicidal Plan: No Access to Homicidal Means: No Identified Victim: n/a History of harm to others?: No Assessment of Violence: None Noted Violent Behavior Description: none Noted Does patient have access to weapons?: No Criminal Charges Pending?: No Does patient have a court date: No Is patient on probation?: No  Psychosis Hallucinations: None noted Delusions: None noted  Mental Status  Report Appearance/Hygiene: Unremarkable Eye Contact: Fair Motor Activity: Freedom of movement Speech: Logical/coherent Level of Consciousness: Alert Mood: Pleasant Affect: Anxious, Appropriate to circumstance Anxiety Level: None Thought Processes: Coherent, Relevant Judgement: Impaired Orientation: Person, Place, Time, Situation Obsessive Compulsive Thoughts/Behaviors: None  Cognitive Functioning Concentration: Normal Memory: Recent Intact, Remote Intact Is patient IDD: No Insight: Poor Impulse Control: Fair Appetite: Poor (report has not eaten in 48 hours been drinking ) Have you had any weight changes? : No Change Sleep: Decreased Total Hours of Sleep: 4 Vegetative Symptoms: None  ADLScreening [address] [facility] Assessment Services) Patient's cognitive ability adequate to safely complete daily activities?: Yes Patient able to express need for assistance with ADLs?: Yes  Prior Inpatient Therapy Prior Inpatient Therapy: Yes Prior Therapy Dates: [facility]  Reason for Treatment: depression   Prior Outpatient Therapy Prior Outpatient Therapy: Yes Prior Therapy Facilty/Provider(s): [facility]  Reason for Treatment: mental health  Does patient have an ACCT team?: No Does patient have Intensive In-House Services?  : No Does patient have [facility] services? : No Does patient have P4CC services?: No  ADL Screening (condition at time of admission) Patient's cognitive ability adequate to safely complete daily activities?: Yes Is the patient deaf or have difficulty hearing?: No Does the patient have difficulty seeing, even when wearing glasses/contacts?: No Does the patient have difficulty concentrating, remembering, or making decisions?: No Patient able to express need for  assistance with ADLs?: Yes Does the patient have difficulty dressing or bathing?: No       Abuse/Neglect Assessment (Assessment to be complete while patient is alone) Abuse/Neglect Assessment Can Be Completed:  Yes Physical Abuse: Denies Verbal Abuse: Denies Sexual Abuse: Denies Exploitation of patient/patient's resources: Denies Self-Neglect: Denies     Merchant navy officer (For Healthcare) Does Patient Have a Medical Advance Directive?: No Would patient like information on creating a medical advance directive?: No - Patient declined          Disposition:  Disposition Initial Assessment Completed for this Encounter: Yes Nanine Means, NP, overnight [facility] ) Disposition of Patient: Transfer Nanine Means, NP, overnight [facility] )  On Site Evaluation by:   Reviewed with Physician:    Dian Situ 03/03/2020 3:18 PM

## 2020-03-03 NOTE — ED Notes (Signed)
Zofran administered prior to d/c order

## 2020-03-03 NOTE — ED Triage Notes (Signed)
Pt arrived via walk in, c/o fall after dizzy spell earlier this morning. States she was intoxicated when this happened, hit head. Now c/o migraine since. Also c/o SI thoughts, denies plan. Denies HI. States she is also in alcohol withdrawal, states binge drinking x2 days.

## 2020-03-03 NOTE — ED Notes (Signed)
Assumed care of patient at this time, nad noted, sr up x2, bed locked and low, call bell w/I reach.  Will continue to monitor.  Sitter at the bedside.

## 2020-03-03 NOTE — ED Provider Notes (Signed)
[facility]-EMERGENCY DEPT Provider Note   CSN: [id number] Arrival date & time: 03/03/20  1605     History Chief Complaint  Patient presents with  . Suicidal  . Fall  . Dizziness    Marilyn Fowler is a 44 y.o. female.  HPI Patient presents after fall where she hit her head.  States she got drunk and fell striking her forehead.  Since then has had severe headache.  Nausea and vomiting.  Mild photophobia.  States she has history of migraines and states this may feel the same and may feel different.  States she has a hard time knowing.  States she has been drinking a lot of alcohol recently.  States she feels as if she may be withdrawing at this time also.  Had been drinking some today.  States that no diarrhea.  States no abdominal pain no other traumatic injury. Had been seen at behavioral [facility] and sent over here for clearance and placement to behavioral health urgent care, has had some mild suicidal thoughts without plan.  However patient states she is not going to the urgent care.  States that her mother can take care of her.  Does have a history of depression due to the alcohol use.  I do not think there is criteria to IVC her at this time.  Patient states she did not take her psychiatric medicines this morning and some of them last night.    Past Medical History:  Diagnosis Date  . Alcohol abuse   . Anemia   . Anxiety   . Blood transfusion without reported diagnosis   . Depression   . Hemorrhoid   . Hypertension   . Migraine     Patient Active Problem List   Diagnosis Date Noted  . Sprained ankle 01/14/2020  . Bipolar depression (HCC) 01/13/2020  . Physical exam 08/07/2017  . Headache 06/30/2017  . Chronic right shoulder pain 09/04/2016  . Hypokalemia 04/30/2016  . Hypomagnesemia 04/30/2016  . Alcoholic hepatitis 04/30/2016  . Alcohol induced fatty liver 04/30/2016  . Alcohol abuse 04/29/2016  . Left medial knee pain 06/29/2015  .  Acute bacterial sinusitis 10/11/2014  . Maxillary sinusitis, acute 09/14/2014  . Gastroenteritis 11/09/2013  . Pleuritic chest pain 09/17/2013  . Chest pain 11/18/2012  . Morbid obesity (HCC) 09/29/2012  . HTN (hypertension) 07/12/2012  . Migraine 07/12/2012  . Bipolar II disorder, most recent episode major depressive (HCC) 07/11/2012    Past Surgical History:  Procedure Laterality Date  . HEMORRHOID SURGERY    . HERNIA REPAIR    . TONSILLECTOMY    . VENTRAL HERNIA REPAIR       OB History   No obstetric history on file.     Family History  Problem Relation Age of Onset  . Osteoarthritis Mother   . Diabetes Mother   . Hypertension Mother   . Depression Mother   . Hypertension Father   . Diabetes Father   . Asthma Son   . Deep vein thrombosis Maternal Grandmother   . Deep vein thrombosis Maternal Grandfather   . Stroke Neg Hx     Social History   Tobacco Use  . Smoking status: Former Smoker    Years: 10.00  . Smokeless tobacco: Never Used  . Tobacco comment: smoked 1994-2012, up to 1 pp WEEK  Vaping Use  . Vaping Use: Never used  Substance Use Topics  . Alcohol use: No  . Drug use: No    Home  Medications Prior to Admission medications   Medication Sig Start Date End Date Taking? Authorizing Provider  amLODipine (NORVASC) 2.5 MG tablet Take 1 tablet (2.5 mg total) by mouth daily. 01/19/20  Yes Clapacs, Jackquline Denmark, MD  Ascorbic Acid (VITAMIN C PO) Take 1 tablet by mouth daily.   Yes [provider]  buPROPion (WELLBUTRIN XL) 300 MG 24 hr tablet Take 1 tablet (300 mg total) by mouth daily. 01/19/20  Yes Clapacs, Jackquline Denmark, MD  Cholecalciferol (VITAMIN D3 PO) Take 1 tablet by mouth daily.   Yes [provider]  hydrOXYzine (ATARAX/VISTARIL) 25 MG tablet Take 1 tablet (25 mg total) by mouth every 4 (four) hours as needed for anxiety. 01/18/20  Yes Clapacs, Jackquline Denmark, MD  ibuprofen (ADVIL) 200 MG tablet Take 400 mg by mouth every 4 (four) hours as needed for  headache (pain).   Yes [provider]  metoprolol succinate (TOPROL-XL) 25 MG 24 hr tablet Take 1 tablet (25 mg total) by mouth daily. 01/19/20  Yes Clapacs, Jackquline Denmark, MD  QUEtiapine (SEROQUEL) 25 MG tablet Take 1 tablet (25 mg total) by mouth 2 (two) times daily. Patient taking differently: Take 25 mg by mouth See admin instructions. Take one tablet (25 mg) by mouth twice daily - morning and afternoon 01/18/20  Yes Clapacs, Jackquline Denmark, MD  QUEtiapine (SEROQUEL) 300 MG tablet Take 1 tablet (300 mg total) by mouth at bedtime. 01/18/20  Yes Clapacs, Jackquline Denmark, MD  traZODone (DESYREL) 100 MG tablet Take 2 tablets (200 mg total) by mouth at bedtime. 01/18/20  Yes Clapacs, Jackquline Denmark, MD  naproxen (NAPROSYN) 375 MG tablet Take 1 tablet (375 mg total) by mouth 2 (two) times daily as needed for moderate pain. Patient not taking: Reported on 03/03/2020 01/18/20   Clapacs, Jackquline Denmark, MD  phenazopyridine (PYRIDIUM) 200 MG tablet Take 1 tablet (200 mg total) by mouth 3 (three) times daily as needed (urinary discomfort). Patient not taking: Reported on 03/03/2020 01/18/20   Clapacs, Jackquline Denmark, MD    Allergies    Methocarbamol and Toradol [ketorolac tromethamine]  Review of Systems   Review of Systems  Constitutional: Negative for appetite change.  HENT: Negative for congestion.   Eyes: Positive for photophobia.  Respiratory: Negative for shortness of breath.   Cardiovascular: Negative for chest pain.  Gastrointestinal: Positive for nausea. Negative for abdominal pain.  Genitourinary: Negative for flank pain.  Musculoskeletal: Negative for back pain.  Skin: Negative for rash.  Neurological: Positive for headaches.  Psychiatric/Behavioral: Negative for confusion.    Physical Exam Updated Vital Signs BP (!) 161/111   Pulse (!) 108   Temp 98.1 F (36.7 C) (Oral)   Resp 20   SpO2 100%   Physical Exam Vitals reviewed.  HENT:     Head:     Comments: Hematoma to left forehead. Eyes:     Extraocular  Movements: Extraocular movements intact.     Pupils: Pupils are equal, round, and reactive to light.  Cardiovascular:     Rate and Rhythm: Tachycardia present.  Pulmonary:     Breath sounds: No wheezing or rhonchi.  Abdominal:     Tenderness: There is no abdominal tenderness.  Musculoskeletal:        General: No tenderness.  Skin:    General: Skin is warm.     Capillary Refill: Capillary refill takes more than 3 seconds.  Neurological:     Mental Status: She is alert and oriented to person, place, and time.  Psychiatric:  Comments: awake and answers questions appropriately.     ED Results / Procedures / Treatments   Labs (all labs ordered are listed, but only abnormal results are displayed) Labs Reviewed  BASIC METABOLIC PANEL - Abnormal; Notable for the following components:      Result Value   CO2 17 (*)    Glucose, Bld 111 (*)    Anion gap 18 (*)    All other components within normal limits  CBC - Abnormal; Notable for the following components:   WBC 14.8 (*)    RBC 5.21 (*)    Hemoglobin 11.9 (*)    MCV 75.4 (*)    MCH 22.8 (*)    RDW 16.6 (*)    Platelets 473 (*)    All other components within normal limits  ETHANOL - Abnormal; Notable for the following components:   Alcohol, Ethyl (B) 108 (*)    All other components within normal limits  HEPATIC FUNCTION PANEL  I-STAT BETA HCG BLOOD, ED ([facility], [facility], [facility] ONLY)    EKG EKG Interpretation  Date/Time:  Friday March 03 2020 16:31:04 EDT Ventricular Rate:  119 PR Interval:    QRS Duration: 74 QT Interval:  322 QTC Calculation: 453 R Axis:   -34 Text Interpretation: Sinus tachycardia Left axis deviation Low voltage, precordial leads Consider anterior infarct Confirmed by Benjiman Core [phone]) on 03/03/2020 7:06:11 PM   Radiology CT Head Wo Contrast  Result Date: 03/03/2020 CLINICAL DATA:  Head trauma, altered mental status. Fall after dizzy spell this morning. EXAM: CT HEAD WITHOUT CONTRAST TECHNIQUE:  Contiguous axial images were obtained from the base of the skull through the vertex without intravenous contrast. COMPARISON:  MR head 07/01/2017, CT head 06/30/2017. FINDINGS: Brain: Cerebral ventricle sizes are concordant with the degree of cerebral volume loss. No evidence of large-territorial acute infarction. No parenchymal hemorrhage. No mass lesion. No extra-axial collection. No mass effect or midline shift. No hydrocephalus. Basilar cisterns are patent. Vascular: No hyperdense vessel. Skull: No acute fracture or focal lesion. Sinuses/Orbits: Paranasal sinuses and mastoid air cells are clear. The orbits are unremarkable. Other: Interval development of a 7 mm left frontal subgaleal hematoma. IMPRESSION: 1. No acute intracranial abnormality. 2. Left 7 mm frontal subgaleal hematoma with no underlying calvarial fracture. Electronically Signed   By: Tish Frederickson M.D.   On: 03/03/2020 17:14    Procedures Procedures (including critical care time)  Medications Ordered in ED Medications  QUEtiapine (SEROQUEL) tablet 25 mg (25 mg Oral Given 03/03/20 2120)  LORazepam (ATIVAN) tablet 1 mg (1 mg Oral Given 03/03/20 1642)  promethazine (PHENERGAN) injection 12.5 mg (12.5 mg Intravenous Given 03/03/20 1943)  sodium chloride 0.9 % bolus 1,000 mL (0 mLs Intravenous Stopped 03/03/20 2120)  thiamine (B-1) injection 100 mg (100 mg Intravenous Given 03/03/20 1943)  LORazepam (ATIVAN) injection 1 mg (1 mg Intravenous Given 03/03/20 2120)    ED Course  I have reviewed the triage vital signs and the nursing notes.  Pertinent labs & imaging results that were available during my care of the patient were reviewed by me and considered in my medical decision making (see chart for details).    MDM Rules/Calculators/A&P                          Patient presented from behavioral health.  Had a fall where she hit her head.  Had some dizziness.  Tachycardia.  Nausea.  Felt better eventually after treatment although took  antiemetics and Ativan.  Tachycardia is improved after treatment.  Has some dehydration versus alcohol effects on her blood work.  Fluid bolus had been given.  Had been having suicidal thoughts but no active plan.  Plan at behavioral health was for her to go to behavioral health urgent care, however patient refuses this.  Do not think with her mild suicidal thoughts without plan there is enough criteria to involuntary commit her.  Discharge home. Final Clinical Impression(s) / ED Diagnoses Final diagnoses:  Injury of head, initial encounter  Fall, initial encounter  Alcohol use  Suicidal ideations    Rx / DC Orders ED Discharge Orders    None       Benjiman CorePickering, Addyson Traub, MD 03/03/20 2202

## 2020-03-03 NOTE — Discharge Instructions (Signed)
Follow-up at the [facility] behavioral health urgent care.

## 2020-03-03 NOTE — ED Notes (Signed)
Ambulated to restroom. Tachycardic at 140bpm. 97% on RA. Patient talking in full sentences. Patient complaining of headache, dizziness, and nausea stating medication didn't help. MD aware.

## 2020-03-03 NOTE — H&P (Signed)
Behavioral Health Medical Screening Exam  Marilyn Fowler is an 44 y.o. female.  Total Time spent with patient: 30 minutes  Psychiatric Specialty Exam: Physical Exam Vitals and nursing note reviewed.  Constitutional:      Appearance: Normal appearance.  HENT:     Head: Normocephalic.     Nose: Nose normal.  Pulmonary:     Effort: Pulmonary effort is normal.  Musculoskeletal:        General: Normal range of motion.     Cervical back: Normal range of motion.  Neurological:     General: No focal deficit present.     Mental Status: She is alert and oriented to person, place, and time.  Psychiatric:        Attention and Perception: Attention and perception normal.        Mood and Affect: Mood is depressed.        Speech: Speech normal.        Behavior: Behavior normal. Behavior is cooperative.        Thought Content: Thought content includes suicidal ideation.        Cognition and Memory: Cognition normal.        Judgment: Judgment normal.    Review of Systems  Psychiatric/Behavioral: Positive for dysphoric mood and suicidal ideas.  All other systems reviewed and are negative.  There were no vitals taken for this visit.There is no height or weight on file to calculate BMI. General Appearance: Casual Eye Contact:  Good Speech:  Normal Rate Volume:  Normal Mood:  Dysphoric Affect:  Congruent Thought Process:  Coherent and Descriptions of Associations: Intact Orientation:  Full (Time, Place, and Person) Thought Content:  WDL and Logical Suicidal Thoughts:  Yes.  without intent/plan Homicidal Thoughts:  No Memory:  Immediate;   Good Recent;   Good Remote;   Good Judgement:  Fair Insight:  Fair Psychomotor Activity:  Normal Concentration: Concentration: Good and Attention Span: Good Recall:  Good Fund of Knowledge:Good Language: Good Akathisia:  No Handed:  Right AIMS (if indicated):    Assets:  Housing Leisure Time Physical Health Resilience Social  Support Sleep:     Musculoskeletal: Strength & Muscle Tone: within normal limits Gait & Station: normal Patient leans: N/A  Vital Signs 99.1, 122, 16, 164/88, O2 99  Recommendations: Based on my evaluation the patient does not appear to have an emergency medical condition.  Transfer to [facility] ED for medical clearance prior to transferring to the [facility], NP 03/03/2020, 2:55 PM

## 2020-03-19 ENCOUNTER — Encounter (HOSPITAL_BASED_OUTPATIENT_CLINIC_OR_DEPARTMENT_OTHER): Payer: Self-pay | Admitting: Emergency Medicine

## 2020-03-19 ENCOUNTER — Other Ambulatory Visit: Payer: Self-pay

## 2020-03-19 ENCOUNTER — Emergency Department (HOSPITAL_BASED_OUTPATIENT_CLINIC_OR_DEPARTMENT_OTHER)
Admission: EM | Admit: 2020-03-19 | Discharge: 2020-03-19 | Disposition: A | Discharge status: Home / Self Care | Payer: Self-pay | Attending: Emergency Medicine | Admitting: Emergency Medicine

## 2020-03-19 DIAGNOSIS — F10930 Alcohol use, unspecified with withdrawal, uncomplicated: Secondary | ICD-10-CM

## 2020-03-19 DIAGNOSIS — Z87891 Personal history of nicotine dependence: Secondary | ICD-10-CM | POA: Insufficient documentation

## 2020-03-19 DIAGNOSIS — F10230 Alcohol dependence with withdrawal, uncomplicated: Secondary | ICD-10-CM

## 2020-03-19 DIAGNOSIS — I10 Essential (primary) hypertension: Secondary | ICD-10-CM | POA: Insufficient documentation

## 2020-03-19 DIAGNOSIS — Z79899 Other long term (current) drug therapy: Secondary | ICD-10-CM | POA: Insufficient documentation

## 2020-03-19 DIAGNOSIS — F10239 Alcohol dependence with withdrawal, unspecified: Secondary | ICD-10-CM | POA: Insufficient documentation

## 2020-03-19 LAB — CBC WITH DIFFERENTIAL/PLATELET
Abs Immature Granulocytes: 0.03 10*3/uL (ref 0.00–0.07)
Basophils Absolute: 0.1 10*3/uL (ref 0.0–0.1)
Basophils Relative: 1 %
Eosinophils Absolute: 0.1 10*3/uL (ref 0.0–0.5)
Eosinophils Relative: 2 %
HCT: 32.9 % — ABNORMAL LOW (ref 36.0–46.0)
Hemoglobin: 10.1 g/dL — ABNORMAL LOW (ref 12.0–15.0)
Immature Granulocytes: 0 %
Lymphocytes Relative: 21 %
Lymphs Abs: 1.5 10*3/uL (ref 0.7–4.0)
MCH: 23.2 pg — ABNORMAL LOW (ref 26.0–34.0)
MCHC: 30.7 g/dL (ref 30.0–36.0)
MCV: 75.6 fL — ABNORMAL LOW (ref 80.0–100.0)
Monocytes Absolute: 0.6 10*3/uL (ref 0.1–1.0)
Monocytes Relative: 8 %
Neutro Abs: 4.7 10*3/uL (ref 1.7–7.7)
Neutrophils Relative %: 68 %
Platelets: 297 10*3/uL (ref 150–400)
RBC: 4.35 MIL/uL (ref 3.87–5.11)
RDW: 16.1 % — ABNORMAL HIGH (ref 11.5–15.5)
WBC: 7 10*3/uL (ref 4.0–10.5)
nRBC: 0 % (ref 0.0–0.2)

## 2020-03-19 LAB — COMPREHENSIVE METABOLIC PANEL
ALT: 21 U/L (ref 0–44)
AST: 24 U/L (ref 15–41)
Albumin: 3.6 g/dL (ref 3.5–5.0)
Alkaline Phosphatase: 69 U/L (ref 38–126)
Anion gap: 11 (ref 5–15)
BUN: 10 mg/dL (ref 6–20)
CO2: 21 mmol/L — ABNORMAL LOW (ref 22–32)
Calcium: 8.4 mg/dL — ABNORMAL LOW (ref 8.9–10.3)
Chloride: 107 mmol/L (ref 98–111)
Creatinine, Ser: 0.79 mg/dL (ref 0.44–1.00)
GFR, Estimated: >60 mL/min (ref >60)
Glucose, Bld: 118 mg/dL — ABNORMAL HIGH (ref 70–99)
Potassium: 3.4 mmol/L — ABNORMAL LOW (ref 3.5–5.1)
Sodium: 139 mmol/L (ref 135–145)
Total Bilirubin: 0.2 mg/dL — ABNORMAL LOW (ref 0.3–1.2)
Total Protein: 6.9 g/dL (ref 6.5–8.1)

## 2020-03-19 LAB — LIPASE, BLOOD: Lipase: 25 U/L (ref 11–51)

## 2020-03-19 MED ORDER — THIAMINE HCL 100 MG/ML IJ SOLN
100.0000 mg | Freq: Every day | INTRAMUSCULAR | Status: DC
  Administered 2020-03-19: 100 mg via INTRAVENOUS
  Filled 2020-03-19: qty 2

## 2020-03-19 MED ORDER — LORAZEPAM 2 MG/ML IJ SOLN: 1.0000 mg | Freq: Once | INTRAMUSCULAR | Status: DC

## 2020-03-19 MED ORDER — LORAZEPAM 2 MG/ML IJ SOLN: 0.0000 mg | Freq: Two times a day (BID) | INTRAMUSCULAR | Status: DC

## 2020-03-19 MED ORDER — LORAZEPAM 2 MG/ML IJ SOLN
0.0000 mg | Freq: Four times a day (QID) | INTRAMUSCULAR | Status: DC
  Administered 2020-03-19: 2 mg via INTRAVENOUS
  Filled 2020-03-19: qty 1

## 2020-03-19 MED ORDER — THIAMINE HCL 100 MG PO TABS: 100.0000 mg | ORAL_TABLET | Freq: Every day | ORAL | Status: DC

## 2020-03-19 MED ORDER — LORAZEPAM 1 MG PO TABS: 0.0000 mg | ORAL_TABLET | Freq: Four times a day (QID) | ORAL | Status: DC

## 2020-03-19 MED ORDER — SODIUM CHLORIDE 0.9 % IV BOLUS
1000.0000 mL | Freq: Once | INTRAVENOUS | Status: AC
  Administered 2020-03-19: 1000 mL via INTRAVENOUS

## 2020-03-19 MED ORDER — ONDANSETRON HCL 4 MG/2ML IJ SOLN
4.0000 mg | Freq: Once | INTRAMUSCULAR | Status: AC
  Administered 2020-03-19: 4 mg via INTRAVENOUS
  Filled 2020-03-19: qty 2

## 2020-03-19 MED ORDER — LORAZEPAM 1 MG PO TABS: 0.0000 mg | ORAL_TABLET | Freq: Two times a day (BID) | ORAL | Status: DC

## 2020-03-19 MED ORDER — POTASSIUM CHLORIDE CRYS ER 20 MEQ PO TBCR
40.0000 meq | EXTENDED_RELEASE_TABLET | Freq: Once | ORAL | Status: AC
  Administered 2020-03-19: 40 meq via ORAL
  Filled 2020-03-19: qty 2

## 2020-03-19 NOTE — ED Triage Notes (Signed)
Pt c/o migraine with nausea since waking up at 3 am. Pt also reports going through DTs from alcohol use. Last use was 1 am, vodka unknown amount. Pt has been drinking since Friday.

## 2020-03-19 NOTE — Discharge Instructions (Addendum)
Please follow up with your primary care provider within 5-7 days for re-evaluation of your symptoms. If you do not have a primary care provider, information for a healthcare clinic has been provided for you to make arrangements for follow up care. Please return to the emergency department for any new or worsening symptoms. ° °

## 2020-03-19 NOTE — ED Provider Notes (Signed)
[facility] EMERGENCY DEPARTMENT Provider Note   CSN: [id number] Arrival date & time: 03/19/20  1022     History Chief Complaint  Patient presents with  . Migraine    Marilyn Fowler is a 44 y.o. female.  HPI   Pt is a 44 y/o female with a h/o ETOH abuse, anemia, anxiety, depression, HTN, migraines, who presents to the ED today for eval of ETOH withdrawal. States that she started drinking ETOH a few days ago. She states she does not know how much she has been drinking this weekend. States that her last drink was at 1am. She notes that she started feeling lightheaded and shaking earlier today. She also reports nausea and a headache. She denies any vomiting.  Denies diarrhea, abd pain, diaphoresis, confusion, tactile hallucinations, visual hallucination.   Reports a headache and anxiety but states she has not taken her medications this morning.   Denies a h/o complicated ETOH withdrawal. Denies SI.  Past Medical History:  Diagnosis Date  . Alcohol abuse   . Anemia   . Anxiety   . Blood transfusion without reported diagnosis   . Depression   . Hemorrhoid   . Hypertension   . Migraine     Patient Active Problem List   Diagnosis Date Noted  . Sprained ankle 01/14/2020  . Bipolar depression (HCC) 01/13/2020  . Physical exam 08/07/2017  . Headache 06/30/2017  . Chronic right shoulder pain 09/04/2016  . Hypokalemia 04/30/2016  . Hypomagnesemia 04/30/2016  . Alcoholic hepatitis 04/30/2016  . Alcohol induced fatty liver 04/30/2016  . Alcohol abuse 04/29/2016  . Left medial knee pain 06/29/2015  . Acute bacterial sinusitis 10/11/2014  . Maxillary sinusitis, acute 09/14/2014  . Gastroenteritis 11/09/2013  . Pleuritic chest pain 09/17/2013  . Chest pain 11/18/2012  . Morbid obesity (HCC) 09/29/2012  . HTN (hypertension) 07/12/2012  . Migraine 07/12/2012  . Bipolar II disorder, most recent episode major depressive (HCC) 07/11/2012    Past Surgical History:    Procedure Laterality Date  . HEMORRHOID SURGERY    . HERNIA REPAIR    . TONSILLECTOMY    . VENTRAL HERNIA REPAIR       OB History   No obstetric history on file.     Family History  Problem Relation Age of Onset  . Osteoarthritis Mother   . Diabetes Mother   . Hypertension Mother   . Depression Mother   . Hypertension Father   . Diabetes Father   . Asthma Son   . Deep vein thrombosis Maternal Grandmother   . Deep vein thrombosis Maternal Grandfather   . Stroke Neg Hx     Social History   Tobacco Use  . Smoking status: Former Smoker    Years: 10.00  . Smokeless tobacco: Never Used  . Tobacco comment: smoked 1994-2012, up to 1 pp WEEK  Vaping Use  . Vaping Use: Never used  Substance Use Topics  . Alcohol use: Yes    Comment: Reports "I am an alcoholic"  . Drug use: No    Home Medications Prior to Admission medications   Medication Sig Start Date End Date Taking? Authorizing Provider  amLODipine (NORVASC) 2.5 MG tablet Take 1 tablet (2.5 mg total) by mouth daily. 01/19/20   Clapacs, Jackquline Denmark, MD  Ascorbic Acid (VITAMIN C PO) Take 1 tablet by mouth daily.    [provider]  buPROPion (WELLBUTRIN XL) 300 MG 24 hr tablet Take 1 tablet (300 mg total) by mouth daily. 01/19/20  Clapacs, Jackquline DenmarkJohn T, MD  Cholecalciferol (VITAMIN D3 PO) Take 1 tablet by mouth daily.    [provider]  hydrOXYzine (ATARAX/VISTARIL) 25 MG tablet Take 1 tablet (25 mg total) by mouth every 4 (four) hours as needed for anxiety. 01/18/20   Clapacs, Jackquline DenmarkJohn T, MD  ibuprofen (ADVIL) 200 MG tablet Take 400 mg by mouth every 4 (four) hours as needed for headache (pain).    [provider]  metoprolol succinate (TOPROL-XL) 25 MG 24 hr tablet Take 1 tablet (25 mg total) by mouth daily. 01/19/20   Clapacs, Jackquline DenmarkJohn T, MD  naproxen (NAPROSYN) 375 MG tablet Take 1 tablet (375 mg total) by mouth 2 (two) times daily as needed for moderate pain. Patient not taking: Reported on 03/03/2020 01/18/20    Clapacs, Jackquline DenmarkJohn T, MD  phenazopyridine (PYRIDIUM) 200 MG tablet Take 1 tablet (200 mg total) by mouth 3 (three) times daily as needed (urinary discomfort). Patient not taking: Reported on 03/03/2020 01/18/20   Clapacs, Jackquline DenmarkJohn T, MD  QUEtiapine (SEROQUEL) 25 MG tablet Take 1 tablet (25 mg total) by mouth 2 (two) times daily. Patient taking differently: Take 25 mg by mouth See admin instructions. Take one tablet (25 mg) by mouth twice daily - morning and afternoon 01/18/20   Clapacs, Jackquline DenmarkJohn T, MD  QUEtiapine (SEROQUEL) 300 MG tablet Take 1 tablet (300 mg total) by mouth at bedtime. 01/18/20   Clapacs, Jackquline DenmarkJohn T, MD  traZODone (DESYREL) 100 MG tablet Take 2 tablets (200 mg total) by mouth at bedtime. 01/18/20   Clapacs, Jackquline DenmarkJohn T, MD    Allergies    Methocarbamol and Toradol [ketorolac tromethamine]  Review of Systems   Review of Systems  Constitutional: Negative for fever.  HENT: Negative for ear pain and sore throat.   Eyes: Negative for visual disturbance.  Respiratory: Negative for cough and shortness of breath.   Cardiovascular: Negative for chest pain.  Gastrointestinal: Positive for nausea. Negative for abdominal pain, constipation, diarrhea and vomiting.  Genitourinary: Negative for dysuria and hematuria.  Musculoskeletal: Negative for back pain.  Skin: Negative for rash.  Neurological: Positive for tremors, light-headedness and headaches. Negative for seizures and syncope.  Psychiatric/Behavioral: Negative for hallucinations and suicidal ideas. The patient is nervous/anxious.   All other systems reviewed and are negative.   Physical Exam Updated Vital Signs BP (!) 118/102 (BP Location: Right Arm)   Pulse (!) 101   Temp 97.9 F (36.6 C) (Oral)   Resp 20   Ht 5' (1.524 m)   Wt 129.3 kg   LMP 03/19/2020   SpO2 100%   BMI 55.66 kg/m   Physical Exam Vitals and nursing note reviewed.  Constitutional:      General: She is not in acute distress.    Appearance: She is well-developed.    HENT:     Head: Normocephalic and atraumatic.  Eyes:     Conjunctiva/sclera: Conjunctivae normal.  Cardiovascular:     Rate and Rhythm: Regular rhythm. Tachycardia present.     Heart sounds: Normal heart sounds. No murmur heard.   Pulmonary:     Effort: Pulmonary effort is normal. No respiratory distress.     Breath sounds: Normal breath sounds. No wheezing, rhonchi or rales.  Abdominal:     General: Bowel sounds are normal.     Palpations: Abdomen is soft.     Tenderness: There is no abdominal tenderness. There is no guarding or rebound.  Musculoskeletal:     Cervical back: Neck supple.  Skin:  General: Skin is warm and dry.  Neurological:     Mental Status: She is alert.     Comments: Clear speech, no facial droop, moving all extremities wnl.  Psychiatric:     Comments: Appears anxious     ED Results / Procedures / Treatments   Labs (all labs ordered are listed, but only abnormal results are displayed) Labs Reviewed  CBC WITH DIFFERENTIAL/PLATELET - Abnormal; Notable for the following components:      Result Value   Hemoglobin 10.1 (*)    HCT 32.9 (*)    MCV 75.6 (*)    MCH 23.2 (*)    RDW 16.1 (*)    All other components within normal limits  COMPREHENSIVE METABOLIC PANEL - Abnormal; Notable for the following components:   Potassium 3.4 (*)    CO2 21 (*)    Glucose, Bld 118 (*)    Calcium 8.4 (*)    Total Bilirubin 0.2 (*)    All other components within normal limits  LIPASE, BLOOD  PREGNANCY, URINE    EKG None  Radiology No results found.  Procedures Procedures (including critical care time)  Medications Ordered in ED Medications  LORazepam (ATIVAN) injection 0-4 mg (2 mg Intravenous Given 03/19/20 1129)    Or  LORazepam (ATIVAN) tablet 0-4 mg ( Oral See Alternative 03/19/20 1129)  LORazepam (ATIVAN) injection 0-4 mg (has no administration in time range)    Or  LORazepam (ATIVAN) tablet 0-4 mg (has no administration in time range)  thiamine  tablet 100 mg ( Oral See Alternative 03/19/20 1129)    Or  thiamine (B-1) injection 100 mg (100 mg Intravenous Given 03/19/20 1129)  sodium chloride 0.9 % bolus 1,000 mL (0 mLs Intravenous Stopped 03/19/20 1219)  ondansetron (ZOFRAN) injection 4 mg (4 mg Intravenous Given 03/19/20 1130)  potassium chloride SA (KLOR-CON) CR tablet 40 mEq (40 mEq Oral Given 03/19/20 1203)    ED Course  I have reviewed the triage vital signs and the nursing notes.  Pertinent labs & imaging results that were available during my care of the patient were reviewed by me and considered in my medical decision making (see chart for details).    MDM Rules/Calculators/A&P                          44 year old female presenting the emergency department today for evaluation of alcohol withdrawal symptoms.  States she has been binge drinking for the weekend and started feeling nauseated, lightheaded and shaky this morning so she came to the ED.  Reviewed/interpreted labs CBC is without leukocytosis, does show mild anemia CMP shows mild hypokalemia but otherwise reassuring. Lipase is negative Urine pregnancy test  EKG shows sinus tachycardia, borderline left axis deviation, low voltage in the precordial leads and borderline T wave abnormalities in the anterior leads.  Baseline wander in lead III.  Patient was given IV fluids, Zofran, Tylenol, potassium and Ativan.  Her initial CIWA was 18.  Repeat CIWA was 2.  On reassessment she feels much improved. Her nausea and headache has improved. She has tolerated po. She no longer feels tremulous. sxs consistent with mild etoh withdrawal, she does not appear to be in DTs and appears appropriate for discharge. Pt has h/o overdosing on benzos 5 months ago therefore I have hesitation about sending her home with a librium taper. Peer support consulted. Recommended outpt fu and strict return precautions. She voices understanding of the plan and reasons to return.  All questions  answered, pt stable for discharge.    Final Clinical Impression(s) / ED Diagnoses Final diagnoses:  Alcohol withdrawal syndrome without complication [facility])    Rx / DC Orders ED Discharge Orders         Ordered    Consult to Peer Support       Provider:  (Not yet assigned)   03/19/20 1233           Karrie Meres, PA-C 03/19/20 1234    Arby Barrette, MD 03/19/20 1445

## 2020-03-21 ENCOUNTER — Other Ambulatory Visit: Payer: Self-pay | Admitting: Psychiatry

## 2020-03-23 ENCOUNTER — Other Ambulatory Visit: Payer: Self-pay

## 2020-03-23 ENCOUNTER — Emergency Department (HOSPITAL_BASED_OUTPATIENT_CLINIC_OR_DEPARTMENT_OTHER)
Admission: EM | Admit: 2020-03-23 | Discharge: 2020-03-23 | Disposition: A | Discharge status: Home / Self Care | Payer: Self-pay | Attending: Emergency Medicine | Admitting: Emergency Medicine

## 2020-03-23 ENCOUNTER — Encounter (HOSPITAL_BASED_OUTPATIENT_CLINIC_OR_DEPARTMENT_OTHER): Payer: Self-pay | Admitting: Emergency Medicine

## 2020-03-23 DIAGNOSIS — R112 Nausea with vomiting, unspecified: Secondary | ICD-10-CM

## 2020-03-23 DIAGNOSIS — R519 Headache, unspecified: Secondary | ICD-10-CM | POA: Insufficient documentation

## 2020-03-23 DIAGNOSIS — R000 Tachycardia, unspecified: Secondary | ICD-10-CM | POA: Insufficient documentation

## 2020-03-23 DIAGNOSIS — Z87891 Personal history of nicotine dependence: Secondary | ICD-10-CM | POA: Insufficient documentation

## 2020-03-23 DIAGNOSIS — I10 Essential (primary) hypertension: Secondary | ICD-10-CM | POA: Insufficient documentation

## 2020-03-23 DIAGNOSIS — Z79899 Other long term (current) drug therapy: Secondary | ICD-10-CM | POA: Insufficient documentation

## 2020-03-23 DIAGNOSIS — E860 Dehydration: Secondary | ICD-10-CM | POA: Insufficient documentation

## 2020-03-23 LAB — CBC WITH DIFFERENTIAL/PLATELET
Abs Immature Granulocytes: 0.07 10*3/uL (ref 0.00–0.07)
Basophils Absolute: 0 10*3/uL (ref 0.0–0.1)
Basophils Relative: 1 %
Eosinophils Absolute: 0.2 10*3/uL (ref 0.0–0.5)
Eosinophils Relative: 2 %
HCT: 31.8 % — ABNORMAL LOW (ref 36.0–46.0)
Hemoglobin: 9.8 g/dL — ABNORMAL LOW (ref 12.0–15.0)
Immature Granulocytes: 1 %
Lymphocytes Relative: 32 %
Lymphs Abs: 2 10*3/uL (ref 0.7–4.0)
MCH: 23.1 pg — ABNORMAL LOW (ref 26.0–34.0)
MCHC: 30.8 g/dL (ref 30.0–36.0)
MCV: 74.8 fL — ABNORMAL LOW (ref 80.0–100.0)
Monocytes Absolute: 0.5 10*3/uL (ref 0.1–1.0)
Monocytes Relative: 8 %
Neutro Abs: 3.5 10*3/uL (ref 1.7–7.7)
Neutrophils Relative %: 56 %
Platelets: 307 10*3/uL (ref 150–400)
RBC: 4.25 MIL/uL (ref 3.87–5.11)
RDW: 16.1 % — ABNORMAL HIGH (ref 11.5–15.5)
WBC: 6.2 10*3/uL (ref 4.0–10.5)
nRBC: 0 % (ref 0.0–0.2)

## 2020-03-23 LAB — COMPREHENSIVE METABOLIC PANEL
ALT: 20 U/L (ref 0–44)
AST: 23 U/L (ref 15–41)
Albumin: 3.7 g/dL (ref 3.5–5.0)
Alkaline Phosphatase: 80 U/L (ref 38–126)
Anion gap: 10 (ref 5–15)
BUN: 12 mg/dL (ref 6–20)
CO2: 21 mmol/L — ABNORMAL LOW (ref 22–32)
Calcium: 8.5 mg/dL — ABNORMAL LOW (ref 8.9–10.3)
Chloride: 106 mmol/L (ref 98–111)
Creatinine, Ser: 0.74 mg/dL (ref 0.44–1.00)
GFR, Estimated: >60 mL/min (ref >60)
Glucose, Bld: 109 mg/dL — ABNORMAL HIGH (ref 70–99)
Potassium: 3.8 mmol/L (ref 3.5–5.1)
Sodium: 137 mmol/L (ref 135–145)
Total Bilirubin: 0.4 mg/dL (ref 0.3–1.2)
Total Protein: 7.1 g/dL (ref 6.5–8.1)

## 2020-03-23 LAB — LIPASE, BLOOD: Lipase: 22 U/L (ref 11–51)

## 2020-03-23 MED ORDER — LORAZEPAM 2 MG/ML IJ SOLN
2.0000 mg | Freq: Once | INTRAMUSCULAR | Status: AC
  Administered 2020-03-23: 2 mg via INTRAVENOUS
  Filled 2020-03-23: qty 1

## 2020-03-23 MED ORDER — SODIUM CHLORIDE 0.9 % IV BOLUS
1000.0000 mL | Freq: Once | INTRAVENOUS | Status: AC
  Administered 2020-03-23: 1000 mL via INTRAVENOUS

## 2020-03-23 MED ORDER — ONDANSETRON HCL 4 MG/2ML IJ SOLN
4.0000 mg | Freq: Once | INTRAMUSCULAR | Status: AC
  Administered 2020-03-23: 4 mg via INTRAVENOUS
  Filled 2020-03-23: qty 2

## 2020-03-23 NOTE — ED Triage Notes (Addendum)
States has stopped drinking and has been vomiting wants to go to rehab denies SI and HI states was seen here recently for same given fluids and ativan   States holidays are hard

## 2020-03-23 NOTE — ED Provider Notes (Signed)
[facility] EMERGENCY DEPARTMENT Provider Note   CSN: [id number] Arrival date & time: 03/23/20  [id number]     History Chief Complaint  Patient presents with  . Emesis    [name] is a 44 y.o. female.  The history is provided by the patient and medical records. No language interpreter was used.  Emesis Severity:  Moderate Duration:  1 day Timing:  Intermittent Quality:  Stomach contents Progression:  Unchanged Chronicity:  Recurrent Recent urination:  Normal Relieved by:  Nothing Worsened by:  Nothing Ineffective treatments:  None tried Associated symptoms: headaches   Associated symptoms: no abdominal pain, no chills, no cough, no diarrhea, no fever and no URI   Risk factors: alcohol use        Past Medical History:  Diagnosis Date  . Alcohol abuse   . Anemia   . Anxiety   . Blood transfusion without reported diagnosis   . Depression   . Hemorrhoid   . Hypertension   . Migraine     Patient Active Problem List   Diagnosis Date Noted  . Sprained ankle 01/14/2020  . Bipolar depression (HCC) 01/13/2020  . Physical exam 08/07/2017  . Headache 06/30/2017  . Chronic right shoulder pain 09/04/2016  . Hypokalemia 04/30/2016  . Hypomagnesemia 04/30/2016  . Alcoholic hepatitis 04/30/2016  . Alcohol induced fatty liver 04/30/2016  . Alcohol abuse 04/29/2016  . Left medial knee pain 06/29/2015  . Acute bacterial sinusitis 10/11/2014  . Maxillary sinusitis, acute 09/14/2014  . Gastroenteritis 11/09/2013  . Pleuritic chest pain 09/17/2013  . Chest pain 11/18/2012  . Morbid obesity (HCC) 09/29/2012  . HTN (hypertension) 07/12/2012  . Migraine 07/12/2012  . Bipolar II disorder, most recent episode major depressive (HCC) 07/11/2012    Past Surgical History:  Procedure Laterality Date  . HEMORRHOID SURGERY    . HERNIA REPAIR    . TONSILLECTOMY    . VENTRAL HERNIA REPAIR       OB History   No obstetric history on file.     Family History    Problem Relation Age of Onset  . Osteoarthritis Mother   . Diabetes Mother   . Hypertension Mother   . Depression Mother   . Hypertension Father   . Diabetes Father   . Asthma Son   . Deep vein thrombosis Maternal Grandmother   . Deep vein thrombosis Maternal Grandfather   . Stroke Neg Hx     Social History   Tobacco Use  . Smoking status: Former Smoker    Years: 10.00  . Smokeless tobacco: Never Used  . Tobacco comment: smoked 1994-2012, up to 1 pp WEEK  Vaping Use  . Vaping Use: Never used  Substance Use Topics  . Alcohol use: Yes    Comment: Reports "I am an alcoholic"  . Drug use: No    Home Medications Prior to Admission medications   Medication Sig Start Date End Date Taking? Authorizing Provider  amLODipine (NORVASC) 2.5 MG tablet Take 1 tablet (2.5 mg total) by mouth daily. 01/19/20  Yes Clapacs, Jackquline DenmarkJohn T, MD  Ascorbic Acid (VITAMIN C PO) Take 1 tablet by mouth daily.   Yes [provider]  buPROPion (WELLBUTRIN XL) 300 MG 24 hr tablet Take 1 tablet (300 mg total) by mouth daily. 01/19/20  Yes Clapacs, Jackquline DenmarkJohn T, MD  hydrOXYzine (ATARAX/VISTARIL) 25 MG tablet Take 1 tablet (25 mg total) by mouth every 4 (four) hours as needed for anxiety. 01/18/20  Yes Clapacs, Jackquline DenmarkJohn T, MD  ibuprofen (ADVIL) 200 MG tablet Take 400 mg by mouth every 4 (four) hours as needed for headache (pain).   Yes [provider]  metoprolol succinate (TOPROL-XL) 25 MG 24 hr tablet Take 1 tablet (25 mg total) by mouth daily. 01/19/20  Yes Clapacs, Jackquline Denmark, MD  QUEtiapine (SEROQUEL) 25 MG tablet Take 1 tablet (25 mg total) by mouth 2 (two) times daily. Patient taking differently: Take 25 mg by mouth See admin instructions. Take one tablet (25 mg) by mouth twice daily - morning and afternoon 01/18/20  Yes Clapacs, Jackquline Denmark, MD  QUEtiapine (SEROQUEL) 300 MG tablet Take 1 tablet (300 mg total) by mouth at bedtime. 01/18/20  Yes Clapacs, Jackquline Denmark, MD  traZODone (DESYREL) 100 MG tablet Take 2 tablets  (200 mg total) by mouth at bedtime. 01/18/20  Yes Clapacs, Jackquline Denmark, MD  Cholecalciferol (VITAMIN D3 PO) Take 1 tablet by mouth daily.    [provider]  naproxen (NAPROSYN) 375 MG tablet Take 1 tablet (375 mg total) by mouth 2 (two) times daily as needed for moderate pain. Patient not taking: Reported on 03/03/2020 01/18/20   Clapacs, Jackquline Denmark, MD  phenazopyridine (PYRIDIUM) 200 MG tablet Take 1 tablet (200 mg total) by mouth 3 (three) times daily as needed (urinary discomfort). Patient not taking: Reported on 03/03/2020 01/18/20   Clapacs, Jackquline Denmark, MD    Allergies    Methocarbamol and Toradol [ketorolac tromethamine]  Review of Systems   Review of Systems  Constitutional: Positive for fatigue. Negative for chills, diaphoresis and fever.  HENT: Negative for congestion.   Eyes: Positive for photophobia. Negative for visual disturbance.  Respiratory: Negative for cough, chest tightness, shortness of breath and wheezing.   Cardiovascular: Negative for chest pain and palpitations.  Gastrointestinal: Positive for nausea and vomiting. Negative for abdominal pain, constipation and diarrhea.  Genitourinary: Negative for flank pain and frequency.  Musculoskeletal: Negative for back pain and neck pain.  Skin: Negative for rash.  Neurological: Positive for headaches. Negative for dizziness, syncope, weakness, light-headedness and numbness.  Psychiatric/Behavioral: Negative for agitation and confusion.  All other systems reviewed and are negative.   Physical Exam Updated Vital Signs BP (!) 150/97 (BP Location: Right Arm)   Pulse (!) 107   Temp 98.1 F (36.7 C) (Oral)   Resp 18   Ht 5' (1.524 m)   Wt 129.3 kg   LMP 03/19/2020   SpO2 97%   BMI 55.66 kg/m   Physical Exam Vitals and nursing note reviewed.  Constitutional:      General: She is not in acute distress.    Appearance: She is well-developed. She is not ill-appearing, toxic-appearing or diaphoretic.  HENT:     Head:  Normocephalic and atraumatic.     Right Ear: External ear normal.     Left Ear: External ear normal.     Nose: Nose normal. No congestion or rhinorrhea.     Mouth/Throat:     Mouth: Mucous membranes are dry.     Pharynx: No oropharyngeal exudate.  Eyes:     Extraocular Movements: Extraocular movements intact.     Conjunctiva/sclera: Conjunctivae normal.     Pupils: Pupils are equal, round, and reactive to light.  Cardiovascular:     Rate and Rhythm: Regular rhythm. Tachycardia present.     Pulses: Normal pulses.     Heart sounds: No murmur heard.   Pulmonary:     Effort: Pulmonary effort is normal. No respiratory distress.     Breath  sounds: No stridor. No wheezing, rhonchi or rales.  Chest:     Chest wall: No tenderness.  Abdominal:     General: Abdomen is flat. There is no distension.     Tenderness: There is no abdominal tenderness. There is no right CVA tenderness, left CVA tenderness, guarding or rebound.  Musculoskeletal:        General: No tenderness.     Cervical back: Normal range of motion and neck supple. No tenderness.  Skin:    General: Skin is warm.     Capillary Refill: Capillary refill takes less than 2 seconds.     Coloration: Skin is not pale.     Findings: No erythema or rash.  Neurological:     General: No focal deficit present.     Mental Status: She is alert and oriented to person, place, and time.     Motor: No abnormal muscle tone.     Deep Tendon Reflexes: Reflexes are normal and symmetric.  Psychiatric:        Mood and Affect: Mood normal.     ED Results / Procedures / Treatments   Labs (all labs ordered are listed, but only abnormal results are displayed) Labs Reviewed  COMPREHENSIVE METABOLIC PANEL - Abnormal; Notable for the following components:      Result Value   CO2 21 (*)    Glucose, Bld 109 (*)    Calcium 8.5 (*)    All other components within normal limits  CBC WITH DIFFERENTIAL/PLATELET - Abnormal; Notable for the following  components:   Hemoglobin 9.8 (*)    HCT 31.8 (*)    MCV 74.8 (*)    MCH 23.1 (*)    RDW 16.1 (*)    All other components within normal limits  LIPASE, BLOOD    EKG None  Radiology No results found.  Procedures Procedures (including critical care time)  Medications Ordered in ED Medications  sodium chloride 0.9 % bolus 1,000 mL (0 mLs Intravenous Stopped 03/23/20 1033)  ondansetron (ZOFRAN) injection 4 mg (4 mg Intravenous Given 03/23/20 0928)  LORazepam (ATIVAN) injection 2 mg (2 mg Intravenous Given 03/23/20 8315)    ED Course  I have reviewed the triage vital signs and the nursing notes.  Pertinent labs & imaging results that were available during my care of the patient were reviewed by me and considered in my medical decision making (see chart for details).    MDM Rules/Calculators/A&P                          Marilyn Fowler is a 44 y.o. female with a past medical history significant for migraines, hypertension, bipolar disorder, alcohol abuse, fatty liver disease, anxiety, depression who presents with nausea and vomiting and dehydration.  Patient reports that she was seen several days ago for similar symptoms and thinks she is dehydrated and having vomiting from alcohol.  She reports that she started drinking again recently and is going to check into rehab but she wants to get through Thanksgiving today before doing it this weekend.  She says that she drank again yesterday and started having nausea and vomiting most of the night.  She is not having significant abdominal pain but is having some mild headache.  She is denying hallucinations with vision, sound, or tactile.  She is worried that she is dehydrated from the vomiting and does not want to go into withdrawals today.  She denies any SI or HI.  Denies any other complaints.  She denies any cough, congestion, fevers, chills, abdominal pain, constipation, or diarrhea.  She denies urinary symptoms.  Her primary goal is to get  rehydrated, get to feeling better from the alcohol withdrawal, and manage her nausea.  She does say that she was hypokalemic several days ago and would like to have her electrolytes checked again.  She says that she wants to go to inpatient alcohol rehab in several days after the holiday.  On exam, lungs are clear and chest is nontender.  Abdomen is nontender.  Normal bowel sounds.  Patient moving all extremities.  She denies SI and HI.  She does appear dehydrated on exam with dry mucous membranes.  She does like tachycardic on arrival.  I do not think she is going into acute DTs but I do think she is dehydrated from her vomiting with alcohol use.  We will give her fluids, Zofran, and some Ativan which worked for several days ago.  We will recheck some labs.  Plan of care will be to discharge the patient if she is feeling better and there are no emergent discovery seen and she can use the resources she was given several days ago to go to alcohol rehab.  Patient agrees with plan, dissipate reassessment after work-up and rehydration.  10:50 AM Patient's work-up shows similar findings with improved potassium.  Hemoglobin slightly lower but otherwise there were no other critical abnormality seen.  Patient is feeling much better after fluids and Ativan.  She would like to go home now.  She will follow up with the PCP and utilize the outpatient resources she was given for alcohol abuse.  She understands return precautions and follow-up instructions and was discharged in good condition with resolved symptoms.   Final Clinical Impression(s) / ED Diagnoses Final diagnoses:  Dehydration  Non-intractable vomiting with nausea, unspecified vomiting type    Rx / DC Orders ED Discharge Orders    None     Clinical Impression: 1. Dehydration   2. Non-intractable vomiting with nausea, unspecified vomiting type     Disposition: Discharge  Condition: Good  I have discussed the results, Dx and Tx plan  with the pt(& family if present). He/she/they expressed understanding and agree(s) with the plan. Discharge instructions discussed at great length. Strict return precautions discussed and pt &/or family have verbalized understanding of the instructions. No further questions at time of discharge.    New Prescriptions   No medications on file    Follow Up: [facility] [place] [facility] [address] [id number] mc [address] [place] [address] [phone]    your outpatient resources for alcohol help        Vanetta Rule, Canary Brim, MD 03/23/20 1052

## 2020-03-23 NOTE — Discharge Instructions (Signed)
Your work-up today showed your potassium has improved.  You are dehydrated with the nausea and vomiting.  Please be careful to stay hydrated and utilize the resources you were given during your recent visit to continue getting help for your alcohol use.  If any symptoms change or worsen or you start to feel dehydrated again, please return to the nearest emergency department.

## 2020-03-25 ENCOUNTER — Other Ambulatory Visit: Payer: Self-pay | Admitting: Physician Assistant

## 2020-03-29 ENCOUNTER — Other Ambulatory Visit: Payer: Self-pay | Admitting: Psychiatry

## 2020-04-03 ENCOUNTER — Emergency Department (HOSPITAL_BASED_OUTPATIENT_CLINIC_OR_DEPARTMENT_OTHER)
Admission: EM | Admit: 2020-04-03 | Discharge: 2020-04-03 | Disposition: A | Discharge status: Home / Self Care | Payer: Self-pay | Attending: Emergency Medicine | Admitting: Emergency Medicine

## 2020-04-03 ENCOUNTER — Other Ambulatory Visit: Payer: Self-pay

## 2020-04-03 ENCOUNTER — Encounter (HOSPITAL_BASED_OUTPATIENT_CLINIC_OR_DEPARTMENT_OTHER): Payer: Self-pay | Admitting: Emergency Medicine

## 2020-04-03 DIAGNOSIS — F419 Anxiety disorder, unspecified: Secondary | ICD-10-CM | POA: Insufficient documentation

## 2020-04-03 DIAGNOSIS — R300 Dysuria: Secondary | ICD-10-CM

## 2020-04-03 DIAGNOSIS — I10 Essential (primary) hypertension: Secondary | ICD-10-CM | POA: Insufficient documentation

## 2020-04-03 DIAGNOSIS — Z87891 Personal history of nicotine dependence: Secondary | ICD-10-CM | POA: Insufficient documentation

## 2020-04-03 DIAGNOSIS — Z79899 Other long term (current) drug therapy: Secondary | ICD-10-CM | POA: Insufficient documentation

## 2020-04-03 LAB — URINALYSIS, ROUTINE W REFLEX MICROSCOPIC
Bilirubin Urine: NEGATIVE
Glucose, UA: NEGATIVE mg/dL
Hgb urine dipstick: NEGATIVE
Ketones, ur: NEGATIVE mg/dL
Leukocytes,Ua: NEGATIVE
Nitrite: NEGATIVE
Protein, ur: NEGATIVE mg/dL
Specific Gravity, Urine: <1.005 — ABNORMAL LOW (ref 1.005–1.030)
pH: 7 (ref 5.0–8.0)

## 2020-04-03 LAB — PREGNANCY, URINE: Preg Test, Ur: NEGATIVE

## 2020-04-03 MED ORDER — BUPROPION HCL ER (XL) 300 MG PO TB24: 300.0000 mg | ORAL_TABLET | Freq: Every day | ORAL | 0 refills | Status: DC

## 2020-04-03 MED ORDER — TRAZODONE HCL 100 MG PO TABS: 200.0000 mg | ORAL_TABLET | Freq: Every day | ORAL | 0 refills | Status: DC

## 2020-04-03 MED ORDER — QUETIAPINE FUMARATE 300 MG PO TABS: 300.0000 mg | ORAL_TABLET | Freq: Every day | ORAL | 0 refills | Status: DC

## 2020-04-03 MED ORDER — QUETIAPINE FUMARATE 25 MG PO TABS: 25.0000 mg | ORAL_TABLET | ORAL | 0 refills | Status: DC

## 2020-04-03 MED ORDER — LORAZEPAM 1 MG PO TABS
2.0000 mg | ORAL_TABLET | Freq: Once | ORAL | Status: AC
  Administered 2020-04-03: 2 mg via ORAL
  Filled 2020-04-03: qty 2

## 2020-04-03 NOTE — ED Notes (Signed)
Upon discharging pt, pt asked for different RX's, MD notified, awaiting new RX orders

## 2020-04-03 NOTE — Discharge Instructions (Addendum)
Resume taking your medications as previously prescribed.  You have been given refills of these medications here in the ER as a courtesy.  Follow-up with your primary doctor for future refills, and return to the ER if symptoms worsen or change.

## 2020-04-03 NOTE — ED Triage Notes (Signed)
Pt is anxious, worried about her son and his wife who are hospitalized with covid.  Pt hyperventilating in triage.  Pt states she is out of her medication for that.  Pt also having some urgency and frequency with urination.  No acute respiratory distress noted.

## 2020-04-03 NOTE — ED Notes (Signed)
States she has been having a  panic attack since 0715 this morning, has been out of her meds of trazadone, seraquil, vistaril and prozac for the past week. States she is not able to go see her primary care doctor for med refills, also states her son and his girlfriend both tested positive for covid two days ago and she is nervous about them being positive because the girlfriend is pregnant, denies any fever or chills, denies loss of taste or smell, states she has had a sore throat and slight cough, has been vaccinated for covid

## 2020-04-03 NOTE — ED Provider Notes (Signed)
[facility] EMERGENCY DEPARTMENT Provider Note   CSN: [id number] Arrival date & time: 04/03/20  1044     History Chief Complaint  Patient presents with  . Anxiety  . Dysuria    Marilyn Fowler is a 44 y.o. female.  Patient is a 44 year old female with history of hypertension bipolar.  She presents today for evaluation of anxiety and palpitations.  Patient tells me she has been off of her psychiatric medications for quite some time related to financial issues.  She tells me that her daughter is pregnant and was recently admitted to the hospital with Covid.  This has caused her stress and anxiety.  She also describes frequent urination, but denies any fevers, chills, or hematuria.  The history is provided by the patient.  Anxiety This is a recurrent problem. The problem occurs constantly. The problem has been gradually worsening. Nothing aggravates the symptoms. Nothing relieves the symptoms. She has tried nothing for the symptoms.       Past Medical History:  Diagnosis Date  . Alcohol abuse   . Anemia   . Anxiety   . Blood transfusion without reported diagnosis   . Depression   . Hemorrhoid   . Hypertension   . Migraine     Patient Active Problem List   Diagnosis Date Noted  . Sprained ankle 01/14/2020  . Bipolar depression (HCC) 01/13/2020  . Physical exam 08/07/2017  . Headache 06/30/2017  . Chronic right shoulder pain 09/04/2016  . Hypokalemia 04/30/2016  . Hypomagnesemia 04/30/2016  . Alcoholic hepatitis 04/30/2016  . Alcohol induced fatty liver 04/30/2016  . Alcohol abuse 04/29/2016  . Left medial knee pain 06/29/2015  . Acute bacterial sinusitis 10/11/2014  . Maxillary sinusitis, acute 09/14/2014  . Gastroenteritis 11/09/2013  . Pleuritic chest pain 09/17/2013  . Chest pain 11/18/2012  . Morbid obesity (HCC) 09/29/2012  . HTN (hypertension) 07/12/2012  . Migraine 07/12/2012  . Bipolar II disorder, most recent episode major depressive (HCC)  07/11/2012    Past Surgical History:  Procedure Laterality Date  . HEMORRHOID SURGERY    . HERNIA REPAIR    . TONSILLECTOMY    . VENTRAL HERNIA REPAIR       OB History   No obstetric history on file.     Family History  Problem Relation Age of Onset  . Osteoarthritis Mother   . Diabetes Mother   . Hypertension Mother   . Depression Mother   . Hypertension Father   . Diabetes Father   . Asthma Son   . Deep vein thrombosis Maternal Grandmother   . Deep vein thrombosis Maternal Grandfather   . Stroke Neg Hx     Social History   Tobacco Use  . Smoking status: Former Smoker    Years: 10.00  . Smokeless tobacco: Never Used  . Tobacco comment: smoked 1994-2012, up to 1 pp WEEK  Vaping Use  . Vaping Use: Never used  Substance Use Topics  . Alcohol use: Yes    Comment: Reports "I am an alcoholic"  . Drug use: No    Home Medications Prior to Admission medications   Medication Sig Start Date End Date Taking? Authorizing Provider  amLODipine (NORVASC) 2.5 MG tablet Take 1 tablet (2.5 mg total) by mouth daily. 01/19/20   Clapacs, Jackquline Denmark, MD  Ascorbic Acid (VITAMIN C PO) Take 1 tablet by mouth daily.    [provider]  buPROPion (WELLBUTRIN XL) 300 MG 24 hr tablet Take 1 tablet (300 mg total)  by mouth daily. 01/19/20   Clapacs, Jackquline Denmark, MD  Cholecalciferol (VITAMIN D3 PO) Take 1 tablet by mouth daily.    [provider]  hydrOXYzine (ATARAX/VISTARIL) 25 MG tablet Take 1 tablet (25 mg total) by mouth every 4 (four) hours as needed for anxiety. 01/18/20   Clapacs, Jackquline Denmark, MD  ibuprofen (ADVIL) 200 MG tablet Take 400 mg by mouth every 4 (four) hours as needed for headache (pain).    [provider]  metoprolol succinate (TOPROL-XL) 25 MG 24 hr tablet Take 1 tablet (25 mg total) by mouth daily. 01/19/20   Clapacs, Jackquline Denmark, MD  naproxen (NAPROSYN) 375 MG tablet Take 1 tablet (375 mg total) by mouth 2 (two) times daily as needed for moderate pain. Patient not  taking: Reported on 03/03/2020 01/18/20   Clapacs, Jackquline Denmark, MD  phenazopyridine (PYRIDIUM) 200 MG tablet Take 1 tablet (200 mg total) by mouth 3 (three) times daily as needed (urinary discomfort). Patient not taking: Reported on 03/03/2020 01/18/20   Clapacs, Jackquline Denmark, MD  QUEtiapine (SEROQUEL) 25 MG tablet Take 1 tablet (25 mg total) by mouth 2 (two) times daily. Patient taking differently: Take 25 mg by mouth See admin instructions. Take one tablet (25 mg) by mouth twice daily - morning and afternoon 01/18/20   Clapacs, Jackquline Denmark, MD  QUEtiapine (SEROQUEL) 300 MG tablet Take 1 tablet (300 mg total) by mouth at bedtime. 01/18/20   Clapacs, Jackquline Denmark, MD  traZODone (DESYREL) 100 MG tablet Take 2 tablets (200 mg total) by mouth at bedtime. 01/18/20   Clapacs, Jackquline Denmark, MD    Allergies    Methocarbamol and Toradol [ketorolac tromethamine]  Review of Systems   Review of Systems  All other systems reviewed and are negative.   Physical Exam Updated Vital Signs BP (!) 149/93 (BP Location: Right Arm)   Pulse (!) 106   Temp 98.3 F (36.8 C)   Resp 18   Ht 5\' 4"  (1.626 m)   Wt 129.3 kg   LMP 03/19/2020   SpO2 96%   BMI 48.92 kg/m   Physical Exam Vitals and nursing note reviewed.  Constitutional:      General: She is not in acute distress.    Appearance: She is well-developed. She is not diaphoretic.  HENT:     Head: Normocephalic and atraumatic.  Cardiovascular:     Rate and Rhythm: Normal rate and regular rhythm.     Heart sounds: No murmur heard.  No friction rub. No gallop.   Pulmonary:     Effort: Pulmonary effort is normal. No respiratory distress.     Breath sounds: Normal breath sounds. No wheezing.  Abdominal:     General: Bowel sounds are normal. There is no distension.     Palpations: Abdomen is soft.     Tenderness: There is no abdominal tenderness.  Musculoskeletal:        General: Normal range of motion.     Cervical back: Normal range of motion and neck supple.  Skin:     General: Skin is warm and dry.  Neurological:     Mental Status: She is alert and oriented to person, place, and time.  Psychiatric:        Attention and Perception: Attention and perception normal.        Mood and Affect: Mood normal.        Behavior: Behavior normal.        Cognition and Memory: Cognition normal.  Judgment: Judgment normal.     ED Results / Procedures / Treatments   Labs (all labs ordered are listed, but only abnormal results are displayed) Labs Reviewed  URINALYSIS, ROUTINE W REFLEX MICROSCOPIC - Abnormal; Notable for the following components:      Result Value   Specific Gravity, Urine <1.005 (*)    All other components within normal limits  PREGNANCY, URINE    EKG None  Radiology No results found.  Procedures Procedures (including critical care time)  Medications Ordered in ED Medications  LORazepam (ATIVAN) tablet 2 mg (has no administration in time range)    ED Course  I have reviewed the triage vital signs and the nursing notes.  Pertinent labs & imaging results that were available during my care of the patient were reviewed by me and considered in my medical decision making (see chart for details).    MDM Rules/Calculators/A&P  Patient presenting here complaining of anxiety.  She has been off of her psychiatric medications and is under increased stress due to her daughter who was hospitalized with Covid and is pregnant.  She also describes dysuria and frequency, however her urinalysis is clear.  Patient to be given Ativan.  I will write for 2-week refills of her psychiatric medications and she can follow-up with her psychiatrist.  Final Clinical Impression(s) / ED Diagnoses Final diagnoses:  None    Rx / DC Orders ED Discharge Orders    None       Geoffery Lyons, MD 04/03/20 1200

## 2020-04-19 ENCOUNTER — Encounter (HOSPITAL_BASED_OUTPATIENT_CLINIC_OR_DEPARTMENT_OTHER): Payer: Self-pay

## 2020-04-19 ENCOUNTER — Emergency Department (HOSPITAL_BASED_OUTPATIENT_CLINIC_OR_DEPARTMENT_OTHER)
Admission: EM | Admit: 2020-04-19 | Discharge: 2020-04-19 | Disposition: A | Discharge status: Home / Self Care | Payer: Self-pay | Attending: Emergency Medicine | Admitting: Emergency Medicine

## 2020-04-19 ENCOUNTER — Other Ambulatory Visit: Payer: Self-pay

## 2020-04-19 ENCOUNTER — Emergency Department (HOSPITAL_BASED_OUTPATIENT_CLINIC_OR_DEPARTMENT_OTHER): Payer: Self-pay

## 2020-04-19 DIAGNOSIS — F410 Panic disorder [episodic paroxysmal anxiety] without agoraphobia: Secondary | ICD-10-CM

## 2020-04-19 DIAGNOSIS — Z87891 Personal history of nicotine dependence: Secondary | ICD-10-CM | POA: Insufficient documentation

## 2020-04-19 DIAGNOSIS — Z79899 Other long term (current) drug therapy: Secondary | ICD-10-CM | POA: Insufficient documentation

## 2020-04-19 DIAGNOSIS — R0602 Shortness of breath: Secondary | ICD-10-CM

## 2020-04-19 DIAGNOSIS — I10 Essential (primary) hypertension: Secondary | ICD-10-CM | POA: Insufficient documentation

## 2020-04-19 DIAGNOSIS — R000 Tachycardia, unspecified: Secondary | ICD-10-CM | POA: Insufficient documentation

## 2020-04-19 LAB — TROPONIN I (HIGH SENSITIVITY): Troponin I (High Sensitivity): 2 ng/L (ref <18)

## 2020-04-19 LAB — CBC
HCT: 36.4 % (ref 36.0–46.0)
Hemoglobin: 11.3 g/dL — ABNORMAL LOW (ref 12.0–15.0)
MCH: 22.6 pg — ABNORMAL LOW (ref 26.0–34.0)
MCHC: 31 g/dL (ref 30.0–36.0)
MCV: 72.8 fL — ABNORMAL LOW (ref 80.0–100.0)
Platelets: 437 10*3/uL — ABNORMAL HIGH (ref 150–400)
RBC: 5 MIL/uL (ref 3.87–5.11)
RDW: 16.5 % — ABNORMAL HIGH (ref 11.5–15.5)
WBC: 8.7 10*3/uL (ref 4.0–10.5)
nRBC: 0 % (ref 0.0–0.2)

## 2020-04-19 LAB — PREGNANCY, URINE: Preg Test, Ur: NEGATIVE

## 2020-04-19 LAB — BASIC METABOLIC PANEL
Anion gap: 13 (ref 5–15)
BUN: 12 mg/dL (ref 6–20)
CO2: 19 mmol/L — ABNORMAL LOW (ref 22–32)
Calcium: 9.2 mg/dL (ref 8.9–10.3)
Chloride: 104 mmol/L (ref 98–111)
Creatinine, Ser: 0.83 mg/dL (ref 0.44–1.00)
GFR, Estimated: >60 mL/min (ref >60)
Glucose, Bld: 114 mg/dL — ABNORMAL HIGH (ref 70–99)
Potassium: 4.2 mmol/L (ref 3.5–5.1)
Sodium: 136 mmol/L (ref 135–145)

## 2020-04-19 MED ORDER — HYDROXYZINE HCL 25 MG PO TABS: 25.0000 mg | ORAL_TABLET | Freq: Four times a day (QID) | ORAL | 0 refills | Status: DC

## 2020-04-19 MED ORDER — QUETIAPINE FUMARATE 25 MG PO TABS: 25.0000 mg | ORAL_TABLET | Freq: Every day | ORAL | 0 refills | Status: DC

## 2020-04-19 MED ORDER — PROCHLORPERAZINE MALEATE 10 MG PO TABS
10.0000 mg | ORAL_TABLET | Freq: Once | ORAL | Status: AC
  Administered 2020-04-19: 13:00:00 10 mg via ORAL
  Filled 2020-04-19: qty 1

## 2020-04-19 MED ORDER — ACETAMINOPHEN 325 MG PO TABS
650.0000 mg | ORAL_TABLET | Freq: Once | ORAL | Status: AC
  Administered 2020-04-19: 13:00:00 650 mg via ORAL
  Filled 2020-04-19: qty 2

## 2020-04-19 MED ORDER — LORAZEPAM 2 MG/ML IJ SOLN: 2.0000 mg | Freq: Once | INTRAMUSCULAR | Status: DC

## 2020-04-19 MED ORDER — FLUOXETINE HCL 20 MG PO TABS: 20.0000 mg | ORAL_TABLET | Freq: Every day | ORAL | 0 refills | Status: DC

## 2020-04-19 MED ORDER — QUETIAPINE FUMARATE 300 MG PO TABS: 300.0000 mg | ORAL_TABLET | Freq: Every day | ORAL | 0 refills | Status: DC

## 2020-04-19 MED ORDER — LORAZEPAM 2 MG/ML IJ SOLN
2.0000 mg | Freq: Once | INTRAMUSCULAR | Status: AC
  Administered 2020-04-19: 13:00:00 2 mg via INTRAMUSCULAR
  Filled 2020-04-19: qty 1

## 2020-04-19 NOTE — ED Provider Notes (Signed)
[facility] EMERGENCY DEPARTMENT Provider Note   CSN: [id number] Arrival date & time: 04/19/20  1154     History Chief Complaint  Patient presents with  . Shortness of Breath    Marilyn Fowler is a 44 y.o. female.  Patient presents ER chief complaint of having an anxiety attack.  She states she gets these several times a week.  She is run out of her medications for couple weeks and has been getting them more frequently.  Describes it as a sensation of panic and shortness of breath and rapid breathing.  Denies any headache or chest pain at this time.  No fever no vomiting cough or diarrhea.  Has an appointment with her behavioral health physicians within 2 weeks and requesting medications until she gets that appointment.        Past Medical History:  Diagnosis Date  . Alcohol abuse   . Anemia   . Anxiety   . Blood transfusion without reported diagnosis   . Depression   . Hemorrhoid   . Hypertension   . Migraine     Patient Active Problem List   Diagnosis Date Noted  . Sprained ankle 01/14/2020  . Bipolar depression (HCC) 01/13/2020  . Physical exam 08/07/2017  . Headache 06/30/2017  . Chronic right shoulder pain 09/04/2016  . Hypokalemia 04/30/2016  . Hypomagnesemia 04/30/2016  . Alcoholic hepatitis 04/30/2016  . Alcohol induced fatty liver 04/30/2016  . Alcohol abuse 04/29/2016  . Left medial knee pain 06/29/2015  . Acute bacterial sinusitis 10/11/2014  . Maxillary sinusitis, acute 09/14/2014  . Gastroenteritis 11/09/2013  . Pleuritic chest pain 09/17/2013  . Chest pain 11/18/2012  . Morbid obesity (HCC) 09/29/2012  . HTN (hypertension) 07/12/2012  . Migraine 07/12/2012  . Bipolar II disorder, most recent episode major depressive (HCC) 07/11/2012    Past Surgical History:  Procedure Laterality Date  . HEMORRHOID SURGERY    . HERNIA REPAIR    . TONSILLECTOMY    . VENTRAL HERNIA REPAIR       OB History   No obstetric history on file.      Family History  Problem Relation Age of Onset  . Osteoarthritis Mother   . Diabetes Mother   . Hypertension Mother   . Depression Mother   . Hypertension Father   . Diabetes Father   . Asthma Son   . Deep vein thrombosis Maternal Grandmother   . Deep vein thrombosis Maternal Grandfather   . Stroke Neg Hx     Social History   Tobacco Use  . Smoking status: Former Smoker    Years: 10.00  . Smokeless tobacco: Never Used  . Tobacco comment: smoked 1994-2012, up to 1 pp WEEK  Vaping Use  . Vaping Use: Never used  Substance Use Topics  . Alcohol use: Yes    Comment: states she quit 03/20/20 but drank this am  . Drug use: No    Home Medications Prior to Admission medications   Medication Sig Start Date End Date Taking? Authorizing Provider  amLODipine (NORVASC) 2.5 MG tablet Take 1 tablet (2.5 mg total) by mouth daily. 01/19/20   Clapacs, Jackquline Denmark, MD  Ascorbic Acid (VITAMIN C PO) Take 1 tablet by mouth daily.    [provider]  Cholecalciferol (VITAMIN D3 PO) Take 1 tablet by mouth daily.    [provider]  FLUoxetine (PROZAC) 20 MG tablet Take 1 tablet (20 mg total) by mouth daily for 10 days. 04/19/20 04/29/20  Norman Clay  S, MD  hydrOXYzine (ATARAX/VISTARIL) 25 MG tablet Take 1 tablet (25 mg total) by mouth every 6 (six) hours. 04/19/20   Cheryll Cockayne, MD  ibuprofen (ADVIL) 200 MG tablet Take 400 mg by mouth every 4 (four) hours as needed for headache (pain).    [provider]  metoprolol succinate (TOPROL-XL) 25 MG 24 hr tablet Take 1 tablet (25 mg total) by mouth daily. 01/19/20   Clapacs, Jackquline Denmark, MD  naproxen (NAPROSYN) 375 MG tablet Take 1 tablet (375 mg total) by mouth 2 (two) times daily as needed for moderate pain. Patient not taking: Reported on 03/03/2020 01/18/20   Clapacs, Jackquline Denmark, MD  phenazopyridine (PYRIDIUM) 200 MG tablet Take 1 tablet (200 mg total) by mouth 3 (three) times daily as needed (urinary discomfort). Patient not taking:  Reported on 03/03/2020 01/18/20   Clapacs, Jackquline Denmark, MD  QUEtiapine (SEROQUEL) 25 MG tablet Take 1 tablet (25 mg total) by mouth at bedtime for 10 days. 04/19/20 04/29/20  Cheryll Cockayne, MD  QUEtiapine (SEROQUEL) 300 MG tablet Take 1 tablet (300 mg total) by mouth at bedtime for 10 days. 04/19/20 04/29/20  Cheryll Cockayne, MD  traZODone (DESYREL) 100 MG tablet Take 2 tablets (200 mg total) by mouth at bedtime. 04/03/20   Geoffery Lyons, MD  buPROPion (WELLBUTRIN XL) 300 MG 24 hr tablet Take 1 tablet (300 mg total) by mouth daily. 04/03/20 04/03/20  Geoffery Lyons, MD    Allergies    Methocarbamol and Toradol [ketorolac tromethamine]  Review of Systems   Review of Systems  Constitutional: Negative for fever.  HENT: Negative for ear pain.   Eyes: Negative for pain.  Respiratory: Positive for shortness of breath. Negative for cough.   Cardiovascular: Negative for chest pain.  Gastrointestinal: Negative for abdominal pain.  Genitourinary: Negative for flank pain.  Musculoskeletal: Negative for back pain.  Skin: Negative for rash.  Neurological: Negative for headaches.    Physical Exam Updated Vital Signs BP (!) 153/101 (BP Location: Right Arm)   Pulse 90   Temp 97.8 F (36.6 C) (Oral)   Resp (!) 22   Ht 5\' 2"  (1.575 m)   Wt 129.3 kg   LMP 04/17/2020   SpO2 100%   BMI 52.13 kg/m   Physical Exam Constitutional:      General: She is not in acute distress.    Appearance: Normal appearance.  HENT:     Head: Normocephalic.     Nose: Nose normal.  Eyes:     Extraocular Movements: Extraocular movements intact.  Cardiovascular:     Rate and Rhythm: Tachycardia present.  Pulmonary:     Breath sounds: No wheezing or rales.  Abdominal:     Palpations: Abdomen is soft.     Tenderness: There is no abdominal tenderness.  Musculoskeletal:        General: Normal range of motion.     Cervical back: Normal range of motion.  Skin:    General: Skin is warm.  Neurological:     General: No focal  deficit present.     Mental Status: She is alert.     ED Results / Procedures / Treatments   Labs (all labs ordered are listed, but only abnormal results are displayed) Labs Reviewed  BASIC METABOLIC PANEL - Abnormal; Notable for the following components:      Result Value   CO2 19 (*)    Glucose, Bld 114 (*)    All other components within normal limits  CBC - Abnormal; Notable for the following components:   Hemoglobin 11.3 (*)    MCV 72.8 (*)    MCH 22.6 (*)    RDW 16.5 (*)    Platelets 437 (*)    All other components within normal limits  PREGNANCY, URINE  TROPONIN I (HIGH SENSITIVITY)    EKG EKG Interpretation  Date/Time:  Wednesday April 19 2020 11:56:11 EST Ventricular Rate:  100 PR Interval:  126 QRS Duration: 72 QT Interval:  370 QTC Calculation: 477 R Axis:   -15 Text Interpretation: Normal sinus rhythm Cannot rule out Anterior infarct , age undetermined Abnormal ECG Confirmed by Norman Clay ([id number]) on 04/19/2020 1:36:16 PM   Radiology No results found.  Procedures Procedures (including critical care time)  Medications Ordered in ED Medications  LORazepam (ATIVAN) injection 2 mg (2 mg Intramuscular Given 04/19/20 1317)  prochlorperazine (COMPAZINE) tablet 10 mg (10 mg Oral Given 04/19/20 1316)  acetaminophen (TYLENOL) tablet 650 mg (650 mg Oral Given 04/19/20 1316)    ED Course  I have reviewed the triage vital signs and the nursing notes.  Pertinent labs & imaging results that were available during my care of the patient were reviewed by me and considered in my medical decision making (see chart for details).    MDM Rules/Calculators/A&P                          Labs were sent and these appear unremarkable.  Troponin negative.  Patient declined chest x-ray stating that she gets like this very frequently.  Symptoms improving with Ativan, medications given for her headache.  Advised continued follow-up with behavioral health physicians within  the week.  Advised immediate return for worsening symptoms fevers cough or any additional concerns.    Final Clinical Impression(s) / ED Diagnoses Final diagnoses:  Shortness of breath  Panic attack    Rx / DC Orders ED Discharge Orders         Ordered    QUEtiapine (SEROQUEL) 300 MG tablet  Daily at bedtime        04/19/20 1353    QUEtiapine (SEROQUEL) 25 MG tablet  Daily at bedtime        04/19/20 1353    FLUoxetine (PROZAC) 20 MG tablet  Daily        04/19/20 1353    hydrOXYzine (ATARAX/VISTARIL) 25 MG tablet  Every 6 hours        04/19/20 1353           Cheryll Cockayne, MD 04/19/20 1353

## 2020-04-19 NOTE — Discharge Instructions (Addendum)
Call your primary care doctor or specialist as discussed in the next 2-3 days.   Return immediately back to the ER if:  Your symptoms worsen within the next 12-24 hours. You develop new symptoms such as new fevers, persistent vomiting, new pain, shortness of breath, or new weakness or numbness, or if you have any other concerns.  

## 2020-04-19 NOTE — ED Triage Notes (Addendum)
To triage in w/c hyperventilating-c/o SOB, heart racing, CP x 2.5 hours-states she feels she is having a panic attack-states "I've been out of my psych meds because I can't afford them" x 1 month-pt encouraged to take slow deep breaths

## 2020-04-19 NOTE — ED Notes (Addendum)
Pt did not want to wear a gown or be placed on th monitor or  X rays done

## 2020-04-24 ENCOUNTER — Other Ambulatory Visit: Payer: Self-pay | Admitting: Psychiatry

## 2020-05-02 ENCOUNTER — Telehealth: Payer: Self-pay | Admitting: Physician Assistant

## 2020-05-02 NOTE — Telephone Encounter (Signed)
Next appt is 05/29/20. Requesting refills on Prozac, Seroquil and Trazadone. Call to [facility] on [address], [place]. [phone]

## 2020-05-03 ENCOUNTER — Emergency Department (HOSPITAL_BASED_OUTPATIENT_CLINIC_OR_DEPARTMENT_OTHER)
Admission: EM | Admit: 2020-05-03 | Discharge: 2020-05-03 | Disposition: A | Discharge status: Home / Self Care | Payer: Self-pay | Attending: Emergency Medicine | Admitting: Emergency Medicine

## 2020-05-03 ENCOUNTER — Encounter (HOSPITAL_BASED_OUTPATIENT_CLINIC_OR_DEPARTMENT_OTHER): Payer: Self-pay | Admitting: Emergency Medicine

## 2020-05-03 ENCOUNTER — Other Ambulatory Visit: Payer: Self-pay

## 2020-05-03 ENCOUNTER — Other Ambulatory Visit: Payer: Self-pay | Admitting: Physician Assistant

## 2020-05-03 DIAGNOSIS — G43009 Migraine without aura, not intractable, without status migrainosus: Secondary | ICD-10-CM

## 2020-05-03 DIAGNOSIS — Z87891 Personal history of nicotine dependence: Secondary | ICD-10-CM | POA: Insufficient documentation

## 2020-05-03 DIAGNOSIS — I10 Essential (primary) hypertension: Secondary | ICD-10-CM | POA: Insufficient documentation

## 2020-05-03 DIAGNOSIS — G43909 Migraine, unspecified, not intractable, without status migrainosus: Secondary | ICD-10-CM | POA: Insufficient documentation

## 2020-05-03 MED ORDER — DEXAMETHASONE SODIUM PHOSPHATE 10 MG/ML IJ SOLN
10.0000 mg | Freq: Once | INTRAMUSCULAR | Status: AC
  Administered 2020-05-03: 10 mg via INTRAVENOUS
  Filled 2020-05-03: qty 1

## 2020-05-03 MED ORDER — SODIUM CHLORIDE 0.9 % IV BOLUS
500.0000 mL | Freq: Once | INTRAVENOUS | Status: AC
  Administered 2020-05-03: 500 mL via INTRAVENOUS

## 2020-05-03 MED ORDER — LORAZEPAM 1 MG PO TABS
1.0000 mg | ORAL_TABLET | Freq: Once | ORAL | Status: AC
  Administered 2020-05-03: 1 mg via ORAL
  Filled 2020-05-03: qty 1

## 2020-05-03 MED ORDER — LORAZEPAM 2 MG/ML IJ SOLN
1.0000 mg | Freq: Once | INTRAMUSCULAR | Status: AC
  Administered 2020-05-03: 1 mg via INTRAVENOUS
  Filled 2020-05-03: qty 1

## 2020-05-03 MED ORDER — QUETIAPINE FUMARATE ER 300 MG PO TB24: 300.0000 mg | ORAL_TABLET | Freq: Every day | ORAL | 0 refills | Status: DC

## 2020-05-03 MED ORDER — PROCHLORPERAZINE MALEATE 10 MG PO TABS
10.0000 mg | ORAL_TABLET | Freq: Once | ORAL | Status: AC
  Administered 2020-05-03: 10 mg via ORAL
  Filled 2020-05-03: qty 1

## 2020-05-03 MED ORDER — FLUOXETINE HCL 20 MG PO TABS: 20.0000 mg | ORAL_TABLET | Freq: Every day | ORAL | 0 refills | Status: DC

## 2020-05-03 MED ORDER — MAGNESIUM SULFATE IN D5W 1-5 GM/100ML-% IV SOLN
1.0000 g | Freq: Once | INTRAVENOUS | Status: AC
  Administered 2020-05-03: 1 g via INTRAVENOUS
  Filled 2020-05-03: qty 100

## 2020-05-03 MED ORDER — TRAZODONE HCL 100 MG PO TABS: 100.0000 mg | ORAL_TABLET | Freq: Every evening | ORAL | 0 refills | Status: DC | PRN

## 2020-05-03 NOTE — Discharge Instructions (Signed)

## 2020-05-03 NOTE — ED Provider Notes (Signed)
Emergency Department Provider Note   I have reviewed the triage vital signs and the nursing notes.   HISTORY  Chief Complaint Migraine and Panic Attack   HPI Marilyn Fowler is a 45 y.o. female with past medical history reviewed below presents emergency department with return of her migraine and anxiety symptoms.  Patient states that she often gets a both together and has had more frequent headaches over the past several months.  She has followed with neurology in the past but has not been seeing them recently.  She denies any new or different quality/character to her headache symptoms currently.  She is having throbbing type headache pain with photophobia.  Denies any fevers.  She is feeling nauseated.  She notes that she develops anxiety symptoms with deep breathing along with her headache symptoms which she is currently experiencing.    Past Medical History:  Diagnosis Date  . Alcohol abuse   . Anemia   . Anxiety   . Blood transfusion without reported diagnosis   . Depression   . Hemorrhoid   . Hypertension   . Migraine     Patient Active Problem List   Diagnosis Date Noted  . Sprained ankle 01/14/2020  . Bipolar depression (HCC) 01/13/2020  . Physical exam 08/07/2017  . Headache 06/30/2017  . Chronic right shoulder pain 09/04/2016  . Hypokalemia 04/30/2016  . Hypomagnesemia 04/30/2016  . Alcoholic hepatitis 04/30/2016  . Alcohol induced fatty liver 04/30/2016  . Alcohol abuse 04/29/2016  . Left medial knee pain 06/29/2015  . Acute bacterial sinusitis 10/11/2014  . Maxillary sinusitis, acute 09/14/2014  . Gastroenteritis 11/09/2013  . Pleuritic chest pain 09/17/2013  . Chest pain 11/18/2012  . Morbid obesity (HCC) 09/29/2012  . HTN (hypertension) 07/12/2012  . Migraine 07/12/2012  . Bipolar II disorder, most recent episode major depressive (HCC) 07/11/2012    Past Surgical History:  Procedure Laterality Date  . HEMORRHOID SURGERY    . HERNIA REPAIR    .  TONSILLECTOMY    . VENTRAL HERNIA REPAIR      Allergies Methocarbamol and Toradol [ketorolac tromethamine]  Family History  Problem Relation Age of Onset  . Osteoarthritis Mother   . Diabetes Mother   . Hypertension Mother   . Depression Mother   . Hypertension Father   . Diabetes Father   . Asthma Son   . Deep vein thrombosis Maternal Grandmother   . Deep vein thrombosis Maternal Grandfather   . Stroke Neg Hx     Social History Social History   Tobacco Use  . Smoking status: Former Smoker    Years: 10.00  . Smokeless tobacco: Never Used  . Tobacco comment: smoked 1994-2012, up to 1 pp WEEK  Vaping Use  . Vaping Use: Never used  Substance Use Topics  . Alcohol use: Yes    Comment: states she quit 03/20/20 but drank this am  . Drug use: No    Review of Systems  Constitutional: No fever/chills. Positive anxiety symptoms.  Eyes: No visual changes. ENT: No sore throat. Cardiovascular: Denies chest pain. Respiratory: Denies shortness of breath. Gastrointestinal: No abdominal pain. Positive nausea, no vomiting.  No diarrhea.  No constipation. Genitourinary: Negative for dysuria. Musculoskeletal: Negative for back pain. Skin: Negative for rash. Neurological: Negative for focal weakness or numbness. Positive HA.   10-point ROS otherwise negative.  ____________________________________________   PHYSICAL EXAM:  VITAL SIGNS: ED Triage Vitals  Enc Vitals Group     BP 05/03/20 0649 136/79  Pulse Rate 05/03/20 0649 (!) 108     Resp 05/03/20 0649 (!) 22     Temp 05/03/20 0649 97.9 F (36.6 C)     Temp Source 05/03/20 0649 Oral     SpO2 05/03/20 0649 100 %     Weight 05/03/20 0648 285 lb 0.9 oz (129.3 kg)     Height 05/03/20 0648 5\' 2"  (1.575 m)   Constitutional: Alert and oriented. Well appearing and in no acute distress. Eyes: Conjunctivae are normal.  Head: Atraumatic. Nose: No congestion/rhinnorhea. Mouth/Throat: Mucous membranes are moist. Neck: No  stridor.   Cardiovascular: Normal rate, regular rhythm. Good peripheral circulation. Grossly normal heart sounds.   Respiratory: Normal respiratory effort.  No retractions. Lungs CTAB. Gastrointestinal: Soft and nontender. No distention.  Musculoskeletal: No lower extremity tenderness nor edema. No gross deformities of extremities. Neurologic:  Normal speech and language. No gross focal neurologic deficits are appreciated.  Skin:  Skin is warm, dry and intact. No rash noted.  ____________________________________________   PROCEDURES  Procedure(s) performed:   Procedures  None  ____________________________________________   INITIAL IMPRESSION / ASSESSMENT AND PLAN / ED COURSE  Pertinent labs & imaging results that were available during my care of the patient were reviewed by me and considered in my medical decision making (see chart for details).   Patient presents emergency department for evaluation of a migraine headache with anxiety/panic attack-like symptoms.  Patient is overall well-appearing with no findings on exam to suspect meningitis or other acute process.  Plan for symptom management and referral to neurology as an outpatient along with PCP.   Patient feeling improved on reevaluation.  Anxiety symptoms have improved.  Placed referral to neurology in epic.  Patient to call to schedule an appointment.  Discussed ED return precautions. ____________________________________________  FINAL CLINICAL IMPRESSION(S) / ED DIAGNOSES  Final diagnoses:  Migraine without aura and without status migrainosus, not intractable     MEDICATIONS GIVEN DURING THIS VISIT:  Medications  LORazepam (ATIVAN) injection 1 mg (1 mg Intravenous Given 05/03/20 0833)  sodium chloride 0.9 % bolus 500 mL (0 mLs Intravenous Stopped 05/03/20 0950)  prochlorperazine (COMPAZINE) tablet 10 mg (10 mg Oral Given 05/03/20 0833)  dexamethasone (DECADRON) injection 10 mg (10 mg Intravenous Given 05/03/20 0833)   magnesium sulfate IVPB 1 g 100 mL (0 g Intravenous Stopped 05/03/20 0950)  LORazepam (ATIVAN) tablet 1 mg (1 mg Oral Given 05/03/20 1022)    Note:  This document was prepared using Dragon voice recognition software and may include unintentional dictation errors.  07/01/20, MD, [facility]    Kaiulani Sitton, [facility], MD 05/05/20 [phone]

## 2020-05-03 NOTE — Telephone Encounter (Signed)
Rx were sent.

## 2020-05-03 NOTE — ED Triage Notes (Signed)
Pt c/o migraine x 1 week and states she is having a panic attack. Pt began hyperventilating upon entering triage.

## 2020-05-09 ENCOUNTER — Encounter (HOSPITAL_BASED_OUTPATIENT_CLINIC_OR_DEPARTMENT_OTHER): Payer: Self-pay | Admitting: Emergency Medicine

## 2020-05-09 ENCOUNTER — Emergency Department (HOSPITAL_BASED_OUTPATIENT_CLINIC_OR_DEPARTMENT_OTHER)
Admission: EM | Admit: 2020-05-09 | Discharge: 2020-05-09 | Disposition: A | Discharge status: Home / Self Care | Payer: Self-pay | Attending: Emergency Medicine | Admitting: Emergency Medicine

## 2020-05-09 ENCOUNTER — Telehealth: Payer: Self-pay | Admitting: Physician Assistant

## 2020-05-09 ENCOUNTER — Other Ambulatory Visit: Payer: Self-pay

## 2020-05-09 DIAGNOSIS — G43809 Other migraine, not intractable, without status migrainosus: Secondary | ICD-10-CM

## 2020-05-09 DIAGNOSIS — Z79899 Other long term (current) drug therapy: Secondary | ICD-10-CM | POA: Insufficient documentation

## 2020-05-09 DIAGNOSIS — I10 Essential (primary) hypertension: Secondary | ICD-10-CM | POA: Insufficient documentation

## 2020-05-09 DIAGNOSIS — Z87891 Personal history of nicotine dependence: Secondary | ICD-10-CM | POA: Insufficient documentation

## 2020-05-09 DIAGNOSIS — G43909 Migraine, unspecified, not intractable, without status migrainosus: Secondary | ICD-10-CM | POA: Insufficient documentation

## 2020-05-09 LAB — PREGNANCY, URINE: Preg Test, Ur: NEGATIVE

## 2020-05-09 MED ORDER — HALOPERIDOL LACTATE 5 MG/ML IJ SOLN: 2.0000 mg | Freq: Once | INTRAMUSCULAR | Status: DC

## 2020-05-09 MED ORDER — LORAZEPAM 2 MG/ML IJ SOLN
0.5000 mg | Freq: Once | INTRAMUSCULAR | Status: AC
  Administered 2020-05-09: 0.5 mg via INTRAVENOUS
  Filled 2020-05-09: qty 1

## 2020-05-09 MED ORDER — MAGNESIUM SULFATE 2 GM/50ML IV SOLN
2.0000 g | Freq: Once | INTRAVENOUS | Status: AC
  Administered 2020-05-09: 2 g via INTRAVENOUS
  Filled 2020-05-09: qty 50

## 2020-05-09 MED ORDER — DEXAMETHASONE SODIUM PHOSPHATE 10 MG/ML IJ SOLN
10.0000 mg | Freq: Once | INTRAMUSCULAR | Status: AC
  Administered 2020-05-09: 10 mg via INTRAVENOUS
  Filled 2020-05-09: qty 1

## 2020-05-09 MED ORDER — SODIUM CHLORIDE 0.9 % IV BOLUS
500.0000 mL | Freq: Once | INTRAVENOUS | Status: AC
  Administered 2020-05-09: 500 mL via INTRAVENOUS

## 2020-05-09 MED ORDER — DIPHENHYDRAMINE HCL 50 MG/ML IJ SOLN
25.0000 mg | Freq: Once | INTRAMUSCULAR | Status: AC
  Administered 2020-05-09: 25 mg via INTRAVENOUS
  Filled 2020-05-09: qty 1

## 2020-05-09 MED ORDER — METOCLOPRAMIDE HCL 5 MG/ML IJ SOLN: 10.0000 mg | Freq: Once | INTRAMUSCULAR | Status: DC

## 2020-05-09 NOTE — ED Provider Notes (Addendum)
[facility] EMERGENCY DEPARTMENT Provider Note   CSN: [id number] Arrival date & time: 05/09/20  0450     History Chief Complaint  Patient presents with  . Migraine    Marilyn MaesLeslie Esh is a 45 y.o. female.  The history is provided by the patient.  Migraine This is a recurrent problem. The current episode started more than 2 days ago. The problem occurs constantly. The problem has not changed since onset.Associated symptoms include headaches. Pertinent negatives include no chest pain, no abdominal pain and no shortness of breath. Nothing aggravates the symptoms. Nothing relieves the symptoms. Treatments tried: ibuprofen. The treatment provided no relief.       Past Medical History:  Diagnosis Date  . Alcohol abuse   . Anemia   . Anxiety   . Blood transfusion without reported diagnosis   . Depression   . Hemorrhoid   . Hypertension   . Migraine     Patient Active Problem List   Diagnosis Date Noted  . Sprained ankle 01/14/2020  . Bipolar depression (HCC) 01/13/2020  . Physical exam 08/07/2017  . Headache 06/30/2017  . Chronic right shoulder pain 09/04/2016  . Hypokalemia 04/30/2016  . Hypomagnesemia 04/30/2016  . Alcoholic hepatitis 04/30/2016  . Alcohol induced fatty liver 04/30/2016  . Alcohol abuse 04/29/2016  . Left medial knee pain 06/29/2015  . Acute bacterial sinusitis 10/11/2014  . Maxillary sinusitis, acute 09/14/2014  . Gastroenteritis 11/09/2013  . Pleuritic chest pain 09/17/2013  . Chest pain 11/18/2012  . Morbid obesity (HCC) 09/29/2012  . HTN (hypertension) 07/12/2012  . Migraine 07/12/2012  . Bipolar II disorder, most recent episode major depressive (HCC) 07/11/2012    Past Surgical History:  Procedure Laterality Date  . HEMORRHOID SURGERY    . HERNIA REPAIR    . TONSILLECTOMY    . VENTRAL HERNIA REPAIR       OB History   No obstetric history on file.     Family History  Problem Relation Age of Onset  . Osteoarthritis  Mother   . Diabetes Mother   . Hypertension Mother   . Depression Mother   . Hypertension Father   . Diabetes Father   . Asthma Son   . Deep vein thrombosis Maternal Grandmother   . Deep vein thrombosis Maternal Grandfather   . Stroke Neg Hx     Social History   Tobacco Use  . Smoking status: Former Smoker    Years: 10.00  . Smokeless tobacco: Never Used  . Tobacco comment: smoked 1994-2012, up to 1 pp WEEK  Vaping Use  . Vaping Use: Never used  Substance Use Topics  . Alcohol use: Yes    Comment: states she quit 03/20/20 but drank this am  . Drug use: No    Home Medications Prior to Admission medications   Medication Sig Start Date End Date Taking? Authorizing Provider  amLODipine (NORVASC) 2.5 MG tablet Take 1 tablet (2.5 mg total) by mouth daily. 01/19/20   Clapacs, Jackquline DenmarkJohn T, MD  Ascorbic Acid (VITAMIN C PO) Take 1 tablet by mouth daily.    [provider]  Cholecalciferol (VITAMIN D3 PO) Take 1 tablet by mouth daily.    [provider]  FLUoxetine (PROZAC) 20 MG tablet Take 1 tablet (20 mg total) by mouth daily. 05/03/20 06/02/20  Melony OverlyHurst, Teresa T, PA-C  hydrOXYzine (ATARAX/VISTARIL) 25 MG tablet Take 1 tablet (25 mg total) by mouth every 6 (six) hours. 04/19/20   Cheryll CockayneHong, Joshua S, MD  ibuprofen (ADVIL)  200 MG tablet Take 400 mg by mouth every 4 (four) hours as needed for headache (pain).    [provider]  metoprolol succinate (TOPROL-XL) 25 MG 24 hr tablet Take 1 tablet (25 mg total) by mouth daily. 01/19/20   Clapacs, Jackquline Denmark, MD  naproxen (NAPROSYN) 375 MG tablet Take 1 tablet (375 mg total) by mouth 2 (two) times daily as needed for moderate pain. Patient not taking: Reported on 03/03/2020 01/18/20   Clapacs, Jackquline Denmark, MD  phenazopyridine (PYRIDIUM) 200 MG tablet Take 1 tablet (200 mg total) by mouth 3 (three) times daily as needed (urinary discomfort). Patient not taking: Reported on 03/03/2020 01/18/20   Clapacs, Jackquline Denmark, MD  QUEtiapine (SEROQUEL XR) 300  MG 24 hr tablet Take 1 tablet (300 mg total) by mouth at bedtime. 05/03/20   Cherie Ouch, PA-C  traZODone (DESYREL) 100 MG tablet Take 1-2 tablets (100-200 mg total) by mouth at bedtime as needed for sleep. 05/03/20   Cherie Ouch, PA-C  buPROPion (WELLBUTRIN XL) 300 MG 24 hr tablet Take 1 tablet (300 mg total) by mouth daily. 04/03/20 04/03/20  Geoffery Lyons, MD    Allergies    Methocarbamol and Toradol [ketorolac tromethamine]  Review of Systems   Review of Systems  Constitutional: Negative for fever.  HENT: Negative for congestion.   Eyes: Negative for visual disturbance.  Respiratory: Negative for shortness of breath.   Cardiovascular: Negative for chest pain.  Gastrointestinal: Negative for abdominal pain.  Genitourinary: Negative for difficulty urinating.  Musculoskeletal: Negative for arthralgias.  Skin: Negative for rash.  Neurological: Positive for headaches. Negative for speech difficulty and light-headedness.  Psychiatric/Behavioral: Negative for confusion and decreased concentration.  All other systems reviewed and are negative.   Physical Exam Updated Vital Signs BP (!) 155/90 (BP Location: Left Arm)   Pulse (!) 122   Temp 98.1 F (36.7 C) (Oral)   Resp 20   Ht 5\' 2"  (1.575 m)   Wt 129.3 kg   LMP 04/17/2020   SpO2 100%   BMI 52.14 kg/m   Physical Exam Vitals and nursing note reviewed.  Constitutional:      General: She is not in acute distress.    Appearance: Normal appearance.  HENT:     Head: Normocephalic and atraumatic.     Nose: Nose normal.  Eyes:     Extraocular Movements: Extraocular movements intact.     Conjunctiva/sclera: Conjunctivae normal.     Pupils: Pupils are equal, round, and reactive to light.     Comments: No protosis, disks sharp intact cognition   Cardiovascular:     Rate and Rhythm: Normal rate and regular rhythm.     Pulses: Normal pulses.     Heart sounds: Normal heart sounds.  Pulmonary:     Effort: Pulmonary effort is  normal.     Breath sounds: Normal breath sounds.  Abdominal:     General: Abdomen is flat. Bowel sounds are normal.     Palpations: Abdomen is soft.     Tenderness: There is no abdominal tenderness. There is no guarding.  Musculoskeletal:        General: Normal range of motion.     Cervical back: Normal range of motion and neck supple. No rigidity or tenderness.  Skin:    General: Skin is warm and dry.     Capillary Refill: Capillary refill takes less than 2 seconds.  Neurological:     General: No focal deficit present.  Mental Status: She is alert and oriented to person, place, and time.     Cranial Nerves: No cranial nerve deficit.     Deep Tendon Reflexes: Reflexes normal.  Psychiatric:        Mood and Affect: Mood normal.        Behavior: Behavior normal.     ED Results / Procedures / Treatments   Labs (all labs ordered are listed, but only abnormal results are displayed) Labs Reviewed  PREGNANCY, URINE    EKG None  Radiology No results found.  Procedures Procedures (including critical care time)  Medications Ordered in ED Medications  dexamethasone (DECADRON) injection 10 mg (has no administration in time range)  magnesium sulfate IVPB 2 g 50 mL (has no administration in time range)  diphenhydrAMINE (BENADRYL) injection 25 mg (has no administration in time range)  sodium chloride 0.9 % bolus 500 mL (has no administration in time range)    ED Course  I have reviewed the triage vital signs and the nursing notes.  Pertinent labs & imaging results that were available during my care of the patient were reviewed by me and considered in my medical decision making (see chart for details).    Patient now states "my nerves are shot." Furthermore, nurse informed EDP patient now stating she had a panic attack at home.  I will give one dose but no more.   Resting comfortably post medication. There are no non-verbal signs of pain.  Vitals are improved.  Patient is  stable for discharge with close follow up.    The patient did not appear in distress of any kind.  Nore is she panicking.  There is no SI nor HI.  This is not a life threatening emergency and I suspect this is more that the patient is not longer getting benzos as she has overdosed on them in the past and she is coming in repeatedly with the same complaints seeking them.  No medications will be dispensed. I strongly suspect drug seeking behavior.  Patient is stable for discharge with close follow up.    Marilyn Fowler was evaluated in Emergency Department on 05/09/2020 for the symptoms described in the history of present illness. She was evaluated in the context of the global COVID-19 pandemic, which necessitated consideration that the patient might be at risk for infection with the SARS-CoV-2 virus that causes COVID-19. Institutional protocols and algorithms that pertain to the evaluation of patients at risk for COVID-19 are in a state of rapid change based on information released by regulatory bodies including the CDC and federal and state organizations. These policies and algorithms were followed during the patient's care in the ED.  Final Clinical Impression(s) / ED Diagnoses Return for intractable cough, coughing up blood,fevers >100.4 unrelieved by medication, shortness of breath, intractable vomiting, chest pain, shortness of breath, weakness,numbness, changes in speech, facial asymmetry,abdominal pain, passing out,Inability to tolerate liquids or food, cough, altered mental status or any concerns. No signs of systemic illness or infection. The patient is nontoxic-appearing on exam and vital signs are within normal limits.   I have reviewed the triage vital signs and the nursing notes. Pertinent labs &imaging results that were available during my care of the patient were reviewed by me and considered in my medical decision making (see chart for details).After history, exam, and medical  workup I feel the patient has beenappropriately medically screened and is safe for discharge home. Pertinent diagnoses were discussed with the patient. Patient was given return  precautions.   Marilyn Dilworth, MD 05/09/20 1572    Cy Blamer, MD 05/09/20 0630    Sonnie Bias, MD 05/09/20 6203

## 2020-05-09 NOTE — ED Triage Notes (Signed)
Pt c/o migraine HA, hx of same.

## 2020-05-09 NOTE — Telephone Encounter (Signed)
Next appt is 05/29/20. The 300 mg of Seroquel she is taking is causing her to not get a lot of sleep and Trazodone isn't helping with her panic attacks. Is there something else she can take or do to help with the panic attacks and sleepless nights? [phone].

## 2020-05-09 NOTE — Telephone Encounter (Signed)
Please call her.  Have her take the Hydroxyzine 25 mg, 1 p.o. every 6 hours as needed anxiety or sleep.  I see where a few pills were sent in on 04/19/2020 through the ER.  I will send in more if she needs it and I will need her preferred pharmacy.

## 2020-05-10 ENCOUNTER — Other Ambulatory Visit: Payer: Self-pay | Admitting: Physician Assistant

## 2020-05-10 ENCOUNTER — Telehealth: Payer: Self-pay | Admitting: Physician Assistant

## 2020-05-10 MED ORDER — QUETIAPINE FUMARATE ER 300 MG PO TB24: 300.0000 mg | ORAL_TABLET | Freq: Every day | ORAL | 0 refills | Status: DC

## 2020-05-10 MED ORDER — HYDROXYZINE HCL 25 MG PO TABS: 25.0000 mg | ORAL_TABLET | Freq: Three times a day (TID) | ORAL | 0 refills | Status: DC | PRN

## 2020-05-10 NOTE — Telephone Encounter (Signed)
Prescriptions were sent

## 2020-05-10 NOTE — Telephone Encounter (Signed)
Rtc to patient and discussed how she's doing. She reports having panic attacks during the night and drives around her neighborhood to try and calm down. She reports the hydroxyzine does not help at all. The Trazodone does nothing and the Seroquel will keep her asleep about 4 hours. Her anxiety is high during the day as well.   Informed her I would touch base with Rosey Bath and follow up with her recommendations. Apt not until 01/31

## 2020-05-10 NOTE — Telephone Encounter (Signed)
[name] called and said that she checked with [facility] and they never got the script for Seroquel that was sent in on 05/03/20. She also needs a refill on Hydroxyzine. [id number]. Phone # [phone].

## 2020-05-29 ENCOUNTER — Encounter: Payer: Self-pay | Admitting: Physician Assistant

## 2020-05-29 ENCOUNTER — Telehealth (INDEPENDENT_AMBULATORY_CARE_PROVIDER_SITE_OTHER): Payer: Self-pay | Admitting: Physician Assistant

## 2020-05-29 DIAGNOSIS — F411 Generalized anxiety disorder: Secondary | ICD-10-CM

## 2020-05-29 DIAGNOSIS — F3181 Bipolar II disorder: Secondary | ICD-10-CM

## 2020-05-29 DIAGNOSIS — F1310 Sedative, hypnotic or anxiolytic abuse, uncomplicated: Secondary | ICD-10-CM

## 2020-05-29 DIAGNOSIS — Z9151 Personal history of suicidal behavior: Secondary | ICD-10-CM

## 2020-05-29 DIAGNOSIS — G4700 Insomnia, unspecified: Secondary | ICD-10-CM

## 2020-05-29 MED ORDER — FLUOXETINE HCL 40 MG PO CAPS: 40.0000 mg | ORAL_CAPSULE | Freq: Every day | ORAL | 1 refills | Status: DC

## 2020-05-29 MED ORDER — MIRTAZAPINE 15 MG PO TABS: 7.5000 mg | ORAL_TABLET | Freq: Every evening | ORAL | 1 refills | Status: DC | PRN

## 2020-05-29 MED ORDER — GABAPENTIN 300 MG PO CAPS: ORAL_CAPSULE | ORAL | 1 refills | Status: DC

## 2020-05-29 MED ORDER — QUETIAPINE FUMARATE ER 400 MG PO TB24: 400.0000 mg | ORAL_TABLET | Freq: Every day | ORAL | 1 refills | Status: DC

## 2020-05-29 NOTE — Progress Notes (Signed)
[facility]  Patient ID: Marilyn Fowler,  MRN: [id number]  PCP: Patient, No Pcp Per  Date of Evaluation: 05/29/2020 Time spent:45 minutes  Chief Complaint:  Chief Complaint    Anxiety; Depression; Insomnia; Follow-up     Virtual Visit via Telehealth  I connected with patient by telephone, with their informed consent, and verified patient privacy and that I am speaking with the correct person using two identifiers.  I am private, in my office and the patient is at home.  I discussed the limitations, risks, security and privacy concerns of performing an evaluation and management service by telephone and the availability of in person appointments. I also discussed with the patient that there may be a patient responsible charge related to this service. The patient expressed understanding and agreed to proceed.   I discussed the assessment and treatment plan with the patient. The patient was provided an opportunity to ask questions and all were answered. The patient agreed with the plan and demonstrated an understanding of the instructions.   The patient was advised to call back or seek an in-person evaluation if the symptoms worsen or if the condition fails to improve as anticipated.  I provided 45 minutes of non-face-to-face time during this encounter.  HISTORY/CURRENT STATUS: HPI  Not doing well.  "Nerves are bad. Can't get calmed down, I don't know why. Had to go to the ER twice b/c my nerves were so bad. They gave me Ativan and that broke the PA." No triggers that she can think of. Has PA with SOB, increased heart rate up to 130s some times. Can last for hours. Worse in the evenings. No CP or headache. Hydroxyzine doesn't help at all. Trazodone doesn't help her sleep. More trouble staying asleep than falling asleep. Drinks caffeine all throughout the day and up to bed time. "No different than it ever has been."  Has panic attacks 2-3 times per week.  And most of the rest of  the time she is generally anxious.  "I really need something to help me with that.  Klonopin works good."  States she is able to enjoy things.  Energy and motivation are good but she does not feel like doing a lot due to the fact that she is anxious.  Does not want to go out and do anything because of the anxiety.  Sleep is a problem, more staying asleep.  When she wakes up her mind races and she cannot get thoughts out of her head.  Had suicidal thoughts a couple of months ago but no attempt.  Of importance of suicide attempt in June 2021 with intentional OD of Klonopin.  Denies suicidal or homicidal thoughts now.  Patient denies increased energy with decreased need for sleep, no increased talkativeness, no racing thoughts, no impulsivity or risky behaviors, no increased spending, no increased libido, no grandiosity, no increased irritability or anger, and no hallucinations.  Denies dizziness, syncope, seizures, numbness, tingling, tremor, tics, unsteady gait, slurred speech, confusion. Denies muscle or joint pain, stiffness, or dystonia.  Individual Medical History/ Review of Systems: Changes? :Yes    Past medications for mental health diagnoses include: Ambien, Klonopin (intentionally OD'ed 10/17/19, see ER notes) Wellbutrin, Prozac, Hydroxyzine is ineffective.   Allergies: Methocarbamol and Toradol [ketorolac tromethamine]  Current Medications:  Current Outpatient Medications:  .  amLODipine (NORVASC) 2.5 MG tablet, Take 1 tablet (2.5 mg total) by mouth daily., Disp: 30 tablet, Rfl: 1 .  Ascorbic Acid (VITAMIN C PO), Take 1 tablet by  mouth daily., Disp: , Rfl:  .  FLUoxetine (PROZAC) 40 MG capsule, Take 1 capsule (40 mg total) by mouth daily., Disp: 30 capsule, Rfl: 1 .  gabapentin (NEURONTIN) 300 MG capsule, 1 p.o. nightly for 2 nights, then 1 p.o. twice daily for 2 days, then increase to 1 p.o. 3 times daily., Disp: 90 capsule, Rfl: 1 .  ibuprofen (ADVIL) 200 MG tablet, Take 400 mg by mouth  every 4 (four) hours as needed for headache (pain)., Disp: , Rfl:  .  metoprolol succinate (TOPROL-XL) 25 MG 24 hr tablet, Take 1 tablet (25 mg total) by mouth daily., Disp: 30 tablet, Rfl: 1 .  mirtazapine (REMERON) 15 MG tablet, Take 0.5-1 tablets (7.5-15 mg total) by mouth at bedtime as needed., Disp: 30 tablet, Rfl: 1 .  QUEtiapine (SEROQUEL XR) 400 MG 24 hr tablet, Take 1 tablet (400 mg total) by mouth at bedtime., Disp: 30 tablet, Rfl: 1 .  Cholecalciferol (VITAMIN D3 PO), Take 1 tablet by mouth daily. (Patient not taking: Reported on 05/29/2020), Disp: , Rfl:  .  naproxen (NAPROSYN) 375 MG tablet, Take 1 tablet (375 mg total) by mouth 2 (two) times daily as needed for moderate pain. (Patient not taking: No sig reported), Disp: 60 tablet, Rfl: 0 .  phenazopyridine (PYRIDIUM) 200 MG tablet, Take 1 tablet (200 mg total) by mouth 3 (three) times daily as needed (urinary discomfort). (Patient not taking: No sig reported), Disp: 30 tablet, Rfl: 0 Medication Side Effects: none  Family Medical/ Social History: Changes? No  MENTAL HEALTH EXAM:  There were no vitals taken for this visit.There is no height or weight on file to calculate BMI.  General Appearance: Unable to assess  Eye Contact:  Unable to assess  Speech:  Clear and Coherent and Normal Rate  Volume:  Normal  Mood:  Euthymic  Affect:  Unable to assess  Thought Process:  Goal Directed and Descriptions of Associations: Intact  Orientation:  Full (Time, Place, and Person)  Thought Content: Logical   Suicidal Thoughts:  No  Homicidal Thoughts:  No  Memory:  WNL  Judgement:  Good  Insight:  Good  Psychomotor Activity:  Unable to assess  Concentration:  Concentration: Good  Recall:  Good  Fund of Knowledge: Good  Language: Good  Assets:  Desire for Improvement  ADL's:  Intact  Cognition: WNL  Prognosis:  Good   Most recent labs: 04/19/2020 CBC White count normal, hemoglobin 11.3, hematocrit 36.4, platelet count 437 BMP  glucose 114 otherwise normal  DIAGNOSES:    ICD-10-CM   1. Generalized anxiety disorder  F41.1   2. Bipolar II disorder, most recent episode major depressive (HCC)  F31.81   3. Insomnia, unspecified type  G47.00   4. Benzodiazepine abuse (HCC)  F13.10   5. History of suicide attempt  Z91.51     Receiving Psychotherapy: No    RECOMMENDATIONS:  PDMP was reviewed. I provided 45 minutes of nonface-to-face time during this encounter, in which we discussed good sleep hygiene.  This is including no caffeine after 2 PM, and due to the anxiety, I recommend weaning off of caffeine completely.  No electronics within 2 hours of the time she wants to go to sleep.  We also discussed the misuse of benzodiazepine even to the point of a suicide attempt last summer with Klonopin.  Therefore I will write no controlled substances for sleep or anxiety.  Different options were discussed and gabapentin will be prescribed for the anxiety.  Benefits and  risk were discussed along with possible side effects and she accepts.  We also discussed mirtazapine for sleep, also benefits, risks, side effects being discussed she accepts those as well.  This 45 minutes also included time spent before and after the appointment reviewing records. Records show 9 ER visits in the past 6 months, some for anxiety, she had a few migraines, as well.  See ER notes. Increase Prozac to 40 mg, 1 p.o. every morning. Start gabapentin 300 mg, 1 p.o. nightly for 2 nights, then 1 p.o. twice daily for 2 days, then increase to 1 p.o. 3 times daily. Discontinue hydroxyzine since it is ineffective now. Start mirtazapine 15 mg, 1/2-1 p.o. nightly as needed sleep. Discontinue Seroquel XR 300 mg. Start Seroquel XR 400 mg, 1 p.o. nightly. Discontinue trazodone. Patient was reminded to be compliant with appointments. Strongly recommend counseling. Return in 4 to 6 weeks.  Melony Overly, PA-C

## 2020-05-30 ENCOUNTER — Other Ambulatory Visit: Payer: Self-pay | Admitting: Physician Assistant

## 2020-06-09 ENCOUNTER — Emergency Department (HOSPITAL_BASED_OUTPATIENT_CLINIC_OR_DEPARTMENT_OTHER)
Admission: EM | Admit: 2020-06-09 | Discharge: 2020-06-09 | Disposition: A | Discharge status: Home / Self Care | Payer: Self-pay | Attending: Emergency Medicine | Admitting: Emergency Medicine

## 2020-06-09 ENCOUNTER — Encounter (HOSPITAL_BASED_OUTPATIENT_CLINIC_OR_DEPARTMENT_OTHER): Payer: Self-pay

## 2020-06-09 ENCOUNTER — Other Ambulatory Visit: Payer: Self-pay

## 2020-06-09 DIAGNOSIS — I10 Essential (primary) hypertension: Secondary | ICD-10-CM | POA: Insufficient documentation

## 2020-06-09 DIAGNOSIS — Z87891 Personal history of nicotine dependence: Secondary | ICD-10-CM | POA: Insufficient documentation

## 2020-06-09 DIAGNOSIS — Z79899 Other long term (current) drug therapy: Secondary | ICD-10-CM | POA: Insufficient documentation

## 2020-06-09 DIAGNOSIS — G43009 Migraine without aura, not intractable, without status migrainosus: Secondary | ICD-10-CM | POA: Insufficient documentation

## 2020-06-09 DIAGNOSIS — F410 Panic disorder [episodic paroxysmal anxiety] without agoraphobia: Secondary | ICD-10-CM | POA: Insufficient documentation

## 2020-06-09 MED ORDER — LORAZEPAM 2 MG/ML IJ SOLN
1.0000 mg | Freq: Once | INTRAMUSCULAR | Status: AC
  Administered 2020-06-09: 1 mg via INTRAVENOUS
  Filled 2020-06-09: qty 1

## 2020-06-09 MED ORDER — PROCHLORPERAZINE EDISYLATE 10 MG/2ML IJ SOLN
10.0000 mg | Freq: Once | INTRAMUSCULAR | Status: AC
  Administered 2020-06-09: 10 mg via INTRAVENOUS
  Filled 2020-06-09: qty 2

## 2020-06-09 MED ORDER — DIPHENHYDRAMINE HCL 50 MG/ML IJ SOLN
12.5000 mg | Freq: Once | INTRAMUSCULAR | Status: AC
  Administered 2020-06-09: 12.5 mg via INTRAVENOUS
  Filled 2020-06-09: qty 1

## 2020-06-09 MED ORDER — DEXAMETHASONE SODIUM PHOSPHATE 10 MG/ML IJ SOLN
10.0000 mg | Freq: Once | INTRAMUSCULAR | Status: AC
  Administered 2020-06-09: 10 mg via INTRAVENOUS
  Filled 2020-06-09: qty 1

## 2020-06-09 MED ORDER — LORAZEPAM 1 MG PO TABS
1.0000 mg | ORAL_TABLET | Freq: Once | ORAL | Status: AC
  Administered 2020-06-09: 1 mg via ORAL
  Filled 2020-06-09: qty 1

## 2020-06-09 NOTE — ED Provider Notes (Signed)
[facility] EMERGENCY DEPARTMENT Provider Note   CSN: [id number] Arrival date & time: 06/09/20  3762     History Chief Complaint  Patient presents with  . Headache  . Panic Attack    Marilyn Fowler is a 46 y.o. female.   Patient states she woke up at 6 AM today and "felt panicked". She took her medications and tried chamomile tea. Medication she took this morning include gabapentin, fluoxetine, Benadryl. She has a history of panic attacks that lead to migraines. She has a history of migraines without panic attacks as well. Headache is in the temples bilaterally and forehead. Nausea without vomiting. Photophobia with mild phonophobia. She states she was hyperventilating earlier but not having any chest pain. No dizziness, no fever or chills, no abdominal pain or diarrhea. She sees a provider for her psych medications, but does not have a PCP due to lack of insurance. She states usually when she comes into the ED for panic attacks/migraine she is given a "cocktail" and "2 mg of Ativan". She denies ever having a prescription for Ativan.        Past Medical History:  Diagnosis Date  . Alcohol abuse   . Anemia   . Anxiety   . Blood transfusion without reported diagnosis   . Depression   . Hemorrhoid   . Hypertension   . Migraine     Patient Active Problem List   Diagnosis Date Noted  . History of suicide attempt 05/29/2020  . Benzodiazepine abuse (HCC) 05/29/2020  . Insomnia 05/29/2020  . Sprained ankle 01/14/2020  . Bipolar depression (HCC) 01/13/2020  . Physical exam 08/07/2017  . Headache 06/30/2017  . Chronic right shoulder pain 09/04/2016  . Hypokalemia 04/30/2016  . Hypomagnesemia 04/30/2016  . Alcoholic hepatitis 04/30/2016  . Alcohol induced fatty liver 04/30/2016  . Alcohol abuse 04/29/2016  . Left medial knee pain 06/29/2015  . Acute bacterial sinusitis 10/11/2014  . Maxillary sinusitis, acute 09/14/2014  . Gastroenteritis 11/09/2013  . Pleuritic  chest pain 09/17/2013  . Chest pain 11/18/2012  . Morbid obesity (HCC) 09/29/2012  . HTN (hypertension) 07/12/2012  . Migraine 07/12/2012  . Bipolar II disorder, most recent episode major depressive (HCC) 07/11/2012    Past Surgical History:  Procedure Laterality Date  . HEMORRHOID SURGERY    . HERNIA REPAIR    . TONSILLECTOMY    . VENTRAL HERNIA REPAIR       OB History   No obstetric history on file.     Family History  Problem Relation Age of Onset  . Osteoarthritis Mother   . Diabetes Mother   . Hypertension Mother   . Depression Mother   . Hypertension Father   . Diabetes Father   . Asthma Son   . Deep vein thrombosis Maternal Grandmother   . Deep vein thrombosis Maternal Grandfather   . Stroke Neg Hx     Social History   Tobacco Use  . Smoking status: Former Smoker    Years: 10.00    Quit date: 04/12/2020    Years since quitting: 0.1  . Smokeless tobacco: Never Used  Vaping Use  . Vaping Use: Never used  Substance Use Topics  . Alcohol use: Not Currently  . Drug use: No    Home Medications Prior to Admission medications   Medication Sig Start Date End Date Taking? Authorizing Provider  amLODipine (NORVASC) 2.5 MG tablet Take 1 tablet (2.5 mg total) by mouth daily. 01/19/20   Clapacs, Jackquline Denmark, MD  Ascorbic Acid (VITAMIN C PO) Take 1 tablet by mouth daily.    [provider]  Cholecalciferol (VITAMIN D3 PO) Take 1 tablet by mouth daily. Patient not taking: Reported on 05/29/2020    [provider]  FLUoxetine (PROZAC) 40 MG capsule Take 1 capsule (40 mg total) by mouth daily. 05/29/20   Melony Overly T, PA-C  gabapentin (NEURONTIN) 300 MG capsule 1 p.o. nightly for 2 nights, then 1 p.o. twice daily for 2 days, then increase to 1 p.o. 3 times daily. 05/29/20   Melony Overly T, PA-C  ibuprofen (ADVIL) 200 MG tablet Take 400 mg by mouth every 4 (four) hours as needed for headache (pain).    [provider]  metoprolol succinate  (TOPROL-XL) 25 MG 24 hr tablet Take 1 tablet (25 mg total) by mouth daily. 01/19/20   Clapacs, Jackquline Denmark, MD  mirtazapine (REMERON) 15 MG tablet Take 0.5-1 tablets (7.5-15 mg total) by mouth at bedtime as needed. 05/29/20   Melony Overly T, PA-C  naproxen (NAPROSYN) 375 MG tablet Take 1 tablet (375 mg total) by mouth 2 (two) times daily as needed for moderate pain. Patient not taking: No sig reported 01/18/20   Clapacs, Jackquline Denmark, MD  phenazopyridine (PYRIDIUM) 200 MG tablet Take 1 tablet (200 mg total) by mouth 3 (three) times daily as needed (urinary discomfort). Patient not taking: No sig reported 01/18/20   Clapacs, Jackquline Denmark, MD  QUEtiapine (SEROQUEL XR) 400 MG 24 hr tablet Take 1 tablet (400 mg total) by mouth at bedtime. 05/29/20   Cherie Ouch, PA-C  buPROPion (WELLBUTRIN XL) 300 MG 24 hr tablet Take 1 tablet (300 mg total) by mouth daily. 04/03/20 04/03/20  Geoffery Lyons, MD    Allergies    Methocarbamol and Toradol [ketorolac tromethamine]  Review of Systems   Review of Systems  All other systems reviewed and are negative.   Physical Exam Updated Vital Signs BP (!) 156/103 (BP Location: Right Arm)   Pulse 99   Temp 98.4 F (36.9 C) (Oral)   Resp 16   Ht 5' (1.524 m)   Wt 127 kg   LMP 06/07/2020   SpO2 100%   BMI 54.68 kg/m   Physical Exam Constitutional:      Appearance: She is obese.  HENT:     Head: Normocephalic and atraumatic.     Mouth/Throat:     Mouth: Mucous membranes are moist.     Pharynx: Oropharynx is clear.  Eyes:     Extraocular Movements: Extraocular movements intact.     Right eye: Normal extraocular motion.     Left eye: Normal extraocular motion.     Pupils: Pupils are equal, round, and reactive to light.  Cardiovascular:     Rate and Rhythm: Normal rate and regular rhythm.     Heart sounds: Normal heart sounds. No murmur heard.   Pulmonary:     Effort: Pulmonary effort is normal. No respiratory distress.     Breath sounds: Normal breath sounds. No  wheezing.  Abdominal:     General: There is no distension.     Palpations: Abdomen is soft.  Musculoskeletal:        General: Normal range of motion.     Cervical back: Normal range of motion and neck supple.  Skin:    General: Skin is warm and dry.  Neurological:     Mental Status: She is alert and oriented to person, place, and time. Mental status is at baseline.  GCS: GCS eye subscore is 4. GCS verbal subscore is 5. GCS motor subscore is 6.     Cranial Nerves: No cranial nerve deficit or facial asymmetry.     Sensory: No sensory deficit.     Motor: No weakness.  Psychiatric:        Mood and Affect: Mood is anxious.     Comments: Patient appears restless. Moving hands and feet constantly.     ED Results / Procedures / Treatments   Labs (all labs ordered are listed, but only abnormal results are displayed) Labs Reviewed - No data to display  EKG None  Radiology No results found.  Procedures Procedures   Medications Ordered in ED Medications  LORazepam (ATIVAN) tablet 1 mg (1 mg Oral Given 06/09/20 0849)  prochlorperazine (COMPAZINE) injection 10 mg (10 mg Intravenous Given 06/09/20 0850)  diphenhydrAMINE (BENADRYL) injection 12.5 mg (12.5 mg Intravenous Given 06/09/20 0853)  dexamethasone (DECADRON) injection 10 mg (10 mg Intravenous Given 06/09/20 1018)  LORazepam (ATIVAN) injection 1 mg (1 mg Intravenous Given 06/09/20 1017)    ED Course  I have reviewed the triage vital signs and the nursing notes.  Pertinent labs & imaging results that were available during my care of the patient were reviewed by me and considered in my medical decision making (see chart for details).    MDM Rules/Calculators/A&P                          Patient with a history of panic attacks and migraines presents today with migraine induced by panic attack when she woke up this morning. Has already taken oral Benadryl, gabapentin and her antidepressants this morning at approximately 630.  Patient appears very anxious on exam. Otherwise physical exam is nonconcerning. Has been seen in the ED several times over the past few months for similar concerns. Upon chart review it appears she has had Ativan plus differing forms of migraine cocktails at these visits. We will give 12.5 mg of IV Benadryl given that she is already taken some orally this morning, 10 mg of Compazine, and oral 1 mg Ativan.  Reassessed pt after 40 minutes approximately.  States she did not feel like the medication helped.  We discussed what else had helped her at previous visits that had not been tried yet.  Then gave pt 10mg  decadron and 1mg  IV ativan.  After re-assessing 40 minutes after this, patient stated she was feeling much less anxious and headache was improved and able to go home. Pt appeared less anxious on exam this time.  Gave patient information on [facility] and encouraged her to establish with pcp.   Final Clinical Impression(s) / ED Diagnoses Final diagnoses:  Panic attack  Migraine without aura and without status migrainosus, not intractable    Rx / DC Orders ED Discharge Orders    None       , MD 06/09/20 1107    Sandre Kitty, MD 06/10/20 [phone]

## 2020-06-09 NOTE — Discharge Instructions (Signed)
I believe you would benefit from seeing a primary care provider regularly.  I would reach out to the [facility] listed in this paperwork.  You can also call the family medicine residency clinic to see if they are accepting new patients at [phone].

## 2020-06-09 NOTE — ED Notes (Signed)
ED Provider at bedside. 

## 2020-06-09 NOTE — ED Triage Notes (Signed)
Pt arrives with c/o panic attack and headache states she wants "something for the headache and to calm down".

## 2020-06-09 NOTE — ED Notes (Signed)
Pt stated that her Psychiatrist took Trazodone and added Remeron and increased the dose of her Prozac.  Pt is on this med for a week.

## 2020-06-12 ENCOUNTER — Other Ambulatory Visit: Payer: Self-pay | Admitting: Physician Assistant

## 2020-06-30 ENCOUNTER — Other Ambulatory Visit: Payer: Self-pay

## 2020-06-30 ENCOUNTER — Emergency Department (HOSPITAL_BASED_OUTPATIENT_CLINIC_OR_DEPARTMENT_OTHER)
Admission: EM | Admit: 2020-06-30 | Discharge: 2020-06-30 | Disposition: A | Discharge status: Home / Self Care | Payer: Self-pay | Attending: Emergency Medicine | Admitting: Emergency Medicine

## 2020-06-30 ENCOUNTER — Encounter (HOSPITAL_BASED_OUTPATIENT_CLINIC_OR_DEPARTMENT_OTHER): Payer: Self-pay | Admitting: Emergency Medicine

## 2020-06-30 DIAGNOSIS — Z87891 Personal history of nicotine dependence: Secondary | ICD-10-CM | POA: Insufficient documentation

## 2020-06-30 DIAGNOSIS — F410 Panic disorder [episodic paroxysmal anxiety] without agoraphobia: Secondary | ICD-10-CM

## 2020-06-30 DIAGNOSIS — F419 Anxiety disorder, unspecified: Secondary | ICD-10-CM | POA: Insufficient documentation

## 2020-06-30 DIAGNOSIS — Z79899 Other long term (current) drug therapy: Secondary | ICD-10-CM | POA: Insufficient documentation

## 2020-06-30 DIAGNOSIS — I10 Essential (primary) hypertension: Secondary | ICD-10-CM | POA: Insufficient documentation

## 2020-06-30 DIAGNOSIS — G43009 Migraine without aura, not intractable, without status migrainosus: Secondary | ICD-10-CM

## 2020-06-30 DIAGNOSIS — G43909 Migraine, unspecified, not intractable, without status migrainosus: Secondary | ICD-10-CM | POA: Insufficient documentation

## 2020-06-30 MED ORDER — LORAZEPAM 1 MG PO TABS
2.0000 mg | ORAL_TABLET | Freq: Once | ORAL | Status: AC
  Administered 2020-06-30: 2 mg via ORAL
  Filled 2020-06-30: qty 2

## 2020-06-30 MED ORDER — DIPHENHYDRAMINE HCL 50 MG/ML IJ SOLN
25.0000 mg | Freq: Once | INTRAMUSCULAR | Status: AC
  Administered 2020-06-30: 25 mg via INTRAMUSCULAR
  Filled 2020-06-30: qty 1

## 2020-06-30 MED ORDER — PROCHLORPERAZINE EDISYLATE 10 MG/2ML IJ SOLN
10.0000 mg | Freq: Once | INTRAMUSCULAR | Status: AC
  Administered 2020-06-30: 10 mg via INTRAMUSCULAR
  Filled 2020-06-30: qty 2

## 2020-06-30 NOTE — ED Provider Notes (Signed)
[facility] EMERGENCY DEPARTMENT Provider Note   CSN: [id number] Arrival date & time: 06/30/20  0139     History Chief Complaint  Patient presents with  . Anxiety    Marilyn Fowler is a 45 y.o. female.  Patient presents with complaints of migraine headache and panic attack. Patient reports that she was here earlier with her mother who got admitted to the hospital. She has not slept for the last couple of nights caring for her mother and has been under a great deal of stress. Since she got home she has developed a typical migraine headache without unusual features. Patient also has had progressively worsening anxiety and now suffered a panic attack.        Past Medical History:  Diagnosis Date  . Alcohol abuse   . Anemia   . Anxiety   . Blood transfusion without reported diagnosis   . Depression   . Hemorrhoid   . Hypertension   . Migraine     Patient Active Problem List   Diagnosis Date Noted  . History of suicide attempt 05/29/2020  . Benzodiazepine abuse (HCC) 05/29/2020  . Insomnia 05/29/2020  . Sprained ankle 01/14/2020  . Bipolar depression (HCC) 01/13/2020  . Physical exam 08/07/2017  . Headache 06/30/2017  . Chronic right shoulder pain 09/04/2016  . Hypokalemia 04/30/2016  . Hypomagnesemia 04/30/2016  . Alcoholic hepatitis 04/30/2016  . Alcohol induced fatty liver 04/30/2016  . Alcohol abuse 04/29/2016  . Left medial knee pain 06/29/2015  . Acute bacterial sinusitis 10/11/2014  . Maxillary sinusitis, acute 09/14/2014  . Gastroenteritis 11/09/2013  . Pleuritic chest pain 09/17/2013  . Chest pain 11/18/2012  . Morbid obesity (HCC) 09/29/2012  . HTN (hypertension) 07/12/2012  . Migraine 07/12/2012  . Bipolar II disorder, most recent episode major depressive (HCC) 07/11/2012    Past Surgical History:  Procedure Laterality Date  . HEMORRHOID SURGERY    . HERNIA REPAIR    . TONSILLECTOMY    . VENTRAL HERNIA REPAIR       OB History   No  obstetric history on file.     Family History  Problem Relation Age of Onset  . Osteoarthritis Mother   . Diabetes Mother   . Hypertension Mother   . Depression Mother   . Hypertension Father   . Diabetes Father   . Asthma Son   . Deep vein thrombosis Maternal Grandmother   . Deep vein thrombosis Maternal Grandfather   . Stroke Neg Hx     Social History   Tobacco Use  . Smoking status: Former Smoker    Years: 10.00    Quit date: 04/12/2020    Years since quitting: 0.2  . Smokeless tobacco: Never Used  Vaping Use  . Vaping Use: Never used  Substance Use Topics  . Alcohol use: Not Currently  . Drug use: No    Home Medications Prior to Admission medications   Medication Sig Start Date End Date Taking? Authorizing Provider  amLODipine (NORVASC) 2.5 MG tablet Take 1 tablet (2.5 mg total) by mouth daily. 01/19/20   Clapacs, Jackquline Denmark, MD  Ascorbic Acid (VITAMIN C PO) Take 1 tablet by mouth daily.    [provider]  Cholecalciferol (VITAMIN D3 PO) Take 1 tablet by mouth daily. Patient not taking: Reported on 05/29/2020    [provider]  FLUoxetine (PROZAC) 40 MG capsule Take 1 capsule (40 mg total) by mouth daily. 05/29/20   Melony Overly T, PA-C  gabapentin (NEURONTIN) 300  MG capsule 1 p.o. nightly for 2 nights, then 1 p.o. twice daily for 2 days, then increase to 1 p.o. 3 times daily. 05/29/20   Melony Overly T, PA-C  ibuprofen (ADVIL) 200 MG tablet Take 400 mg by mouth every 4 (four) hours as needed for headache (pain).    [provider]  metoprolol succinate (TOPROL-XL) 25 MG 24 hr tablet Take 1 tablet (25 mg total) by mouth daily. 01/19/20   Clapacs, Jackquline Denmark, MD  mirtazapine (REMERON) 15 MG tablet Take 0.5-1 tablets (7.5-15 mg total) by mouth at bedtime as needed. 05/29/20   Melony Overly T, PA-C  naproxen (NAPROSYN) 375 MG tablet Take 1 tablet (375 mg total) by mouth 2 (two) times daily as needed for moderate pain. Patient not taking: No sig reported  01/18/20   Clapacs, Jackquline Denmark, MD  phenazopyridine (PYRIDIUM) 200 MG tablet Take 1 tablet (200 mg total) by mouth 3 (three) times daily as needed (urinary discomfort). Patient not taking: No sig reported 01/18/20   Clapacs, Jackquline Denmark, MD  QUEtiapine (SEROQUEL XR) 400 MG 24 hr tablet Take 1 tablet (400 mg total) by mouth at bedtime. 05/29/20   Cherie Ouch, PA-C  buPROPion (WELLBUTRIN XL) 300 MG 24 hr tablet Take 1 tablet (300 mg total) by mouth daily. 04/03/20 04/03/20  Geoffery Lyons, MD    Allergies    Methocarbamol and Toradol [ketorolac tromethamine]  Review of Systems   Review of Systems  Neurological: Positive for headaches.  Psychiatric/Behavioral: The patient is nervous/anxious.   All other systems reviewed and are negative.   Physical Exam Updated Vital Signs BP 105/67 (BP Location: Right Wrist)   Pulse (!) 102   Temp 98.1 F (36.7 C) (Oral)   Resp (!) 24   Ht 5' (1.524 m)   Wt 127 kg   LMP 06/30/2020   SpO2 100%   BMI 54.68 kg/m   Physical Exam Vitals and nursing note reviewed.  Constitutional:      General: She is not in acute distress.    Appearance: Normal appearance. She is well-developed and well-nourished.  HENT:     Head: Normocephalic and atraumatic.     Right Ear: Hearing normal.     Left Ear: Hearing normal.     Nose: Nose normal.     Mouth/Throat:     Mouth: Oropharynx is clear and moist and mucous membranes are normal.  Eyes:     Extraocular Movements: EOM normal.     Conjunctiva/sclera: Conjunctivae normal.     Pupils: Pupils are equal, round, and reactive to light.  Cardiovascular:     Rate and Rhythm: Regular rhythm.     Heart sounds: S1 normal and S2 normal. No murmur heard. No friction rub. No gallop.   Pulmonary:     Effort: Pulmonary effort is normal. No respiratory distress.     Breath sounds: Normal breath sounds.  Chest:     Chest wall: No tenderness.  Abdominal:     General: Bowel sounds are normal.     Palpations: Abdomen is soft.  There is no hepatosplenomegaly.     Tenderness: There is no abdominal tenderness. There is no guarding or rebound. Negative signs include Murphy's sign and McBurney's sign.     Hernia: No hernia is present.  Musculoskeletal:        General: Normal range of motion.     Cervical back: Normal range of motion and neck supple.  Skin:    General: Skin is warm, dry  and intact.     Findings: No rash.     Nails: There is no cyanosis.  Neurological:     Mental Status: She is alert and oriented to person, place, and time.     GCS: GCS eye subscore is 4. GCS verbal subscore is 5. GCS motor subscore is 6.     Cranial Nerves: No cranial nerve deficit.     Sensory: No sensory deficit.     Coordination: Coordination normal.     Deep Tendon Reflexes: Strength normal.  Psychiatric:        Mood and Affect: Mood and affect normal.        Speech: Speech normal.        Behavior: Behavior normal.        Thought Content: Thought content normal.     ED Results / Procedures / Treatments   Labs (all labs ordered are listed, but only abnormal results are displayed) Labs Reviewed - No data to display  EKG None  Radiology No results found.  Procedures Procedures   Medications Ordered in ED Medications  LORazepam (ATIVAN) tablet 2 mg (has no administration in time range)  prochlorperazine (COMPAZINE) injection 10 mg (has no administration in time range)  diphenhydrAMINE (BENADRYL) injection 25 mg (has no administration in time range)    ED Course  I have reviewed the triage vital signs and the nursing notes.  Pertinent labs & imaging results that were available during my care of the patient were reviewed by me and considered in my medical decision making (see chart for details).    MDM Rules/Calculators/A&P                          Patient presents to the emergency department for evaluation of migraine headache. Patient is currently experiencing a headache that is similar to previous  migraines. Patient does not have any unusual features compared to previous migraines. Patient has normal neurologic examination. There are no unusual features, such as unusual intensity or sudden onset. As this headache is similar to previous migraines, there is no concern for subarachnoid hemorrhage or other etiology. Patient therefore does not require imaging. Patient treated as migraine headache.  Final Clinical Impression(s) / ED Diagnoses Final diagnoses:  Anxiety attack  Migraine without aura and without status migrainosus, not intractable    Rx / DC Orders ED Discharge Orders    None       Shellee Streng, Canary Brim, MD 06/30/20 [phone]

## 2020-06-30 NOTE — ED Triage Notes (Signed)
Patient presents with complaints of "panic attack" and headache; states took Seroquel, Remeron,and tylenol pta with no relief. Patient was here earlier with her mother who was admitted.

## 2020-07-05 ENCOUNTER — Other Ambulatory Visit: Payer: Self-pay

## 2020-07-05 ENCOUNTER — Ambulatory Visit (INDEPENDENT_AMBULATORY_CARE_PROVIDER_SITE_OTHER): Payer: Self-pay | Admitting: Physician Assistant

## 2020-07-05 ENCOUNTER — Encounter: Payer: Self-pay | Admitting: Physician Assistant

## 2020-07-05 DIAGNOSIS — G4700 Insomnia, unspecified: Secondary | ICD-10-CM

## 2020-07-05 DIAGNOSIS — Z9151 Personal history of suicidal behavior: Secondary | ICD-10-CM

## 2020-07-05 DIAGNOSIS — F3181 Bipolar II disorder: Secondary | ICD-10-CM

## 2020-07-05 DIAGNOSIS — F411 Generalized anxiety disorder: Secondary | ICD-10-CM

## 2020-07-05 DIAGNOSIS — F1310 Sedative, hypnotic or anxiolytic abuse, uncomplicated: Secondary | ICD-10-CM

## 2020-07-05 MED ORDER — QUETIAPINE FUMARATE ER 300 MG PO TB24: 600.0000 mg | ORAL_TABLET | Freq: Every day | ORAL | 1 refills | Status: DC

## 2020-07-05 MED ORDER — MIRTAZAPINE 30 MG PO TABS: 30.0000 mg | ORAL_TABLET | Freq: Every day | ORAL | 1 refills | Status: DC

## 2020-07-05 MED ORDER — GABAPENTIN 400 MG PO CAPS: 400.0000 mg | ORAL_CAPSULE | Freq: Three times a day (TID) | ORAL | 1 refills | Status: DC

## 2020-07-05 NOTE — Progress Notes (Signed)
[facility]  Patient ID: Marilyn Fowler,  MRN: [id number]  PCP: Patient, No Pcp Per  Date of Evaluation: 07/05/2020 Time spent:40 minutes  Chief Complaint:  Chief Complaint    Anxiety; Depression; Insomnia       HISTORY/CURRENT STATUS: HPI  Still very anxious. Accompanied by Mom.  Racing thoughts, can't sit still, has to be moving, goes out to drive in the middle of the night to calm down, might have to do it 6 times at night. Trouble sleeping, wakes up around 3 am, after 4 hours of sleep, had to go to ER twice since LOV b/c migraines and PA. Will get SOB, have chest tightness and get sweaty. Nothing helps until she's given Ativan at the hospital.  Has been under a lot of stress lately, thought they were going to be evicted, her adult son was giving her trouble but he has now moved out of town so that is not a stressor anymore, and she has a new job lined up starting Monday.  "I need to start back on my Klonopin.  That is all that helped me."  States that those stressors are no longer in her life the anxiety will improve.  Again asks for Klonopin. "I brought my Mom here and she'll tell you I need my Klonopin."  Doesn't like the way the Prozac makes her feel. "I can't explain it. But it's not helping." Then tells me she feels better about herself, is happy she is starting a new job on Monday at [employer]. Able to enjoy things, energy and motivation are good. Not isolating, not crying easily, no SI/HI.    Patient denies increased energy with decreased need for sleep, no increased talkativeness, no impulsivity or risky behaviors, no increased spending, no increased libido, no grandiosity, no increased irritability or anger, no paranoia, and no hallucinations.  Denies dizziness, syncope, seizures, numbness, tingling, tremor, tics, unsteady gait, slurred speech, confusion. Denies muscle or joint pain, stiffness, or dystonia.  Individual Medical History/ Review of Systems:  Changes? :Yes  Has had 2 ER visits since her last appointment with me.  Those records were reviewed.  Past medications for mental health diagnoses include: Ambien, Klonopin (intentionally OD'ed 10/17/19, see ER notes) Wellbutrin, Prozac, Hydroxyzine is ineffective.   Allergies: Methocarbamol and Toradol [ketorolac tromethamine]  Current Medications:  Current Outpatient Medications:  .  amLODipine (NORVASC) 2.5 MG tablet, Take 1 tablet (2.5 mg total) by mouth daily., Disp: 30 tablet, Rfl: 1 .  Ascorbic Acid (VITAMIN C PO), Take 1 tablet by mouth daily., Disp: , Rfl:  .  Cholecalciferol (VITAMIN D3 PO), Take 1 tablet by mouth daily., Disp: , Rfl:  .  gabapentin (NEURONTIN) 400 MG capsule, Take 1 capsule (400 mg total) by mouth 3 (three) times daily., Disp: 90 capsule, Rfl: 1 .  ibuprofen (ADVIL) 200 MG tablet, Take 400 mg by mouth every 4 (four) hours as needed for headache (pain)., Disp: , Rfl:  .  metoprolol succinate (TOPROL-XL) 25 MG 24 hr tablet, Take 1 tablet (25 mg total) by mouth daily., Disp: 30 tablet, Rfl: 1 .  mirtazapine (REMERON) 30 MG tablet, Take 1 tablet (30 mg total) by mouth at bedtime., Disp: 30 tablet, Rfl: 1 .  QUEtiapine (SEROQUEL XR) 300 MG 24 hr tablet, Take 2 tablets (600 mg total) by mouth at bedtime., Disp: 60 tablet, Rfl: 1 .  naproxen (NAPROSYN) 375 MG tablet, Take 1 tablet (375 mg total) by mouth 2 (two) times daily as needed for  moderate pain. (Patient not taking: No sig reported), Disp: 60 tablet, Rfl: 0 .  phenazopyridine (PYRIDIUM) 200 MG tablet, Take 1 tablet (200 mg total) by mouth 3 (three) times daily as needed (urinary discomfort). (Patient not taking: No sig reported), Disp: 30 tablet, Rfl: 0 Medication Side Effects: none  Family Medical/ Social History: Changes? No  MENTAL HEALTH EXAM:  Last menstrual period 06/30/2020.There is no height or weight on file to calculate BMI.  General Appearance: Casual, Fairly Groomed and Obese  Eye Contact:  Good   Speech:  Clear and Coherent, Normal Rate and Talkative  Volume:  Normal  Mood:  Euthymic  Affect:  Anxious  Thought Process:  Goal Directed and Descriptions of Associations: Intact  Orientation:  Full (Time, Place, and Person)  Thought Content: Logical   Suicidal Thoughts:  No  Homicidal Thoughts:  No  Memory:  WNL  Judgement:  Good  Insight:  Good  Psychomotor Activity:  Normal  Concentration:  Concentration: Good  Recall:  Good  Fund of Knowledge: Good  Language: Good  Assets:  Desire for Improvement  ADL's:  Intact  Cognition: WNL  Prognosis:  Good   Most recent labs: 04/19/2020 CBC White count normal, hemoglobin 11.3, hematocrit 36.4, platelet count 437 BMP glucose 114 otherwise normal  DIAGNOSES:    ICD-10-CM   1. Generalized anxiety disorder  F41.1   2. Benzodiazepine abuse (HCC)  F13.10   3. History of suicide attempt  Z91.51   4. Bipolar II disorder, most recent episode major depressive (HCC)  F31.81   5. Insomnia, unspecified type  G47.00     Receiving Psychotherapy: No    RECOMMENDATIONS:  PDMP was reviewed. I provided 40 minutes of face-to-face time during this encounter, including time before and after the visit in review of ER notes.  I reiterated that I am not prescribing a controlled substance.  There are other ways to treat anxiety.  Benzodiazepines are not the only thing that will help in with her history of intentional overdose with benzodiazepines, I will not prescribe those.  Patient states her mom would be in control of them but my answer is still no. Recommend increasing the Seroquel and gabapentin.  Both of those will help sleep and anxiety. Increasing mirtazapine should help with anxiety and depression although the latter is not much of a problem right now.  Increasing the dose over 15 mg usually does not help sleep any more than a lower dose however. She states Prozac is not helping and it makes her feel funny.  Unable to give me more specific  symptoms. Discontinue Prozac. Increase gabapentin to 400 mg, 1 p.o. 3 times daily. Increase mirtazapine to 30 mg 1 p.o. nightly. Increase Seroquel XR 300 mg, 2 p.o. nightly. Strongly recommend counseling. Return in 2 months.  Melony Overly, PA-C

## 2020-07-13 ENCOUNTER — Emergency Department (HOSPITAL_BASED_OUTPATIENT_CLINIC_OR_DEPARTMENT_OTHER)
Admission: EM | Admit: 2020-07-13 | Discharge: 2020-07-13 | Disposition: A | Discharge status: Home / Self Care | Payer: Self-pay | Attending: Emergency Medicine | Admitting: Emergency Medicine

## 2020-07-13 ENCOUNTER — Encounter (HOSPITAL_BASED_OUTPATIENT_CLINIC_OR_DEPARTMENT_OTHER): Payer: Self-pay | Admitting: Emergency Medicine

## 2020-07-13 ENCOUNTER — Other Ambulatory Visit: Payer: Self-pay

## 2020-07-13 ENCOUNTER — Emergency Department (HOSPITAL_BASED_OUTPATIENT_CLINIC_OR_DEPARTMENT_OTHER): Payer: Self-pay

## 2020-07-13 DIAGNOSIS — R0789 Other chest pain: Secondary | ICD-10-CM | POA: Insufficient documentation

## 2020-07-13 DIAGNOSIS — R0602 Shortness of breath: Secondary | ICD-10-CM | POA: Insufficient documentation

## 2020-07-13 DIAGNOSIS — D509 Iron deficiency anemia, unspecified: Secondary | ICD-10-CM | POA: Insufficient documentation

## 2020-07-13 DIAGNOSIS — F419 Anxiety disorder, unspecified: Secondary | ICD-10-CM | POA: Insufficient documentation

## 2020-07-13 DIAGNOSIS — R55 Syncope and collapse: Secondary | ICD-10-CM | POA: Insufficient documentation

## 2020-07-13 DIAGNOSIS — R600 Localized edema: Secondary | ICD-10-CM | POA: Insufficient documentation

## 2020-07-13 DIAGNOSIS — Z87891 Personal history of nicotine dependence: Secondary | ICD-10-CM | POA: Insufficient documentation

## 2020-07-13 DIAGNOSIS — Z79899 Other long term (current) drug therapy: Secondary | ICD-10-CM | POA: Insufficient documentation

## 2020-07-13 DIAGNOSIS — R000 Tachycardia, unspecified: Secondary | ICD-10-CM | POA: Insufficient documentation

## 2020-07-13 DIAGNOSIS — R42 Dizziness and giddiness: Secondary | ICD-10-CM | POA: Insufficient documentation

## 2020-07-13 DIAGNOSIS — I10 Essential (primary) hypertension: Secondary | ICD-10-CM | POA: Insufficient documentation

## 2020-07-13 LAB — D-DIMER, QUANTITATIVE: D-Dimer, Quant: 0.29 ug/mL-FEU (ref 0.00–0.50)

## 2020-07-13 LAB — BASIC METABOLIC PANEL
Anion gap: 13 (ref 5–15)
BUN: 17 mg/dL (ref 6–20)
CO2: 21 mmol/L — ABNORMAL LOW (ref 22–32)
Calcium: 8.6 mg/dL — ABNORMAL LOW (ref 8.9–10.3)
Chloride: 104 mmol/L (ref 98–111)
Creatinine, Ser: 0.83 mg/dL (ref 0.44–1.00)
GFR, Estimated: >60 mL/min (ref >60)
Glucose, Bld: 140 mg/dL — ABNORMAL HIGH (ref 70–99)
Potassium: 3.2 mmol/L — ABNORMAL LOW (ref 3.5–5.1)
Sodium: 138 mmol/L (ref 135–145)

## 2020-07-13 LAB — URINALYSIS, ROUTINE W REFLEX MICROSCOPIC
Bilirubin Urine: NEGATIVE
Glucose, UA: NEGATIVE mg/dL
Hgb urine dipstick: NEGATIVE
Ketones, ur: NEGATIVE mg/dL
Leukocytes,Ua: NEGATIVE
Nitrite: NEGATIVE
Protein, ur: NEGATIVE mg/dL
Specific Gravity, Urine: 1.02 (ref 1.005–1.030)
pH: 7 (ref 5.0–8.0)

## 2020-07-13 LAB — TROPONIN I (HIGH SENSITIVITY)
Troponin I (High Sensitivity): 10 ng/L (ref <18)
Troponin I (High Sensitivity): 8 ng/L (ref <18)

## 2020-07-13 LAB — CBC
HCT: 27.7 % — ABNORMAL LOW (ref 36.0–46.0)
Hemoglobin: 8 g/dL — ABNORMAL LOW (ref 12.0–15.0)
MCH: 21.4 pg — ABNORMAL LOW (ref 26.0–34.0)
MCHC: 28.9 g/dL — ABNORMAL LOW (ref 30.0–36.0)
MCV: 74.1 fL — ABNORMAL LOW (ref 80.0–100.0)
Platelets: 276 10*3/uL (ref 150–400)
RBC: 3.74 MIL/uL — ABNORMAL LOW (ref 3.87–5.11)
RDW: 17.6 % — ABNORMAL HIGH (ref 11.5–15.5)
WBC: 6.7 10*3/uL (ref 4.0–10.5)
nRBC: 0 % (ref 0.0–0.2)

## 2020-07-13 LAB — PREGNANCY, URINE: Preg Test, Ur: NEGATIVE

## 2020-07-13 MED ORDER — FERROUS SULFATE 325 (65 FE) MG PO TABS: 325.0000 mg | ORAL_TABLET | Freq: Every day | ORAL | 0 refills | Status: DC

## 2020-07-13 MED ORDER — FENTANYL CITRATE (PF) 100 MCG/2ML IJ SOLN
50.0000 ug | Freq: Once | INTRAMUSCULAR | Status: AC
  Administered 2020-07-13: 50 ug via INTRAVENOUS
  Filled 2020-07-13: qty 2

## 2020-07-13 MED ORDER — SODIUM CHLORIDE 0.9 % IV BOLUS
500.0000 mL | Freq: Once | INTRAVENOUS | Status: AC
  Administered 2020-07-13: 500 mL via INTRAVENOUS

## 2020-07-13 NOTE — ED Provider Notes (Signed)
[facility] EMERGENCY DEPARTMENT Provider Note   CSN: [id number] Arrival date & time: 07/13/20  0747     History Chief Complaint  Patient presents with  . Chest Pain  . Shortness of Breath    Marilyn Fowler is a 45 y.o. female.  She is complaining of tightness in her chest, shortness of breath, difficulty getting a full breath that started last evening.  Pain in her left upper chest.  Causes her to be anxious and feel more short of breath.  She also states she feels bloated and think she is urinating less.  Swelling in her feet.  No leg pain.  Former smoker, denies any cocaine.  No known coronary disease.  The history is provided by the patient.  Chest Pain Pain location:  L chest Pain quality: tightness   Pain radiates to:  Does not radiate Pain severity:  Moderate Onset quality:  Gradual Duration:  12 hours Timing:  Constant Progression:  Unchanged Chronicity:  New Context: at rest   Relieved by:  Nothing Worsened by:  Deep breathing Ineffective treatments:  None tried Associated symptoms: anxiety, dizziness, lower extremity edema, near-syncope and shortness of breath   Associated symptoms: no abdominal pain, no cough, no diaphoresis, no fever, no headache, no nausea and no vomiting   Risk factors: hypertension and obesity   Risk factors: no smoking   Shortness of Breath Associated symptoms: chest pain   Associated symptoms: no abdominal pain, no cough, no diaphoresis, no fever, no headaches, no neck pain, no rash, no sore throat and no vomiting     HPI: A 45 year old patient with a history of hypertension and obesity presents for evaluation of chest pain. Initial onset of pain was more than 6 hours ago. The patient's chest pain is described as heaviness/pressure/tightness and is not worse with exertion. The patient's chest pain is middle- or left-sided, is not well-localized, is not sharp and does not radiate to the arms/jaw/neck. The patient does not complain of  nausea and denies diaphoresis. The patient has no history of stroke, has no history of peripheral artery disease, has not smoked in the past 90 days, denies any history of treated diabetes, has no relevant family history of coronary artery disease (first degree relative at less than age 101) and has no history of hypercholesterolemia.   Past Medical History:  Diagnosis Date  . Alcohol abuse   . Anemia   . Anxiety   . Blood transfusion without reported diagnosis   . Depression   . Hemorrhoid   . Hypertension   . Migraine     Patient Active Problem List   Diagnosis Date Noted  . History of suicide attempt 05/29/2020  . Benzodiazepine abuse (HCC) 05/29/2020  . Insomnia 05/29/2020  . Sprained ankle 01/14/2020  . Bipolar depression (HCC) 01/13/2020  . Physical exam 08/07/2017  . Headache 06/30/2017  . Chronic right shoulder pain 09/04/2016  . Hypokalemia 04/30/2016  . Hypomagnesemia 04/30/2016  . Alcoholic hepatitis 04/30/2016  . Alcohol induced fatty liver 04/30/2016  . Alcohol abuse 04/29/2016  . Left medial knee pain 06/29/2015  . Acute bacterial sinusitis 10/11/2014  . Maxillary sinusitis, acute 09/14/2014  . Gastroenteritis 11/09/2013  . Pleuritic chest pain 09/17/2013  . Chest pain 11/18/2012  . Morbid obesity (HCC) 09/29/2012  . HTN (hypertension) 07/12/2012  . Migraine 07/12/2012  . Bipolar II disorder, most recent episode major depressive (HCC) 07/11/2012    Past Surgical History:  Procedure Laterality Date  . HEMORRHOID SURGERY    .  HERNIA REPAIR    . TONSILLECTOMY    . VENTRAL HERNIA REPAIR       OB History   No obstetric history on file.     Family History  Problem Relation Age of Onset  . Osteoarthritis Mother   . Diabetes Mother   . Hypertension Mother   . Depression Mother   . Hypertension Father   . Diabetes Father   . Asthma Son   . Deep vein thrombosis Maternal Grandmother   . Deep vein thrombosis Maternal Grandfather   . Stroke Neg Hx      Social History   Tobacco Use  . Smoking status: Former Smoker    Years: 10.00    Quit date: 04/12/2020    Years since quitting: 0.2  . Smokeless tobacco: Never Used  Vaping Use  . Vaping Use: Never used  Substance Use Topics  . Alcohol use: Not Currently  . Drug use: No    Home Medications Prior to Admission medications   Medication Sig Start Date End Date Taking? Authorizing Provider  amLODipine (NORVASC) 2.5 MG tablet Take 1 tablet (2.5 mg total) by mouth daily. 01/19/20   Clapacs, Jackquline DenmarkJohn T, MD  Ascorbic Acid (VITAMIN C PO) Take 1 tablet by mouth daily.    [provider]  Cholecalciferol (VITAMIN D3 PO) Take 1 tablet by mouth daily.    [provider]  gabapentin (NEURONTIN) 400 MG capsule Take 1 capsule (400 mg total) by mouth 3 (three) times daily. 07/05/20   Melony OverlyHurst, Teresa T, PA-C  ibuprofen (ADVIL) 200 MG tablet Take 400 mg by mouth every 4 (four) hours as needed for headache (pain).    [provider]  metoprolol succinate (TOPROL-XL) 25 MG 24 hr tablet Take 1 tablet (25 mg total) by mouth daily. 01/19/20   Clapacs, Jackquline DenmarkJohn T, MD  mirtazapine (REMERON) 30 MG tablet Take 1 tablet (30 mg total) by mouth at bedtime. 07/05/20   Melony OverlyHurst, Teresa T, PA-C  naproxen (NAPROSYN) 375 MG tablet Take 1 tablet (375 mg total) by mouth 2 (two) times daily as needed for moderate pain. Patient not taking: No sig reported 01/18/20   Clapacs, Jackquline DenmarkJohn T, MD  phenazopyridine (PYRIDIUM) 200 MG tablet Take 1 tablet (200 mg total) by mouth 3 (three) times daily as needed (urinary discomfort). Patient not taking: No sig reported 01/18/20   Clapacs, Jackquline DenmarkJohn T, MD  QUEtiapine (SEROQUEL XR) 300 MG 24 hr tablet Take 2 tablets (600 mg total) by mouth at bedtime. 07/05/20   Cherie OuchHurst, Teresa T, PA-C  buPROPion (WELLBUTRIN XL) 300 MG 24 hr tablet Take 1 tablet (300 mg total) by mouth daily. 04/03/20 04/03/20  Geoffery Lyonselo, Douglas, MD    Allergies    Toradol [ketorolac tromethamine]  Review of Systems   Review  of Systems  Constitutional: Negative for diaphoresis and fever.  HENT: Negative for sore throat.   Eyes: Negative for visual disturbance.  Respiratory: Positive for shortness of breath. Negative for cough.   Cardiovascular: Positive for chest pain and near-syncope.  Gastrointestinal: Negative for abdominal pain, nausea and vomiting.  Genitourinary: Negative for dysuria.  Musculoskeletal: Negative for neck pain.  Skin: Negative for rash.  Neurological: Positive for dizziness. Negative for headaches.    Physical Exam Updated Vital Signs BP 107/76   Pulse (!) 102   Temp 98 F (36.7 C) (Oral)   Resp (!) 35   Ht 5' (1.524 m)   Wt (!) 146.1 kg   LMP 06/30/2020   SpO2 100%  BMI 62.89 kg/m   Physical Exam Vitals and nursing note reviewed.  Constitutional:      General: She is not in acute distress.    Appearance: She is well-developed. She is obese.  HENT:     Head: Normocephalic and atraumatic.  Eyes:     Conjunctiva/sclera: Conjunctivae normal.  Cardiovascular:     Rate and Rhythm: Regular rhythm. Tachycardia present.     Heart sounds: Normal heart sounds. No murmur heard.   Pulmonary:     Effort: Pulmonary effort is normal. Tachypnea present. No respiratory distress.     Breath sounds: Normal breath sounds. No stridor. No wheezing.  Abdominal:     Palpations: Abdomen is soft. There is no mass.     Tenderness: There is no abdominal tenderness. There is no guarding or rebound.  Musculoskeletal:        General: No tenderness. Normal range of motion.     Cervical back: Neck supple.     Right lower leg: No tenderness. No edema.     Left lower leg: No tenderness. No edema.  Skin:    General: Skin is warm and dry.     Capillary Refill: Capillary refill takes less than 2 seconds.  Neurological:     General: No focal deficit present.     Mental Status: She is alert.     GCS: GCS eye subscore is 4. GCS verbal subscore is 5. GCS motor subscore is 6.     ED Results /  Procedures / Treatments   Labs (all labs ordered are listed, but only abnormal results are displayed) Labs Reviewed  BASIC METABOLIC PANEL - Abnormal; Notable for the following components:      Result Value   Potassium 3.2 (*)    CO2 21 (*)    Glucose, Bld 140 (*)    Calcium 8.6 (*)    All other components within normal limits  CBC - Abnormal; Notable for the following components:   RBC 3.74 (*)    Hemoglobin 8.0 (*)    HCT 27.7 (*)    MCV 74.1 (*)    MCH 21.4 (*)    MCHC 28.9 (*)    RDW 17.6 (*)    All other components within normal limits  PREGNANCY, URINE  D-DIMER, QUANTITATIVE  URINALYSIS, ROUTINE W REFLEX MICROSCOPIC  TROPONIN I (HIGH SENSITIVITY)  TROPONIN I (HIGH SENSITIVITY)    EKG EKG Interpretation  Date/Time:  Thursday July 13 2020 08:00:42 EDT Ventricular Rate:  107 PR Interval:    QRS Duration: 84 QT Interval:  357 QTC Calculation: 477 R Axis:   -5 Text Interpretation: Normal sinus rhythm Low voltage, precordial leads Consider anterior infarct No significant change since prior 1/22 Confirmed by Meridee Score [phone]) on 07/13/2020 8:13:05 AM   Radiology DG Chest 2 View  Result Date: 07/13/2020 CLINICAL DATA:  Chest pain EXAM: CHEST - 2 VIEW COMPARISON:  09/23/2013 FINDINGS: Very low lung volumes. Vascular congestion. Left base atelectasis. Heart size likely within normal limits given the low volumes and technique. No acute bony abnormality. No visible effusions. IMPRESSION: Very low lung volumes with vascular congestion and left base atelectasis. Electronically Signed   By: Charlett Nose M.D.   On: 07/13/2020 08:56    Procedures Procedures   Medications Ordered in ED Medications  sodium chloride 0.9 % bolus 500 mL (0 mLs Intravenous Stopped 07/13/20 1130)  fentaNYL (SUBLIMAZE) injection 50 mcg (50 mcg Intravenous Given 07/13/20 1006)    ED Course  I have  reviewed the triage vital signs and the nursing notes.  Pertinent labs & imaging results that  were available during my care of the patient were reviewed by me and considered in my medical decision making (see chart for details).  Clinical Course as of 07/13/20 2017  Thu Jul 13, 2020  0900 Chest x-ray with poor inspiratory effort no gross infiltrates.  Awaiting radiology reading [MB]  1018 Patient's lab work showing her to be anemic compared to baseline's.  I reviewed this with her.  She said she has been anemic in the past and has needed blood transfusion.  She also states she has been chewing on ice more recently.  As far as bleeding she said she sometimes has some blood on the toilet paper when she wipes from her known hemorrhoids. [MB]  1039 Troponins flat.  D-dimer not elevated doubt PE.  Waiting on urinalysis.  Will give some IV fluids and IV pain medicine. [MB]  1114 Reviewed results with patient.  She is comfortable plan for starting on some iron supplementation and following up with her primary care doctor.  Return instructions discussed [MB]    Clinical Course User Index [MB] Terrilee Files, MD   MDM Rules/Calculators/A&P HEAR Score: 2                       This patient complains of chest tightness and shortness of breath; this involves an extensive number of treatment Options and is a complaint that carries with it a high risk of complications and Morbidity. The differential includes pneumonia, PE pneumothorax, anxiety, vascular metabolic derangement, arrhythmia  I ordered, reviewed and interpreted labs, which included CBC with normal white count, hemoglobin lower than baseline, chemistries with elevated glucose low potassium low bicarb, troponins flat, urinalysis and pregnancy test negative, D-dimer not elevated I ordered medication IV fluids IV pain medicine I ordered imaging studies which included chest x-ray and I independently    visualized and interpreted imaging which showed poor inspiratory effort Previous records obtained and reviewed in epic, patient has multiple  visits for chest pain shortness of breath and anxiety  After the interventions stated above, I reevaluated the patient and found patient's vital signs to have improved.  She is comfortable plan with outpatient follow-up with her primary care doctor.  Return instructions discussed.   Final Clinical Impression(s) / ED Diagnoses Final diagnoses:  Atypical chest pain  Shortness of breath  Iron deficiency anemia, unspecified iron deficiency anemia type    Rx / DC Orders ED Discharge Orders         Ordered    ferrous sulfate 325 (65 FE) MG tablet  Daily        07/13/20 1115           Terrilee Files, MD 07/13/20 2021

## 2020-07-13 NOTE — ED Triage Notes (Addendum)
Pt states she is having chest tightness with SOB since last night 9 pm.  Pt also states she feels swollen and cannot take a deep breath, pt states her lungs feel "prickly".  No N/V.  No diaphoresis.  Some lightheadedness.  No numbness or tingling.  No recent travel.  No leg pain.  No hormone use.  Pt is hyperventilating.  Encouraged pt to slow her breathing.

## 2020-07-13 NOTE — Discharge Instructions (Addendum)
You were seen in the emergency department for chest pain shortness of breath.  You had blood work EKG and a chest x-ray that did not show any obvious cause of your symptoms.  You were noted to be anemic.  We are starting you on some iron supplementation.  Please contact your primary care doctor for close follow-up and repeat blood work.  Return to the emergency department for any worsening or concerning symptoms

## 2020-07-13 NOTE — ED Notes (Signed)
Pt made aware of need for urine sample, unable to void at this time.  

## 2020-07-24 ENCOUNTER — Other Ambulatory Visit: Payer: Self-pay | Admitting: Physician Assistant

## 2020-07-27 ENCOUNTER — Emergency Department (HOSPITAL_BASED_OUTPATIENT_CLINIC_OR_DEPARTMENT_OTHER)
Admission: EM | Admit: 2020-07-27 | Discharge: 2020-07-27 | Disposition: A | Discharge status: Home / Self Care | Payer: Self-pay | Attending: Emergency Medicine | Admitting: Emergency Medicine

## 2020-07-27 ENCOUNTER — Encounter (HOSPITAL_BASED_OUTPATIENT_CLINIC_OR_DEPARTMENT_OTHER): Payer: Self-pay | Admitting: Emergency Medicine

## 2020-07-27 ENCOUNTER — Other Ambulatory Visit: Payer: Self-pay | Admitting: Physician Assistant

## 2020-07-27 ENCOUNTER — Other Ambulatory Visit: Payer: Self-pay

## 2020-07-27 DIAGNOSIS — I10 Essential (primary) hypertension: Secondary | ICD-10-CM | POA: Insufficient documentation

## 2020-07-27 DIAGNOSIS — Z79899 Other long term (current) drug therapy: Secondary | ICD-10-CM | POA: Insufficient documentation

## 2020-07-27 DIAGNOSIS — F419 Anxiety disorder, unspecified: Secondary | ICD-10-CM | POA: Insufficient documentation

## 2020-07-27 DIAGNOSIS — R000 Tachycardia, unspecified: Secondary | ICD-10-CM | POA: Insufficient documentation

## 2020-07-27 DIAGNOSIS — Z87891 Personal history of nicotine dependence: Secondary | ICD-10-CM | POA: Insufficient documentation

## 2020-07-27 LAB — CBC
HCT: 27.5 % — ABNORMAL LOW (ref 36.0–46.0)
Hemoglobin: 8.3 g/dL — ABNORMAL LOW (ref 12.0–15.0)
MCH: 21.3 pg — ABNORMAL LOW (ref 26.0–34.0)
MCHC: 30.2 g/dL (ref 30.0–36.0)
MCV: 70.5 fL — ABNORMAL LOW (ref 80.0–100.0)
Platelets: 278 10*3/uL (ref 150–400)
RBC: 3.9 MIL/uL (ref 3.87–5.11)
RDW: 17.6 % — ABNORMAL HIGH (ref 11.5–15.5)
WBC: 9.7 10*3/uL (ref 4.0–10.5)
nRBC: 0 % (ref 0.0–0.2)

## 2020-07-27 LAB — TROPONIN I (HIGH SENSITIVITY): Troponin I (High Sensitivity): 3 ng/L (ref <18)

## 2020-07-27 LAB — BASIC METABOLIC PANEL
Anion gap: 11 (ref 5–15)
BUN: 12 mg/dL (ref 6–20)
CO2: 18 mmol/L — ABNORMAL LOW (ref 22–32)
Calcium: 9.1 mg/dL (ref 8.9–10.3)
Chloride: 104 mmol/L (ref 98–111)
Creatinine, Ser: 0.79 mg/dL (ref 0.44–1.00)
GFR, Estimated: >60 mL/min (ref >60)
Glucose, Bld: 99 mg/dL (ref 70–99)
Potassium: 4.3 mmol/L (ref 3.5–5.1)
Sodium: 133 mmol/L — ABNORMAL LOW (ref 135–145)

## 2020-07-27 MED ORDER — PROCHLORPERAZINE MALEATE 10 MG PO TABS
10.0000 mg | ORAL_TABLET | Freq: Once | ORAL | Status: AC
  Administered 2020-07-27: 10 mg via ORAL
  Filled 2020-07-27: qty 1

## 2020-07-27 MED ORDER — LORAZEPAM 2 MG/ML IJ SOLN
1.0000 mg | Freq: Once | INTRAMUSCULAR | Status: AC
  Administered 2020-07-27: 1 mg via INTRAMUSCULAR
  Filled 2020-07-27: qty 1

## 2020-07-27 NOTE — ED Triage Notes (Signed)
Pt states she has been having a panic attack for the past 2 hours  Pt states she is unable to get herself calmed down  Pt states she comes here when she cannot get them controlled at home  Pt is c/o shortness of breath and dizziness  Encouraging pt to breathe slow and steady

## 2020-07-27 NOTE — ED Notes (Signed)
Calm now, no longer anxious

## 2020-07-27 NOTE — Discharge Instructions (Addendum)
Follow-up with your primary care doctor.  Return if you develop difficulty breathing, chest pain or other new concerning symptom.

## 2020-07-27 NOTE — ED Provider Notes (Signed)
[facility] EMERGENCY DEPARTMENT Provider Note   CSN: [id number] Arrival date & time: 07/27/20  2134     History Chief Complaint  Patient presents with  . Panic Attack    Marilyn Fowler is a 45 y.o. female.  Presents to ER with concern for panic attack.  Patient states that over the last couple hours she has felt very anxious, felt like she cannot calm herself down.  States she also feels somewhat short of breath.  Not having any associated chest pain.  HPI     Past Medical History:  Diagnosis Date  . Alcohol abuse   . Anemia   . Anemia   . Anxiety   . Blood transfusion without reported diagnosis   . Depression   . Hemorrhoid   . Hypertension   . Migraine     Patient Active Problem List   Diagnosis Date Noted  . History of suicide attempt 05/29/2020  . Benzodiazepine abuse (HCC) 05/29/2020  . Insomnia 05/29/2020  . Sprained ankle 01/14/2020  . Bipolar depression (HCC) 01/13/2020  . Physical exam 08/07/2017  . Headache 06/30/2017  . Chronic right shoulder pain 09/04/2016  . Hypokalemia 04/30/2016  . Hypomagnesemia 04/30/2016  . Alcoholic hepatitis 04/30/2016  . Alcohol induced fatty liver 04/30/2016  . Alcohol abuse 04/29/2016  . Left medial knee pain 06/29/2015  . Acute bacterial sinusitis 10/11/2014  . Maxillary sinusitis, acute 09/14/2014  . Gastroenteritis 11/09/2013  . Pleuritic chest pain 09/17/2013  . Chest pain 11/18/2012  . Morbid obesity (HCC) 09/29/2012  . HTN (hypertension) 07/12/2012  . Migraine 07/12/2012  . Bipolar II disorder, most recent episode major depressive (HCC) 07/11/2012    Past Surgical History:  Procedure Laterality Date  . HEMORRHOID SURGERY    . HERNIA REPAIR    . TONSILLECTOMY    . VENTRAL HERNIA REPAIR       OB History   No obstetric history on file.     Family History  Problem Relation Age of Onset  . Osteoarthritis Mother   . Diabetes Mother   . Hypertension Mother   . Depression Mother   .  Hypertension Father   . Diabetes Father   . Asthma Son   . Deep vein thrombosis Maternal Grandmother   . Deep vein thrombosis Maternal Grandfather   . Stroke Neg Hx     Social History   Tobacco Use  . Smoking status: Former Smoker    Years: 10.00    Quit date: 04/12/2020    Years since quitting: 0.2  . Smokeless tobacco: Never Used  Vaping Use  . Vaping Use: Never used  Substance Use Topics  . Alcohol use: Not Currently  . Drug use: No    Home Medications Prior to Admission medications   Medication Sig Start Date End Date Taking? Authorizing Provider  amLODipine (NORVASC) 2.5 MG tablet Take 1 tablet (2.5 mg total) by mouth daily. 01/19/20   Clapacs, Jackquline Denmark, MD  Ascorbic Acid (VITAMIN C PO) Take 1 tablet by mouth daily.    [provider]  Cholecalciferol (VITAMIN D3 PO) Take 1 tablet by mouth daily.    [provider]  ferrous sulfate 325 (65 FE) MG tablet Take 1 tablet (325 mg total) by mouth daily. 07/13/20   Terrilee Files, MD  gabapentin (NEURONTIN) 400 MG capsule Take 1 capsule (400 mg total) by mouth 3 (three) times daily. 07/05/20   Melony Overly T, PA-C  ibuprofen (ADVIL) 200 MG tablet Take 400 mg by  mouth every 4 (four) hours as needed for headache (pain).    [provider]  metoprolol succinate (TOPROL-XL) 25 MG 24 hr tablet Take 1 tablet (25 mg total) by mouth daily. 01/19/20   Clapacs, Jackquline Denmark, MD  mirtazapine (REMERON) 30 MG tablet Take 1 tablet (30 mg total) by mouth at bedtime. 07/05/20   Melony Overly T, PA-C  naproxen (NAPROSYN) 375 MG tablet Take 1 tablet (375 mg total) by mouth 2 (two) times daily as needed for moderate pain. Patient not taking: No sig reported 01/18/20   Clapacs, Jackquline Denmark, MD  phenazopyridine (PYRIDIUM) 200 MG tablet Take 1 tablet (200 mg total) by mouth 3 (three) times daily as needed (urinary discomfort). Patient not taking: No sig reported 01/18/20   Clapacs, Jackquline Denmark, MD  QUEtiapine (SEROQUEL XR) 300 MG 24 hr tablet Take  2 tablets (600 mg total) by mouth at bedtime. 07/05/20   Cherie Ouch, PA-C  buPROPion (WELLBUTRIN XL) 300 MG 24 hr tablet Take 1 tablet (300 mg total) by mouth daily. 04/03/20 04/03/20  Geoffery Lyons, MD    Allergies    Toradol [ketorolac tromethamine]  Review of Systems   Review of Systems  Constitutional: Negative for chills and fever.  HENT: Negative for ear pain and sore throat.   Eyes: Negative for pain and visual disturbance.  Respiratory: Negative for cough and shortness of breath.   Cardiovascular: Negative for chest pain and palpitations.  Gastrointestinal: Negative for abdominal pain and vomiting.  Genitourinary: Negative for dysuria and hematuria.  Musculoskeletal: Negative for arthralgias and back pain.  Skin: Negative for color change and rash.  Neurological: Negative for seizures and syncope.  Psychiatric/Behavioral: Negative for suicidal ideas.  All other systems reviewed and are negative.   Physical Exam Updated Vital Signs BP 127/63 (BP Location: Right Arm)   Pulse (!) 108   Temp 98.5 F (36.9 C)   Resp 20   Ht 5' (1.524 m)   Wt (!) 140.6 kg   LMP 06/30/2020 (Exact Date)   SpO2 95%   BMI 60.54 kg/m   Physical Exam Vitals and nursing note reviewed.  Constitutional:      General: She is not in acute distress.    Appearance: She is well-developed.  HENT:     Head: Normocephalic and atraumatic.  Eyes:     Conjunctiva/sclera: Conjunctivae normal.  Cardiovascular:     Rate and Rhythm: Regular rhythm. Tachycardia present.     Pulses: Normal pulses.     Heart sounds: No murmur heard.   Pulmonary:     Effort: Pulmonary effort is normal. No respiratory distress.     Breath sounds: Normal breath sounds.  Abdominal:     Palpations: Abdomen is soft.     Tenderness: There is no abdominal tenderness.  Musculoskeletal:     Cervical back: Neck supple.  Skin:    General: Skin is warm and dry.  Neurological:     Mental Status: She is alert.  Psychiatric:      Comments: Appears anxious, no SI or HI     ED Results / Procedures / Treatments   Labs (all labs ordered are listed, but only abnormal results are displayed) Labs Reviewed  CBC - Abnormal; Notable for the following components:      Result Value   Hemoglobin 8.3 (*)    HCT 27.5 (*)    MCV 70.5 (*)    MCH 21.3 (*)    RDW 17.6 (*)    All other  components within normal limits  BASIC METABOLIC PANEL - Abnormal; Notable for the following components:   Sodium 133 (*)    CO2 18 (*)    All other components within normal limits  TROPONIN I (HIGH SENSITIVITY)    EKG EKG Interpretation  Date/Time:  Thursday July 27 2020 23:17:51 EDT Ventricular Rate:  94 PR Interval:  127 QRS Duration: 91 QT Interval:  372 QTC Calculation: 466 R Axis:   1 Text Interpretation: Sinus rhythm Low voltage, precordial leads Baseline wander in lead(s) V3 Confirmed by Marianna Fuss ([id number]) on 07/27/2020 11:30:27 PM Also confirmed by Marianna Fuss ([id number]), editor Elita Quick ([id number])  on 07/28/2020 1:32:23 PM   Radiology No results found.  Procedures Procedures   Medications Ordered in ED Medications  LORazepam (ATIVAN) injection 1 mg (1 mg Intramuscular Given 07/27/20 2253)  prochlorperazine (COMPAZINE) tablet 10 mg (10 mg Oral Given 07/27/20 2253)    ED Course  I have reviewed the triage vital signs and the nursing notes.  Pertinent labs & imaging results that were available during my care of the patient were reviewed by me and considered in my medical decision making (see chart for details).    MDM Rules/Calculators/A&P                          45 year old lady presents to ER with concern for anxiety attack.  On exam she looked well, vitals were stable except for mild tachycardia.  Provided symptomatic control with anxiolytic.  Basic labs were stable.  EKG without acute ischemic change.  Symptoms resolved after receiving Ativan.  No SI or HI.  Will discharge home.   After the  discussed management above, the patient was determined to be safe for discharge.  The patient was in agreement with this plan and all questions regarding their care were answered.  ED return precautions were discussed and the patient will return to the ED with any significant worsening of condition.   Final Clinical Impression(s) / ED Diagnoses Final diagnoses:  Anxiety    Rx / DC Orders ED Discharge Orders    None       Milagros Loll, MD 07/28/20 2310

## 2020-08-01 ENCOUNTER — Telehealth: Payer: Self-pay | Admitting: Physician Assistant

## 2020-08-01 NOTE — Telephone Encounter (Signed)
Okay to change her pharmacy?

## 2020-08-01 NOTE — Telephone Encounter (Signed)
Pt called requesting Remeron Rx to [facility]. Was sent to [facility].

## 2020-08-01 NOTE — Telephone Encounter (Signed)
yes

## 2020-08-02 ENCOUNTER — Other Ambulatory Visit: Payer: Self-pay

## 2020-08-02 MED ORDER — MIRTAZAPINE 30 MG PO TABS: 30.0000 mg | ORAL_TABLET | Freq: Every day | ORAL | 1 refills | Status: DC

## 2020-08-02 NOTE — Telephone Encounter (Signed)
Rx sent and pharmacy updated  

## 2020-08-14 ENCOUNTER — Ambulatory Visit: Payer: Self-pay | Admitting: Physician Assistant

## 2020-08-14 ENCOUNTER — Encounter (HOSPITAL_BASED_OUTPATIENT_CLINIC_OR_DEPARTMENT_OTHER): Payer: Self-pay | Admitting: *Deleted

## 2020-08-14 ENCOUNTER — Other Ambulatory Visit: Payer: Self-pay

## 2020-08-14 ENCOUNTER — Emergency Department (HOSPITAL_BASED_OUTPATIENT_CLINIC_OR_DEPARTMENT_OTHER)
Admission: EM | Admit: 2020-08-14 | Discharge: 2020-08-14 | Disposition: A | Discharge status: Home / Self Care | Payer: Self-pay | Attending: Emergency Medicine | Admitting: Emergency Medicine

## 2020-08-14 DIAGNOSIS — R000 Tachycardia, unspecified: Secondary | ICD-10-CM | POA: Insufficient documentation

## 2020-08-14 DIAGNOSIS — I10 Essential (primary) hypertension: Secondary | ICD-10-CM | POA: Insufficient documentation

## 2020-08-14 DIAGNOSIS — K029 Dental caries, unspecified: Secondary | ICD-10-CM | POA: Insufficient documentation

## 2020-08-14 DIAGNOSIS — Z87891 Personal history of nicotine dependence: Secondary | ICD-10-CM | POA: Insufficient documentation

## 2020-08-14 DIAGNOSIS — Z79899 Other long term (current) drug therapy: Secondary | ICD-10-CM | POA: Insufficient documentation

## 2020-08-14 MED ORDER — AMOXICILLIN 500 MG PO CAPS
500.0000 mg | ORAL_CAPSULE | Freq: Once | ORAL | Status: AC
  Administered 2020-08-14: 500 mg via ORAL
  Filled 2020-08-14: qty 1

## 2020-08-14 MED ORDER — BUPIVACAINE-EPINEPHRINE (PF) 0.5% -1:200000 IJ SOLN
1.8000 mL | Freq: Once | INTRAMUSCULAR | Status: AC
  Administered 2020-08-14: 1.8 mL
  Filled 2020-08-14: qty 1.8

## 2020-08-14 MED ORDER — AMOXICILLIN 500 MG PO CAPS: 500.0000 mg | ORAL_CAPSULE | Freq: Three times a day (TID) | ORAL | 0 refills | Status: DC

## 2020-08-14 MED ORDER — HYDROCODONE-ACETAMINOPHEN 5-325 MG PO TABS
1.0000 | ORAL_TABLET | Freq: Once | ORAL | Status: AC
  Administered 2020-08-14: 1 via ORAL
  Filled 2020-08-14: qty 1

## 2020-08-14 NOTE — ED Provider Notes (Signed)
[facility] EMERGENCY DEPARTMENT Provider Note   CSN: [id number] Arrival date & time: 08/14/20  2114     History Chief Complaint  Patient presents with  . Dental Pain    Marilyn Fowler is a 45 y.o. female.  The history is provided by the patient.  Dental Pain Location:  Lower Lower teeth location:  20/LL 2nd bicuspid and 21/LL 1st bicuspid Quality:  Aching, localized, pulsating, sharp and shooting Severity:  Severe Onset quality:  Gradual Duration:  2 weeks Timing:  Intermittent Progression:  Worsening Chronicity:  New Context: dental caries   Relieved by:  Nothing Worsened by:  Cold food/drink, hot food/drink, touching and jaw movement Ineffective treatments:  None tried Associated symptoms: gum swelling   Associated symptoms: no difficulty swallowing, no drooling, no facial pain, no facial swelling, no fever, no neck swelling, no oral lesions and no trismus   Risk factors comment:  Hx of alcohol abuse, anemia, depression, hypertension      Past Medical History:  Diagnosis Date  . Alcohol abuse   . Anemia   . Anemia   . Anxiety   . Blood transfusion without reported diagnosis   . Depression   . Hemorrhoid   . Hypertension   . Migraine     Patient Active Problem List   Diagnosis Date Noted  . History of suicide attempt 05/29/2020  . Benzodiazepine abuse (HCC) 05/29/2020  . Insomnia 05/29/2020  . Sprained ankle 01/14/2020  . Bipolar depression (HCC) 01/13/2020  . Physical exam 08/07/2017  . Headache 06/30/2017  . Chronic right shoulder pain 09/04/2016  . Hypokalemia 04/30/2016  . Hypomagnesemia 04/30/2016  . Alcoholic hepatitis 04/30/2016  . Alcohol induced fatty liver 04/30/2016  . Alcohol abuse 04/29/2016  . Left medial knee pain 06/29/2015  . Acute bacterial sinusitis 10/11/2014  . Maxillary sinusitis, acute 09/14/2014  . Gastroenteritis 11/09/2013  . Pleuritic chest pain 09/17/2013  . Chest pain 11/18/2012  . Morbid obesity (HCC)  09/29/2012  . HTN (hypertension) 07/12/2012  . Migraine 07/12/2012  . Bipolar II disorder, most recent episode major depressive (HCC) 07/11/2012    Past Surgical History:  Procedure Laterality Date  . HEMORRHOID SURGERY    . HERNIA REPAIR    . TONSILLECTOMY    . VENTRAL HERNIA REPAIR       OB History   No obstetric history on file.     Family History  Problem Relation Age of Onset  . Osteoarthritis Mother   . Diabetes Mother   . Hypertension Mother   . Depression Mother   . Hypertension Father   . Diabetes Father   . Asthma Son   . Deep vein thrombosis Maternal Grandmother   . Deep vein thrombosis Maternal Grandfather   . Stroke Neg Hx     Social History   Tobacco Use  . Smoking status: Former Smoker    Years: 10.00    Quit date: 04/12/2020    Years since quitting: 0.3  . Smokeless tobacco: Never Used  Vaping Use  . Vaping Use: Never used  Substance Use Topics  . Alcohol use: Not Currently  . Drug use: No    Home Medications Prior to Admission medications   Medication Sig Start Date End Date Taking? Authorizing Provider  amLODipine (NORVASC) 2.5 MG tablet Take 1 tablet (2.5 mg total) by mouth daily. 01/19/20   Clapacs, Jackquline Denmark, MD  Ascorbic Acid (VITAMIN C PO) Take 1 tablet by mouth daily.    [provider]  Cholecalciferol (  VITAMIN D3 PO) Take 1 tablet by mouth daily.    [provider]  ferrous sulfate 325 (65 FE) MG tablet Take 1 tablet (325 mg total) by mouth daily. 07/13/20   Terrilee Files, MD  gabapentin (NEURONTIN) 400 MG capsule Take 1 capsule (400 mg total) by mouth 3 (three) times daily. 07/05/20   Melony Overly T, PA-C  ibuprofen (ADVIL) 200 MG tablet Take 400 mg by mouth every 4 (four) hours as needed for headache (pain).    [provider]  metoprolol succinate (TOPROL-XL) 25 MG 24 hr tablet Take 1 tablet (25 mg total) by mouth daily. 01/19/20   Clapacs, Jackquline Denmark, MD  mirtazapine (REMERON) 30 MG tablet Take 1 tablet (30 mg  total) by mouth at bedtime. 08/02/20   Melony Overly T, PA-C  naproxen (NAPROSYN) 375 MG tablet Take 1 tablet (375 mg total) by mouth 2 (two) times daily as needed for moderate pain. Patient not taking: No sig reported 01/18/20   Clapacs, Jackquline Denmark, MD  phenazopyridine (PYRIDIUM) 200 MG tablet Take 1 tablet (200 mg total) by mouth 3 (three) times daily as needed (urinary discomfort). Patient not taking: No sig reported 01/18/20   Clapacs, Jackquline Denmark, MD  QUEtiapine (SEROQUEL XR) 300 MG 24 hr tablet Take 2 tablets (600 mg total) by mouth at bedtime. 07/05/20   Cherie Ouch, PA-C  buPROPion (WELLBUTRIN XL) 300 MG 24 hr tablet Take 1 tablet (300 mg total) by mouth daily. 04/03/20 04/03/20  Geoffery Lyons, MD    Allergies    Toradol [ketorolac tromethamine]  Review of Systems   Review of Systems  Constitutional: Negative for fever.  HENT: Negative for drooling, facial swelling and mouth sores.   All other systems reviewed and are negative.   Physical Exam Updated Vital Signs BP (!) 160/106 (BP Location: Right Arm)   Pulse (!) 116   Temp 98.9 F (37.2 C) (Oral)   Resp 20   Ht 5' (1.524 m)   Wt (!) 142.9 kg   LMP 08/07/2020   SpO2 99%   BMI 61.52 kg/m   Physical Exam Vitals and nursing note reviewed.  Constitutional:      General: She is not in acute distress.    Appearance: Normal appearance.  HENT:     Head: Normocephalic.     Nose: Nose normal.     Mouth/Throat:     Mouth: Mucous membranes are moist.   Eyes:     Pupils: Pupils are equal, round, and reactive to light.  Cardiovascular:     Rate and Rhythm: Tachycardia present.  Pulmonary:     Effort: Pulmonary effort is normal.  Musculoskeletal:     Cervical back: Normal range of motion and neck supple.  Skin:    General: Skin is warm.  Neurological:     Mental Status: She is alert. Mental status is at baseline.  Psychiatric:        Mood and Affect: Mood normal.        Behavior: Behavior normal.     ED Results / Procedures  / Treatments   Labs (all labs ordered are listed, but only abnormal results are displayed) Labs Reviewed - No data to display  EKG None  Radiology No results found.  Procedures Dental Block  Date/Time: 08/14/2020 10:41 PM Performed by: Gwyneth Sprout, MD Authorized by: Gwyneth Sprout, MD   Consent:    Consent obtained:  Verbal   Consent given by:  Patient   Risks, benefits, and  alternatives were discussed: yes     Risks discussed:  Unsuccessful block and pain   Alternatives discussed:  No treatment Universal protocol:    Procedure explained and questions answered to patient or proxy's satisfaction: yes     Patient identity confirmed:  Verbally with patient Indications:    Indications: dental pain   Location:    Anesthesia block type: periapical  Procedure details:    Syringe type:  Aspirating dental syringe   Needle gauge:  27 G   Anesthetic injected:  Bupivacaine 0.5% WITH epi   Injection procedure:  Anatomic landmarks identified, introduced needle, incremental injection and negative aspiration for blood Post-procedure details:    Outcome:  Anesthesia achieved   Procedure completion:  Tolerated     Medications Ordered in ED Medications  amoxicillin (AMOXIL) capsule 500 mg (500 mg Oral Given 08/14/20 2227)  HYDROcodone-acetaminophen (NORCO/VICODIN) 5-325 MG per tablet 1 tablet (1 tablet Oral Given 08/14/20 2227)  bupivacaine-epinephrine (MARCAINE W/ EPI) 0.5% -1:200000 injection 1.8 mL (1.8 mLs Infiltration Given by Other 08/14/20 2228)    ED Course  I have reviewed the triage vital signs and the nursing notes.  Pertinent labs & imaging results that were available during my care of the patient were reviewed by me and considered in my medical decision making (see chart for details).    MDM Rules/Calculators/A&P                          Pt with dental caries and facial swelling.  No signs of ludwig's angina or difficulty swallowing and no systemic  symptoms. Will treat with amoxicillin and have pt f/u with dentist.  Final Clinical Impression(s) / ED Diagnoses Final diagnoses:  Dental caries    Rx / DC Orders ED Discharge Orders         Ordered    amoxicillin (AMOXIL) 500 MG capsule  3 times daily        08/14/20 2242           Gwyneth Sprout, MD 08/14/20 2243

## 2020-08-14 NOTE — ED Triage Notes (Signed)
Dental pain. 

## 2020-08-27 ENCOUNTER — Encounter (HOSPITAL_BASED_OUTPATIENT_CLINIC_OR_DEPARTMENT_OTHER): Payer: Self-pay

## 2020-08-27 ENCOUNTER — Emergency Department (HOSPITAL_BASED_OUTPATIENT_CLINIC_OR_DEPARTMENT_OTHER)
Admission: EM | Admit: 2020-08-27 | Discharge: 2020-08-27 | Discharge status: Against Medical Advice | Payer: 59 | Attending: Emergency Medicine | Admitting: Emergency Medicine

## 2020-08-27 DIAGNOSIS — Z87891 Personal history of nicotine dependence: Secondary | ICD-10-CM | POA: Diagnosis not present

## 2020-08-27 DIAGNOSIS — R519 Headache, unspecified: Secondary | ICD-10-CM | POA: Diagnosis not present

## 2020-08-27 DIAGNOSIS — F410 Panic disorder [episodic paroxysmal anxiety] without agoraphobia: Secondary | ICD-10-CM | POA: Insufficient documentation

## 2020-08-27 DIAGNOSIS — I10 Essential (primary) hypertension: Secondary | ICD-10-CM | POA: Diagnosis not present

## 2020-08-27 DIAGNOSIS — R0682 Tachypnea, not elsewhere classified: Secondary | ICD-10-CM | POA: Diagnosis not present

## 2020-08-27 DIAGNOSIS — Z79899 Other long term (current) drug therapy: Secondary | ICD-10-CM | POA: Diagnosis not present

## 2020-08-27 DIAGNOSIS — R000 Tachycardia, unspecified: Secondary | ICD-10-CM | POA: Diagnosis not present

## 2020-08-27 MED ORDER — DIPHENHYDRAMINE HCL 50 MG/ML IJ SOLN
25.0000 mg | Freq: Once | INTRAMUSCULAR | Status: AC
  Administered 2020-08-27: 25 mg via INTRAMUSCULAR
  Filled 2020-08-27: qty 1

## 2020-08-27 MED ORDER — LORAZEPAM 2 MG/ML IJ SOLN
2.0000 mg | Freq: Once | INTRAMUSCULAR | Status: AC
  Administered 2020-08-27: 2 mg via INTRAMUSCULAR
  Filled 2020-08-27: qty 1

## 2020-08-27 NOTE — ED Notes (Signed)
Pt sts she needs to leave now before the medication makes her too sleepy; sts her ride will not be able to get her in the house if she is too sleepy. EDP not at desk to ask about dc papers; pt left.

## 2020-08-27 NOTE — ED Triage Notes (Signed)
Pt c/o panic attack since this morning. Unable to relax. Did not take PRN. Tachypneic and tachycardic. C/o headache

## 2020-08-27 NOTE — ED Provider Notes (Signed)
[facility] EMERGENCY DEPARTMENT Provider Note   CSN: [id number] Arrival date & time: 08/27/20  0957     History Chief Complaint  Patient presents with  . Panic Attack    Marilyn Fowler is a 45 y.o. female.  Patient is a 45 year old female who presents with an anxiety attack.  She says she has a history of anxiety and panic attacks and she woke up this morning with a panic attack.  She says that this is typical for her.  She denies any chest pain.  No shortness of breath.  No suicidal ideations.  She does have a headache.  She says this is a typical panic attack for her.  She says that she uses Seroquel at home and usually when she is not able to break it at home, she has to come into the ED and get injections.  She says that she normally gets injections of Ativan and Compazine for her associated nausea.  She does have some nausea but no vomiting.        Past Medical History:  Diagnosis Date  . Alcohol abuse   . Anemia   . Anemia   . Anxiety   . Blood transfusion without reported diagnosis   . Depression   . Hemorrhoid   . Hypertension   . Migraine     Patient Active Problem List   Diagnosis Date Noted  . History of suicide attempt 05/29/2020  . Benzodiazepine abuse (HCC) 05/29/2020  . Insomnia 05/29/2020  . Sprained ankle 01/14/2020  . Bipolar depression (HCC) 01/13/2020  . Physical exam 08/07/2017  . Headache 06/30/2017  . Chronic right shoulder pain 09/04/2016  . Hypokalemia 04/30/2016  . Hypomagnesemia 04/30/2016  . Alcoholic hepatitis 04/30/2016  . Alcohol induced fatty liver 04/30/2016  . Alcohol abuse 04/29/2016  . Left medial knee pain 06/29/2015  . Acute bacterial sinusitis 10/11/2014  . Maxillary sinusitis, acute 09/14/2014  . Gastroenteritis 11/09/2013  . Pleuritic chest pain 09/17/2013  . Chest pain 11/18/2012  . Morbid obesity (HCC) 09/29/2012  . HTN (hypertension) 07/12/2012  . Migraine 07/12/2012  . Bipolar II disorder, most recent  episode major depressive (HCC) 07/11/2012    Past Surgical History:  Procedure Laterality Date  . HEMORRHOID SURGERY    . HERNIA REPAIR    . TONSILLECTOMY    . VENTRAL HERNIA REPAIR       OB History   No obstetric history on file.     Family History  Problem Relation Age of Onset  . Osteoarthritis Mother   . Diabetes Mother   . Hypertension Mother   . Depression Mother   . Hypertension Father   . Diabetes Father   . Asthma Son   . Deep vein thrombosis Maternal Grandmother   . Deep vein thrombosis Maternal Grandfather   . Stroke Neg Hx     Social History   Tobacco Use  . Smoking status: Former Smoker    Years: 10.00    Quit date: 04/12/2020    Years since quitting: 0.3  . Smokeless tobacco: Never Used  Vaping Use  . Vaping Use: Never used  Substance Use Topics  . Alcohol use: Not Currently  . Drug use: No    Home Medications Prior to Admission medications   Medication Sig Start Date End Date Taking? Authorizing Provider  amLODipine (NORVASC) 2.5 MG tablet Take 1 tablet (2.5 mg total) by mouth daily. 01/19/20   Clapacs, Jackquline Denmark, MD  amoxicillin (AMOXIL) 500 MG capsule Take 1  capsule (500 mg total) by mouth 3 (three) times daily. 08/14/20   Gwyneth Sprout, MD  Ascorbic Acid (VITAMIN C PO) Take 1 tablet by mouth daily.    [provider]  Cholecalciferol (VITAMIN D3 PO) Take 1 tablet by mouth daily.    [provider]  ferrous sulfate 325 (65 FE) MG tablet Take 1 tablet (325 mg total) by mouth daily. 07/13/20   Terrilee Files, MD  gabapentin (NEURONTIN) 400 MG capsule Take 1 capsule (400 mg total) by mouth 3 (three) times daily. 07/05/20   Melony Overly T, PA-C  ibuprofen (ADVIL) 200 MG tablet Take 400 mg by mouth every 4 (four) hours as needed for headache (pain).    [provider]  metoprolol succinate (TOPROL-XL) 25 MG 24 hr tablet Take 1 tablet (25 mg total) by mouth daily. 01/19/20   Clapacs, Jackquline Denmark, MD  mirtazapine (REMERON) 30 MG  tablet Take 1 tablet (30 mg total) by mouth at bedtime. 08/02/20   Melony Overly T, PA-C  naproxen (NAPROSYN) 375 MG tablet Take 1 tablet (375 mg total) by mouth 2 (two) times daily as needed for moderate pain. Patient not taking: No sig reported 01/18/20   Clapacs, Jackquline Denmark, MD  phenazopyridine (PYRIDIUM) 200 MG tablet Take 1 tablet (200 mg total) by mouth 3 (three) times daily as needed (urinary discomfort). Patient not taking: No sig reported 01/18/20   Clapacs, Jackquline Denmark, MD  QUEtiapine (SEROQUEL XR) 300 MG 24 hr tablet Take 2 tablets (600 mg total) by mouth at bedtime. 07/05/20   Cherie Ouch, PA-C  buPROPion (WELLBUTRIN XL) 300 MG 24 hr tablet Take 1 tablet (300 mg total) by mouth daily. 04/03/20 04/03/20  Geoffery Lyons, MD    Allergies    Toradol [ketorolac tromethamine]  Review of Systems   Review of Systems  Constitutional: Negative for chills, diaphoresis, fatigue and fever.  HENT: Negative for congestion, rhinorrhea and sneezing.   Eyes: Negative.   Respiratory: Negative for cough, chest tightness and shortness of breath.   Cardiovascular: Negative for chest pain and leg swelling.  Gastrointestinal: Negative for abdominal pain, blood in stool, diarrhea, nausea and vomiting.  Genitourinary: Negative for difficulty urinating, flank pain, frequency and hematuria.  Musculoskeletal: Negative for arthralgias and back pain.  Skin: Negative for rash.  Neurological: Negative for dizziness, speech difficulty, weakness, numbness and headaches.  Psychiatric/Behavioral: Negative for suicidal ideas. The patient is nervous/anxious.     Physical Exam Updated Vital Signs BP (!) 148/87 (BP Location: Left Arm)   Pulse (!) 122   Temp 98 F (36.7 C)   Resp (!) 30   Ht 5' (1.524 m)   Wt (!) 138.3 kg   LMP 08/07/2020   SpO2 100%   BMI 59.57 kg/m   Physical Exam Constitutional:      Appearance: She is well-developed.  HENT:     Head: Normocephalic and atraumatic.  Eyes:     Pupils: Pupils are  equal, round, and reactive to light.  Cardiovascular:     Rate and Rhythm: Regular rhythm. Tachycardia present.     Heart sounds: Normal heart sounds.  Pulmonary:     Effort: Pulmonary effort is normal. Tachypnea present. No respiratory distress.     Breath sounds: Normal breath sounds. No wheezing or rales.  Chest:     Chest wall: No tenderness.  Abdominal:     General: Bowel sounds are normal.     Palpations: Abdomen is soft.     Tenderness: There  is no abdominal tenderness. There is no guarding or rebound.  Musculoskeletal:        General: Normal range of motion.     Cervical back: Normal range of motion and neck supple.  Lymphadenopathy:     Cervical: No cervical adenopathy.  Skin:    General: Skin is warm and dry.     Findings: No rash.  Neurological:     Mental Status: She is alert and oriented to person, place, and time.     ED Results / Procedures / Treatments   Labs (all labs ordered are listed, but only abnormal results are displayed) Labs Reviewed - No data to display  EKG None  Radiology No results found.  Procedures Procedures   Medications Ordered in ED Medications  LORazepam (ATIVAN) injection 2 mg (2 mg Intramuscular Given 08/27/20 1119)  diphenhydrAMINE (BENADRYL) injection 25 mg (25 mg Intramuscular Given 08/27/20 1120)    ED Course  I have reviewed the triage vital signs and the nursing notes.  Pertinent labs & imaging results that were available during my care of the patient were reviewed by me and considered in my medical decision making (see chart for details).    MDM Rules/Calculators/A&P                          Patient is a 45 year old female who presents with a panic attack.  She denies suicidal ideations.  She was given an injection of Ativan and Benadryl.  Patient eloped from the ED prior to completion of treatment without my knowledge. Final Clinical Impression(s) / ED Diagnoses Final diagnoses:  Panic attack    Rx / DC Orders ED  Discharge Orders    None       Rolan Bucco, MD 08/27/20 1202

## 2020-08-31 ENCOUNTER — Telehealth: Payer: Self-pay | Admitting: Physician Assistant

## 2020-08-31 NOTE — Telephone Encounter (Signed)
Pt would like a refill on seroquel, remeron, gabapentin. Please send to [facility] on [address].

## 2020-09-01 ENCOUNTER — Other Ambulatory Visit: Payer: Self-pay

## 2020-09-01 MED ORDER — MIRTAZAPINE 30 MG PO TABS: 30.0000 mg | ORAL_TABLET | Freq: Every day | ORAL | 0 refills | Status: DC

## 2020-09-01 MED ORDER — GABAPENTIN 400 MG PO CAPS: 400.0000 mg | ORAL_CAPSULE | Freq: Three times a day (TID) | ORAL | 0 refills | Status: DC

## 2020-09-01 MED ORDER — QUETIAPINE FUMARATE ER 300 MG PO TB24: 600.0000 mg | ORAL_TABLET | Freq: Every day | ORAL | 0 refills | Status: DC

## 2020-09-01 NOTE — Telephone Encounter (Signed)
Rx's sent for 30 day on each and pt has apt end of the month

## 2020-09-06 ENCOUNTER — Other Ambulatory Visit: Payer: Self-pay | Admitting: Physician Assistant

## 2020-09-07 NOTE — Telephone Encounter (Signed)
Previously sent on 5/06

## 2020-09-19 ENCOUNTER — Telehealth (INDEPENDENT_AMBULATORY_CARE_PROVIDER_SITE_OTHER): Payer: 59 | Admitting: Physician Assistant

## 2020-09-19 ENCOUNTER — Encounter: Payer: Self-pay | Admitting: Physician Assistant

## 2020-09-19 DIAGNOSIS — F3181 Bipolar II disorder: Secondary | ICD-10-CM

## 2020-09-19 DIAGNOSIS — F410 Panic disorder [episodic paroxysmal anxiety] without agoraphobia: Secondary | ICD-10-CM

## 2020-09-19 DIAGNOSIS — F411 Generalized anxiety disorder: Secondary | ICD-10-CM | POA: Diagnosis not present

## 2020-09-19 DIAGNOSIS — F1310 Sedative, hypnotic or anxiolytic abuse, uncomplicated: Secondary | ICD-10-CM

## 2020-09-19 DIAGNOSIS — G4700 Insomnia, unspecified: Secondary | ICD-10-CM | POA: Diagnosis not present

## 2020-09-19 DIAGNOSIS — T424X2A Poisoning by benzodiazepines, intentional self-harm, initial encounter: Secondary | ICD-10-CM

## 2020-09-19 MED ORDER — PROPRANOLOL HCL 20 MG PO TABS: 20.0000 mg | ORAL_TABLET | Freq: Three times a day (TID) | ORAL | 1 refills | Status: DC | PRN

## 2020-09-19 NOTE — Progress Notes (Signed)
[facility] Check  Patient ID: Marilyn Fowler,  MRN: [id number]  PCP: Patient, No Pcp Per (Inactive)  Date of Evaluation: 09/19/2020 Time spent:30 minutes  Chief Complaint:  Chief Complaint    Anxiety; Insomnia; Depression; Follow-up     Virtual Visit via Telehealth  I connected with patient by telephone, with their informed consent, and verified patient privacy and that I am speaking with the correct person using two identifiers.  I am private, in my office and the patient is at work.  I discussed the limitations, risks, security and privacy concerns of performing an evaluation and management service by telephone and the availability of in person appointments. I also discussed with the patient that there may be a patient responsible charge related to this service. The patient expressed understanding and agreed to proceed.   I discussed the assessment and treatment plan with the patient. The patient was provided an opportunity to ask questions and all were answered. The patient agreed with the plan and demonstrated an understanding of the instructions.   The patient was advised to call back or seek an in-person evaluation if the symptoms worsen or if the condition fails to improve as anticipated.  I provided 30 minutes of non-face-to-face time during this encounter.  HISTORY/CURRENT STATUS: HPI For routine 2 month med check.  Insomnia is better since increasing the Seroquel and the gabapentin at the last visit 2 months ago.  Gets about 6+ hours of sleep. Feels rested when she gets up.   Is enjoying her new job at [employer]  Has been there almost 2 months.  Complains of anxiety.  "The day to day anxiety might be a little bit better.  But the panic attacks are not.  I have had to go to the ER several times since we talked the last time and they had to give me Ativan.  That is the only thing that will break the attack."  The bad ones happen about twice a month.  The symptoms  with those are chest tightness, shortness of breath, sweating, feeling jittery and has a horrible feeling that something bad is going to happen.  They can last a couple of hours and no matter what she does, nothing helps.  She still has some panic attacks, maybe every few days that are not as bad as the others.  They can last a few minutes and she is able to calm herself down.  She started taking Benadryl a couple of weeks ago to see if that would help.  "Hydroxyzine did not work and it is nothing but Benadryl anyway.  I decided to see if Benadryl would help but it is not helping with the panic attacks.  It might be helping with the day to day anxiety."  On average she drinks 4 soft drinks daily.  Coke or diet Pepsi.  Patient denies loss of interest in usual activities and is able to enjoy things.  Denies decreased energy or motivation.  Appetite has not changed.  No extreme sadness, tearfulness, or feelings of hopelessness.  Denies any changes in concentration, making decisions or remembering things.  Denies suicidal or homicidal thoughts.  Patient denies increased energy with decreased need for sleep, no increased talkativeness, no racing thoughts, no impulsivity or risky behaviors, no increased spending, no increased libido, no grandiosity, no increased irritability or anger, no paranoia, and no hallucinations.  Denies dizziness, syncope, seizures, numbness, tingling, tremor, tics, unsteady gait, slurred speech, confusion. Denies muscle or joint pain, stiffness, or  dystonia.  Individual Medical History/ Review of Systems: Changes? :Yes  ER 4 times in 2 months for PA, atypical CP, and dental carries.  Past medications for mental health diagnoses include: Ambien, Klonopin (intentionally OD'ed 10/17/19, see ER notes) Wellbutrin, Prozac, Hydroxyzine is ineffective, Buspar didn't help.   Allergies: Toradol [ketorolac tromethamine]  Current Medications:  Current Outpatient Medications:  .  amLODipine  (NORVASC) 2.5 MG tablet, Take 1 tablet (2.5 mg total) by mouth daily., Disp: 30 tablet, Rfl: 1 .  Ascorbic Acid (VITAMIN C PO), Take 1 tablet by mouth daily., Disp: , Rfl:  .  diphenhydrAMINE (BENADRYL) 50 MG capsule, Take 50 mg by mouth every 8 (eight) hours as needed., Disp: , Rfl:  .  ferrous sulfate 325 (65 FE) MG tablet, Take 1 tablet (325 mg total) by mouth daily., Disp: 30 tablet, Rfl: 0 .  gabapentin (NEURONTIN) 400 MG capsule, Take 1 capsule (400 mg total) by mouth 3 (three) times daily., Disp: 90 capsule, Rfl: 0 .  ibuprofen (ADVIL) 200 MG tablet, Take 400 mg by mouth every 4 (four) hours as needed for headache (pain)., Disp: , Rfl:  .  metoprolol succinate (TOPROL-XL) 25 MG 24 hr tablet, Take 1 tablet (25 mg total) by mouth daily., Disp: 30 tablet, Rfl: 1 .  mirtazapine (REMERON) 30 MG tablet, Take 1 tablet (30 mg total) by mouth at bedtime., Disp: 30 tablet, Rfl: 0 .  Multiple Vitamin (MULTIVITAMIN) tablet, Take 1 tablet by mouth daily., Disp: , Rfl:  .  propranolol (INDERAL) 20 MG tablet, Take 1 tablet (20 mg total) by mouth 3 (three) times daily as needed., Disp: 60 tablet, Rfl: 1 .  QUEtiapine (SEROQUEL XR) 300 MG 24 hr tablet, Take 2 tablets (600 mg total) by mouth at bedtime., Disp: 60 tablet, Rfl: 0 .  zinc gluconate 50 MG tablet, Take 50 mg by mouth daily., Disp: , Rfl:  .  Cholecalciferol (VITAMIN D3 PO), Take 1 tablet by mouth daily. (Patient not taking: Reported on 09/19/2020), Disp: , Rfl:  .  naproxen (NAPROSYN) 375 MG tablet, Take 1 tablet (375 mg total) by mouth 2 (two) times daily as needed for moderate pain. (Patient not taking: No sig reported), Disp: 60 tablet, Rfl: 0 Medication Side Effects: none  Family Medical/ Social History: Changes?  New job, started around 2 months ago. [employer].  MENTAL HEALTH EXAM:  There were no vitals taken for this visit.There is no height or weight on file to calculate BMI.  General Appearance: Unable to assess  Eye Contact:  Unable  to assess  Speech:  Clear and Coherent and Normal Rate  Volume:  Normal  Mood:  Anxious and Irritable  Affect:  Unable to assess  Thought Process:  Goal Directed and Descriptions of Associations: Circumstantial  Orientation:  Full (Time, Place, and Person)  Thought Content: Logical   Suicidal Thoughts:  No  Homicidal Thoughts:  No  Memory:  WNL  Judgement:  Fair  Insight:  Fair  Psychomotor Activity:  Unable to assess  Concentration:  Concentration: Good  Recall:  Good  Fund of Knowledge: Good  Language: Good  Assets:  Desire for Improvement  ADL's:  Intact  Cognition: WNL  Prognosis:  Fair    DIAGNOSES:    ICD-10-CM   1. Generalized anxiety disorder  F41.1   2. Panic disorder  F41.0   3. Bipolar II disorder, most recent episode major depressive (HCC)  F31.81   4. Insomnia, unspecified type  G47.00   5. Benzodiazepine  abuse (HCC)  F13.10   6. Suicide attempt by benzodiazepine overdose (HCC)  T42.4X2A     Receiving Psychotherapy: No    RECOMMENDATIONS:  PDMP reviewed. I provided 30 minutes of the non face to face time during this encounter, including time spent before and after the visit in records review, medical decision making, and charting. She does not specifically ask for a benzodiazepine today like she has in most other visits in the past.  We did not discuss it.  I will not prescribe benzodiazepines because she overdosed on Klonopin October 18, 2019, and also misused by getting a refill before I could contact the pharmacy and cancel it, knowing I was discontinuing that medication.  We discussed her caffeine intake.  Explained to patient that caffeine is a stimulant and at high doses, which she seems to be getting daily, that can cause and/or worsen anxiety.  Until she cuts back or stops drinking as much caffeine, no medication is going to help her.  Advised not to quit caffeine cold [place] because it would cause severe headaches, but recommended she get caffeine free  Cokes and mix it with the regular Coke, maybe half-and-half for a week or 2 and gradually go down to completely caffeine free.   Discussed using propranolol to help with panic attacks.  She is never used that before.  She understands it is a blood pressure pill and therefore can decrease blood pressures, causing orthostatic hypotension, dizziness, or fainting.  Patient states she would like to try it.  She will check her blood pressures a few times a week and let me know if it is decreasing below 110/70 on a consistent basis.  Also if pulse and drops below 60 also let me know.  I am aware that she takes metoprolol so will be on 2 beta blockers.  The propranolol will be as needed anyway so I doubt it will drop her blood pressures.  She verbalizes understanding. Also discussed adding NAC.  This helps prevent and treat anxiety. Start propranolol 20 mg, 1 p.o. 3 times daily as needed panic. Start NAC 600 mg bid.  OTC. Continue gabapentin 400 mg, 1 p.o. 3 times daily. Continue mirtazapine 30 mg, 1 p.o. nightly.  (Using for depression and sleep.) Continue Seroquel XR 300 mg, 2 p.o. nightly. Needs to get back in counseling. Return in 2 months.  Melony Overly, PA-C

## 2020-09-24 ENCOUNTER — Other Ambulatory Visit: Payer: Self-pay

## 2020-09-24 ENCOUNTER — Emergency Department (HOSPITAL_BASED_OUTPATIENT_CLINIC_OR_DEPARTMENT_OTHER)
Admission: EM | Admit: 2020-09-24 | Discharge: 2020-09-24 | Disposition: A | Discharge status: Home / Self Care | Payer: 59 | Attending: Emergency Medicine | Admitting: Emergency Medicine

## 2020-09-24 DIAGNOSIS — I10 Essential (primary) hypertension: Secondary | ICD-10-CM | POA: Insufficient documentation

## 2020-09-24 DIAGNOSIS — Z79899 Other long term (current) drug therapy: Secondary | ICD-10-CM | POA: Diagnosis not present

## 2020-09-24 DIAGNOSIS — Z87891 Personal history of nicotine dependence: Secondary | ICD-10-CM | POA: Insufficient documentation

## 2020-09-24 DIAGNOSIS — F410 Panic disorder [episodic paroxysmal anxiety] without agoraphobia: Secondary | ICD-10-CM | POA: Insufficient documentation

## 2020-09-24 MED ORDER — LORAZEPAM 1 MG PO TABS
1.0000 mg | ORAL_TABLET | Freq: Once | ORAL | Status: AC
  Administered 2020-09-24: 1 mg via ORAL
  Filled 2020-09-24: qty 1

## 2020-09-24 MED ORDER — HYDROXYZINE HCL 25 MG PO TABS: 25.0000 mg | ORAL_TABLET | Freq: Four times a day (QID) | ORAL | 0 refills | Status: AC

## 2020-09-24 NOTE — ED Provider Notes (Signed)
[facility] EMERGENCY DEPARTMENT Provider Note   CSN: [id number] Arrival date & time: 09/24/20  1703     History Chief Complaint  Patient presents with  . Panic Attack    Marilyn Fowler is a 45 y.o. female.  Patient here with panic attack.  Supposed to start propranolol for the same but has not filled the prescription yet.  Follows with mental health provider.  Denies any suicidal homicidal ideation.  Took some Benadryl and feeling improved.  The history is provided by the patient.  Mental Health Problem Presenting symptoms: no agitation, no hallucinations, no self-mutilation and no suicidal thoughts   Degree of incapacity (severity):  Mild Onset quality:  Sudden Duration:  1 hour Timing:  Constant Progression:  Improving Chronicity:  Recurrent Associated symptoms: anxiety   Associated symptoms: no chest pain   Risk factors: hx of mental illness        Past Medical History:  Diagnosis Date  . Alcohol abuse   . Anemia   . Anemia   . Anxiety   . Blood transfusion without reported diagnosis   . Depression   . Hemorrhoid   . Hypertension   . Migraine     Patient Active Problem List   Diagnosis Date Noted  . History of suicide attempt 05/29/2020  . Benzodiazepine abuse (HCC) 05/29/2020  . Insomnia 05/29/2020  . Sprained ankle 01/14/2020  . Bipolar depression (HCC) 01/13/2020  . Physical exam 08/07/2017  . Headache 06/30/2017  . Chronic right shoulder pain 09/04/2016  . Hypokalemia 04/30/2016  . Hypomagnesemia 04/30/2016  . Alcoholic hepatitis 04/30/2016  . Alcohol induced fatty liver 04/30/2016  . Alcohol abuse 04/29/2016  . Left medial knee pain 06/29/2015  . Acute bacterial sinusitis 10/11/2014  . Maxillary sinusitis, acute 09/14/2014  . Gastroenteritis 11/09/2013  . Pleuritic chest pain 09/17/2013  . Chest pain 11/18/2012  . Morbid obesity (HCC) 09/29/2012  . HTN (hypertension) 07/12/2012  . Migraine 07/12/2012  . Bipolar II disorder,  most recent episode major depressive (HCC) 07/11/2012    Past Surgical History:  Procedure Laterality Date  . HEMORRHOID SURGERY    . HERNIA REPAIR    . TONSILLECTOMY    . VENTRAL HERNIA REPAIR       OB History   No obstetric history on file.     Family History  Problem Relation Age of Onset  . Osteoarthritis Mother   . Diabetes Mother   . Hypertension Mother   . Depression Mother   . Hypertension Father   . Diabetes Father   . Asthma Son   . Deep vein thrombosis Maternal Grandmother   . Deep vein thrombosis Maternal Grandfather   . Stroke Neg Hx     Social History   Tobacco Use  . Smoking status: Former Smoker    Years: 10.00    Quit date: 04/12/2020    Years since quitting: 0.4  . Smokeless tobacco: Never Used  Vaping Use  . Vaping Use: Never used  Substance Use Topics  . Alcohol use: Not Currently  . Drug use: No    Home Medications Prior to Admission medications   Medication Sig Start Date End Date Taking? Authorizing Provider  hydrOXYzine (ATARAX/VISTARIL) 25 MG tablet Take 1 tablet (25 mg total) by mouth every 6 (six) hours for 20 doses. 09/24/20 09/29/20 Yes Nyeshia Mysliwiec, DO  amLODipine (NORVASC) 2.5 MG tablet Take 1 tablet (2.5 mg total) by mouth daily. 01/19/20   Clapacs, Jackquline Denmark, MD  Ascorbic Acid (VITAMIN C  PO) Take 1 tablet by mouth daily.    [provider]  Cholecalciferol (VITAMIN D3 PO) Take 1 tablet by mouth daily. Patient not taking: Reported on 09/19/2020    [provider]  diphenhydrAMINE (BENADRYL) 50 MG capsule Take 50 mg by mouth every 8 (eight) hours as needed.    [provider]  ferrous sulfate 325 (65 FE) MG tablet Take 1 tablet (325 mg total) by mouth daily. 07/13/20   Terrilee Files, MD  gabapentin (NEURONTIN) 400 MG capsule Take 1 capsule (400 mg total) by mouth 3 (three) times daily. 09/01/20   Melony Overly T, PA-C  ibuprofen (ADVIL) 200 MG tablet Take 400 mg by mouth every 4 (four) hours as needed for  headache (pain).    [provider]  metoprolol succinate (TOPROL-XL) 25 MG 24 hr tablet Take 1 tablet (25 mg total) by mouth daily. 01/19/20   Clapacs, Jackquline Denmark, MD  mirtazapine (REMERON) 30 MG tablet Take 1 tablet (30 mg total) by mouth at bedtime. 09/01/20   Melony Overly T, PA-C  Multiple Vitamin (MULTIVITAMIN) tablet Take 1 tablet by mouth daily.    [provider]  naproxen (NAPROSYN) 375 MG tablet Take 1 tablet (375 mg total) by mouth 2 (two) times daily as needed for moderate pain. Patient not taking: No sig reported 01/18/20   Clapacs, Jackquline Denmark, MD  propranolol (INDERAL) 20 MG tablet Take 1 tablet (20 mg total) by mouth 3 (three) times daily as needed. 09/19/20   Melony Overly T, PA-C  QUEtiapine (SEROQUEL XR) 300 MG 24 hr tablet Take 2 tablets (600 mg total) by mouth at bedtime. 09/01/20   Melony Overly T, PA-C  zinc gluconate 50 MG tablet Take 50 mg by mouth daily.    [provider]  buPROPion (WELLBUTRIN XL) 300 MG 24 hr tablet Take 1 tablet (300 mg total) by mouth daily. 04/03/20 04/03/20  Geoffery Lyons, MD    Allergies    Robaxin [methocarbamol] and Toradol [ketorolac tromethamine]  Review of Systems   Review of Systems  Constitutional: Negative for fever.  Cardiovascular: Negative for chest pain.  Neurological: Negative for seizures, weakness and numbness.  Psychiatric/Behavioral: Negative for agitation, behavioral problems, confusion, decreased concentration, dysphoric mood, hallucinations, self-injury, sleep disturbance and suicidal ideas. The patient is nervous/anxious. The patient is not hyperactive.     Physical Exam Updated Vital Signs  ED Triage Vitals  Enc Vitals Group     BP 09/24/20 1715 125/85     Pulse Rate 09/24/20 1715 80     Resp 09/24/20 1715 (!) 28     Temp 09/24/20 1715 98.3 F (36.8 C)     Temp Source 09/24/20 1715 Oral     SpO2 09/24/20 1715 100 %     Weight 09/24/20 1717 (!) 312 lb (141.5 kg)     Height 09/24/20 1717 5' (1.524 m)      Head Circumference --      Peak Flow --      Pain Score 09/24/20 1717 0     Pain Loc --      Pain Edu? --      Excl. in GC? --     Physical Exam Constitutional:      General: She is not in acute distress.    Appearance: She is not ill-appearing.  Cardiovascular:     Pulses: Normal pulses.  Skin:    Capillary Refill: Capillary refill takes less than 2 seconds.  Neurological:  General: No focal deficit present.     Mental Status: She is alert.  Psychiatric:     Comments: Patient anxious but denies any suicidal homicidal ideation overall she appears well     ED Results / Procedures / Treatments   Labs (all labs ordered are listed, but only abnormal results are displayed) Labs Reviewed - No data to display  EKG None  Radiology No results found.  Procedures Procedures   Medications Ordered in ED Medications  LORazepam (ATIVAN) tablet 1 mg (1 mg Oral Given 09/24/20 1744)    ED Course  I have reviewed the triage vital signs and the nursing notes.  Pertinent labs & imaging results that were available during my care of the patient were reviewed by me and considered in my medical decision making (see chart for details).    MDM Rules/Calculators/A&P                          Marilyn Fowler is a 45 year old female with history of panic attacks who presents to the ED with anxiety.  Normal vitals.  No fever.  History of panic attacks.  Was just prescribed propranolol to help with her chronic panic attacks.  Has not been able to fill that prescription yet.  She is here with family.  She took Benadryl and is starting to feel better.  Gave her dose of Ativan with improvement.  She denied any suicidal homicidal ideation.  We will add Vistaril to her medications to help with anxiety.  She states that that has helped in the past.  She has no chest pain or shortness of breath and no concern for organic process.  She understands return precautions and was discharged in ED in good  condition.  This chart was dictated using voice recognition software.  Despite best efforts to proofread,  errors can occur which can change the documentation meaning.    Final Clinical Impression(s) / ED Diagnoses Final diagnoses:  Panic attack    Rx / DC Orders ED Discharge Orders         Ordered    hydrOXYzine (ATARAX/VISTARIL) 25 MG tablet  Every 6 hours        09/24/20 1756           Virgina Norfolk, DO 09/24/20 1759

## 2020-09-24 NOTE — ED Triage Notes (Signed)
Panic attack started about an hour ago without relief despite taking Benadryl and regular meds--seroquel. Anxious & tachypneic   Gait steady

## 2020-09-24 NOTE — ED Notes (Signed)
States was lying down and began having a panic attack. States this is typical for her, states has been started on propanolol but has not begun here Rx as of today. Is unsure of the trigger at this time. Denies any SI or HI at this time.

## 2020-09-27 ENCOUNTER — Emergency Department (HOSPITAL_BASED_OUTPATIENT_CLINIC_OR_DEPARTMENT_OTHER)
Admission: EM | Admit: 2020-09-27 | Discharge: 2020-09-27 | Disposition: A | Discharge status: Home / Self Care | Payer: 59 | Attending: Emergency Medicine | Admitting: Emergency Medicine

## 2020-09-27 ENCOUNTER — Encounter (HOSPITAL_BASED_OUTPATIENT_CLINIC_OR_DEPARTMENT_OTHER): Payer: Self-pay | Admitting: *Deleted

## 2020-09-27 ENCOUNTER — Other Ambulatory Visit: Payer: Self-pay

## 2020-09-27 DIAGNOSIS — J029 Acute pharyngitis, unspecified: Secondary | ICD-10-CM | POA: Diagnosis not present

## 2020-09-27 DIAGNOSIS — I10 Essential (primary) hypertension: Secondary | ICD-10-CM | POA: Insufficient documentation

## 2020-09-27 DIAGNOSIS — Z87891 Personal history of nicotine dependence: Secondary | ICD-10-CM | POA: Insufficient documentation

## 2020-09-27 DIAGNOSIS — R509 Fever, unspecified: Secondary | ICD-10-CM | POA: Diagnosis not present

## 2020-09-27 DIAGNOSIS — F419 Anxiety disorder, unspecified: Secondary | ICD-10-CM | POA: Diagnosis present

## 2020-09-27 DIAGNOSIS — Z20822 Contact with and (suspected) exposure to covid-19: Secondary | ICD-10-CM | POA: Diagnosis not present

## 2020-09-27 DIAGNOSIS — R197 Diarrhea, unspecified: Secondary | ICD-10-CM | POA: Diagnosis not present

## 2020-09-27 DIAGNOSIS — R112 Nausea with vomiting, unspecified: Secondary | ICD-10-CM | POA: Insufficient documentation

## 2020-09-27 DIAGNOSIS — Z79899 Other long term (current) drug therapy: Secondary | ICD-10-CM | POA: Insufficient documentation

## 2020-09-27 MED ORDER — ONDANSETRON 4 MG PO TBDP: 4.0000 mg | ORAL_TABLET | Freq: Three times a day (TID) | ORAL | 0 refills | Status: DC | PRN

## 2020-09-27 MED ORDER — ONDANSETRON 4 MG PO TBDP
4.0000 mg | ORAL_TABLET | Freq: Once | ORAL | Status: AC
  Administered 2020-09-27: 4 mg via ORAL
  Filled 2020-09-27: qty 1

## 2020-09-27 MED ORDER — HYDROXYZINE HCL 25 MG PO TABS
25.0000 mg | ORAL_TABLET | Freq: Once | ORAL | Status: AC
  Administered 2020-09-27: 25 mg via ORAL
  Filled 2020-09-27 (×2): qty 1

## 2020-09-27 NOTE — ED Provider Notes (Signed)
[facility] EMERGENCY DEPARTMENT Provider Note   CSN: [id number] Arrival date & time: 09/27/20  1731     History Chief Complaint  Patient presents with  . Panic Attack    Marilyn Fowler is a 45 y.o. female area of anxiety, depression, hypertension, migraines.  Patient presents with a chief complaint of anxiety and flulike symptoms.  Patient reports that she has had sore throat, fevers, chills, nausea, vomiting, and diarrhea over the last 2 days.  Patient reports that she has been exposed to COVID-19 from coworkers at work.  Patient reports T-max of 101.2 orally last night.  Patient reports vomiting 4 times in the last 24 hours.  Patient describes emesis as bilious or stomach contents.  Patient denies any frank blood or coffee-ground emesis.  Patient denies trouble swallowing, drooling, nasal congestion, rhinorrhea, shortness of breath, chest pain, abdominal pain, blood in stool, melena, syncope, lightheadedness.  Patient reports that he has been feeling anxious today.  Denies any new stressors in her life.  Patient actually reports that things have been going quite well for her.  Patient is unsure what triggered her anxiety today.  Patient reports that she is currently taking Seroquel and is prescribed propanolol however has not started this medication due to cost.  Per chart review patient has been seen multiple times in the emergency department for complaints of anxiety.  Patient was last seen 5/29, was given Ativan and prescribed Atarax on this visit.  Per chart review patient's psychiatric provider Melony Overly PA-C; dose on Klonopin June 21-21 and also misused benzodiazepines in the past.  Patient denies any suicidal ideations, homicidal ideations, auditory hallucinations, or visualizations.    HPI     Past Medical History:  Diagnosis Date  . Alcohol abuse   . Anemia   . Anemia   . Anxiety   . Blood transfusion without reported diagnosis   . Depression   . Hemorrhoid    . Hypertension   . Migraine     Patient Active Problem List   Diagnosis Date Noted  . History of suicide attempt 05/29/2020  . Benzodiazepine abuse (HCC) 05/29/2020  . Insomnia 05/29/2020  . Sprained ankle 01/14/2020  . Bipolar depression (HCC) 01/13/2020  . Physical exam 08/07/2017  . Headache 06/30/2017  . Chronic right shoulder pain 09/04/2016  . Hypokalemia 04/30/2016  . Hypomagnesemia 04/30/2016  . Alcoholic hepatitis 04/30/2016  . Alcohol induced fatty liver 04/30/2016  . Alcohol abuse 04/29/2016  . Left medial knee pain 06/29/2015  . Acute bacterial sinusitis 10/11/2014  . Maxillary sinusitis, acute 09/14/2014  . Gastroenteritis 11/09/2013  . Pleuritic chest pain 09/17/2013  . Chest pain 11/18/2012  . Morbid obesity (HCC) 09/29/2012  . HTN (hypertension) 07/12/2012  . Migraine 07/12/2012  . Bipolar II disorder, most recent episode major depressive (HCC) 07/11/2012    Past Surgical History:  Procedure Laterality Date  . HEMORRHOID SURGERY    . HERNIA REPAIR    . TONSILLECTOMY    . VENTRAL HERNIA REPAIR       OB History   No obstetric history on file.     Family History  Problem Relation Age of Onset  . Osteoarthritis Mother   . Diabetes Mother   . Hypertension Mother   . Depression Mother   . Hypertension Father   . Diabetes Father   . Asthma Son   . Deep vein thrombosis Maternal Grandmother   . Deep vein thrombosis Maternal Grandfather   . Stroke Neg Hx  Social History   Tobacco Use  . Smoking status: Former Smoker    Years: 10.00    Quit date: 04/12/2020    Years since quitting: 0.4  . Smokeless tobacco: Never Used  Vaping Use  . Vaping Use: Never used  Substance Use Topics  . Alcohol use: Not Currently  . Drug use: No    Home Medications Prior to Admission medications   Medication Sig Start Date End Date Taking? Authorizing Provider  amLODipine (NORVASC) 2.5 MG tablet Take 1 tablet (2.5 mg total) by mouth daily. 01/19/20    Clapacs, Jackquline DenmarkJohn T, MD  Ascorbic Acid (VITAMIN C PO) Take 1 tablet by mouth daily.    [provider]  Cholecalciferol (VITAMIN D3 PO) Take 1 tablet by mouth daily. Patient not taking: Reported on 09/19/2020    [provider]  diphenhydrAMINE (BENADRYL) 50 MG capsule Take 50 mg by mouth every 8 (eight) hours as needed.    [provider]  ferrous sulfate 325 (65 FE) MG tablet Take 1 tablet (325 mg total) by mouth daily. 07/13/20   Terrilee FilesButler, Michael C, MD  gabapentin (NEURONTIN) 400 MG capsule Take 1 capsule (400 mg total) by mouth 3 (three) times daily. 09/01/20   Melony OverlyHurst, Teresa T, PA-C  hydrOXYzine (ATARAX/VISTARIL) 25 MG tablet Take 1 tablet (25 mg total) by mouth every 6 (six) hours for 20 doses. 09/24/20 09/29/20  Curatolo, Adam, DO  ibuprofen (ADVIL) 200 MG tablet Take 400 mg by mouth every 4 (four) hours as needed for headache (pain).    [provider]  metoprolol succinate (TOPROL-XL) 25 MG 24 hr tablet Take 1 tablet (25 mg total) by mouth daily. 01/19/20   Clapacs, Jackquline DenmarkJohn T, MD  mirtazapine (REMERON) 30 MG tablet Take 1 tablet (30 mg total) by mouth at bedtime. 09/01/20   Melony OverlyHurst, Teresa T, PA-C  Multiple Vitamin (MULTIVITAMIN) tablet Take 1 tablet by mouth daily.    [provider]  naproxen (NAPROSYN) 375 MG tablet Take 1 tablet (375 mg total) by mouth 2 (two) times daily as needed for moderate pain. Patient not taking: No sig reported 01/18/20   Clapacs, Jackquline DenmarkJohn T, MD  propranolol (INDERAL) 20 MG tablet Take 1 tablet (20 mg total) by mouth 3 (three) times daily as needed. 09/19/20   Melony OverlyHurst, Teresa T, PA-C  QUEtiapine (SEROQUEL XR) 300 MG 24 hr tablet Take 2 tablets (600 mg total) by mouth at bedtime. 09/01/20   Melony OverlyHurst, Teresa T, PA-C  zinc gluconate 50 MG tablet Take 50 mg by mouth daily.    [provider]  buPROPion (WELLBUTRIN XL) 300 MG 24 hr tablet Take 1 tablet (300 mg total) by mouth daily. 04/03/20 04/03/20  Geoffery Lyonselo, Douglas, MD    Allergies    Robaxin  [methocarbamol] and Toradol [ketorolac tromethamine]  Review of Systems   Review of Systems  Constitutional: Positive for chills and fever.  HENT: Negative for congestion, rhinorrhea and sore throat.   Respiratory: Negative for cough and shortness of breath.   Cardiovascular: Negative for chest pain.  Gastrointestinal: Positive for nausea and vomiting. Negative for abdominal distention, abdominal pain and diarrhea.  Genitourinary: Negative for difficulty urinating and dysuria.  Musculoskeletal: Negative for back pain and neck pain.  Skin: Negative for color change and rash.  Neurological: Negative for dizziness, syncope, light-headedness and headaches.  Psychiatric/Behavioral: Negative for confusion, hallucinations, self-injury and suicidal ideas.    Physical Exam Updated Vital Signs BP 140/90   Pulse 83   Temp 98.1 F (36.7  C)   Resp 16   Ht 5' (1.524 m)   Wt 93.4 kg   LMP 09/26/2020   SpO2 96%   BMI 40.23 kg/m   Physical Exam Vitals and nursing note reviewed.  Constitutional:      General: She is not in acute distress.    Appearance: She is not ill-appearing, toxic-appearing or diaphoretic.  HENT:     Head: Normocephalic.  Eyes:     General: No scleral icterus.       Right eye: No discharge.        Left eye: No discharge.  Cardiovascular:     Rate and Rhythm: Normal rate.     Heart sounds: Normal heart sounds.  Pulmonary:     Effort: Pulmonary effort is normal. No tachypnea, bradypnea or respiratory distress.     Breath sounds: Normal breath sounds. No stridor.  Abdominal:     General: There is no distension. There are no signs of injury.     Palpations: Abdomen is soft. There is no mass or pulsatile mass.     Tenderness: There is no abdominal tenderness. There is no guarding or rebound.  Musculoskeletal:     Cervical back: Neck supple.  Skin:    General: Skin is warm and dry.     Coloration: Skin is not jaundiced or pale.  Neurological:     General: No  focal deficit present.     Mental Status: She is alert.     GCS: GCS eye subscore is 4. GCS verbal subscore is 5. GCS motor subscore is 6.  Psychiatric:        Attention and Perception: She is attentive. She does not perceive auditory or visual hallucinations.        Mood and Affect: Mood is anxious.        Behavior: Behavior is cooperative.        Thought Content: Thought content does not include homicidal or suicidal ideation.     ED Results / Procedures / Treatments   Labs (all labs ordered are listed, but only abnormal results are displayed) Labs Reviewed  SARS CORONAVIRUS 2 (TAT 6-24 HRS)    EKG None  Radiology No results found.  Procedures Procedures   Medications Ordered in ED Medications  hydrOXYzine (ATARAX/VISTARIL) tablet 25 mg (25 mg Oral Given 09/27/20 1929)  ondansetron (ZOFRAN-ODT) disintegrating tablet 4 mg (4 mg Oral Given 09/27/20 1905)    ED Course  I have reviewed the triage vital signs and the nursing notes.  Pertinent labs & imaging results that were available during my care of the patient were reviewed by me and considered in my medical decision making (see chart for details).    MDM Rules/Calculators/A&P                          Alert 45 year old female no acute distress, nontoxic-appearing.  Patient presents with chief complaint of anxiety and flulike symptoms.  Patient reports that her flulike symptoms started 2 days prior.  Patient reports history of anxiety.  States that she is prescribed atenolol but has not gotten this medication yet due to cost.  Patient is requesting Ativan. er chart review patient's psychiatric provider Melony Overly PA-C; dose on Klonopin June 21-21 and also misused benzodiazepines in the past.  Patient denies any suicidal ideations, homicidal ideations, auditory hallucinations, or visualizations.  We will give patient Atarax to help with her anxiety.  Physical exam exam abdomen soft, nondistended, nontender.  Lungs clear to  auscultation bilaterally.  Patient able speak in full complete senses without difficulty.  Is afebrile.  Give patient Zofran to help with nausea.  Will swab patient for COVID due to recent exposure.  Informed by RN that patient refused Atarax and is requesting Ativan.  Spoke with patient about need for avoiding Ativan due to her history of benzodiazepine misuse.  Patient agrees to receive Atarax at this time.  Repeat examination patient reports resolution of her nausea.  Patient was observed to have any vomiting in the emergency department.  We will give patient a short course of Zofran.  We will have patient follow-up with her psychiatrist for management of her anxiety.  Patient given strict return precautions.  Patient expressed understanding of all instructions and is agreeable with this plan.   Final Clinical Impression(s) / ED Diagnoses Final diagnoses:  Anxiety    Rx / DC Orders ED Discharge Orders         Ordered    ondansetron (ZOFRAN ODT) 4 MG disintegrating tablet  Every 8 hours PRN        09/27/20 1952           Berneice Heinrich 09/27/20 Elwanda Brooklyn, MD 09/27/20 2004

## 2020-09-27 NOTE — ED Notes (Signed)
Pt refused Vistaril, stating it does not help.  Requesting Ativan.  ED provider updated.

## 2020-09-27 NOTE — Discharge Instructions (Addendum)
You came to the emergency department today with reports of Covid-19 like symptoms.  These symptoms may be due to Covid 19 or another viral illness.  You have a COVID test pending. Please isolate at home while awaiting your results.  Your results can be found on your [facility] MyChart in the next 6 to 24 hours > If your test is negative, stay home until your fever has resolved/your symptoms are improving. > If your test is positive, isolate at home for at least 7 days after the day your symptoms initially began, and THEN at least 24 hours after you are fever-free without the help of medications (Tylenol/acetaminophen and Advil/ibuprofen/Motrin) AND your symptoms are improving.  You can alternate Tylenol/acetaminophen and Advil/ibuprofen/Motrin every 4 hours for sore throat, body aches, headache or fever.  Drink plenty of water.  Use saline nasal spray for congestion. You can take Zofran every 8 hours as needed for nausea and vomiting. Wash your hands frequently. Please rest as needed with frequent repositioning and ambulation as tolerated.    If you use a CPAP or BiPAP device for management of obstructive sleep apnea may continue to use it however use it when isolated from other individuals to avoid spread of COVID-19.   If you use a nebulizer administer medication such as albuterol you may continue to use it however only one isolated from other individuals to avoid the spread of COVID-19.  If your symptoms do not improve please follow-up with your primary care provider or urgent care.  Return to the ER for significant shortness of breath, uncontrollable vomiting, severe chest pain, inability to tolerate fluids, changes in mental status such as confusion or other concerning symptoms.

## 2020-09-27 NOTE — ED Triage Notes (Signed)
C/o panic attack x 1 hr, also c/o n/v/d x 1 week

## 2020-09-28 LAB — SARS CORONAVIRUS 2 (TAT 6-24 HRS): SARS Coronavirus 2: NEGATIVE

## 2020-09-30 ENCOUNTER — Other Ambulatory Visit: Payer: Self-pay | Admitting: Physician Assistant

## 2020-10-02 ENCOUNTER — Other Ambulatory Visit: Payer: Self-pay | Admitting: Physician Assistant

## 2020-10-02 NOTE — Telephone Encounter (Signed)
Pt's mother Carmine Savoy called requesting pt refill Rx for Seroquel & Gabapentin @ [facility]

## 2020-10-28 ENCOUNTER — Other Ambulatory Visit: Payer: Self-pay | Admitting: Physician Assistant

## 2020-11-13 ENCOUNTER — Other Ambulatory Visit: Payer: Self-pay | Admitting: Physician Assistant

## 2020-11-14 ENCOUNTER — Other Ambulatory Visit: Payer: Self-pay | Admitting: Physician Assistant

## 2020-11-14 NOTE — Telephone Encounter (Signed)
Schedule apt with Rosey Bath for 2 month f/u

## 2020-11-14 NOTE — Telephone Encounter (Signed)
Pt is scheduled for 09/08

## 2020-11-17 ENCOUNTER — Ambulatory Visit: Payer: 59 | Admitting: Physician Assistant

## 2020-11-24 ENCOUNTER — Other Ambulatory Visit: Payer: Self-pay | Admitting: Physician Assistant

## 2020-11-25 ENCOUNTER — Other Ambulatory Visit: Payer: Self-pay

## 2020-11-25 ENCOUNTER — Emergency Department (HOSPITAL_BASED_OUTPATIENT_CLINIC_OR_DEPARTMENT_OTHER)
Admission: EM | Admit: 2020-11-25 | Discharge: 2020-11-25 | Disposition: A | Discharge status: Home / Self Care | Payer: 59 | Attending: Emergency Medicine | Admitting: Emergency Medicine

## 2020-11-25 ENCOUNTER — Emergency Department (HOSPITAL_BASED_OUTPATIENT_CLINIC_OR_DEPARTMENT_OTHER): Payer: 59

## 2020-11-25 ENCOUNTER — Encounter (HOSPITAL_BASED_OUTPATIENT_CLINIC_OR_DEPARTMENT_OTHER): Payer: Self-pay

## 2020-11-25 DIAGNOSIS — D509 Iron deficiency anemia, unspecified: Secondary | ICD-10-CM

## 2020-11-25 DIAGNOSIS — I10 Essential (primary) hypertension: Secondary | ICD-10-CM | POA: Diagnosis not present

## 2020-11-25 DIAGNOSIS — R109 Unspecified abdominal pain: Secondary | ICD-10-CM | POA: Diagnosis not present

## 2020-11-25 DIAGNOSIS — Z87891 Personal history of nicotine dependence: Secondary | ICD-10-CM | POA: Diagnosis not present

## 2020-11-25 DIAGNOSIS — Z79899 Other long term (current) drug therapy: Secondary | ICD-10-CM | POA: Insufficient documentation

## 2020-11-25 DIAGNOSIS — K625 Hemorrhage of anus and rectum: Secondary | ICD-10-CM | POA: Diagnosis not present

## 2020-11-25 LAB — CBC WITH DIFFERENTIAL/PLATELET
Abs Immature Granulocytes: 0.05 10*3/uL (ref 0.00–0.07)
Basophils Absolute: 0 10*3/uL (ref 0.0–0.1)
Basophils Relative: 0 %
Eosinophils Absolute: 0.3 10*3/uL (ref 0.0–0.5)
Eosinophils Relative: 3 %
HCT: 34.5 % — ABNORMAL LOW (ref 36.0–46.0)
Hemoglobin: 11.1 g/dL — ABNORMAL LOW (ref 12.0–15.0)
Immature Granulocytes: 1 %
Lymphocytes Relative: 24 %
Lymphs Abs: 2.6 10*3/uL (ref 0.7–4.0)
MCH: 24.6 pg — ABNORMAL LOW (ref 26.0–34.0)
MCHC: 32.2 g/dL (ref 30.0–36.0)
MCV: 76.5 fL — ABNORMAL LOW (ref 80.0–100.0)
Monocytes Absolute: 1.1 10*3/uL — ABNORMAL HIGH (ref 0.1–1.0)
Monocytes Relative: 10 %
Neutro Abs: 7 10*3/uL (ref 1.7–7.7)
Neutrophils Relative %: 62 %
Platelets: 365 10*3/uL (ref 150–400)
RBC: 4.51 MIL/uL (ref 3.87–5.11)
RDW: 16.9 % — ABNORMAL HIGH (ref 11.5–15.5)
WBC: 11.1 10*3/uL — ABNORMAL HIGH (ref 4.0–10.5)
nRBC: 0 % (ref 0.0–0.2)

## 2020-11-25 LAB — COMPREHENSIVE METABOLIC PANEL
ALT: 14 U/L (ref 0–44)
AST: 24 U/L (ref 15–41)
Albumin: 3.7 g/dL (ref 3.5–5.0)
Alkaline Phosphatase: 111 U/L (ref 38–126)
Anion gap: 8 (ref 5–15)
BUN: 12 mg/dL (ref 6–20)
CO2: 23 mmol/L (ref 22–32)
Calcium: 8.7 mg/dL — ABNORMAL LOW (ref 8.9–10.3)
Chloride: 105 mmol/L (ref 98–111)
Creatinine, Ser: 0.84 mg/dL (ref 0.44–1.00)
GFR, Estimated: >60 mL/min (ref >60)
Glucose, Bld: 118 mg/dL — ABNORMAL HIGH (ref 70–99)
Potassium: 3.9 mmol/L (ref 3.5–5.1)
Sodium: 136 mmol/L (ref 135–145)
Total Bilirubin: 0.2 mg/dL — ABNORMAL LOW (ref 0.3–1.2)
Total Protein: 6.6 g/dL (ref 6.5–8.1)

## 2020-11-25 LAB — OCCULT BLOOD X 1 CARD TO LAB, STOOL: Fecal Occult Bld: POSITIVE — AB

## 2020-11-25 MED ORDER — HYDROCORTISONE ACETATE 25 MG RE SUPP: 25.0000 mg | Freq: Two times a day (BID) | RECTAL | 0 refills | Status: DC

## 2020-11-25 MED ORDER — ONDANSETRON 4 MG PO TBDP: 4.0000 mg | ORAL_TABLET | Freq: Three times a day (TID) | ORAL | 0 refills | Status: DC | PRN

## 2020-11-25 MED ORDER — DICYCLOMINE HCL 20 MG PO TABS: 20.0000 mg | ORAL_TABLET | Freq: Two times a day (BID) | ORAL | 0 refills | Status: DC

## 2020-11-25 MED ORDER — FERROUS SULFATE 325 (65 FE) MG PO TABS: 325.0000 mg | ORAL_TABLET | Freq: Three times a day (TID) | ORAL | 0 refills | Status: AC

## 2020-11-25 MED ORDER — OXYCODONE-ACETAMINOPHEN 5-325 MG PO TABS
1.0000 | ORAL_TABLET | Freq: Once | ORAL | Status: AC
  Administered 2020-11-25: 1 via ORAL
  Filled 2020-11-25: qty 1

## 2020-11-25 MED ORDER — ONDANSETRON 4 MG PO TBDP
8.0000 mg | ORAL_TABLET | Freq: Once | ORAL | Status: AC
  Administered 2020-11-25: 8 mg via ORAL
  Filled 2020-11-25: qty 2

## 2020-11-25 NOTE — ED Provider Notes (Signed)
[facility] EMERGENCY DEPARTMENT Provider Note   CSN: [id number] Arrival date & time: 11/25/20  0701     History Chief Complaint  Patient presents with   Shortness of Breath    For the past few days, has had a cough, as well as abdominal pain and diarrhea and rectal bleeding    Marilyn Fowler is a 45 y.o. female.  She states that at one point, her hemoglobin dropped to 6.  She is here to make sure her hemoglobin is not low.  Endorses shortness of breath, but states that this followed her rectal bleeding.  She has tested negative for COVID-19 recently.  The history is provided by the patient.  Rectal Bleeding Quality:  Bright red Amount:  Copious Duration:  2 days Timing:  Intermittent Chronicity:  Recurrent Context: hemorrhoids   Similar prior episodes: yes   Relieved by:  Nothing Worsened by:  Nothing Ineffective treatments:  None tried Associated symptoms: abdominal pain (lower, cramping, like she is going to have to have diarrhea) and vomiting   Associated symptoms: no fever and no hematemesis       Past Medical History:  Diagnosis Date   Alcohol abuse    Anemia    Anemia    Anxiety    Blood transfusion without reported diagnosis    Depression    Hemorrhoid    Hypertension    Migraine     Patient Active Problem List   Diagnosis Date Noted   History of suicide attempt 05/29/2020   Benzodiazepine abuse (HCC) 05/29/2020   Insomnia 05/29/2020   Sprained ankle 01/14/2020   Bipolar depression (HCC) 01/13/2020   Physical exam 08/07/2017   Headache 06/30/2017   Chronic right shoulder pain 09/04/2016   Hypokalemia 04/30/2016   Hypomagnesemia 04/30/2016   Alcoholic hepatitis 04/30/2016   Alcohol induced fatty liver 04/30/2016   Alcohol abuse 04/29/2016   Left medial knee pain 06/29/2015   Acute bacterial sinusitis 10/11/2014   Maxillary sinusitis, acute 09/14/2014   Gastroenteritis 11/09/2013   Pleuritic chest pain 09/17/2013   Chest pain  11/18/2012   Morbid obesity (HCC) 09/29/2012   HTN (hypertension) 07/12/2012   Migraine 07/12/2012   Bipolar II disorder, most recent episode major depressive (HCC) 07/11/2012    Past Surgical History:  Procedure Laterality Date   HEMORRHOID SURGERY     HERNIA REPAIR     TONSILLECTOMY     VENTRAL HERNIA REPAIR       OB History   No obstetric history on file.     Family History  Problem Relation Age of Onset   Osteoarthritis Mother    Diabetes Mother    Hypertension Mother    Depression Mother    Hypertension Father    Diabetes Father    Asthma Son    Deep vein thrombosis Maternal Grandmother    Deep vein thrombosis Maternal Grandfather    Stroke Neg Hx     Social History   Tobacco Use   Smoking status: Former    Years: 10.00    Types: Cigarettes    Quit date: 04/12/2020    Years since quitting: 0.6   Smokeless tobacco: Never  Vaping Use   Vaping Use: Never used  Substance Use Topics   Alcohol use: Not Currently   Drug use: No    Home Medications Prior to Admission medications   Medication Sig Start Date End Date Taking? Authorizing Provider  amLODipine (NORVASC) 2.5 MG tablet Take 1 tablet (2.5 mg total) by mouth  daily. 01/19/20   Clapacs, Jackquline Denmark, MD  Ascorbic Acid (VITAMIN C PO) Take 1 tablet by mouth daily.    [provider]  Cholecalciferol (VITAMIN D3 PO) Take 1 tablet by mouth daily. Patient not taking: Reported on 09/19/2020    [provider]  diphenhydrAMINE (BENADRYL) 50 MG capsule Take 50 mg by mouth every 8 (eight) hours as needed.    [provider]  ferrous sulfate 325 (65 FE) MG tablet Take 1 tablet (325 mg total) by mouth daily. 07/13/20   Terrilee Files, MD  gabapentin (NEURONTIN) 400 MG capsule TAKE ONE CAPSULE BY MOUTH THREE TIMES A DAY 10/31/20   Melony Overly T, PA-C  ibuprofen (ADVIL) 200 MG tablet Take 400 mg by mouth every 4 (four) hours as needed for headache (pain).    [provider]  metoprolol  succinate (TOPROL-XL) 25 MG 24 hr tablet Take 1 tablet (25 mg total) by mouth daily. 01/19/20   Clapacs, Jackquline Denmark, MD  mirtazapine (REMERON) 30 MG tablet TAKE 1 TABLET BY MOUTH EVERYDAY AT BEDTIME 11/14/20   Melony Overly T, PA-C  Multiple Vitamin (MULTIVITAMIN) tablet Take 1 tablet by mouth daily.    [provider]  naproxen (NAPROSYN) 375 MG tablet Take 1 tablet (375 mg total) by mouth 2 (two) times daily as needed for moderate pain. Patient not taking: No sig reported 01/18/20   Clapacs, Jackquline Denmark, MD  ondansetron (ZOFRAN ODT) 4 MG disintegrating tablet Take 1 tablet (4 mg total) by mouth every 8 (eight) hours as needed for nausea or vomiting. 09/27/20   Haskel Schroeder, PA-C  propranolol (INDERAL) 20 MG tablet TAKE ONE TABLET BY MOUTH THREE TIMES A DAY AS NEEDED 11/24/20   Melony Overly T, PA-C  QUEtiapine (SEROQUEL XR) 300 MG 24 hr tablet TAKE TWO TABLETS BY MOUTH EVERY NIGHT AT BEDTIME 10/31/20   Melony Overly T, PA-C  zinc gluconate 50 MG tablet Take 50 mg by mouth daily.    [provider]  buPROPion (WELLBUTRIN XL) 300 MG 24 hr tablet Take 1 tablet (300 mg total) by mouth daily. 04/03/20 04/03/20  Geoffery Lyons, MD    Allergies    Robaxin [methocarbamol] and Toradol [ketorolac tromethamine]  Review of Systems   Review of Systems  Constitutional:  Negative for chills and fever.  HENT:  Negative for ear pain and sore throat.   Eyes:  Negative for pain and visual disturbance.  Respiratory:  Positive for shortness of breath. Negative for cough.   Cardiovascular:  Negative for chest pain and palpitations.  Gastrointestinal:  Positive for abdominal pain (lower, cramping, like she is going to have to have diarrhea), anal bleeding, blood in stool, diarrhea, hematochezia, nausea and vomiting. Negative for hematemesis.  Genitourinary:  Negative for dysuria and hematuria.  Musculoskeletal:  Negative for arthralgias and back pain.  Skin:  Negative for color change and rash.   Neurological:  Negative for seizures and syncope.  All other systems reviewed and are negative.  Physical Exam Updated Vital Signs BP 133/85 (BP Location: Right Arm)   Pulse 88   Temp 98.5 F (36.9 C) (Oral)   Resp (!) 22   Ht 5' (1.524 m)   Wt (!) 141.5 kg   SpO2 100%   BMI 60.93 kg/m   Physical Exam Vitals and nursing note reviewed.  Constitutional:      Appearance: She is well-developed.  HENT:     Head: Normocephalic and atraumatic.  Cardiovascular:     Rate  and Rhythm: Normal rate and regular rhythm.     Heart sounds: Normal heart sounds.  Pulmonary:     Effort: Pulmonary effort is normal. No tachypnea.     Breath sounds: Normal breath sounds.     Comments: Appears to be voluntarily hyperventilating; no respiratory distress Abdominal:     Palpations: Abdomen is soft.     Tenderness: There is no abdominal tenderness.  Genitourinary:    Comments: Non-thrombosed external hemorrhoids with scant blood at the perineum but no active rectal bleeding. Musculoskeletal:     Right lower leg: No edema.     Left lower leg: No edema.  Skin:    General: Skin is warm and dry.  Neurological:     General: No focal deficit present.     Mental Status: She is alert and oriented to person, place, and time.  Psychiatric:        Mood and Affect: Mood normal.        Behavior: Behavior normal.    ED Results / Procedures / Treatments   Labs (all labs ordered are listed, but only abnormal results are displayed) Labs Reviewed  COMPREHENSIVE METABOLIC PANEL - Abnormal; Notable for the following components:      Result Value   Glucose, Bld 118 (*)    Calcium 8.7 (*)    Total Bilirubin 0.2 (*)    All other components within normal limits  CBC WITH DIFFERENTIAL/PLATELET - Abnormal; Notable for the following components:   WBC 11.1 (*)    Hemoglobin 11.1 (*)    HCT 34.5 (*)    MCV 76.5 (*)    MCH 24.6 (*)    RDW 16.9 (*)    Monocytes Absolute 1.1 (*)    All other components within  normal limits  OCCULT BLOOD X 1 CARD TO LAB, STOOL - Abnormal; Notable for the following components:   Fecal Occult Bld POSITIVE (*)    All other components within normal limits    EKG EKG Interpretation  Date/Time:  Saturday November 25 2020 07:16:36 EDT Ventricular Rate:  85 PR Interval:  127 QRS Duration: 82 QT Interval:  373 QTC Calculation: 444 R Axis:   -26 Text Interpretation: Sinus rhythm Borderline left axis deviation Low voltage, precordial leads no acute ischemia Confirmed by Pieter Partridge ([id number]) on 11/25/2020 7:31:19 AM  Radiology DG Chest Port 1 View  Result Date: 11/25/2020 CLINICAL DATA:  Shortness of breath and abdominal pain EXAM: PORTABLE CHEST 1 VIEW COMPARISON:  09/12/20 FINDINGS: Normal heart size. No pleural effusion or edema identified. Lungs are hypoinflated. Status no airspace densities identified. Review of the visualized osseous structures is unremarkable. IMPRESSION: No acute cardiopulmonary abnormalities. Electronically Signed   By: Signa Kell M.D.   On: 11/25/2020 08:51    Procedures Procedures   Medications Ordered in ED Medications  oxyCODONE-acetaminophen (PERCOCET/ROXICET) 5-325 MG per tablet 1 tablet (has no administration in time range)  ondansetron (ZOFRAN-ODT) disintegrating tablet 8 mg (has no administration in time range)    ED Course  I have reviewed the triage vital signs and the nursing notes.  Pertinent labs & imaging results that were available during my care of the patient were reviewed by me and considered in my medical decision making (see chart for details).    MDM Rules/Calculators/A&P                           [name] presented with rectal bleeding, consistent with history of  hemorrhoids, and shortness of breath, attributable to her concern for anemia.  She was noted to be tachypneic in the initial assessment but by discharge, her respiratory rate was normal.  She was evaluated for evidence of pneumonia, pneumothorax,  pulmonary edema.  EKG did not show evidence of ACS.  She was found to be slightly anemic but not in need of transfusion.  I have asked her to resume use of iron, and we did discuss the use of this medication including ways to improve absorption and minimize side effects.  I have prescribed Anusol suppositories.  I did not think abdominal imaging was needed as her exam was not consistent with acute intra-abdominal pathology such as diverticulitis, mesenteric ischemia, etc.  [facility], and she has had a colonoscopy several years ago.  I did ask her to follow closely with her primary care provider and to seek GI or surgery referral. Final Clinical Impression(s) / ED Diagnoses Final diagnoses:  Rectal bleeding  Iron deficiency anemia, unspecified iron deficiency anemia type    Rx / DC Orders ED Discharge Orders          Ordered    hydrocortisone (ANUSOL-HC) 25 MG suppository  2 times daily        11/25/20 0901    ondansetron (ZOFRAN ODT) 4 MG disintegrating tablet  Every 8 hours PRN        11/25/20 0901    dicyclomine (BENTYL) 20 MG tablet  2 times daily        11/25/20 0901    ferrous sulfate 325 (65 FE) MG tablet  3 times daily with meals        11/25/20 0901             Koleen DistanceWright, Nicholad Kautzman G, MD 11/25/20 [phone]

## 2020-11-25 NOTE — ED Triage Notes (Signed)
Pt c/o sob and abdominal pain that started Thursday night with diarrhea. Pt with recent cold, no cough just stuffy.

## 2020-11-26 ENCOUNTER — Other Ambulatory Visit: Payer: Self-pay | Admitting: Physician Assistant

## 2020-12-04 ENCOUNTER — Telehealth: Payer: Self-pay | Admitting: Physician Assistant

## 2020-12-05 ENCOUNTER — Other Ambulatory Visit: Payer: Self-pay

## 2020-12-05 MED ORDER — QUETIAPINE FUMARATE ER 300 MG PO TB24: ORAL_TABLET | ORAL | 0 refills | Status: DC

## 2020-12-05 NOTE — Telephone Encounter (Signed)
Pt called in for refill on Seroquel XR 300mg . Appt 9/8. Pharmacy 11/8 [address] [place], [place]

## 2020-12-18 ENCOUNTER — Other Ambulatory Visit: Payer: Self-pay

## 2020-12-19 ENCOUNTER — Encounter (HOSPITAL_BASED_OUTPATIENT_CLINIC_OR_DEPARTMENT_OTHER): Payer: Self-pay

## 2020-12-19 ENCOUNTER — Emergency Department (HOSPITAL_BASED_OUTPATIENT_CLINIC_OR_DEPARTMENT_OTHER)
Admission: EM | Admit: 2020-12-19 | Discharge: 2020-12-19 | Disposition: A | Discharge status: Home / Self Care | Payer: 59 | Attending: Emergency Medicine | Admitting: Emergency Medicine

## 2020-12-19 ENCOUNTER — Other Ambulatory Visit: Payer: Self-pay

## 2020-12-19 DIAGNOSIS — Z79899 Other long term (current) drug therapy: Secondary | ICD-10-CM | POA: Diagnosis not present

## 2020-12-19 DIAGNOSIS — Z87891 Personal history of nicotine dependence: Secondary | ICD-10-CM | POA: Insufficient documentation

## 2020-12-19 DIAGNOSIS — K029 Dental caries, unspecified: Secondary | ICD-10-CM | POA: Insufficient documentation

## 2020-12-19 DIAGNOSIS — I10 Essential (primary) hypertension: Secondary | ICD-10-CM | POA: Diagnosis not present

## 2020-12-19 DIAGNOSIS — K0889 Other specified disorders of teeth and supporting structures: Secondary | ICD-10-CM | POA: Diagnosis present

## 2020-12-19 MED ORDER — BUPIVACAINE-EPINEPHRINE (PF) 0.5% -1:200000 IJ SOLN
1.8000 mL | Freq: Once | INTRAMUSCULAR | Status: AC
  Administered 2020-12-19: 1.8 mL
  Filled 2020-12-19: qty 1.8

## 2020-12-19 MED ORDER — PENICILLIN V POTASSIUM 500 MG PO TABS: 500.0000 mg | ORAL_TABLET | Freq: Four times a day (QID) | ORAL | 0 refills | Status: AC

## 2020-12-19 MED ORDER — NAPROXEN 500 MG PO TABS
500.0000 mg | ORAL_TABLET | Freq: Two times a day (BID) | ORAL | 0 refills | Status: DC
Start: 2020-12-19 — End: 2021-01-13

## 2020-12-19 MED ORDER — IBUPROFEN 800 MG PO TABS
800.0000 mg | ORAL_TABLET | Freq: Once | ORAL | Status: AC
  Administered 2020-12-19: 800 mg via ORAL
  Filled 2020-12-19: qty 1

## 2020-12-19 NOTE — ED Triage Notes (Signed)
Pt reports dental pain x 1 week. She has two decayed teeth on bottom left of her mouth but states she has not been to dentist due to lack of insurance.

## 2020-12-19 NOTE — ED Provider Notes (Signed)
[facility] EMERGENCY DEPARTMENT Provider Note   CSN: [id number] Arrival date & time: 12/19/20  0015     History Chief Complaint  Patient presents with   Dental Pain    Marilyn Fowler is a 45 y.o. female.  HPI     This a 45 year old female with a history of hypertension, obesity, anemia who presents with dental pain.  Patient reports over the last several days she has had increasing left lower dental and jaw pain.  She states it radiates backwards into her ear.  Denies difficulty swallowing.  She has not taken anything for the pain.  She is unable to see a dentist because of lack of insurance.  No fevers.  She reports in the past she has had similar symptoms and improved with a shot and antibiotics.  Patient states she has been unable to sleep.  Rates her pain at 10 out of 10.  Past Medical History:  Diagnosis Date   Alcohol abuse    Anemia    Anemia    Anxiety    Blood transfusion without reported diagnosis    Depression    Hemorrhoid    Hypertension    Migraine     Patient Active Problem List   Diagnosis Date Noted   History of suicide attempt 05/29/2020   Benzodiazepine abuse (HCC) 05/29/2020   Insomnia 05/29/2020   Sprained ankle 01/14/2020   Bipolar depression (HCC) 01/13/2020   Physical exam 08/07/2017   Headache 06/30/2017   Chronic right shoulder pain 09/04/2016   Hypokalemia 04/30/2016   Hypomagnesemia 04/30/2016   Alcoholic hepatitis 04/30/2016   Alcohol induced fatty liver 04/30/2016   Alcohol abuse 04/29/2016   Left medial knee pain 06/29/2015   Acute bacterial sinusitis 10/11/2014   Maxillary sinusitis, acute 09/14/2014   Gastroenteritis 11/09/2013   Pleuritic chest pain 09/17/2013   Chest pain 11/18/2012   Morbid obesity (HCC) 09/29/2012   HTN (hypertension) 07/12/2012   Migraine 07/12/2012   Bipolar II disorder, most recent episode major depressive (HCC) 07/11/2012    Past Surgical History:  Procedure Laterality Date    HEMORRHOID SURGERY     HERNIA REPAIR     TONSILLECTOMY     VENTRAL HERNIA REPAIR       OB History   No obstetric history on file.     Family History  Problem Relation Age of Onset   Osteoarthritis Mother    Diabetes Mother    Hypertension Mother    Depression Mother    Hypertension Father    Diabetes Father    Asthma Son    Deep vein thrombosis Maternal Grandmother    Deep vein thrombosis Maternal Grandfather    Stroke Neg Hx     Social History   Tobacco Use   Smoking status: Former    Years: 10.00    Types: Cigarettes    Quit date: 04/12/2020    Years since quitting: 0.6   Smokeless tobacco: Never  Vaping Use   Vaping Use: Never used  Substance Use Topics   Alcohol use: Not Currently   Drug use: No    Home Medications Prior to Admission medications   Medication Sig Start Date End Date Taking? Authorizing Provider  naproxen (NAPROSYN) 500 MG tablet Take 1 tablet (500 mg total) by mouth 2 (two) times daily. 12/19/20  Yes Ilai Hiller, Mayer Masker, MD  penicillin v potassium (VEETID) 500 MG tablet Take 1 tablet (500 mg total) by mouth 4 (four) times daily for 10 days. 12/19/20 12/29/20  Yes Blayklee Mable, Mayer Maskerourtney F, MD  amLODipine (NORVASC) 2.5 MG tablet Take 1 tablet (2.5 mg total) by mouth daily. 01/19/20   Clapacs, Jackquline DenmarkJohn T, MD  Ascorbic Acid (VITAMIN C PO) Take 1 tablet by mouth daily.    [provider]  Cholecalciferol (VITAMIN D3 PO) Take 1 tablet by mouth daily. Patient not taking: Reported on 09/19/2020    [provider]  dicyclomine (BENTYL) 20 MG tablet Take 1 tablet (20 mg total) by mouth 2 (two) times daily. 11/25/20   Koleen DistanceWright, Anna G, MD  diphenhydrAMINE (BENADRYL) 50 MG capsule Take 50 mg by mouth every 8 (eight) hours as needed.    [provider]  ferrous sulfate 325 (65 FE) MG tablet Take 1 tablet (325 mg total) by mouth 3 (three) times daily with meals. 11/25/20 12/25/20  Koleen DistanceWright, Anna G, MD  gabapentin (NEURONTIN) 400 MG capsule TAKE ONE CAPSULE  BY MOUTH THREE TIMES A DAY 11/27/20   Hurst, Glade Nurseeresa T, PA-C  hydrocortisone (ANUSOL-HC) 25 MG suppository Place 1 suppository (25 mg total) rectally 2 (two) times daily. 11/25/20   Koleen DistanceWright, Anna G, MD  ibuprofen (ADVIL) 200 MG tablet Take 400 mg by mouth every 4 (four) hours as needed for headache (pain).    [provider]  metoprolol succinate (TOPROL-XL) 25 MG 24 hr tablet Take 1 tablet (25 mg total) by mouth daily. 01/19/20   Clapacs, Jackquline DenmarkJohn T, MD  mirtazapine (REMERON) 30 MG tablet TAKE 1 TABLET BY MOUTH EVERYDAY AT BEDTIME 11/14/20   Melony OverlyHurst, Teresa T, PA-C  Multiple Vitamin (MULTIVITAMIN) tablet Take 1 tablet by mouth daily.    [provider]  ondansetron (ZOFRAN ODT) 4 MG disintegrating tablet Take 1 tablet (4 mg total) by mouth every 8 (eight) hours as needed for nausea or vomiting. 11/25/20   Koleen DistanceWright, Anna G, MD  propranolol (INDERAL) 20 MG tablet TAKE ONE TABLET BY MOUTH THREE TIMES A DAY AS NEEDED 11/24/20   Melony OverlyHurst, Teresa T, PA-C  QUEtiapine (SEROQUEL XR) 300 MG 24 hr tablet TAKE TWO TABLETS BY MOUTH EVERY NIGHT AT BEDTIME 12/05/20   Melony OverlyHurst, Teresa T, PA-C  zinc gluconate 50 MG tablet Take 50 mg by mouth daily.    [provider]  buPROPion (WELLBUTRIN XL) 300 MG 24 hr tablet Take 1 tablet (300 mg total) by mouth daily. 04/03/20 04/03/20  Geoffery Lyonselo, Douglas, MD    Allergies    Robaxin [methocarbamol] and Toradol [ketorolac tromethamine]  Review of Systems   Review of Systems  Constitutional:  Negative for fever.  HENT:  Positive for dental problem. Negative for facial swelling and trouble swallowing.   All other systems reviewed and are negative.  Physical Exam Updated Vital Signs BP (!) 177/119 (BP Location: Right Arm)   Pulse (!) 134   Temp 98.7 F (37.1 C) (Oral)   Resp 18   Ht 1.524 m (5')   Wt (!) 139.3 kg   LMP 12/13/2020 (Approximate)   SpO2 98%   BMI 59.96 kg/m   Physical Exam Vitals and nursing note reviewed.  Constitutional:      Appearance: She is  well-developed. She is obese. She is not toxic-appearing.     Comments: Morbidly obese,  HENT:     Head: Normocephalic and atraumatic.     Mouth/Throat:     Comments: Generally poor dentition, multiple decayed teeth, tenderness palpation along the periapical space in the left lower jaw, no palpable abscess, no trismus, no fullness under the tongue Eyes:     Pupils:  Pupils are equal, round, and reactive to light.  Cardiovascular:     Rate and Rhythm: Normal rate and regular rhythm.  Pulmonary:     Effort: Pulmonary effort is normal. No respiratory distress.  Abdominal:     Palpations: Abdomen is soft.  Musculoskeletal:     Cervical back: Neck supple.     Right lower leg: No edema.     Left lower leg: No edema.  Skin:    General: Skin is warm and dry.  Neurological:     Mental Status: She is alert and oriented to person, place, and time.  Psychiatric:        Mood and Affect: Mood normal.    ED Results / Procedures / Treatments   Labs (all labs ordered are listed, but only abnormal results are displayed) Labs Reviewed - No data to display  EKG None  Radiology No results found.  Procedures Dental Block  Date/Time: 12/19/2020 1:27 AM Performed by: Shon Baton, MD Authorized by: Shon Baton, MD   Consent:    Consent obtained:  Verbal   Consent given by:  Patient   Risks, benefits, and alternatives were discussed: yes     Risks discussed:  Infection, allergic reaction and nerve damage   Alternatives discussed:  No treatment Universal protocol:    Patient identity confirmed:  Verbally with patient Indications:    Indications: dental pain   Location:    Block type:  Inferior alveolar   Laterality:  Left Procedure details:    Syringe type:  Aspirating dental syringe   Needle gauge:  27 G   Anesthetic injected:  Bupivacaine 0.5% WITH epi   Injection procedure:  Anatomic landmarks identified, introduced needle and anatomic landmarks  palpated Post-procedure details:    Outcome:  Anesthesia achieved   Procedure completion:  Tolerated   Medications Ordered in ED Medications  bupivacaine-epinephrine (MARCAINE W/ EPI) 0.5% -1:200000 injection 1.8 mL (1.8 mLs Infiltration Given 12/19/20 0048)  ibuprofen (ADVIL) tablet 800 mg (800 mg Oral Given 12/19/20 0048)    ED Course  I have reviewed the triage vital signs and the nursing notes.  Pertinent labs & imaging results that were available during my care of the patient were reviewed by me and considered in my medical decision making (see chart for details).    MDM Rules/Calculators/A&P                           Patient presents with dental pain.  She is overall nontoxic.  Vital signs notable for heart rate in 130s.  She reports significant pain.  No evidence of palpable or drainable abscess on exam.  Suspect occult infection.  Inferior alveolar block was performed with good results.  Will place on penicillin and patient was provided with dental resources.  After history, exam, and medical workup I feel the patient has been appropriately medically screened and is safe for discharge home. Pertinent diagnoses were discussed with the patient. Patient was given return precautions.  Final Clinical Impression(s) / ED Diagnoses Final diagnoses:  Pain, dental  Dental caries    Rx / DC Orders ED Discharge Orders          Ordered    penicillin v potassium (VEETID) 500 MG tablet  4 times daily        12/19/20 0125    naproxen (NAPROSYN) 500 MG tablet  2 times daily        12/19/20 0125  Shon Baton, MD 12/19/20 [phone]

## 2020-12-19 NOTE — Discharge Instructions (Addendum)
You likely have an underlying dental infection.  Take antibiotics as prescribed.  See dental resources for follow-up.

## 2021-01-03 ENCOUNTER — Other Ambulatory Visit: Payer: Self-pay | Admitting: Physician Assistant

## 2021-01-04 ENCOUNTER — Ambulatory Visit: Payer: 59 | Admitting: Physician Assistant

## 2021-01-13 ENCOUNTER — Other Ambulatory Visit: Payer: Self-pay

## 2021-01-13 ENCOUNTER — Encounter (HOSPITAL_BASED_OUTPATIENT_CLINIC_OR_DEPARTMENT_OTHER): Payer: Self-pay | Admitting: *Deleted

## 2021-01-13 ENCOUNTER — Emergency Department (HOSPITAL_BASED_OUTPATIENT_CLINIC_OR_DEPARTMENT_OTHER)
Admission: EM | Admit: 2021-01-13 | Discharge: 2021-01-13 | Disposition: A | Discharge status: Home / Self Care | Payer: 59 | Attending: Emergency Medicine | Admitting: Emergency Medicine

## 2021-01-13 DIAGNOSIS — Z79899 Other long term (current) drug therapy: Secondary | ICD-10-CM | POA: Diagnosis not present

## 2021-01-13 DIAGNOSIS — I10 Essential (primary) hypertension: Secondary | ICD-10-CM | POA: Diagnosis not present

## 2021-01-13 DIAGNOSIS — K0889 Other specified disorders of teeth and supporting structures: Secondary | ICD-10-CM | POA: Diagnosis present

## 2021-01-13 DIAGNOSIS — K029 Dental caries, unspecified: Secondary | ICD-10-CM | POA: Insufficient documentation

## 2021-01-13 DIAGNOSIS — Z87891 Personal history of nicotine dependence: Secondary | ICD-10-CM | POA: Diagnosis not present

## 2021-01-13 MED ORDER — NAPROXEN 500 MG PO TABS: 500.0000 mg | ORAL_TABLET | Freq: Two times a day (BID) | ORAL | 0 refills | Status: DC | PRN

## 2021-01-13 MED ORDER — PENICILLIN V POTASSIUM 500 MG PO TABS: 500.0000 mg | ORAL_TABLET | Freq: Four times a day (QID) | ORAL | 0 refills | Status: DC

## 2021-01-13 MED ORDER — PENICILLIN V POTASSIUM 250 MG PO TABS
500.0000 mg | ORAL_TABLET | Freq: Once | ORAL | Status: AC
  Administered 2021-01-13: 500 mg via ORAL
  Filled 2021-01-13: qty 2

## 2021-01-13 MED ORDER — BUPIVACAINE HCL 0.5 % IJ SOLN: 20.0000 mL | Freq: Once | INTRAMUSCULAR | Status: DC

## 2021-01-13 MED ORDER — IBUPROFEN 800 MG PO TABS
800.0000 mg | ORAL_TABLET | Freq: Once | ORAL | Status: DC
  Filled 2021-01-13: qty 1

## 2021-01-13 MED ORDER — BUPIVACAINE-EPINEPHRINE (PF) 0.5% -1:200000 IJ SOLN
1.8000 mL | Freq: Once | INTRAMUSCULAR | Status: AC
  Administered 2021-01-13: 1.8 mL
  Filled 2021-01-13: qty 1.8

## 2021-01-13 NOTE — Discharge Instructions (Signed)
Take the antibiotics and pain medication as prescribed.  Follow-up with your dentist for tooth extraction.  Return to the ED with difficulty breathing, difficulty swallowing, or any other concerns

## 2021-01-13 NOTE — ED Triage Notes (Signed)
Dental pain left lower jaw.  Hx of same. Reports she cannot see the dentist until December 1st.

## 2021-01-13 NOTE — ED Provider Notes (Signed)
[facility] EMERGENCY DEPARTMENT Provider Note   CSN: [id number] Arrival date & time: 01/13/21  0346     History Chief Complaint  Patient presents with   Dental Pain    Marilyn Fowler is a 45 y.o. female.  Patient with a history of obesity and hypertension here with left-sided lower dental pain for the past several months.  Similar visit here in August.  States she has had increasing pain over the past 2 or 3 days but denies any new injury.  No she has bad teeth and cannot see dentist and insurance issues.  She has been taking Tylenol and ibuprofen at home without relief.  Pain is to her left lower jaw.  No difficulty breathing or difficulty swallowing.  States had fevers and diarrhea last week when she had the "flu" with this is resolved.  No bleeding or drainage from her mouth.  No difficulty breathing or difficulty swallowing.  No chest pain or shortness of breath.  The history is provided by the patient.  Dental Pain Associated symptoms: no fever and no headaches       Past Medical History:  Diagnosis Date   Alcohol abuse    Anemia    Anemia    Anxiety    Blood transfusion without reported diagnosis    Depression    Hemorrhoid    Hypertension    Migraine     Patient Active Problem List   Diagnosis Date Noted   History of suicide attempt 05/29/2020   Benzodiazepine abuse (HCC) 05/29/2020   Insomnia 05/29/2020   Sprained ankle 01/14/2020   Bipolar depression (HCC) 01/13/2020   Physical exam 08/07/2017   Headache 06/30/2017   Chronic right shoulder pain 09/04/2016   Hypokalemia 04/30/2016   Hypomagnesemia 04/30/2016   Alcoholic hepatitis 04/30/2016   Alcohol induced fatty liver 04/30/2016   Alcohol abuse 04/29/2016   Left medial knee pain 06/29/2015   Acute bacterial sinusitis 10/11/2014   Maxillary sinusitis, acute 09/14/2014   Gastroenteritis 11/09/2013   Pleuritic chest pain 09/17/2013   Chest pain 11/18/2012   Morbid obesity (HCC) 09/29/2012    HTN (hypertension) 07/12/2012   Migraine 07/12/2012   Bipolar II disorder, most recent episode major depressive (HCC) 07/11/2012    Past Surgical History:  Procedure Laterality Date   HEMORRHOID SURGERY     HERNIA REPAIR     TONSILLECTOMY     VENTRAL HERNIA REPAIR       OB History   No obstetric history on file.     Family History  Problem Relation Age of Onset   Osteoarthritis Mother    Diabetes Mother    Hypertension Mother    Depression Mother    Hypertension Father    Diabetes Father    Asthma Son    Deep vein thrombosis Maternal Grandmother    Deep vein thrombosis Maternal Grandfather    Stroke Neg Hx     Social History   Tobacco Use   Smoking status: Former    Years: 10.00    Types: Cigarettes    Quit date: 04/12/2020    Years since quitting: 0.7   Smokeless tobacco: Never  Vaping Use   Vaping Use: Never used  Substance Use Topics   Alcohol use: Not Currently   Drug use: No    Home Medications Prior to Admission medications   Medication Sig Start Date End Date Taking? Authorizing Provider  amLODipine (NORVASC) 2.5 MG tablet Take 1 tablet (2.5 mg total) by mouth daily. 01/19/20  Clapacs, Jackquline Denmark, MD  Ascorbic Acid (VITAMIN C PO) Take 1 tablet by mouth daily.    [provider]  Cholecalciferol (VITAMIN D3 PO) Take 1 tablet by mouth daily. Patient not taking: Reported on 09/19/2020    [provider]  dicyclomine (BENTYL) 20 MG tablet Take 1 tablet (20 mg total) by mouth 2 (two) times daily. 11/25/20   Koleen Distance, MD  diphenhydrAMINE (BENADRYL) 50 MG capsule Take 50 mg by mouth every 8 (eight) hours as needed.    [provider]  ferrous sulfate 325 (65 FE) MG tablet Take 1 tablet (325 mg total) by mouth 3 (three) times daily with meals. 11/25/20 12/25/20  Koleen Distance, MD  gabapentin (NEURONTIN) 400 MG capsule TAKE ONE CAPSULE BY MOUTH THREE TIMES A DAY 01/04/21   Hurst, Glade Nurse, PA-C  hydrocortisone (ANUSOL-HC) 25 MG  suppository Place 1 suppository (25 mg total) rectally 2 (two) times daily. 11/25/20   Koleen Distance, MD  ibuprofen (ADVIL) 200 MG tablet Take 400 mg by mouth every 4 (four) hours as needed for headache (pain).    [provider]  metoprolol succinate (TOPROL-XL) 25 MG 24 hr tablet Take 1 tablet (25 mg total) by mouth daily. 01/19/20   Clapacs, Jackquline Denmark, MD  mirtazapine (REMERON) 30 MG tablet TAKE 1 TABLET BY MOUTH EVERYDAY AT BEDTIME 11/14/20   Melony Overly T, PA-C  Multiple Vitamin (MULTIVITAMIN) tablet Take 1 tablet by mouth daily.    [provider]  naproxen (NAPROSYN) 500 MG tablet Take 1 tablet (500 mg total) by mouth 2 (two) times daily. 12/19/20   Horton, Mayer Masker, MD  ondansetron (ZOFRAN ODT) 4 MG disintegrating tablet Take 1 tablet (4 mg total) by mouth every 8 (eight) hours as needed for nausea or vomiting. 11/25/20   Koleen Distance, MD  propranolol (INDERAL) 20 MG tablet TAKE ONE TABLET BY MOUTH THREE TIMES A DAY AS NEEDED 11/24/20   Melony Overly T, PA-C  QUEtiapine (SEROQUEL XR) 300 MG 24 hr tablet TAKE TWO TABLETS BY MOUTH EVERY NIGHT AT BEDTIME 01/04/21   Melony Overly T, PA-C  zinc gluconate 50 MG tablet Take 50 mg by mouth daily.    [provider]  buPROPion (WELLBUTRIN XL) 300 MG 24 hr tablet Take 1 tablet (300 mg total) by mouth daily. 04/03/20 04/03/20  Geoffery Lyons, MD    Allergies    Robaxin [methocarbamol] and Toradol [ketorolac tromethamine]  Review of Systems   Review of Systems  Constitutional:  Negative for activity change, appetite change, fatigue and fever.  HENT:  Positive for dental problem.   Respiratory:  Negative for cough, chest tightness and shortness of breath.   Cardiovascular:  Negative for chest pain.  Gastrointestinal:  Negative for abdominal pain, nausea and vomiting.  Genitourinary:  Negative for dysuria and hematuria.  Musculoskeletal:  Negative for myalgias.  Skin:  Negative for rash.  Neurological:  Negative for dizziness,  weakness and headaches.   all other systems are negative except as noted in the HPI and PMH.   Physical Exam Updated Vital Signs BP (!) 146/98 (BP Location: Right Arm)   Pulse (!) 110   Temp 98.6 F (37 C) (Oral)   Resp 18   Ht 5' (1.524 m)   Wt (!) 140.6 kg   SpO2 97%   BMI 60.54 kg/m   Physical Exam Vitals and nursing note reviewed.  Constitutional:      General: She is not in acute distress.  Appearance: She is well-developed. She is obese.  HENT:     Head: Normocephalic and atraumatic.     Mouth/Throat:     Pharynx: No oropharyngeal exudate.     Comments: Poor dentition throughout.  Left lower incisors with large caries and fractures to the gumline.  No fluctuance.  Floor mouth is soft. No trismus or malocclusion Eyes:     Conjunctiva/sclera: Conjunctivae normal.     Pupils: Pupils are equal, round, and reactive to light.  Neck:     Comments: No meningismus. Cardiovascular:     Rate and Rhythm: Normal rate and regular rhythm.     Heart sounds: Normal heart sounds. No murmur heard. Pulmonary:     Effort: Pulmonary effort is normal. No respiratory distress.     Breath sounds: Normal breath sounds.  Abdominal:     Palpations: Abdomen is soft.     Tenderness: There is no abdominal tenderness. There is no guarding or rebound.  Musculoskeletal:        General: No tenderness. Normal range of motion.     Cervical back: Normal range of motion and neck supple.  Skin:    General: Skin is warm.  Neurological:     Mental Status: She is alert and oriented to person, place, and time.     Cranial Nerves: No cranial nerve deficit.     Motor: No abnormal muscle tone.     Coordination: Coordination normal.     Comments:  5/5 strength throughout. CN 2-12 intact.Equal grip strength.   Psychiatric:        Behavior: Behavior normal.    ED Results / Procedures / Treatments   Labs (all labs ordered are listed, but only abnormal results are displayed) Labs Reviewed - No data to  display  EKG None  Radiology No results found.  Procedures Dental Block  Date/Time: 01/13/2021 6:56 AM Performed by: Glynn Octave, MD Authorized by: Glynn Octave, MD   Consent:    Consent obtained:  Verbal   Consent given by:  Patient   Risks, benefits, and alternatives were discussed: yes     Risks discussed:  Allergic reaction, hematoma, intravascular injection, infection, swelling, unsuccessful block, pain and nerve damage   Alternatives discussed:  No treatment Universal protocol:    Procedure explained and questions answered to patient or proxy's satisfaction: yes     Immediately prior to procedure, a time out was called: yes     Patient identity confirmed:  Verbally with patient Indications:    Indications: dental pain   Location:    Block type:  Inferior alveolar   Laterality:  Left Procedure details:    Syringe type:  Aspirating dental syringe   Needle gauge:  27 G   Anesthetic injected:  Bupivacaine 0.5% WITH epi   Injection procedure:  Anatomic landmarks identified, incremental injection, negative aspiration for blood, introduced needle and anatomic landmarks palpated Post-procedure details:    Outcome:  Pain relieved   Procedure completion:  Tolerated   Medications Ordered in ED Medications  ibuprofen (ADVIL) tablet 800 mg (has no administration in time range)  penicillin v potassium (VEETID) tablet 500 mg (has no administration in time range)    ED Course  I have reviewed the triage vital signs and the nursing notes.  Pertinent labs & imaging results that were available during my care of the patient were reviewed by me and considered in my medical decision making (see chart for details).    MDM Rules/Calculators/A&P  Dental pain without evidence of abscess or Ludwig's angina.   Will control pain with nonnarcotic pain medications, antibiotics and dental follow-up  Patient requested dental block as she has had good  relief with that in past.  Procedure delayed due to single physician coverage and high volume and acuity in department.   Block performed. Dental followup given.  Return precautions discussed.  Final Clinical Impression(s) / ED Diagnoses Final diagnoses:  Pain, dental    Rx / DC Orders ED Discharge Orders     None        Aloise Copus, Jeannett Senior, MD 01/13/21 [phone]

## 2021-01-16 ENCOUNTER — Encounter (HOSPITAL_BASED_OUTPATIENT_CLINIC_OR_DEPARTMENT_OTHER): Payer: Self-pay | Admitting: *Deleted

## 2021-01-16 ENCOUNTER — Other Ambulatory Visit: Payer: Self-pay

## 2021-01-16 ENCOUNTER — Emergency Department (HOSPITAL_BASED_OUTPATIENT_CLINIC_OR_DEPARTMENT_OTHER)
Admission: EM | Admit: 2021-01-16 | Discharge: 2021-01-16 | Disposition: A | Discharge status: Home / Self Care | Payer: 59 | Attending: Emergency Medicine | Admitting: Emergency Medicine

## 2021-01-16 DIAGNOSIS — Z87891 Personal history of nicotine dependence: Secondary | ICD-10-CM | POA: Insufficient documentation

## 2021-01-16 DIAGNOSIS — K029 Dental caries, unspecified: Secondary | ICD-10-CM | POA: Diagnosis not present

## 2021-01-16 DIAGNOSIS — K0889 Other specified disorders of teeth and supporting structures: Secondary | ICD-10-CM

## 2021-01-16 DIAGNOSIS — I10 Essential (primary) hypertension: Secondary | ICD-10-CM | POA: Diagnosis not present

## 2021-01-16 DIAGNOSIS — Z79899 Other long term (current) drug therapy: Secondary | ICD-10-CM | POA: Diagnosis not present

## 2021-01-16 MED ORDER — AMOXICILLIN-POT CLAVULANATE 875-125 MG PO TABS: 1.0000 | ORAL_TABLET | Freq: Two times a day (BID) | ORAL | 0 refills | Status: DC

## 2021-01-16 MED ORDER — BENZOCAINE 10 % MT GEL: 1.0000 "application " | OROMUCOSAL | 0 refills | Status: DC | PRN

## 2021-01-16 MED ORDER — OXYCODONE-ACETAMINOPHEN 5-325 MG PO TABS
1.0000 | ORAL_TABLET | Freq: Once | ORAL | Status: AC
  Administered 2021-01-16: 1 via ORAL
  Filled 2021-01-16: qty 1

## 2021-01-16 MED ORDER — AMOXICILLIN-POT CLAVULANATE 875-125 MG PO TABS
1.0000 | ORAL_TABLET | Freq: Once | ORAL | Status: AC
  Administered 2021-01-16: 1 via ORAL
  Filled 2021-01-16: qty 1

## 2021-01-16 NOTE — ED Triage Notes (Signed)
Dental pain. 

## 2021-01-16 NOTE — Discharge Instructions (Addendum)
You were seen for your dental pain. You have been prescribed an antibiotic to treat infection in your teeth. You have also been prescribed oragel, which is topical numbing medication which you may apply to the area.   Please return to the ER with any new severe symptoms.

## 2021-01-16 NOTE — ED Provider Notes (Signed)
With [facility] [place] EMERGENCY [facility] Provider Note   CSN: [id number] Arrival date & time: 01/16/21  1325     History Chief Complaint  Patient presents with   Dental Pain    Marilyn Fowler is a 45 y.o. female who presents with concern for dental pain.  Patient has 2 decaying teeth in the left anterior mandible, scheduled for extraction in a few weeks.  States that she was unable to pay for her antibiotic prescription she was given a week ago and would like a new prescription now as she has been paid today.  Additionally  patient requesting treatment for pain.  Denies any fevers, chills, nausea, vomiting, or diarrhea.  I personally read this patient's medical records.  She has history of hypertension, bipolar type II, anxiety and depression.  She is not anticoagulated.  She has been taking naproxen and Tylenol for pain with some improvement.  HPI     Past Medical History:  Diagnosis Date   Alcohol abuse    Anemia    Anemia    Anxiety    Blood transfusion without reported diagnosis    Depression    Hemorrhoid    Hypertension    Migraine     Patient Active Problem List   Diagnosis Date Noted   History of suicide attempt 05/29/2020   Benzodiazepine abuse (HCC) 05/29/2020   Insomnia 05/29/2020   Sprained ankle 01/14/2020   Bipolar depression (HCC) 01/13/2020   Physical exam 08/07/2017   Headache 06/30/2017   Chronic right shoulder pain 09/04/2016   Hypokalemia 04/30/2016   Hypomagnesemia 04/30/2016   Alcoholic hepatitis 04/30/2016   Alcohol induced fatty liver 04/30/2016   Alcohol abuse 04/29/2016   Left medial knee pain 06/29/2015   Acute bacterial sinusitis 10/11/2014   Maxillary sinusitis, acute 09/14/2014   Gastroenteritis 11/09/2013   Pleuritic chest pain 09/17/2013   Chest pain 11/18/2012   Morbid obesity (HCC) 09/29/2012   HTN (hypertension) 07/12/2012   Migraine 07/12/2012   Bipolar II disorder, most recent episode major depressive (HCC)  07/11/2012    Past Surgical History:  Procedure Laterality Date   HEMORRHOID SURGERY     HERNIA REPAIR     TONSILLECTOMY     VENTRAL HERNIA REPAIR       OB History   No obstetric history on file.     Family History  Problem Relation Age of Onset   Osteoarthritis Mother    Diabetes Mother    Hypertension Mother    Depression Mother    Hypertension Father    Diabetes Father    Asthma Son    Deep vein thrombosis Maternal Grandmother    Deep vein thrombosis Maternal Grandfather    Stroke Neg Hx     Social History   Tobacco Use   Smoking status: Former    Years: 10.00    Types: Cigarettes    Quit date: 04/12/2020    Years since quitting: 0.7   Smokeless tobacco: Never  Vaping Use   Vaping Use: Never used  Substance Use Topics   Alcohol use: Not Currently   Drug use: No    Home Medications Prior to Admission medications   Medication Sig Start Date End Date Taking? Authorizing Provider  amoxicillin-clavulanate (AUGMENTIN) 875-125 MG tablet Take 1 tablet by mouth every 12 (twelve) hours. 01/16/21  Yes Bradd Merlos R, PA-C  benzocaine (ORAJEL) 10 % mucosal gel Use as directed 1 application in the mouth or throat as needed for mouth pain. 01/16/21  Yes  Andera Cranmer R, PA-C  amLODipine (NORVASC) 2.5 MG tablet Take 1 tablet (2.5 mg total) by mouth daily. 01/19/20   Clapacs, Jackquline Denmark, MD  Ascorbic Acid (VITAMIN C PO) Take 1 tablet by mouth daily.    [provider]  Cholecalciferol (VITAMIN D3 PO) Take 1 tablet by mouth daily. Patient not taking: Reported on 09/19/2020    [provider]  dicyclomine (BENTYL) 20 MG tablet Take 1 tablet (20 mg total) by mouth 2 (two) times daily. 11/25/20   Koleen Distance, MD  diphenhydrAMINE (BENADRYL) 50 MG capsule Take 50 mg by mouth every 8 (eight) hours as needed.    [provider]  ferrous sulfate 325 (65 FE) MG tablet Take 1 tablet (325 mg total) by mouth 3 (three) times daily with meals. 11/25/20  12/25/20  Koleen Distance, MD  gabapentin (NEURONTIN) 400 MG capsule TAKE ONE CAPSULE BY MOUTH THREE TIMES A DAY 01/04/21   Hurst, Glade Nurse, PA-C  hydrocortisone (ANUSOL-HC) 25 MG suppository Place 1 suppository (25 mg total) rectally 2 (two) times daily. 11/25/20   Koleen Distance, MD  ibuprofen (ADVIL) 200 MG tablet Take 400 mg by mouth every 4 (four) hours as needed for headache (pain).    [provider]  metoprolol succinate (TOPROL-XL) 25 MG 24 hr tablet Take 1 tablet (25 mg total) by mouth daily. 01/19/20   Clapacs, Jackquline Denmark, MD  mirtazapine (REMERON) 30 MG tablet TAKE 1 TABLET BY MOUTH EVERYDAY AT BEDTIME 11/14/20   Melony Overly T, PA-C  Multiple Vitamin (MULTIVITAMIN) tablet Take 1 tablet by mouth daily.    [provider]  naproxen (NAPROSYN) 500 MG tablet Take 1 tablet (500 mg total) by mouth 2 (two) times daily as needed. 01/13/21   Rancour, Jeannett Senior, MD  ondansetron (ZOFRAN ODT) 4 MG disintegrating tablet Take 1 tablet (4 mg total) by mouth every 8 (eight) hours as needed for nausea or vomiting. 11/25/20   Koleen Distance, MD  propranolol (INDERAL) 20 MG tablet TAKE ONE TABLET BY MOUTH THREE TIMES A DAY AS NEEDED 11/24/20   Melony Overly T, PA-C  QUEtiapine (SEROQUEL XR) 300 MG 24 hr tablet TAKE TWO TABLETS BY MOUTH EVERY NIGHT AT BEDTIME 01/04/21   Melony Overly T, PA-C  zinc gluconate 50 MG tablet Take 50 mg by mouth daily.    [provider]  buPROPion (WELLBUTRIN XL) 300 MG 24 hr tablet Take 1 tablet (300 mg total) by mouth daily. 04/03/20 04/03/20  Geoffery Lyons, MD    Allergies    Robaxin [methocarbamol] and Toradol [ketorolac tromethamine]  Review of Systems   Review of Systems  Constitutional: Negative.   HENT:  Positive for dental problem. Negative for sneezing, sore throat, tinnitus, trouble swallowing and voice change.   Eyes: Negative.   Respiratory: Negative.    Cardiovascular: Negative.   Gastrointestinal: Negative.   Genitourinary: Negative.    Neurological: Negative.    Physical Exam Updated Vital Signs BP (!) 180/10 (BP Location: Right Arm)   Pulse 97   Temp 98.2 F (36.8 C) (Oral)   Resp 20   Ht 5' (1.524 m)   Wt (!) 140.6 kg   LMP 01/09/2021   SpO2 99%   BMI 60.54 kg/m   Physical Exam Vitals and nursing note reviewed.  HENT:     Head: Normocephalic and atraumatic.     Mouth/Throat:     Mouth: No oral lesions or angioedema.     Dentition: Abnormal dentition. Dental tenderness and  dental caries present.     Palate: No mass and lesions.     Pharynx: Oropharynx is clear. Uvula midline.     Tonsils: No tonsillar exudate.   Eyes:     General: No scleral icterus.       Right eye: No discharge.        Left eye: No discharge.     Conjunctiva/sclera: Conjunctivae normal.  Pulmonary:     Effort: Pulmonary effort is normal.  Skin:    General: Skin is warm and dry.  Neurological:     General: No focal deficit present.     Mental Status: She is alert.  Psychiatric:        Mood and Affect: Mood normal.    ED Results / Procedures / Treatments   Labs (all labs ordered are listed, but only abnormal results are displayed) Labs Reviewed - No data to display  EKG None  Radiology No results found.  Procedures Procedures   Medications Ordered in ED Medications  oxyCODONE-acetaminophen (PERCOCET/ROXICET) 5-325 MG per tablet 1 tablet (1 tablet Oral Given 01/16/21 1731)  amoxicillin-clavulanate (AUGMENTIN) 875-125 MG per tablet 1 tablet (1 tablet Oral Given 01/16/21 1731)    ED Course  I have reviewed the triage vital signs and the nursing notes.  Pertinent labs & imaging results that were available during my care of the patient were reviewed by me and considered in my medical decision making (see chart for details).    MDM Rules/Calculators/A&P                         45 year old female presents with dental pain, chronic, scheduled for extraction a few weeks.  Differential diagnose includes is limited to  periapical infection, dental carry, oropharyngeal abscess, Ludwick's angina.  Hypertensive and mildly tachycardic intake.  At time of my exam she is no longer tachycardic.  Cardiopulmonary exam is normal, dental exam revealed dental carry and periapical edema in the left anterior mandible without buccal mucosa edema or tenderness palpation.  No signs oropharyngeal abscess, no sublingual or submental tenderness palpation.  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  Will prescribe antibiotics in the outpatient setting as well as Orajel.  No further work-up warranted in ED this time.  [place] voiced understanding of her medical evaluation and treatment plan.  Requesting presents to express satisfaction.  Can precautions are given.  Patient is well-appearing, stable, and appropriate for discharge at this time.  This chart was dictated using voice recognition software, Dragon. Despite the best efforts of this provider to proofread and correct errors, errors may still occur which can change documentation meaning.   Final Clinical Impression(s) / ED Diagnoses Final diagnoses:  Pain, dental    Rx / DC Orders ED Discharge Orders          Ordered    amoxicillin-clavulanate (AUGMENTIN) 875-125 MG tablet  Every 12 hours        01/16/21 1748    benzocaine (ORAJEL) 10 % mucosal gel  As needed        01/16/21 1748             Keiasia Christianson, Eugene Gavia, PA-C 01/16/21 2023    Charlynne Pander, MD 01/18/21 [phone]

## 2021-01-29 ENCOUNTER — Telehealth: Payer: Self-pay | Admitting: Physician Assistant

## 2021-01-29 NOTE — Telephone Encounter (Signed)
Pt stated she tried increasing Seroquel and she still is not sleeping as well as she use to.She wants to try dayvigo

## 2021-01-29 NOTE — Telephone Encounter (Signed)
Pt informed.She is taking the gabapentin and wants to know how to take the 600 mg because she has capsules

## 2021-01-29 NOTE — Telephone Encounter (Signed)
Pt LM on VM reporting Seroquel not working as well as before. Mentioned trying Evekeo. Would like to try samples.Contact # [phone]. Last apt 09/19/20

## 2021-01-29 NOTE — Telephone Encounter (Signed)
Reminded her that she is not supposed to change doses of medications without discussing with me first.  I am not going to prescribe Dayvigo because it is a controlled substance.  She and I have discussed that in the past.  She is supposed to be taking gabapentin 400 mg 3 times daily.  Please confirm.  Let us increase the evening dose to a total of 600 mg.  She must make, and keep, an appointment with me before I will continue prescribing her medications.  Please let her know that.  Thank you.

## 2021-01-29 NOTE — Telephone Encounter (Signed)
We can send in an Rx for 600 mg, for her to take only at night.

## 2021-01-30 ENCOUNTER — Other Ambulatory Visit: Payer: Self-pay

## 2021-01-30 NOTE — Telephone Encounter (Signed)
LVM to call back with a pharmacy

## 2021-01-31 NOTE — Telephone Encounter (Signed)
ok 

## 2021-01-31 NOTE — Telephone Encounter (Signed)
I have been trying to call her to find out pharmacy and she won't answer

## 2021-02-02 ENCOUNTER — Other Ambulatory Visit: Payer: Self-pay | Admitting: Physician Assistant

## 2021-02-05 NOTE — Telephone Encounter (Signed)
Please schedule appt

## 2021-02-07 NOTE — Telephone Encounter (Signed)
Appt 10/18; she asked that enough meds be called in until her appt on the 18th.  Both scripts need to be sent to [facility].

## 2021-02-13 ENCOUNTER — Ambulatory Visit: Payer: 59 | Admitting: Physician Assistant

## 2021-03-06 ENCOUNTER — Other Ambulatory Visit: Payer: Self-pay | Admitting: Physician Assistant

## 2021-03-06 NOTE — Telephone Encounter (Signed)
Last seen 09/19/20

## 2021-03-18 ENCOUNTER — Other Ambulatory Visit: Payer: Self-pay | Admitting: Physician Assistant

## 2021-05-01 DIAGNOSIS — G5603 Carpal tunnel syndrome, bilateral upper limbs: Secondary | ICD-10-CM | POA: Insufficient documentation

## 2022-06-06 ENCOUNTER — Encounter (HOSPITAL_BASED_OUTPATIENT_CLINIC_OR_DEPARTMENT_OTHER): Payer: Self-pay

## 2022-06-06 ENCOUNTER — Other Ambulatory Visit: Payer: Self-pay

## 2022-06-06 ENCOUNTER — Emergency Department (HOSPITAL_BASED_OUTPATIENT_CLINIC_OR_DEPARTMENT_OTHER)
Admission: EM | Admit: 2022-06-06 | Discharge: 2022-06-07 | Disposition: A | Discharge status: Home / Self Care | Payer: No Typology Code available for payment source | Attending: Emergency Medicine | Admitting: Emergency Medicine

## 2022-06-06 DIAGNOSIS — Z79899 Other long term (current) drug therapy: Secondary | ICD-10-CM | POA: Insufficient documentation

## 2022-06-06 DIAGNOSIS — Z20822 Contact with and (suspected) exposure to covid-19: Secondary | ICD-10-CM | POA: Insufficient documentation

## 2022-06-06 DIAGNOSIS — R0602 Shortness of breath: Secondary | ICD-10-CM | POA: Diagnosis not present

## 2022-06-06 DIAGNOSIS — F17200 Nicotine dependence, unspecified, uncomplicated: Secondary | ICD-10-CM | POA: Insufficient documentation

## 2022-06-06 DIAGNOSIS — F19239 Other psychoactive substance dependence with withdrawal, unspecified: Secondary | ICD-10-CM | POA: Insufficient documentation

## 2022-06-06 DIAGNOSIS — I10 Essential (primary) hypertension: Secondary | ICD-10-CM | POA: Insufficient documentation

## 2022-06-06 DIAGNOSIS — R000 Tachycardia, unspecified: Secondary | ICD-10-CM | POA: Insufficient documentation

## 2022-06-06 DIAGNOSIS — E876 Hypokalemia: Secondary | ICD-10-CM

## 2022-06-06 DIAGNOSIS — F419 Anxiety disorder, unspecified: Secondary | ICD-10-CM | POA: Insufficient documentation

## 2022-06-06 DIAGNOSIS — F13930 Sedative, hypnotic or anxiolytic use, unspecified with withdrawal, uncomplicated: Secondary | ICD-10-CM

## 2022-06-06 DIAGNOSIS — J209 Acute bronchitis, unspecified: Secondary | ICD-10-CM

## 2022-06-06 DIAGNOSIS — R072 Precordial pain: Secondary | ICD-10-CM

## 2022-06-06 DIAGNOSIS — J189 Pneumonia, unspecified organism: Secondary | ICD-10-CM | POA: Diagnosis not present

## 2022-06-06 NOTE — ED Triage Notes (Signed)
Per pt's Mother pt has been sick all week with "a cold" Pt demonstrating tachypnea and can not speak in full sentences.   C/o SHOB and CP

## 2022-06-07 ENCOUNTER — Encounter (HOSPITAL_BASED_OUTPATIENT_CLINIC_OR_DEPARTMENT_OTHER): Payer: Self-pay | Admitting: Radiology

## 2022-06-07 ENCOUNTER — Emergency Department (HOSPITAL_BASED_OUTPATIENT_CLINIC_OR_DEPARTMENT_OTHER): Payer: No Typology Code available for payment source

## 2022-06-07 LAB — CBC WITH DIFFERENTIAL/PLATELET
Abs Immature Granulocytes: 0.06 10*3/uL (ref 0.00–0.07)
Basophils Absolute: 0.1 10*3/uL (ref 0.0–0.1)
Basophils Relative: 1 %
Eosinophils Absolute: 0.1 10*3/uL (ref 0.0–0.5)
Eosinophils Relative: 1 %
HCT: 45.2 % (ref 36.0–46.0)
Hemoglobin: 15.6 g/dL — ABNORMAL HIGH (ref 12.0–15.0)
Immature Granulocytes: 1 %
Lymphocytes Relative: 30 %
Lymphs Abs: 3.9 10*3/uL (ref 0.7–4.0)
MCH: 28.9 pg (ref 26.0–34.0)
MCHC: 34.5 g/dL (ref 30.0–36.0)
MCV: 83.9 fL (ref 80.0–100.0)
Monocytes Absolute: 1.5 10*3/uL — ABNORMAL HIGH (ref 0.1–1.0)
Monocytes Relative: 11 %
Neutro Abs: 7.7 10*3/uL (ref 1.7–7.7)
Neutrophils Relative %: 56 %
Platelets: 496 10*3/uL — ABNORMAL HIGH (ref 150–400)
RBC: 5.39 MIL/uL — ABNORMAL HIGH (ref 3.87–5.11)
RDW: 14.7 % (ref 11.5–15.5)
WBC: 13.3 10*3/uL — ABNORMAL HIGH (ref 4.0–10.5)
nRBC: 0 % (ref 0.0–0.2)

## 2022-06-07 LAB — COMPREHENSIVE METABOLIC PANEL
ALT: 22 U/L (ref 0–44)
AST: 40 U/L (ref 15–41)
Albumin: 4.7 g/dL (ref 3.5–5.0)
Alkaline Phosphatase: 90 U/L (ref 38–126)
Anion gap: 12 (ref 5–15)
BUN: 12 mg/dL (ref 6–20)
CO2: 17 mmol/L — ABNORMAL LOW (ref 22–32)
Calcium: 9.4 mg/dL (ref 8.9–10.3)
Chloride: 107 mmol/L (ref 98–111)
Creatinine, Ser: 0.98 mg/dL (ref 0.44–1.00)
GFR, Estimated: >60 mL/min (ref >60)
Glucose, Bld: 127 mg/dL — ABNORMAL HIGH (ref 70–99)
Potassium: 3.1 mmol/L — ABNORMAL LOW (ref 3.5–5.1)
Sodium: 136 mmol/L (ref 135–145)
Total Bilirubin: 0.8 mg/dL (ref 0.3–1.2)
Total Protein: 8.3 g/dL — ABNORMAL HIGH (ref 6.5–8.1)

## 2022-06-07 LAB — I-STAT VENOUS BLOOD GAS, ED
Acid-base deficit: 2 mmol/L (ref 0.0–2.0)
Bicarbonate: 16 mmol/L — ABNORMAL LOW (ref 20.0–28.0)
Calcium, Ion: 1.16 mmol/L (ref 1.15–1.40)
HCT: 47 % — ABNORMAL HIGH (ref 36.0–46.0)
Hemoglobin: 16 g/dL — ABNORMAL HIGH (ref 12.0–15.0)
O2 Saturation: 69 %
Patient temperature: 97.7
Potassium: 3.3 mmol/L — ABNORMAL LOW (ref 3.5–5.1)
Sodium: 142 mmol/L (ref 135–145)
TCO2: 16 mmol/L — ABNORMAL LOW (ref 22–32)
pCO2, Ven: 15.7 mmHg — CL (ref 44–60)
pH, Ven: 7.613 (ref 7.25–7.43)
pO2, Ven: 27 mmHg — CL (ref 32–45)

## 2022-06-07 LAB — RESP PANEL BY RT-PCR (RSV, FLU A&B, COVID)  RVPGX2
Influenza A by PCR: NEGATIVE
Influenza B by PCR: NEGATIVE
Resp Syncytial Virus by PCR: NEGATIVE
SARS Coronavirus 2 by RT PCR: NEGATIVE

## 2022-06-07 LAB — LACTIC ACID, PLASMA
Lactic Acid, Venous: 2.1 mmol/L (ref 0.5–1.9)
Lactic Acid, Venous: 3.3 mmol/L (ref 0.5–1.9)

## 2022-06-07 LAB — BRAIN NATRIURETIC PEPTIDE: B Natriuretic Peptide: 18.5 pg/mL (ref 0.0–100.0)

## 2022-06-07 LAB — TROPONIN I (HIGH SENSITIVITY)
Troponin I (High Sensitivity): 2 ng/L (ref <18)
Troponin I (High Sensitivity): 3 ng/L (ref <18)

## 2022-06-07 MED ORDER — POTASSIUM CHLORIDE CRYS ER 20 MEQ PO TBCR
40.0000 meq | EXTENDED_RELEASE_TABLET | Freq: Once | ORAL | Status: AC
  Administered 2022-06-07: 40 meq via ORAL
  Filled 2022-06-07: qty 2

## 2022-06-07 MED ORDER — GUAIFENESIN 100 MG/5ML PO LIQD
5.0000 mL | Freq: Once | ORAL | Status: AC
  Administered 2022-06-07: 5 mL via ORAL
  Filled 2022-06-07: qty 10

## 2022-06-07 MED ORDER — CLONAZEPAM 2 MG PO TABS: 2.0000 mg | ORAL_TABLET | Freq: Once | ORAL | Status: DC

## 2022-06-07 MED ORDER — IPRATROPIUM-ALBUTEROL 0.5-2.5 (3) MG/3ML IN SOLN
3.0000 mL | Freq: Once | RESPIRATORY_TRACT | Status: AC
  Administered 2022-06-07: 3 mL via RESPIRATORY_TRACT
  Filled 2022-06-07: qty 3

## 2022-06-07 MED ORDER — LORAZEPAM 2 MG/ML IJ SOLN
0.5000 mg | Freq: Once | INTRAMUSCULAR | Status: AC
  Administered 2022-06-07: 0.5 mg via INTRAVENOUS
  Filled 2022-06-07: qty 1

## 2022-06-07 MED ORDER — CLONAZEPAM 1 MG PO TABS: 1.0000 mg | ORAL_TABLET | Freq: Once | ORAL | Status: DC

## 2022-06-07 MED ORDER — IOHEXOL 350 MG/ML SOLN
100.0000 mL | Freq: Once | INTRAVENOUS | Status: AC | PRN
  Administered 2022-06-07: 125 mL via INTRAVENOUS

## 2022-06-07 MED ORDER — AZITHROMYCIN 250 MG PO TABS
500.0000 mg | ORAL_TABLET | Freq: Once | ORAL | Status: AC
  Administered 2022-06-07: 500 mg via ORAL
  Filled 2022-06-07: qty 2

## 2022-06-07 MED ORDER — AZITHROMYCIN 250 MG PO TABS: 250.0000 mg | ORAL_TABLET | Freq: Every day | ORAL | 0 refills | Status: DC

## 2022-06-07 MED ORDER — LORAZEPAM 2 MG/ML IJ SOLN
1.0000 mg | Freq: Once | INTRAMUSCULAR | Status: AC
  Administered 2022-06-07: 1 mg via INTRAVENOUS

## 2022-06-07 MED ORDER — SODIUM CHLORIDE 0.9 % IV BOLUS
1000.0000 mL | Freq: Once | INTRAVENOUS | Status: AC
  Administered 2022-06-07: 1000 mL via INTRAVENOUS

## 2022-06-07 MED ORDER — CHLORDIAZEPOXIDE HCL 25 MG PO CAPS
50.0000 mg | ORAL_CAPSULE | Freq: Once | ORAL | Status: AC
  Administered 2022-06-07: 50 mg via ORAL
  Filled 2022-06-07: qty 2

## 2022-06-07 NOTE — ED Provider Notes (Signed)
[place] [facility] Provider Note   CSN: [id number] Arrival date & time: 06/06/22  2339     History  Chief Complaint  Patient presents with   Shortness of Breath   Chest Pain    Marilyn Fowler is a 47 y.o. female.  The history is provided by the patient, medical records and a parent.  Shortness of Breath Associated symptoms: chest pain   Chest Pain Associated symptoms: shortness of breath   Marilyn Fowler is a 47 y.o. female who presents to the Emergency Department complaining of shortness of breath.  She presents to the emergency department accompanied by her mother for evaluation of shortness of breath.  Level 5 caveat due to respiratory distress.  The bulk of the history is obtained by the patient's mother.  She has a history of hypertension, anxiety.  She became sick about 1 week ago with upper respiratory symptoms including cough.  She did have a low-grade temperature to 100.6 the first 2 days.  That this fever then resolved.  Over the last 2 days she has developed central chest pressure and worsening difficulty breathing.  No nausea, vomiting, abdominal pain, leg swelling or pain.  She does have poor oral intake for the last few days and poor appetite.  She does use tobacco.  No alcohol or drug use.  No history of lung disease.     Home Medications Prior to Admission medications   Medication Sig Start Date End Date Taking? Authorizing Provider  azithromycin (ZITHROMAX) 250 MG tablet Take 1 tablet (250 mg total) by mouth daily. Take first 2 tablets together, then 1 every day until finished. 06/07/22  Yes Quintella Reichert, MD  amLODipine (NORVASC) 2.5 MG tablet Take 1 tablet (2.5 mg total) by mouth daily. 01/19/20   Clapacs, Madie Reno, MD  amoxicillin-clavulanate (AUGMENTIN) 875-125 MG tablet Take 1 tablet by mouth every 12 (twelve) hours. 01/16/21   Sponseller, Gypsy Balsam, PA-C  Ascorbic Acid (VITAMIN C PO) Take 1 tablet by mouth daily.    [provider]  benzocaine (ORAJEL) 10 % mucosal gel Use as directed 1 application in the mouth or throat as needed for mouth pain. 01/16/21   Sponseller, Gypsy Balsam, PA-C  Cholecalciferol (VITAMIN D3 PO) Take 1 tablet by mouth daily. Patient not taking: Reported on 09/19/2020    [provider]  dicyclomine (BENTYL) 20 MG tablet Take 1 tablet (20 mg total) by mouth 2 (two) times daily. 11/25/20   Arnaldo Natal, MD  diphenhydrAMINE (BENADRYL) 50 MG capsule Take 50 mg by mouth every 8 (eight) hours as needed.    [provider]  ferrous sulfate 325 (65 FE) MG tablet Take 1 tablet (325 mg total) by mouth 3 (three) times daily with meals. 11/25/20 12/25/20  Arnaldo Natal, MD  gabapentin (NEURONTIN) 400 MG capsule TAKE ONE CAPSULE BY MOUTH THREE TIMES A DAY 01/04/21   Hurst, Dorothea Glassman, PA-C  hydrocortisone (ANUSOL-HC) 25 MG suppository Place 1 suppository (25 mg total) rectally 2 (two) times daily. 11/25/20   Arnaldo Natal, MD  ibuprofen (ADVIL) 200 MG tablet Take 400 mg by mouth every 4 (four) hours as needed for headache (pain).    [provider]  metoprolol succinate (TOPROL-XL) 25 MG 24 hr tablet Take 1 tablet (25 mg total) by mouth daily. 01/19/20   Clapacs, Madie Reno, MD  mirtazapine (REMERON) 30 MG tablet TAKE 1 TABLET BY MOUTH EVERYDAY AT BEDTIME 11/14/20   Addison Lank,  PA-C  Multiple Vitamin (MULTIVITAMIN) tablet Take 1 tablet by mouth daily.    [provider]  naproxen (NAPROSYN) 500 MG tablet Take 1 tablet (500 mg total) by mouth 2 (two) times daily as needed. 01/13/21   Rancour, Annie Main, MD  ondansetron (ZOFRAN ODT) 4 MG disintegrating tablet Take 1 tablet (4 mg total) by mouth every 8 (eight) hours as needed for nausea or vomiting. 11/25/20   Arnaldo Natal, MD  propranolol (INDERAL) 20 MG tablet TAKE ONE TABLET BY MOUTH THREE TIMES A DAY AS NEEDED 11/24/20   Donnal Moat T, PA-C  QUEtiapine (SEROQUEL XR) 300 MG 24 hr tablet TAKE TWO TABLETS BY MOUTH EVERY NIGHT  AT BEDTIME 02/08/21   Donnal Moat T, PA-C  zinc gluconate 50 MG tablet Take 50 mg by mouth daily.    [provider]  buPROPion (WELLBUTRIN XL) 300 MG 24 hr tablet Take 1 tablet (300 mg total) by mouth daily. 04/03/20 04/03/20  Veryl Speak, MD      Allergies    Robaxin [methocarbamol] and Toradol [ketorolac tromethamine]    Review of Systems   Review of Systems  Respiratory:  Positive for shortness of breath.   Cardiovascular:  Positive for chest pain.  All other systems reviewed and are negative.   Physical Exam Updated Vital Signs BP (!) 128/99 (BP Location: Right Arm)   Pulse (!) 109   Temp 98.2 F (36.8 C) (Oral)   Resp 14   Ht 5' (1.524 m)   Wt 117.5 kg   LMP 05/22/2022 (Approximate)   SpO2 100%   BMI 50.58 kg/m  Physical Exam Vitals and nursing note reviewed.  Constitutional:      General: She is in acute distress.     Appearance: She is well-developed. She is ill-appearing.  HENT:     Head: Normocephalic and atraumatic.  Cardiovascular:     Rate and Rhythm: Regular rhythm. Tachycardia present.     Heart sounds: No murmur heard. Pulmonary:     Effort: Respiratory distress present.     Comments: Frequent transmitted upper airway noises.  Tachypnea. Abdominal:     Palpations: Abdomen is soft.     Tenderness: There is no abdominal tenderness. There is no guarding or rebound.  Musculoskeletal:        General: No swelling or tenderness.  Skin:    General: Skin is warm and dry.     Capillary Refill: Capillary refill takes more than 3 seconds.  Neurological:     Mental Status: She is alert and oriented to person, place, and time.  Psychiatric:     Comments: anxious     ED Results / Procedures / Treatments   Labs (all labs ordered are listed, but only abnormal results are displayed) Labs Reviewed  COMPREHENSIVE METABOLIC PANEL - Abnormal; Notable for the following components:      Result Value   Potassium 3.1 (*)    CO2 17 (*)    Glucose, Bld  127 (*)    Total Protein 8.3 (*)    All other components within normal limits  CBC WITH DIFFERENTIAL/PLATELET - Abnormal; Notable for the following components:   WBC 13.3 (*)    RBC 5.39 (*)    Hemoglobin 15.6 (*)    Platelets 496 (*)    Monocytes Absolute 1.5 (*)    All other components within normal limits  LACTIC ACID, PLASMA - Abnormal; Notable for the following components:   Lactic Acid, Venous 3.3 (*)    All  other components within normal limits  LACTIC ACID, PLASMA - Abnormal; Notable for the following components:   Lactic Acid, Venous 2.1 (*)    All other components within normal limits  I-STAT VENOUS BLOOD GAS, ED - Abnormal; Notable for the following components:   pH, Ven 7.613 (*)    pCO2, Ven 15.7 (*)    pO2, Ven 27 (*)    Bicarbonate 16.0 (*)    TCO2 16 (*)    Potassium 3.3 (*)    HCT 47.0 (*)    Hemoglobin 16.0 (*)    All other components within normal limits  RESP PANEL BY RT-PCR (RSV, FLU A&B, COVID)  RVPGX2  CULTURE, BLOOD (ROUTINE X 2)  CULTURE, BLOOD (ROUTINE X 2)  BRAIN NATRIURETIC PEPTIDE  URINALYSIS, ROUTINE W REFLEX MICROSCOPIC  TROPONIN I (HIGH SENSITIVITY)  TROPONIN I (HIGH SENSITIVITY)    EKG EKG Interpretation  Date/Time:  Friday June 07 2022 02:19:39 EST Ventricular Rate:  109 PR Interval:  125 QRS Duration: 80 QT Interval:  362 QTC Calculation: 488 R Axis:   -34 Text Interpretation: Sinus tachycardia Inferior infarct, old Consider anterior infarct Confirmed by Quintella Reichert [phone]) on 06/07/2022 2:31:40 AM  Radiology CT Angio Chest PE W/Cm &/Or Wo Cm  Result Date: 06/07/2022 CLINICAL DATA:  Per pt's Mother pt has been sick all week with "a cold" Pt demonstrating tachypnea and can not speak in full sentences. C/o SHOB and CP EXAM: CT ANGIOGRAPHY CHEST WITH CONTRAST TECHNIQUE: Multidetector CT imaging of the chest was performed using the standard protocol during bolus administration of intravenous contrast. Multiplanar CT image  reconstructions and MIPs were obtained to evaluate the vascular anatomy. RADIATION DOSE REDUCTION: This exam was performed according to the departmental dose-optimization program which includes automated exposure control, adjustment of the mA and/or kV according to patient size and/or use of iterative reconstruction technique. CONTRAST:  171m OMNIPAQUE IOHEXOL 350 MG/ML SOLN COMPARISON:  Chest x-ray 06/07/2022 FINDINGS: Cardiovascular: Satisfactory opacification of the pulmonary arteries to the segmental level. No evidence of pulmonary embolism. Normal heart size. No significant pericardial effusion. The thoracic aorta is normal in caliber. No atherosclerotic plaque of the thoracic aorta. No coronary artery calcifications. Mediastinum/Nodes: No enlarged mediastinal, hilar, or axillary lymph nodes. Thyroid gland, trachea, and esophagus demonstrate no significant findings. Lungs/Pleura: No focal consolidation. No pulmonary nodule. No pulmonary mass. No pleural effusion. No pneumothorax. Upper Abdomen: No acute abnormality. Musculoskeletal: No chest wall abnormality. No suspicious lytic or blastic osseous lesions. No acute displaced fracture. Review of the MIP images confirms the above findings. IMPRESSION: 1. No pulmonary embolus. 2. No acute intrathoracic abnormality. Electronically Signed   By: MIven FinnM.D.   On: 06/07/2022 01:06   DG Chest Port 1 View  Result Date: 06/07/2022 CLINICAL DATA:  Shortness of breath, chest pain EXAM: PORTABLE CHEST 1 VIEW COMPARISON:  11/25/2020 FINDINGS: Heart and mediastinal contours are within normal limits. No focal opacities or effusions. No acute bony abnormality. IMPRESSION: No active disease. Electronically Signed   By: KRolm BaptiseM.D.   On: 06/07/2022 00:25    Procedures .Critical Care  Performed by: RQuintella Reichert MD Authorized by: RQuintella Reichert MD   Critical care provider statement:    Critical care time (minutes):  40   Critical care was necessary  to treat or prevent imminent or life-threatening deterioration of the following conditions:  Respiratory failure and dehydration   Critical care was time spent personally by me on the following activities:  Development of treatment plan  with patient or surrogate, evaluation of patient's response to treatment, examination of patient, review of old charts, pulse oximetry, ordering and review of radiographic studies and ordering and review of laboratory studies     Medications Ordered in ED Medications  potassium chloride SA (KLOR-CON M) CR tablet 40 mEq (has no administration in time range)  clonazePAM (KLONOPIN) tablet 2 mg (has no administration in time range)  chlordiazePOXIDE (LIBRIUM) capsule 50 mg (has no administration in time range)  sodium chloride 0.9 % bolus 1,000 mL ( Intravenous Stopped 06/07/22 0159)  LORazepam (ATIVAN) injection 0.5 mg (0.5 mg Intravenous Given 06/07/22 0033)  iohexol (OMNIPAQUE) 350 MG/ML injection 100 mL (125 mLs Intravenous Contrast Given 06/07/22 0038)  guaiFENesin (ROBITUSSIN) 100 MG/5ML liquid 5 mL (5 mLs Oral Given 06/07/22 0108)  LORazepam (ATIVAN) injection 1 mg (1 mg Intravenous Given 06/07/22 0106)  azithromycin (ZITHROMAX) tablet 500 mg (500 mg Oral Given 06/07/22 0142)  ipratropium-albuterol (DUONEB) 0.5-2.5 (3) MG/3ML nebulizer solution 3 mL (3 mLs Nebulization Given 06/07/22 0124)    ED Course/ Medical Decision Making/ A&P                             Medical Decision Making Amount and/or Complexity of Data Reviewed Labs: ordered. Radiology: ordered.  Risk OTC drugs. Prescription drug management.   Patient with history of hypertension, alcohol abuse, benzodiazepine abuse and bipolar disorder here for evaluation of chest pain and difficulty breathing.  Patient in distress at time of ED presentation.  VBG consistent with respiratory alkalosis.  She was treated with low-dose lorazepam with partial improvement followed by additional IV lorazepam with  additional significant improvement in her symptoms.  Initial lactic acid was elevated and she was treated with IV fluids with improvement in her lactic acid.  There was improvement in her skin perfusion on reassessment.  Labs with hemoconcentration, leukocytosis, hypokalemia.  Chest x-ray is negative for acute infiltrate-images personally reviewed and interpreted, agree with radiologist interpretation.  Given her significant chest pain a CTA PE study was obtained, which is negative for PE or pneumonia.  She was treated with azithromycin for potential bronchitis.  Troponins are negative x 2, current clinical picture is not consistent with ACS.  Current clinical picture is not consistent with sepsis.  Suspect initial elevated lactic acid was secondary to hyperventilation and dehydration.  On repeat assessment patient stopped taking her clonazepam 3 times daily 3 days ago because she did not feel well and did not want to continue taking the medication.  Her last dose of her phentermine was 2 days ago.  Suspect that there is some element of benzodiazepine withdrawal involved as well.  She denies alcohol or drug use.  She reports that she does have access to this medication with planned PCP follow-up for later today.  Will start with a dose of Librium here in the emergency department to bridge her as clonazepam is not available in this department.  Discussed with patient and mother option for observation in the hospital and patient does not want to be admitted to the hospital.  Discussed close return precautions if she develops progressive or new concerning symptoms.  Also discussed with patient and mother that she should discontinue her phentermine use for now.          Final Clinical Impression(s) / ED Diagnoses Final diagnoses:  Precordial pain  Acute bronchitis, unspecified organism  Benzodiazepine withdrawal without complication ([place])  Hypokalemia    Rx /  DC Orders ED Discharge Orders           Ordered    azithromycin (ZITHROMAX) 250 MG tablet  Daily        06/07/22 0352              Quintella Reichert, MD 06/07/22 [phone]

## 2022-06-07 NOTE — Discharge Instructions (Addendum)
Please follow-up with your family doctor later today for repeat evaluation.  Get rechecked immediately if you develop new symptoms or worsening symptoms.

## 2022-06-07 NOTE — Progress Notes (Signed)
Removed patient from nasal and administered and nebulizer on medical air (21%).  Patient tolerated well and her SPO2 is 100%.  RT will continue to monitor.

## 2022-06-07 NOTE — ED Notes (Signed)
Unable to get second set of cultures. Provider aware. No new orders at this time.

## 2022-06-09 ENCOUNTER — Inpatient Hospital Stay (HOSPITAL_COMMUNITY)
Admission: EM | Admit: 2022-06-09 | Discharge: 2022-06-11 | DRG: 194 | Disposition: A | Discharge status: Home / Self Care | Payer: No Typology Code available for payment source | Attending: Internal Medicine | Admitting: Internal Medicine

## 2022-06-09 ENCOUNTER — Telehealth (HOSPITAL_BASED_OUTPATIENT_CLINIC_OR_DEPARTMENT_OTHER): Payer: Self-pay | Admitting: Emergency Medicine

## 2022-06-09 ENCOUNTER — Other Ambulatory Visit: Payer: Self-pay

## 2022-06-09 ENCOUNTER — Emergency Department (HOSPITAL_COMMUNITY): Payer: No Typology Code available for payment source

## 2022-06-09 ENCOUNTER — Encounter (HOSPITAL_COMMUNITY): Payer: Self-pay | Admitting: Internal Medicine

## 2022-06-09 DIAGNOSIS — R0602 Shortness of breath: Secondary | ICD-10-CM | POA: Diagnosis present

## 2022-06-09 DIAGNOSIS — Z79899 Other long term (current) drug therapy: Secondary | ICD-10-CM | POA: Diagnosis not present

## 2022-06-09 DIAGNOSIS — F419 Anxiety disorder, unspecified: Secondary | ICD-10-CM | POA: Diagnosis present

## 2022-06-09 DIAGNOSIS — Z8616 Personal history of COVID-19: Secondary | ICD-10-CM | POA: Diagnosis not present

## 2022-06-09 DIAGNOSIS — I10 Essential (primary) hypertension: Secondary | ICD-10-CM | POA: Diagnosis present

## 2022-06-09 DIAGNOSIS — K760 Fatty (change of) liver, not elsewhere classified: Secondary | ICD-10-CM | POA: Diagnosis present

## 2022-06-09 DIAGNOSIS — F1010 Alcohol abuse, uncomplicated: Secondary | ICD-10-CM | POA: Diagnosis present

## 2022-06-09 DIAGNOSIS — E876 Hypokalemia: Secondary | ICD-10-CM | POA: Diagnosis present

## 2022-06-09 DIAGNOSIS — B955 Unspecified streptococcus as the cause of diseases classified elsewhere: Secondary | ICD-10-CM | POA: Diagnosis not present

## 2022-06-09 DIAGNOSIS — R7881 Bacteremia: Secondary | ICD-10-CM | POA: Diagnosis present

## 2022-06-09 DIAGNOSIS — Z825 Family history of asthma and other chronic lower respiratory diseases: Secondary | ICD-10-CM

## 2022-06-09 DIAGNOSIS — Z888 Allergy status to other drugs, medicaments and biological substances status: Secondary | ICD-10-CM

## 2022-06-09 DIAGNOSIS — Z818 Family history of other mental and behavioral disorders: Secondary | ICD-10-CM | POA: Diagnosis not present

## 2022-06-09 DIAGNOSIS — Z833 Family history of diabetes mellitus: Secondary | ICD-10-CM | POA: Diagnosis not present

## 2022-06-09 DIAGNOSIS — F17210 Nicotine dependence, cigarettes, uncomplicated: Secondary | ICD-10-CM | POA: Diagnosis present

## 2022-06-09 DIAGNOSIS — Z8249 Family history of ischemic heart disease and other diseases of the circulatory system: Secondary | ICD-10-CM | POA: Diagnosis not present

## 2022-06-09 DIAGNOSIS — J45909 Unspecified asthma, uncomplicated: Secondary | ICD-10-CM | POA: Insufficient documentation

## 2022-06-09 DIAGNOSIS — J209 Acute bronchitis, unspecified: Secondary | ICD-10-CM | POA: Insufficient documentation

## 2022-06-09 DIAGNOSIS — Z885 Allergy status to narcotic agent status: Secondary | ICD-10-CM

## 2022-06-09 DIAGNOSIS — Z6841 Body Mass Index (BMI) 40.0 and over, adult: Secondary | ICD-10-CM | POA: Diagnosis not present

## 2022-06-09 DIAGNOSIS — Z720 Tobacco use: Secondary | ICD-10-CM | POA: Diagnosis present

## 2022-06-09 DIAGNOSIS — F3181 Bipolar II disorder: Secondary | ICD-10-CM | POA: Diagnosis present

## 2022-06-09 DIAGNOSIS — J189 Pneumonia, unspecified organism: Principal | ICD-10-CM | POA: Diagnosis present

## 2022-06-09 DIAGNOSIS — E66813 Obesity, class 3: Secondary | ICD-10-CM

## 2022-06-09 DIAGNOSIS — F13239 Sedative, hypnotic or anxiolytic dependence with withdrawal, unspecified: Secondary | ICD-10-CM | POA: Diagnosis present

## 2022-06-09 DIAGNOSIS — Z8261 Family history of arthritis: Secondary | ICD-10-CM | POA: Diagnosis not present

## 2022-06-09 DIAGNOSIS — J45901 Unspecified asthma with (acute) exacerbation: Secondary | ICD-10-CM | POA: Diagnosis present

## 2022-06-09 LAB — BLOOD CULTURE ID PANEL (REFLEXED) - BCID2

## 2022-06-09 LAB — COMPREHENSIVE METABOLIC PANEL
ALT: 25 U/L (ref 0–44)
AST: 38 U/L (ref 15–41)
Albumin: 4.3 g/dL (ref 3.5–5.0)
Alkaline Phosphatase: 85 U/L (ref 38–126)
Anion gap: 7 (ref 5–15)
BUN: 8 mg/dL (ref 6–20)
CO2: 19 mmol/L — ABNORMAL LOW (ref 22–32)
Calcium: 8.9 mg/dL (ref 8.9–10.3)
Chloride: 112 mmol/L — ABNORMAL HIGH (ref 98–111)
Creatinine, Ser: 0.74 mg/dL (ref 0.44–1.00)
GFR, Estimated: >60 mL/min (ref >60)
Glucose, Bld: 87 mg/dL (ref 70–99)
Potassium: 3.4 mmol/L — ABNORMAL LOW (ref 3.5–5.1)
Sodium: 138 mmol/L (ref 135–145)
Total Bilirubin: 0.5 mg/dL (ref 0.3–1.2)
Total Protein: 7.6 g/dL (ref 6.5–8.1)

## 2022-06-09 LAB — CBC
HCT: 43.3 % (ref 36.0–46.0)
Hemoglobin: 14.2 g/dL (ref 12.0–15.0)
MCH: 29.3 pg (ref 26.0–34.0)
MCHC: 32.8 g/dL (ref 30.0–36.0)
MCV: 89.3 fL (ref 80.0–100.0)
Platelets: 335 10*3/uL (ref 150–400)
RBC: 4.85 MIL/uL (ref 3.87–5.11)
RDW: 14.1 % (ref 11.5–15.5)
WBC: 7.6 10*3/uL (ref 4.0–10.5)
nRBC: 0 % (ref 0.0–0.2)

## 2022-06-09 LAB — MAGNESIUM: Magnesium: 1.8 mg/dL (ref 1.7–2.4)

## 2022-06-09 LAB — PHOSPHORUS: Phosphorus: 3.4 mg/dL (ref 2.5–4.6)

## 2022-06-09 MED ORDER — SODIUM CHLORIDE 0.9 % IV BOLUS
1000.0000 mL | Freq: Once | INTRAVENOUS | Status: AC
  Administered 2022-06-09: 1000 mL via INTRAVENOUS

## 2022-06-09 MED ORDER — ENOXAPARIN SODIUM 40 MG/0.4ML IJ SOSY
40.0000 mg | PREFILLED_SYRINGE | INTRAMUSCULAR | Status: DC
  Administered 2022-06-09 – 2022-06-10 (×2): 40 mg via SUBCUTANEOUS
  Filled 2022-06-09 (×2): qty 0.4

## 2022-06-09 MED ORDER — METHYLPREDNISOLONE SODIUM SUCC 40 MG IJ SOLR
40.0000 mg | Freq: Once | INTRAMUSCULAR | Status: AC
  Administered 2022-06-09: 40 mg via INTRAVENOUS
  Filled 2022-06-09: qty 1

## 2022-06-09 MED ORDER — CLONAZEPAM 1 MG PO TABS
1.0000 mg | ORAL_TABLET | Freq: Three times a day (TID) | ORAL | Status: DC | PRN
  Administered 2022-06-09 – 2022-06-11 (×5): 1 mg via ORAL
  Filled 2022-06-09 (×5): qty 1

## 2022-06-09 MED ORDER — POTASSIUM CHLORIDE CRYS ER 20 MEQ PO TBCR
40.0000 meq | EXTENDED_RELEASE_TABLET | Freq: Once | ORAL | Status: AC
  Administered 2022-06-09: 40 meq via ORAL
  Filled 2022-06-09: qty 2

## 2022-06-09 MED ORDER — CITALOPRAM HYDROBROMIDE 20 MG PO TABS
20.0000 mg | ORAL_TABLET | Freq: Every day | ORAL | Status: DC
  Administered 2022-06-10 – 2022-06-11 (×2): 20 mg via ORAL
  Filled 2022-06-09 (×2): qty 1

## 2022-06-09 MED ORDER — SODIUM CHLORIDE 0.9 % IV SOLN
2.0000 g | INTRAVENOUS | Status: DC
  Administered 2022-06-10 – 2022-06-11 (×2): 2 g via INTRAVENOUS
  Filled 2022-06-09 (×2): qty 20

## 2022-06-09 MED ORDER — ONDANSETRON HCL 4 MG/2ML IJ SOLN
4.0000 mg | Freq: Four times a day (QID) | INTRAMUSCULAR | Status: DC | PRN
  Filled 2022-06-09: qty 2

## 2022-06-09 MED ORDER — ONDANSETRON HCL 4 MG PO TABS
4.0000 mg | ORAL_TABLET | Freq: Four times a day (QID) | ORAL | Status: DC | PRN
  Administered 2022-06-11: 4 mg via ORAL
  Filled 2022-06-09: qty 1

## 2022-06-09 MED ORDER — ONDANSETRON HCL 4 MG/2ML IJ SOLN
4.0000 mg | Freq: Once | INTRAMUSCULAR | Status: AC
  Administered 2022-06-09: 4 mg via INTRAVENOUS
  Filled 2022-06-09: qty 2

## 2022-06-09 MED ORDER — ACETAMINOPHEN 650 MG RE SUPP: 650.0000 mg | Freq: Four times a day (QID) | RECTAL | Status: DC | PRN

## 2022-06-09 MED ORDER — BUDESONIDE 0.5 MG/2ML IN SUSP
2.0000 mg | Freq: Two times a day (BID) | RESPIRATORY_TRACT | Status: DC
  Administered 2022-06-09 – 2022-06-10 (×2): 2 mg via RESPIRATORY_TRACT
  Filled 2022-06-09 (×3): qty 8

## 2022-06-09 MED ORDER — IPRATROPIUM-ALBUTEROL 0.5-2.5 (3) MG/3ML IN SOLN
3.0000 mL | Freq: Three times a day (TID) | RESPIRATORY_TRACT | Status: DC
  Administered 2022-06-09 – 2022-06-11 (×4): 3 mL via RESPIRATORY_TRACT
  Filled 2022-06-09 (×6): qty 3

## 2022-06-09 MED ORDER — CYCLOBENZAPRINE HCL 10 MG PO TABS
10.0000 mg | ORAL_TABLET | Freq: Three times a day (TID) | ORAL | Status: DC | PRN
  Administered 2022-06-10 – 2022-06-11 (×3): 10 mg via ORAL
  Filled 2022-06-09 (×3): qty 1

## 2022-06-09 MED ORDER — ACETAMINOPHEN 325 MG PO TABS: 650.0000 mg | ORAL_TABLET | Freq: Four times a day (QID) | ORAL | Status: DC | PRN

## 2022-06-09 MED ORDER — OXYCODONE-ACETAMINOPHEN 7.5-325 MG PO TABS
1.0000 | ORAL_TABLET | Freq: Three times a day (TID) | ORAL | Status: DC | PRN
  Administered 2022-06-09 – 2022-06-11 (×5): 1 via ORAL
  Filled 2022-06-09 (×5): qty 1

## 2022-06-09 MED ORDER — GUAIFENESIN ER 600 MG PO TB12
600.0000 mg | ORAL_TABLET | Freq: Two times a day (BID) | ORAL | Status: DC
  Administered 2022-06-09 – 2022-06-11 (×4): 600 mg via ORAL
  Filled 2022-06-09 (×4): qty 1

## 2022-06-09 MED ORDER — ACETAMINOPHEN 500 MG PO TABS
1000.0000 mg | ORAL_TABLET | Freq: Once | ORAL | Status: AC
  Administered 2022-06-09: 1000 mg via ORAL
  Filled 2022-06-09: qty 2

## 2022-06-09 MED ORDER — AZITHROMYCIN 250 MG PO TABS
250.0000 mg | ORAL_TABLET | Freq: Every day | ORAL | Status: DC
  Administered 2022-06-10: 250 mg via ORAL
  Filled 2022-06-09: qty 1

## 2022-06-09 MED ORDER — SODIUM CHLORIDE 0.9 % IV SOLN
2.0000 g | Freq: Once | INTRAVENOUS | Status: AC
  Administered 2022-06-09: 2 g via INTRAVENOUS
  Filled 2022-06-09: qty 20

## 2022-06-09 MED ORDER — MAGNESIUM SULFATE 2 GM/50ML IV SOLN
2.0000 g | Freq: Once | INTRAVENOUS | Status: AC
  Administered 2022-06-09: 2 g via INTRAVENOUS
  Filled 2022-06-09: qty 50

## 2022-06-09 MED ORDER — PANTOPRAZOLE SODIUM 40 MG PO TBEC
40.0000 mg | DELAYED_RELEASE_TABLET | Freq: Every day | ORAL | Status: DC
  Administered 2022-06-09 – 2022-06-11 (×3): 40 mg via ORAL
  Filled 2022-06-09 (×3): qty 1

## 2022-06-09 MED ORDER — UBROGEPANT 100 MG PO TABS: 100.0000 mg | ORAL_TABLET | Freq: Every day | ORAL | Status: DC | PRN

## 2022-06-09 MED ORDER — IPRATROPIUM-ALBUTEROL 0.5-2.5 (3) MG/3ML IN SOLN
3.0000 mL | Freq: Four times a day (QID) | RESPIRATORY_TRACT | Status: DC
  Administered 2022-06-09: 3 mL via RESPIRATORY_TRACT
  Filled 2022-06-09: qty 3

## 2022-06-09 MED ORDER — METOPROLOL TARTRATE 25 MG PO TABS
25.0000 mg | ORAL_TABLET | Freq: Every day | ORAL | Status: DC
  Administered 2022-06-10 – 2022-06-11 (×2): 25 mg via ORAL
  Filled 2022-06-09 (×2): qty 1

## 2022-06-09 MED ORDER — ZOLPIDEM TARTRATE 5 MG PO TABS
5.0000 mg | ORAL_TABLET | Freq: Every day | ORAL | Status: DC
  Administered 2022-06-10 (×2): 5 mg via ORAL
  Filled 2022-06-09 (×2): qty 1

## 2022-06-09 NOTE — H&P (Signed)
History and Physical    Patient: Marilyn Fowler [id number] DOB: 04/12/1976 DOA: 06/09/2022 DOS: the patient was seen and examined on 06/09/2022 [facility]: [facility] [place]  Patient coming from: Home  Chief Complaint:  Chief Complaint  Patient presents with   Abnormal Lab   HPI: Marilyn Fowler is a 47 y.o. female with medical history significant of alcohol abuse, unspecified anemia, anxiety, depression, bipolar 2 disorder, benzodiazepine abuse, alcohol induced fatty liver disease, hemorrhoids, hypertension, migraine headaches, class 3 obesity, COVID-19 in December who was recently seen in the emergency department due to respiratory infection that she has been having since last week.  The patient got blood cultures and was discharged on oral antibiotics.  Unfortunately, blood cultures have grown an unspecified streptococcal species.  She stated that her symptoms are still present and has not improved a lot.  She still dyspneic, fatigue and wheezing.  She is having mostly dry cough with pleuritic chest pain and occasional yellowish/brownish sputum production. She denied fever, but has had chills, rhinorrhea and sore throat.  No palpitations, diaphoresis, PND, orthopnea or pitting edema of the lower extremities.  No abdominal pain, nausea, emesis, diarrhea, constipation, melena or hematochezia.  No flank pain, dysuria, frequency or hematuria.  No polyuria, polydipsia, polyphagia or blurred vision.   Lab work: CBC was normal.  Blood cultures x 2 were drawn.  Magnesium was 1.9 and phosphorus 3.4 mg/dL.  CMP showed a potassium of 3.4, chloride 112 and CO2 of 19 mmol/L with a normal anion gap.  The rest of the CMP values were normal.  Imaging: Portable 1 view chest radiograph did not show any active disease.  ED course: Initial vital signs were temperature 98.2 F, pulse 96, respiration 18, BP 130/78 mmHg O2 sat 100% on room air.  Patient received 1000 mL of normal saline bolus, ondansetron 4 mg IVP,  acetaminophen 1000 mg p.o. and 2 g of ceftriaxone IVPB.   Review of Systems: As mentioned in the history of present illness. All other systems reviewed and are negative. Past Medical History:  Diagnosis Date   Alcohol abuse    Anemia    Anemia    Anxiety    Blood transfusion without reported diagnosis    Depression    Hemorrhoid    Hypertension    Migraine    Past Surgical History:  Procedure Laterality Date   HEMORRHOID SURGERY     HERNIA REPAIR     TONSILLECTOMY     VENTRAL HERNIA REPAIR     Social History:  reports that she quit smoking about 2 years ago. She has never used smokeless tobacco. She reports that she does not currently use alcohol. She reports that she does not use drugs.  Allergies  Allergen Reactions   Robaxin [Methocarbamol] Other (See Comments)     Agitated    Toradol [Ketorolac Tromethamine] Other (See Comments)    Pt states it makes her jittery, felt she had something crawling on her    Trazodone And Nefazodone Other (See Comments)    Nightmare    Family History  Problem Relation Age of Onset   Osteoarthritis Mother    Diabetes Mother    Hypertension Mother    Depression Mother    Hypertension Father    Diabetes Father    Asthma Son    Deep vein thrombosis Maternal Grandmother    Deep vein thrombosis Maternal Grandfather    Stroke Neg Hx     Prior to Admission medications   Medication Sig Start  Date End Date Taking? Authorizing Provider  amLODipine (NORVASC) 2.5 MG tablet Take 1 tablet (2.5 mg total) by mouth daily. 01/19/20  Yes Clapacs, Madie Reno, MD  amoxicillin-clavulanate (AUGMENTIN) 500-125 MG tablet Take 1 tablet by mouth 2 (two) times daily. Completed on 06-02-22 05/26/22  Yes [provider]  Ascorbic Acid (VITAMIN C PO) Take 1 tablet by mouth daily.   Yes [provider]  azithromycin (ZITHROMAX) 250 MG tablet Take 1 tablet (250 mg total) by mouth daily. Take first 2 tablets together, then 1 every day until finished.  06/07/22  Yes Quintella Reichert, MD  Cholecalciferol (VITAMIN D3 PO) Take 2,000 Units by mouth daily.   Yes [provider]  clonazePAM (KLONOPIN) 1 MG tablet Take 1 mg by mouth 3 (three) times daily as needed for anxiety. 05/18/22  Yes [provider]  cyclobenzaprine (FLEXERIL) 10 MG tablet Take 10 mg by mouth 3 (three) times daily. 05/17/22  Yes [provider]  diphenhydrAMINE (BENADRYL) 50 MG capsule Take 50 mg by mouth every 8 (eight) hours as needed for sleep.   Yes [provider]  ferrous sulfate 325 (65 FE) MG tablet Take 1 tablet (325 mg total) by mouth 3 (three) times daily with meals. Patient taking differently: Take 325 mg by mouth daily with breakfast. 11/25/20 06/09/22 Yes Arnaldo Natal, MD  ibuprofen (ADVIL) 200 MG tablet Take 400 mg by mouth every 4 (four) hours as needed for headache (pain).   Yes [provider]  metoprolol succinate (TOPROL-XL) 25 MG 24 hr tablet Take 1 tablet (25 mg total) by mouth daily. 01/19/20  Yes Clapacs, Madie Reno, MD  Multiple Vitamin (MULTIVITAMIN) tablet Take 1 tablet by mouth daily.   Yes [provider]  omeprazole (PRILOSEC) 20 MG capsule Take 20 mg by mouth daily. 05/15/22  Yes [provider]  promethazine (PHENERGAN) 25 MG tablet Take 25 mg by mouth every 6 (six) hours as needed for nausea or vomiting. 05/15/22  Yes [provider]  UBRELVY 100 MG TABS Take 100 mg by mouth daily as needed (migraine). 05/27/22  Yes [provider]  zinc gluconate 50 MG tablet Take 50 mg by mouth daily.   Yes [provider]  zolpidem (AMBIEN) 10 MG tablet Take 10 mg by mouth at bedtime. 05/18/22  Yes [provider]  amoxicillin-clavulanate (AUGMENTIN) 875-125 MG tablet Take 1 tablet by mouth every 12 (twelve) hours. 01/16/21   Sponseller, Gypsy Balsam, PA-C  benzocaine (ORAJEL) 10 % mucosal gel Use as directed 1 application in the mouth or throat as needed for mouth pain. 01/16/21    Sponseller, Eugene Garnet R, PA-C  citalopram (CELEXA) 20 MG tablet Take 20 mg by mouth daily.    [provider]  dicyclomine (BENTYL) 20 MG tablet Take 1 tablet (20 mg total) by mouth 2 (two) times daily. 11/25/20   Arnaldo Natal, MD  hydrocortisone (ANUSOL-HC) 25 MG suppository Place 1 suppository (25 mg total) rectally 2 (two) times daily. 11/25/20   Arnaldo Natal, MD  ondansetron (ZOFRAN ODT) 4 MG disintegrating tablet Take 1 tablet (4 mg total) by mouth every 8 (eight) hours as needed for nausea or vomiting. Patient not taking: Reported on 06/09/2022 11/25/20   Arnaldo Natal, MD  buPROPion (WELLBUTRIN XL) 300 MG 24 hr tablet Take 1 tablet (300 mg total) by mouth daily. 04/03/20 04/03/20  Veryl Speak, MD    Physical Exam: Vitals:   06/09/22 1158 06/09/22 1201  BP: 130/78  Pulse: 96   Resp: 18   Temp: 98.2 F (36.8 C)   TempSrc: Oral   SpO2: 100%   Weight:  115.7 kg  Height:  5' (1.524 m)   Physical Exam Vitals and nursing note reviewed.  Constitutional:      Appearance: Normal appearance. She is obese.  HENT:     Head: Normocephalic.     Nose: No rhinorrhea.     Mouth/Throat:     Mouth: Mucous membranes are moist.  Eyes:     General: No scleral icterus.    Pupils: Pupils are equal, round, and reactive to light.  Cardiovascular:     Rate and Rhythm: Normal rate and regular rhythm.  Pulmonary:     Effort: Pulmonary effort is normal.     Breath sounds: Wheezing and rhonchi present. No rales.  Abdominal:     General: Bowel sounds are normal. There is no distension.     Palpations: Abdomen is soft.     Tenderness: There is no abdominal tenderness. There is no guarding.  Musculoskeletal:     Cervical back: Neck supple.     Right lower leg: No edema.     Left lower leg: No edema.  Skin:    General: Skin is warm and dry.  Neurological:     General: No focal deficit present.     Mental Status: She is alert and oriented to person, place, and time.  Psychiatric:         Mood and Affect: Mood normal.        Behavior: Behavior normal.     Data Reviewed:  Results are pending, will review when available.  Assessment and Plan: Principal Problem:   Streptococcal bacteremia In the setting of:   Acute bronchitis Associated with:   Reactive airway disease Admit to MedSurg/inpatient. As needed supplemental oxygen. Scheduled and as needed bronchodilators. Budesonide 2 mg inhaled every 12 hours. Continue ceftriaxone 2 g IVPB daily. Continue azithromycin 500 mg p.o. daily. Guaifenesin 600 mg p.o. twice daily. May use home oxycodone regimen for cough. Follow-up blood culture and sensitivity. Follow-up CBC and chemistry in the morning.  Active Problems:   HTN (hypertension) Continue metoprolol 25 mg p.o. daily.    Morbid obesity ([place]) Current BMI 50.89 kg/m. Follow-up with PCP and/or bariatric clinic.    Hypokalemia Replacement ordered. Magnesium supplementation. Follow-up potassium level in AM.    Tobacco abuse Has not smoked in a week. May begin NicoDerm as needed. Continue smoking cessation advised.     Advance Care Planning:   Code Status: Full Code   Consults:   Family Communication: Her mother was at bedside.  Severity of Illness: The appropriate patient status for this patient is INPATIENT. Inpatient status is judged to be reasonable and necessary in order to provide the required intensity of service to ensure the patient's safety. The patient's presenting symptoms, physical exam findings, and initial radiographic and laboratory data in the context of their chronic comorbidities is felt to place them at high risk for further clinical deterioration. Furthermore, it is not anticipated that the patient will be medically stable for discharge from the hospital within 2 midnights of admission.   * I certify that at the point of admission it is my clinical judgment that the patient will require inpatient hospital care spanning beyond 2  midnights from the point of admission due to high intensity of service, high risk for further deterioration and high frequency of surveillance required.*  Author: Reubin Milan, MD  06/09/2022 1:56 PM  For on call review [email or web address].   This document was prepared using Dragon voice recognition software and may contain some unintended transcription errors.

## 2022-06-09 NOTE — ED Notes (Signed)
ED TO INPATIENT HANDOFF REPORT  ED Nurse Name and Phone #: Waynette Buttery Name/Age/Gender Marilyn Fowler 47 y.o. female Room/Bed: WA12/WA12  Code Status   Code Status: Full Code  Home/SNF/Other Home Patient oriented to: self, place, time, and situation Is this baseline? Yes   Triage Complete: Triage complete  Chief Complaint Streptococcal bacteremia [R78.81, B95.5]  Triage Note Pt presents to ED from home reporting that she got a call today stating she had positive blood cultures at [facility] on Thursday. Seen there for SOB.    Allergies Allergies  Allergen Reactions   Robaxin [Methocarbamol] Other (See Comments)     Agitated    Toradol [Ketorolac Tromethamine] Other (See Comments)    Pt states it makes her jittery, felt she had something crawling on her    Trazodone And Nefazodone Other (See Comments)    Nightmare    Level of Care/Admitting Diagnosis ED Disposition     ED Disposition  Admit   Condition  --   [facility] [place]: [facility] [[id number]]  Level of Care: Med-Surg [16]  May admit patient to [facility] or [facility] if equivalent level of care is available:: No  Covid Evaluation: Confirmed COVID Negative  Diagnosis: Streptococcal bacteremia [id number]  Admitting Physician: Reubin Milan [id number]  Attending Physician: Reubin Milan [id number]  Certification:: I certify this patient will need inpatient services for at least 2 midnights  Estimated Length of Stay: 2          B Medical/Surgery History Past Medical History:  Diagnosis Date   Alcohol abuse    Anemia    Anemia    Anxiety    Blood transfusion without reported diagnosis    Depression    Hemorrhoid    Hypertension    Migraine    Past Surgical History:  Procedure Laterality Date   [place]       A IV Location/Drains/Wounds Patient Lines/Drains/Airways Status      Active Line/Drains/Airways     Name Placement date Placement time Site Days   Peripheral IV 06/09/22 20 G Posterior;Right Hand 06/09/22  1250  Hand  less than 1   Peripheral IV 06/09/22 20 G Left Antecubital 06/09/22  1258  Antecubital  less than 1            Intake/Output Last 24 hours No intake or output data in the 24 hours ending 06/09/22 1401  Labs/Imaging Results for orders placed or performed during the hospital encounter of 06/09/22 (from the past 48 hour(s))  CBC     Status: None   Collection Time: 06/09/22 12:09 PM  Result Value Ref Range   WBC 7.6 4.0 - 10.5 K/uL   RBC 4.85 3.87 - 5.11 MIL/uL   Hemoglobin 14.2 12.0 - 15.0 g/dL   HCT 43.3 36.0 - 46.0 %   MCV 89.3 80.0 - 100.0 fL   MCH 29.3 26.0 - 34.0 pg   MCHC 32.8 30.0 - 36.0 g/dL   RDW 14.1 11.5 - 15.5 %   Platelets 335 150 - 400 K/uL   nRBC 0.0 0.0 - 0.2 %    Comment: Performed at [facility], [place] [address], [place], [place] [address]  Comprehensive metabolic panel     Status: Abnormal   Collection Time: 06/09/22 12:09 PM  Result Value Ref Range   Sodium 138 135 - 145  mmol/L   Potassium 3.4 (L) 3.5 - 5.1 mmol/L   Chloride 112 (H) 98 - 111 mmol/L   CO2 19 (L) 22 - 32 mmol/L   Glucose, Bld 87 70 - 99 mg/dL    Comment: Glucose reference range applies only to samples taken after fasting for at least 8 hours.   BUN 8 6 - 20 mg/dL   Creatinine, Ser 0.74 0.44 - 1.00 mg/dL   Calcium 8.9 8.9 - 10.3 mg/dL   Total Protein 7.6 6.5 - 8.1 g/dL   Albumin 4.3 3.5 - 5.0 g/dL   AST 38 15 - 41 U/L   ALT 25 0 - 44 U/L   Alkaline Phosphatase 85 38 - 126 U/L   Total Bilirubin 0.5 0.3 - 1.2 mg/dL   GFR, Estimated >60 >60 mL/min    Comment: (NOTE) Calculated using the CKD-EPI Creatinine Equation (2021)    Anion gap 7 5 - 15    Comment: Performed at [facility], [place] [address], [place], [place] [address]   DG Chest Portable 1 View  Result Date: 06/09/2022 CLINICAL DATA:  Short of  breath.  Positive blood cultures. EXAM: PORTABLE CHEST 1 VIEW COMPARISON:  06/07/2022 and older exams. FINDINGS: Cardiac silhouette normal in size. No mediastinal or hilar masses. Clear lungs. No convincing pleural effusion. No pneumothorax. Skeletal structures are grossly intact. IMPRESSION: No active disease. Electronically Signed   By: Lajean Manes M.D.   On: 06/09/2022 13:24    Pending Labs Unresulted Labs (From admission, onward)     Start     Ordered   06/16/22 0500  Creatinine, serum  (enoxaparin (LOVENOX)    CrCl >/= 30 ml/min)  Weekly,   R     Comments: while on enoxaparin therapy    06/09/22 1352   06/10/22 0500  HIV Antibody (routine testing w rflx)  (HIV Antibody (Routine testing w reflex) panel)  Tomorrow morning,   R        06/09/22 1352   06/10/22 0500  CBC  Tomorrow morning,   R        06/09/22 1352   06/10/22 [id number]  Basic metabolic panel  Tomorrow morning,   R        06/09/22 1352   06/09/22 1353  Magnesium  Add-on,   AD        06/09/22 1352   06/09/22 1353  Phosphorus  Add-on,   AD        06/09/22 1352   06/09/22 1209  Blood culture (routine x 2)  BLOOD CULTURE X 2,   R (with STAT occurrences)      06/09/22 1218            Vitals/Pain Today's Vitals   06/09/22 1158 06/09/22 1201  BP: 130/78   Pulse: 96   Resp: 18   Temp: 98.2 F (36.8 C)   TempSrc: Oral   SpO2: 100%   Weight:  115.7 kg  Height:  5' (1.524 m)  PainSc:  0-No pain    Isolation Precautions No active isolations  Medications Medications  cefTRIAXone (ROCEPHIN) 2 g in sodium chloride 0.9 % 100 mL IVPB (has no administration in time range)  enoxaparin (LOVENOX) injection 40 mg (has no administration in time range)  acetaminophen (TYLENOL) tablet 650 mg (has no administration in time range)    Or  acetaminophen (TYLENOL) suppository 650 mg (has no administration in time range)  ondansetron (ZOFRAN) tablet 4 mg (has no administration in time range)  Or  ondansetron (ZOFRAN) injection  4 mg (has no administration in time range)  potassium chloride SA (KLOR-CON M) CR tablet 40 mEq (has no administration in time range)  cefTRIAXone (ROCEPHIN) 2 g in sodium chloride 0.9 % 100 mL IVPB (0 g Intravenous Stopped 06/09/22 1338)  sodium chloride 0.9 % bolus 1,000 mL (1,000 mLs Intravenous New Bag/Given 06/09/22 1257)  acetaminophen (TYLENOL) tablet 1,000 mg (1,000 mg Oral Given 06/09/22 1311)  ondansetron (ZOFRAN) injection 4 mg (4 mg Intravenous Given 06/09/22 1311)    Mobility walks     Focused Assessments Cardiac Assessment Handoff:    Lab Results  Component Value Date   CKTOTAL 41 09/17/2013   CKMB 0.1 09/17/2013   TROPONINI <0.03 06/30/2017   Lab Results  Component Value Date   DDIMER 0.29 07/13/2020   Does the Patient currently have chest pain? Yes    R Recommendations: See Admitting Provider Note  Report given to:   Additional Notes:

## 2022-06-09 NOTE — ED Notes (Signed)
Pt refused to dress out in gown. ?

## 2022-06-09 NOTE — ED Triage Notes (Signed)
Pt presents to ED from home reporting that she got a call today stating she had positive blood cultures at [facility] on Thursday. Seen there for SOB.

## 2022-06-09 NOTE — ED Provider Notes (Signed)
[facility] Provider Note   CSN: [id number] Arrival date & time: 06/09/22  1149     History  Chief Complaint  Patient presents with   Abnormal Lab    Marilyn Fowler is a 47 y.o. female with history of hemorrhoids, migraines, depression, anxiety, HTN, alcohol abuse who presents to the ER for positive blood cultures. Patient was seen on 2/9 for for chest pain and shortness of breath. Negative CT PE study, concern for possible benzodiazepine withdrawal. Diagnosed with bronchitis and hypokalemia, discharged with azithromycin. Presents today after being called that her blood cultures were positive. Complaining of persistent chest pain, shortness of breath, weakness and lightheadedness today. Nonproductive cough. Says she doesn't feel any different compared to her prior visit. Has been taking the prescribed azithromycin.    Abnormal Lab      Home Medications Prior to Admission medications   Medication Sig Start Date End Date Taking? Authorizing Provider  amLODipine (NORVASC) 2.5 MG tablet Take 1 tablet (2.5 mg total) by mouth daily. 01/19/20  Yes Clapacs, Madie Reno, MD  Ascorbic Acid (VITAMIN C PO) Take 1 tablet by mouth daily.   Yes [provider]  azithromycin (ZITHROMAX) 250 MG tablet Take 1 tablet (250 mg total) by mouth daily. Take first 2 tablets together, then 1 every day until finished. 06/07/22  Yes Quintella Reichert, MD  Cholecalciferol (VITAMIN D3 PO) Take 2,000 Units by mouth daily.   Yes [provider]  diphenhydrAMINE (BENADRYL) 50 MG capsule Take 50 mg by mouth every 8 (eight) hours as needed for sleep.   Yes [provider]  ferrous sulfate 325 (65 FE) MG tablet Take 1 tablet (325 mg total) by mouth 3 (three) times daily with meals. Patient taking differently: Take 325 mg by mouth daily with breakfast. 11/25/20 06/09/22 Yes Arnaldo Natal, MD  ibuprofen (ADVIL) 200 MG tablet Take 400 mg by mouth every 4 (four)  hours as needed for headache (pain).   Yes [provider]  metoprolol succinate (TOPROL-XL) 25 MG 24 hr tablet Take 1 tablet (25 mg total) by mouth daily. 01/19/20  Yes Clapacs, Madie Reno, MD  amoxicillin-clavulanate (AUGMENTIN) 875-125 MG tablet Take 1 tablet by mouth every 12 (twelve) hours. 01/16/21   Sponseller, Gypsy Balsam, PA-C  benzocaine (ORAJEL) 10 % mucosal gel Use as directed 1 application in the mouth or throat as needed for mouth pain. 01/16/21   Sponseller, Gypsy Balsam, PA-C  dicyclomine (BENTYL) 20 MG tablet Take 1 tablet (20 mg total) by mouth 2 (two) times daily. 11/25/20   Arnaldo Natal, MD  hydrocortisone (ANUSOL-HC) 25 MG suppository Place 1 suppository (25 mg total) rectally 2 (two) times daily. 11/25/20   Arnaldo Natal, MD  Multiple Vitamin (MULTIVITAMIN) tablet Take 1 tablet by mouth daily.    [provider]  naproxen (NAPROSYN) 500 MG tablet Take 1 tablet (500 mg total) by mouth 2 (two) times daily as needed. 01/13/21   Rancour, Annie Main, MD  ondansetron (ZOFRAN ODT) 4 MG disintegrating tablet Take 1 tablet (4 mg total) by mouth every 8 (eight) hours as needed for nausea or vomiting. 11/25/20   Arnaldo Natal, MD  propranolol (INDERAL) 20 MG tablet TAKE ONE TABLET BY MOUTH THREE TIMES A DAY AS NEEDED 11/24/20   Donnal Moat T, PA-C  QUEtiapine (SEROQUEL XR) 300 MG 24 hr tablet TAKE TWO TABLETS BY MOUTH EVERY NIGHT AT BEDTIME 02/08/21   Donnal Moat T, PA-C  zinc gluconate 50  MG tablet Take 50 mg by mouth daily.    [provider]  buPROPion (WELLBUTRIN XL) 300 MG 24 hr tablet Take 1 tablet (300 mg total) by mouth daily. 04/03/20 04/03/20  Veryl Speak, MD      Allergies    Robaxin [methocarbamol] and Toradol [ketorolac tromethamine]    Review of Systems   Review of Systems  Constitutional:  Positive for fatigue.  HENT:  Positive for congestion.   Respiratory:  Positive for cough and shortness of breath.   Cardiovascular:  Positive for chest pain.  All  other systems reviewed and are negative.   Physical Exam Updated Vital Signs BP 130/78 (BP Location: Left Arm)   Pulse 96   Temp 98.2 F (36.8 C) (Oral)   Resp 18   Ht 5' (1.524 m)   Wt 115.7 kg   LMP 05/22/2022 (Approximate)   SpO2 100%   BMI 49.80 kg/m  Physical Exam Vitals and nursing note reviewed.  Constitutional:      Appearance: Normal appearance.  HENT:     Head: Normocephalic and atraumatic.  Eyes:     Conjunctiva/sclera: Conjunctivae normal.  Cardiovascular:     Rate and Rhythm: Normal rate and regular rhythm.  Pulmonary:     Effort: Pulmonary effort is normal. No respiratory distress.     Breath sounds: Normal breath sounds.  Abdominal:     General: There is no distension.     Palpations: Abdomen is soft.     Tenderness: There is no abdominal tenderness.  Skin:    General: Skin is warm and dry.  Neurological:     General: No focal deficit present.     Mental Status: She is alert.    ED Results / Procedures / Treatments   Labs (all labs ordered are listed, but only abnormal results are displayed) Labs Reviewed  COMPREHENSIVE METABOLIC PANEL - Abnormal; Notable for the following components:      Result Value   Potassium 3.4 (*)    Chloride 112 (*)    CO2 19 (*)    All other components within normal limits  CULTURE, BLOOD (ROUTINE X 2)  CULTURE, BLOOD (ROUTINE X 2)  CBC    EKG None  Radiology DG Chest Portable 1 View  Result Date: 06/09/2022 CLINICAL DATA:  Short of breath.  Positive blood cultures. EXAM: PORTABLE CHEST 1 VIEW COMPARISON:  06/07/2022 and older exams. FINDINGS: Cardiac silhouette normal in size. No mediastinal or hilar masses. Clear lungs. No convincing pleural effusion. No pneumothorax. Skeletal structures are grossly intact. IMPRESSION: No active disease. Electronically Signed   By: Lajean Manes M.D.   On: 06/09/2022 13:24    Procedures Procedures    Medications Ordered in ED Medications  cefTRIAXone (ROCEPHIN) 2 g in  sodium chloride 0.9 % 100 mL IVPB (0 g Intravenous Stopped 06/09/22 1338)  sodium chloride 0.9 % bolus 1,000 mL (1,000 mLs Intravenous New Bag/Given 06/09/22 1257)  acetaminophen (TYLENOL) tablet 1,000 mg (1,000 mg Oral Given 06/09/22 1311)  ondansetron (ZOFRAN) injection 4 mg (4 mg Intravenous Given 06/09/22 1311)    ED Course/ Medical Decision Making/ A&P                             Medical Decision Making Amount and/or Complexity of Data Reviewed Labs: ordered.  This patient is a 47 y.o. female  who presents to the ED for concern of positive blood cultures. Persistent flu like symptoms with associated  chest discomfort and shortness of breath.   Past Medical History / Co-morbidities: hemorrhoids, migraines, depression, anxiety, HTN, alcohol abuse   Additional history: Chart reviewed. Pertinent results include: patient seen in ER on 2/9 for chest pain and shortness of breath. Diagnosed with bronchitis, discharged with azithromycin. Called today for positive blood cultures. Chart review shows blood cultures positive for strep species, only one culture was obtained.   Physical Exam: Physical exam performed. The pertinent findings include: Normal vital signs, in no acute distress. Normal respiratory effort and O2 saturation, clear lung sounds.   Lab Tests/Imaging studies: I personally interpreted labs/imaging and the pertinent results include:  no leukocytosis, normal hemoglobin. Potassium 3.4, improved from prior visit. Otherwise CMP unremarkable. Blood cultures collected and results pending.  CXR shows no acute cardiopulmonary abnormalities. I agree with the radiologist interpretation.  Cardiac monitoring: EKG obtained and interpreted by my attending physician which shows: sinus rhythm with low voltage   Medications: I ordered medication including IV antibiotics, tylenol, zofran.  I have reviewed the patients home medicines and have made adjustments as needed.  Consultations  obtained: I consulted with ED pharmacist who reviewed patient's chart, comorbidities, and culture results, recommended repeat cultures and starting 2g IV rocephin.  I consulted with hospitalist Dr Olevia Bowens who will admit.   Disposition: After consideration of the diagnostic results and the patients response to treatment, I feel that patient would benefit from medical admission for continued IV antibiotics and follow up on repeat blood cultures.   I discussed this case with my attending physician Dr. Kathrynn Humble who cosigned this note including patient's presenting symptoms, physical exam, and planned diagnostics and interventions. Attending physician stated agreement with plan or made changes to plan which were implemented.   Final Clinical Impression(s) / ED Diagnoses Final diagnoses:  Positive blood culture  Shortness of breath  Streptococcal bacteremia    Rx / DC Orders ED Discharge Orders     None      Portions of this report may have been transcribed using voice recognition software. Every effort was made to ensure accuracy; however, inadvertent computerized transcription errors may be present.    Estill Cotta 06/09/22 [place], Ankit, MD 06/12/22 1515

## 2022-06-10 ENCOUNTER — Inpatient Hospital Stay (HOSPITAL_COMMUNITY): Payer: No Typology Code available for payment source

## 2022-06-10 DIAGNOSIS — R0602 Shortness of breath: Secondary | ICD-10-CM | POA: Diagnosis not present

## 2022-06-10 DIAGNOSIS — R7881 Bacteremia: Secondary | ICD-10-CM | POA: Diagnosis not present

## 2022-06-10 DIAGNOSIS — I10 Essential (primary) hypertension: Secondary | ICD-10-CM | POA: Diagnosis not present

## 2022-06-10 DIAGNOSIS — J189 Pneumonia, unspecified organism: Secondary | ICD-10-CM | POA: Diagnosis present

## 2022-06-10 DIAGNOSIS — Z6841 Body Mass Index (BMI) 40.0 and over, adult: Secondary | ICD-10-CM

## 2022-06-10 DIAGNOSIS — F17210 Nicotine dependence, cigarettes, uncomplicated: Secondary | ICD-10-CM | POA: Diagnosis present

## 2022-06-10 DIAGNOSIS — E876 Hypokalemia: Secondary | ICD-10-CM

## 2022-06-10 DIAGNOSIS — F1010 Alcohol abuse, uncomplicated: Secondary | ICD-10-CM | POA: Diagnosis not present

## 2022-06-10 LAB — ECHOCARDIOGRAM COMPLETE
Area-P 1/2: 3.3 cm2
Height: 60 in
S' Lateral: 2.4 cm
Weight: 4169.34 oz

## 2022-06-10 LAB — BASIC METABOLIC PANEL
Anion gap: 7 (ref 5–15)
BUN: 7 mg/dL (ref 6–20)
CO2: 18 mmol/L — ABNORMAL LOW (ref 22–32)
Calcium: 8.5 mg/dL — ABNORMAL LOW (ref 8.9–10.3)
Chloride: 110 mmol/L (ref 98–111)
Creatinine, Ser: 0.57 mg/dL (ref 0.44–1.00)
GFR, Estimated: >60 mL/min (ref >60)
Glucose, Bld: 137 mg/dL — ABNORMAL HIGH (ref 70–99)
Potassium: 4.6 mmol/L (ref 3.5–5.1)
Sodium: 135 mmol/L (ref 135–145)

## 2022-06-10 LAB — HIV ANTIBODY (ROUTINE TESTING W REFLEX): HIV Screen 4th Generation wRfx: NONREACTIVE

## 2022-06-10 LAB — CBC
HCT: 38.4 % (ref 36.0–46.0)
Hemoglobin: 12.6 g/dL (ref 12.0–15.0)
MCH: 29.4 pg (ref 26.0–34.0)
MCHC: 32.8 g/dL (ref 30.0–36.0)
MCV: 89.5 fL (ref 80.0–100.0)
Platelets: 314 10*3/uL (ref 150–400)
RBC: 4.29 MIL/uL (ref 3.87–5.11)
RDW: 14.1 % (ref 11.5–15.5)
WBC: 7.3 10*3/uL (ref 4.0–10.5)
nRBC: 0 % (ref 0.0–0.2)

## 2022-06-10 MED ORDER — HYDRALAZINE HCL 20 MG/ML IJ SOLN: 10.0000 mg | Freq: Four times a day (QID) | INTRAMUSCULAR | Status: DC | PRN

## 2022-06-10 MED ORDER — ADULT MULTIVITAMIN W/MINERALS CH
1.0000 | ORAL_TABLET | Freq: Every day | ORAL | Status: DC
  Administered 2022-06-11: 1 via ORAL
  Filled 2022-06-10: qty 1

## 2022-06-10 MED ORDER — FUROSEMIDE 10 MG/ML IJ SOLN
20.0000 mg | Freq: Once | INTRAMUSCULAR | Status: AC
  Administered 2022-06-11: 20 mg via INTRAVENOUS
  Filled 2022-06-10: qty 2

## 2022-06-10 NOTE — Assessment & Plan Note (Signed)
Continue home regimen of metoprolol As needed intravenous antihypertensives for markedly elevated blood pressure

## 2022-06-10 NOTE — Assessment & Plan Note (Signed)
Counseling on cessation

## 2022-06-10 NOTE — Assessment & Plan Note (Signed)
As patient clinically improved counseling patient on caloric restriction and regular physical activity

## 2022-06-10 NOTE — Assessment & Plan Note (Signed)
Blood culture obtained during 2/9 emergency department workup grew out Streptococcus parasanguinis Unclear source of bacteremia Despite patient's presentation with shortness of breath and wheezing, repeat chest imaging reveals no definitive evidence of pneumonia Discontinuing azithromycin as literature reveals that this organism is resistant.  Treating with ceftriaxone monotherapy Continue to follow repeat cultures to ensure clearance

## 2022-06-10 NOTE — Progress Notes (Signed)
PT Cancellation Note  Patient Details Name: Marilyn Fowler MRN: [id number] DOB: [birth year]   Cancelled Treatment:    Reason Eval/Treat Not Completed: PT screened, no needs identified, will sign off, patient declines need for PT at this time, up ad lib.  [place] Office [phone] Weekend [phone]    [name] 06/10/2022, 10:03 AM

## 2022-06-10 NOTE — Progress Notes (Signed)
OT Cancellation Note  Patient Details Name: Marilyn Fowler MRN: [id number] DOB: 05/28/1975   Cancelled Treatment:    Reason Eval/Treat Not Completed: OT screened, no needs identified, will sign off Per PT, patient is independent at this time. OT to sign off. Thank you for this referral.  Rennie Plowman, MS Acute Rehabilitation Department Office# [phone]  06/10/2022, 10:08 AM

## 2022-06-10 NOTE — Progress Notes (Signed)
PROGRESS NOTE   Marilyn Fowler  [id number] DOB: 1975/09/15 DOA: 06/09/2022 [facility]CP: [facility]   Date of Service: the patient was seen and examined on 06/10/2022  Brief Narrative:  47 y.o. female with medical history significant of alcohol abuse, anemia, anxiety, depression, bipolar 2 disorder, benzodiazepine abuse, alcohol induced fatty liver disease, hypertension, class 3 obesity presenting with a 1 1/2 week history of shortness of breath and wheezing.  Patient presented to [facility] ED on 2/9 with a 1 week history of intermittent fevers shortness of breath and cough.  On evaluation CT angiogram of the chest revealed no evidence of pneumonia or pulmonary embolism and patient was discharged home with azithromycin.  In the days that followed, the patient's symptoms did not resolve and patient was contacted to return to the emergency department after the blood cultures obtained during the 2/9 evaluation preliminarily came back positive for Streptococcus.  The hospitalist group was then called to assess the patient for admission the hospital for continued symptoms and strep bacteremia.       Assessment and Plan: * Streptococcal bacteremia Blood culture obtained during 2/9 emergency department workup grew out Streptococcus parasanguinis Unclear source of bacteremia Despite patient's presentation with shortness of breath and wheezing, repeat chest imaging reveals no definitive evidence of pneumonia Discontinuing azithromycin as literature reveals that this organism is resistant.  Treating with ceftriaxone monotherapy Continue to follow repeat cultures to ensure clearance  Shortness of breath Patient complaining of 1 and half weeks of shortness of breath and wheezing Serial chest imaging revealing no definitive evidence of pneumonia Patient exhibiting somewhat coarse breath sounds on exam with scattered rhonchi No clinical evidence of acute congestive heart failure/pulmonary  edema As needed bronchodilator therapy Monitoring for clinical improvement  Alcohol abuse Counseling on cessation  CIWA protocol  As needed benzodiazepines for evidence of withdrawal   Essential hypertension Continue home regimen of metoprolol As needed intravenous antihypertensives for markedly elevated blood pressure  Nicotine dependence, cigarettes, uncomplicated Counseling on cessation  Hypokalemia Replaced  Class 3 severe obesity due to excess calories with serious comorbidity and body mass index (BMI) of 50.0 to 59.9 in adult [facility]) As patient clinically improved counseling patient on caloric restriction and regular physical activity        Subjective:  Patient complaining of continued shortness of breath, worse with exertion and improved with rest, moderate intensity.  Improved since yesterday.  Physical Exam:  Vitals:   06/10/22 0322 06/10/22 1020 06/10/22 1439 06/10/22 1940  BP: 138/77 (!) 140/78 127/88 117/64  Pulse: (!) 102 95 88 78  Resp: 18  14 17  $ Temp: 97.7 F (36.5 C)  98 F (36.7 C) 98 F (36.7 C)  TempSrc: Oral  Oral Oral  SpO2: 98%  96% 96%  Weight:      Height:        Constitutional: Awake alert and oriented x3, no associated distress.   Skin: no rashes, no lesions, good skin turgor noted. Eyes: Pupils are equally reactive to light.  No evidence of scleral icterus or conjunctival pallor.  ENMT: Moist mucous membranes noted.  Posterior pharynx clear of any exudate or lesions.   Respiratory: Scattered rhonchi bilaterally with intermittent mild expiratory wheezing Cardiovascular: Regular rate and rhythm, no murmurs / rubs / gallops. No extremity edema. 2+ pedal pulses. No carotid bruits.  Abdomen: Abdomen is soft and nontender.  No evidence of intra-abdominal masses.  Positive bowel sounds noted in all quadrants.   Musculoskeletal: No joint deformity upper and  lower extremities. Good ROM, no contractures. Normal muscle tone.    Data  Reviewed:  I have personally reviewed and interpreted labs, imaging.  Significant findings are   CBC: Recent Labs  Lab 06/07/22 0002 06/07/22 0009 06/09/22 1209 06/10/22 0452  WBC  --  13.3* 7.6 7.3  NEUTROABS  --  7.7  --   --   HGB 16.0* 15.6* 14.2 12.6  HCT 47.0* 45.2 43.3 38.4  MCV  --  83.9 89.3 89.5  PLT  --  496* 335 [id number]   Basic Metabolic Panel: Recent Labs  Lab 06/07/22 0002 06/07/22 0009 06/09/22 1209 06/10/22 0452  NA 142 136 138 135  K 3.3* 3.1* 3.4* 4.6  CL  --  107 112* 110  CO2  --  17* 19* 18*  GLUCOSE  --  127* 87 137*  BUN  --  12 8 7  $ CREATININE  --  0.98 0.74 0.57  CALCIUM  --  9.4 8.9 8.5*  MG  --   --  1.8  --   PHOS  --   --  3.4  --    GFR: Estimated Creatinine Clearance: 104.6 mL/min (by C-G formula based on SCr of 0.57 mg/dL). Liver Function Tests: Recent Labs  Lab 06/07/22 0009 06/09/22 1209  AST 40 38  ALT 22 25  ALKPHOS 90 85  BILITOT 0.8 0.5  PROT 8.3* 7.6  ALBUMIN 4.7 4.3     Code Status:  Full code.  Code status decision has been confirmed with: patient    Severity of Illness:  The appropriate patient status for this patient is INPATIENT. Inpatient status is judged to be reasonable and necessary in order to provide the required intensity of service to ensure the patient's safety. The patient's presenting symptoms, physical exam findings, and initial radiographic and laboratory data in the context of their chronic comorbidities is felt to place them at high risk for further clinical deterioration. Furthermore, it is not anticipated that the patient will be medically stable for discharge from the hospital within 2 midnights of admission.   * I certify that at the point of admission it is my clinical judgment that the patient will require inpatient hospital care spanning beyond 2 midnights from the point of admission due to high intensity of service, high risk for further deterioration and high frequency of surveillance  required.*  Time spent:  54 minutes  Author:  Vernelle Emerald MD  06/10/2022 10:07 PM

## 2022-06-10 NOTE — Progress Notes (Signed)
Chaplain engaged in an initial visit with Marilyn Fowler.  Chaplain provided education and support around Scientist, physiological, Healthcare POA.  Marilyn Fowler desires to appoint her mom as her healthcare agent.  Chaplain is going to complete the paperwork today and then have Chaplain paged tomorrow morning to have it notarized.  Marilyn Fowler desires to talk with her mom.  Chaplain will follow-up.   Bea Graff, MDiv  06/10/22 1300  Spiritual Encounters  Type of Visit Initial  Care provided to: Patient  Referral source Patient request  Reason for visit Advance directives  Spiritual Framework  Presenting Themes Goals in life/care

## 2022-06-10 NOTE — Assessment & Plan Note (Signed)
Patient complaining of 1 and half weeks of shortness of breath and wheezing Serial chest imaging revealing no definitive evidence of pneumonia Patient exhibiting somewhat coarse breath sounds on exam with scattered rhonchi No clinical evidence of acute congestive heart failure/pulmonary edema As needed bronchodilator therapy Monitoring for clinical improvement

## 2022-06-10 NOTE — Progress Notes (Signed)
  Transition of Care [facility]) Screening Note   Patient Details  Name: Marilyn Fowler Date of Birth: 28-Dec-1975   Transition of Care [facility]) CM/SW Contact:    Vassie Moselle, LCSW Phone Number: 06/10/2022, 12:58 PM    Transition of Care Department [facility]) has reviewed patient and no TOC needs have been identified at this time. We will continue to monitor patient advancement through interdisciplinary progression rounds. If new patient transition needs arise, please place a TOC consult.

## 2022-06-10 NOTE — Progress Notes (Signed)
  Echocardiogram 2D Echocardiogram has been performed.  [name] M 06/10/2022, 1:14 PM

## 2022-06-10 NOTE — Hospital Course (Signed)
47 y.o. female with medical history significant of alcohol abuse, anemia, anxiety, depression, bipolar 2 disorder, benzodiazepine abuse, alcohol induced fatty liver disease, hypertension, class 3 obesity presenting with a 1 1/2 week history of shortness of breath and wheezing.  Patient presented to [facility] ED on 2/9 with a 1 week history of intermittent fevers shortness of breath and cough.  On evaluation CT angiogram of the chest revealed no evidence of pneumonia or pulmonary embolism and patient was discharged home with azithromycin.  In the days that followed, the patient's symptoms did not resolve and patient was contacted to return to the emergency department after the blood cultures obtained during the 2/9 evaluation preliminarily came back positive for Streptococcus.  The hospitalist group was then called to assess the patient for admission the hospital for continued symptoms and strep bacteremia.

## 2022-06-10 NOTE — Assessment & Plan Note (Signed)
Replaced. °

## 2022-06-10 NOTE — Progress Notes (Signed)
Initial Nutrition Assessment  DOCUMENTATION CODES:   Morbid obesity  INTERVENTION:  - Heart Healthy diet. - Daily multivitamin. - Monitor weight trends.   Nutrition status is stable at this time, will sign off.    NUTRITION DIAGNOSIS:   Increased nutrient needs related to acute illness as evidenced by estimated needs.  GOAL:   Patient will meet greater than or equal to 90% of their needs  MONITOR:   PO intake, Weight trends  REASON FOR ASSESSMENT:   Consult Assessment of nutrition requirement/status  ASSESSMENT:   47 y.o. female with medical history significant of alcohol abuse, unspecified anemia, anxiety, depression, bipolar 2 disorder, benzodiazepine abuse, alcohol induced fatty liver disease, hemorrhoids, hypertension, migraine headaches, class 3 obesity, COVID-19 in December who was recently seen in the emergency department due to respiratory infection that she has been having since last week.  Patient reports a UBW of 330# she has weighed in December 2022 and intentional weight loss over the past year after being put on a medication to aid in weight loss. Per EMR, last weight prior to this admission was back in September 2022 when patient was weighed at 309#. Weight this admission at 261#. Patient notes she was happy with weight loss but was recently told she could no longer be on the medication. Unsure as to why.   Patient endorses eating 2 meals a day at home. Usually has a quick breakfast and a dinner. She reports she is very busy at work so often doesn't eat lunch.  Current appetite is reported to be normal and patient endorses eating well yesterday and at breakfast this morning. Encouraged intake of 3 healthful meals a day. No questions or concerns.  Medications reviewed and include: -  Labs reviewed: -   NUTRITION - FOCUSED PHYSICAL EXAM:  Flowsheet Row Most Recent Value  Orbital Region No depletion  Upper Arm Region No depletion  Thoracic and Lumbar  Region No depletion  Buccal Region No depletion  Temple Region No depletion  Clavicle Bone Region No depletion  Clavicle and Acromion Bone Region No depletion  Scapular Bone Region Unable to assess  Dorsal Hand No depletion  Patellar Region No depletion  Anterior Thigh Region No depletion  Posterior Calf Region No depletion  Edema (RD Assessment) None  Hair Reviewed  Eyes Reviewed  Mouth Reviewed  Skin Reviewed  Nails Reviewed       Diet Order:   Diet Order             Diet Heart Room service appropriate? Yes; Fluid consistency: Thin  Diet effective now                   EDUCATION NEEDS:  No education needs have been identified at this time  Skin:  Skin Assessment: Reviewed RN Assessment  Last BM:  2/11  Height:  Ht Readings from Last 1 Encounters:  06/09/22 5' (1.524 m)   Weight:  Wt Readings from Last 1 Encounters:  06/09/22 118.2 kg   Ideal Body Weight:  45.45 kg  BMI:  Body mass index is 50.89 kg/m.  Estimated Nutritional Needs:  Kcal:  1750-1900 kcals Protein:  70-90 grams Fluid:  >/= 1.7L    Samson Frederic RD, LDN For contact information, refer to [facility].

## 2022-06-10 NOTE — Assessment & Plan Note (Signed)
Counseling on cessation  CIWA protocol  As needed benzodiazepines for evidence of withdrawal

## 2022-06-11 DIAGNOSIS — J189 Pneumonia, unspecified organism: Secondary | ICD-10-CM

## 2022-06-11 DIAGNOSIS — J45901 Unspecified asthma with (acute) exacerbation: Secondary | ICD-10-CM

## 2022-06-11 DIAGNOSIS — I10 Essential (primary) hypertension: Secondary | ICD-10-CM | POA: Diagnosis not present

## 2022-06-11 DIAGNOSIS — F1010 Alcohol abuse, uncomplicated: Secondary | ICD-10-CM | POA: Diagnosis not present

## 2022-06-11 LAB — COMPREHENSIVE METABOLIC PANEL
ALT: 21 U/L (ref 0–44)
AST: 29 U/L (ref 15–41)
Albumin: 4.2 g/dL (ref 3.5–5.0)
Alkaline Phosphatase: 78 U/L (ref 38–126)
Anion gap: 10 (ref 5–15)
BUN: 8 mg/dL (ref 6–20)
CO2: 21 mmol/L — ABNORMAL LOW (ref 22–32)
Calcium: 9 mg/dL (ref 8.9–10.3)
Chloride: 107 mmol/L (ref 98–111)
Creatinine, Ser: 0.77 mg/dL (ref 0.44–1.00)
GFR, Estimated: >60 mL/min (ref >60)
Glucose, Bld: 126 mg/dL — ABNORMAL HIGH (ref 70–99)
Potassium: 4 mmol/L (ref 3.5–5.1)
Sodium: 138 mmol/L (ref 135–145)
Total Bilirubin: 0.4 mg/dL (ref 0.3–1.2)
Total Protein: 7 g/dL (ref 6.5–8.1)

## 2022-06-11 LAB — CBC WITH DIFFERENTIAL/PLATELET
Abs Immature Granulocytes: 0.02 10*3/uL (ref 0.00–0.07)
Basophils Absolute: 0.1 10*3/uL (ref 0.0–0.1)
Basophils Relative: 1 %
Eosinophils Absolute: 0.1 10*3/uL (ref 0.0–0.5)
Eosinophils Relative: 2 %
HCT: 40.1 % (ref 36.0–46.0)
Hemoglobin: 13.1 g/dL (ref 12.0–15.0)
Immature Granulocytes: 0 %
Lymphocytes Relative: 43 %
Lymphs Abs: 2.7 10*3/uL (ref 0.7–4.0)
MCH: 29.6 pg (ref 26.0–34.0)
MCHC: 32.7 g/dL (ref 30.0–36.0)
MCV: 90.5 fL (ref 80.0–100.0)
Monocytes Absolute: 0.4 10*3/uL (ref 0.1–1.0)
Monocytes Relative: 6 %
Neutro Abs: 3 10*3/uL (ref 1.7–7.7)
Neutrophils Relative %: 48 %
Platelets: 310 10*3/uL (ref 150–400)
RBC: 4.43 MIL/uL (ref 3.87–5.11)
RDW: 14.6 % (ref 11.5–15.5)
WBC: 6.2 10*3/uL (ref 4.0–10.5)
nRBC: 0 % (ref 0.0–0.2)

## 2022-06-11 LAB — PROCALCITONIN: Procalcitonin: 1.29 ng/mL

## 2022-06-11 LAB — MAGNESIUM: Magnesium: 2.1 mg/dL (ref 1.7–2.4)

## 2022-06-11 LAB — C-REACTIVE PROTEIN: CRP: 0.7 mg/dL (ref <1.0)

## 2022-06-11 LAB — CULTURE, BLOOD (ROUTINE X 2): Special Requests: ADEQUATE

## 2022-06-11 MED ORDER — ONDANSETRON 4 MG PO TBDP: 4.0000 mg | ORAL_TABLET | Freq: Three times a day (TID) | ORAL | 0 refills | Status: DC | PRN

## 2022-06-11 MED ORDER — ALBUTEROL SULFATE HFA 108 (90 BASE) MCG/ACT IN AERS: 2.0000 | INHALATION_SPRAY | RESPIRATORY_TRACT | 2 refills | Status: DC | PRN

## 2022-06-11 MED ORDER — AZITHROMYCIN 500 MG PO TABS: 500.0000 mg | ORAL_TABLET | Freq: Every day | ORAL | 0 refills | Status: AC

## 2022-06-11 MED ORDER — CEFDINIR 300 MG PO CAPS: 300.0000 mg | ORAL_CAPSULE | Freq: Two times a day (BID) | ORAL | 0 refills | Status: DC

## 2022-06-11 MED ORDER — PREDNISONE 20 MG PO TABS: 40.0000 mg | ORAL_TABLET | Freq: Every day | ORAL | 0 refills | Status: AC

## 2022-06-11 NOTE — Progress Notes (Incomplete)
PROGRESS NOTE   Marilyn Fowler  [id number] DOB: 1975/07/24 DOA: 06/09/2022 [facility]CP: [facility] [place]   Date of Service: the patient was seen and examined on 06/11/2022  Brief Narrative:  47 y.o. female with medical history significant of alcohol abuse, anemia, anxiety, depression, bipolar 2 disorder, benzodiazepine abuse, alcohol induced fatty liver disease, hypertension, class 3 obesity presenting with a 1 1/2 week history of shortness of breath and wheezing.  Patient presented to [facility] ED on 2/9 with a 1 week history of intermittent fevers shortness of breath and cough.  On evaluation CT angiogram of the chest revealed no evidence of pneumonia or pulmonary embolism and patient was discharged home with azithromycin.  In the days that followed, the patient's symptoms did not resolve and patient was contacted to return to the emergency department after the blood cultures obtained during the 2/9 evaluation preliminarily came back positive for Streptococcus.  The hospitalist group was then called to assess the patient for admission the hospital for continued symptoms and strep bacteremia.       Assessment and Plan: * Streptococcal bacteremia Blood culture obtained during 2/9 emergency department workup grew out Streptococcus parasanguinis Unclear source of bacteremia Despite patient's presentation with shortness of breath and wheezing, repeat chest imaging reveals no definitive evidence of pneumonia Discontinuing azithromycin as literature reveals that this organism is resistant.  Treating with ceftriaxone monotherapy Continue to follow repeat cultures to ensure clearance  Shortness of breath Patient complaining of 1 and half weeks of shortness of breath and wheezing Serial chest imaging revealing no definitive evidence of pneumonia Patient exhibiting somewhat coarse breath sounds on exam with scattered rhonchi No clinical evidence of acute congestive heart failure/pulmonary  edema As needed bronchodilator therapy Monitoring for clinical improvement  Alcohol abuse Counseling on cessation  CIWA protocol  As needed benzodiazepines for evidence of withdrawal   Essential hypertension Continue home regimen of metoprolol As needed intravenous antihypertensives for markedly elevated blood pressure  Nicotine dependence, cigarettes, uncomplicated Counseling on cessation  Hypokalemia Replaced  Class 3 severe obesity due to excess calories with serious comorbidity and body mass index (BMI) of 50.0 to 59.9 in adult [facility]) As patient clinically improved counseling patient on caloric restriction and regular physical activity        Subjective:  Patient complaining of continued shortness of breath, worse with exertion and improved with rest, moderate intensity.  Improved since yesterday.  Physical Exam:  Vitals:   06/10/22 1020 06/10/22 1439 06/10/22 1940 06/11/22 0520  BP: (!) 140/78 127/88 117/64 (!) 138/96  Pulse: 95 88 78 92  Resp:  14 17 18  $ Temp:  98 F (36.7 C) 98 F (36.7 C) 97.7 F (36.5 C)  TempSrc:  Oral Oral Oral  SpO2:  96% 96% 97%  Weight:      Height:        Constitutional: Awake alert and oriented x3, no associated distress.   Skin: no rashes, no lesions, good skin turgor noted. Eyes: Pupils are equally reactive to light.  No evidence of scleral icterus or conjunctival pallor.  ENMT: Moist mucous membranes noted.  Posterior pharynx clear of any exudate or lesions.   Respiratory: Scattered rhonchi bilaterally with intermittent mild expiratory wheezing Cardiovascular: Regular rate and rhythm, no murmurs / rubs / gallops. No extremity edema. 2+ pedal pulses. No carotid bruits.  Abdomen: Abdomen is soft and nontender.  No evidence of intra-abdominal masses.  Positive bowel sounds noted in all quadrants.   Musculoskeletal: No joint deformity upper and  lower extremities. Good ROM, no contractures. Normal muscle tone.    Data  Reviewed:  I have personally reviewed and interpreted labs, imaging.  Significant findings are   CBC: Recent Labs  Lab 06/07/22 0002 06/07/22 0009 06/09/22 1209 06/10/22 0452 06/11/22 0711  WBC  --  13.3* 7.6 7.3 6.2  NEUTROABS  --  7.7  --   --  3.0  HGB 16.0* 15.6* 14.2 12.6 13.1  HCT 47.0* 45.2 43.3 38.4 40.1  MCV  --  83.9 89.3 89.5 90.5  PLT  --  496* 335 314 [id number]    Basic Metabolic Panel: Recent Labs  Lab 06/07/22 0002 06/07/22 0009 06/09/22 1209 06/10/22 0452 06/11/22 0711  NA 142 136 138 135 138  K 3.3* 3.1* 3.4* 4.6 4.0  CL  --  107 112* 110 107  CO2  --  17* 19* 18* 21*  GLUCOSE  --  127* 87 137* 126*  BUN  --  12 8 7 8  $ CREATININE  --  0.98 0.74 0.57 0.77  CALCIUM  --  9.4 8.9 8.5* 9.0  MG  --   --  1.8  --  2.1  PHOS  --   --  3.4  --   --     GFR: Estimated Creatinine Clearance: 104.6 mL/min (by C-G formula based on SCr of 0.77 mg/dL). Liver Function Tests: Recent Labs  Lab 06/07/22 0009 06/09/22 1209 06/11/22 0711  AST 40 38 29  ALT 22 25 21  $ ALKPHOS 90 85 78  BILITOT 0.8 0.5 0.4  PROT 8.3* 7.6 7.0  ALBUMIN 4.7 4.3 4.2      Code Status:  Full code.  Code status decision has been confirmed with: patient    Severity of Illness:  The appropriate patient status for this patient is INPATIENT. Inpatient status is judged to be reasonable and necessary in order to provide the required intensity of service to ensure the patient's safety. The patient's presenting symptoms, physical exam findings, and initial radiographic and laboratory data in the context of their chronic comorbidities is felt to place them at high risk for further clinical deterioration. Furthermore, it is not anticipated that the patient will be medically stable for discharge from the hospital within 2 midnights of admission.   * I certify that at the point of admission it is my clinical judgment that the patient will require inpatient hospital care spanning beyond 2 midnights  from the point of admission due to high intensity of service, high risk for further deterioration and high frequency of surveillance required.*  Time spent:  54 minutes  Author:  Vernelle Emerald MD  06/11/2022 9:04 AM

## 2022-06-11 NOTE — Discharge Instructions (Signed)
Please take all prescribed medications as instructed including the remainder of your antibiotics You have also been prescribed a short course of steroids to assist with your shortness of breath and wheezing please take this medication to completion. Furthermore you have been prescribed as needed albuterol therapy, please use it every 4 hours as needed for bouts of wheezing and shortness of breath. Please follow-up with your primary care provider in 1 to 2 weeks May return to work on 2/19 without restriction. Advance diet and activity level as tolerated.

## 2022-06-12 ENCOUNTER — Telehealth (HOSPITAL_BASED_OUTPATIENT_CLINIC_OR_DEPARTMENT_OTHER): Payer: Self-pay

## 2022-06-12 NOTE — Telephone Encounter (Signed)
Post ED Visit - Positive Culture Follow-up  Culture report reviewed by antimicrobial stewardship pharmacist: [place] Team [x]$  Mal Misty Dohlen, Pharm.D. []$  Heide Guile, Pharm.D., BCPS AQ-ID []$  Parks Neptune, Pharm.D., BCPS []$  Alycia Rossetti, Pharm.D., BCPS []$  [place], Pharm.D., BCPS, AAHIVP []$  Legrand Como, Pharm.D., BCPS, AAHIVP []$  Salome Arnt, PharmD, BCPS []$  Johnnette Gourd, PharmD, BCPS []$  Hughes Better, PharmD, BCPS []$  Leeroy Cha, PharmD []$  Laqueta Linden, PharmD, BCPS []$  Albertina Parr, PharmD  [place] Team []$  Leodis Sias, PharmD []$  Lindell Spar, PharmD []$  Royetta Asal, PharmD []$  Graylin Shiver, Rph []$  Rema Fendt) Glennon Mac, PharmD []$  Arlyn Dunning, PharmD []$  Netta Cedars, PharmD []$  Dia Sitter, PharmD []$  Leone Haven, PharmD []$  Gretta Arab, PharmD []$  Theodis Shove, PharmD []$  Peggyann Juba, PharmD []$  Reuel Boom, PharmD   Positive blood culture Treated with Cefdinir and Zithromycin, organism sensitive to the same and no further patient follow-up is required at this time.  Glennon Hamilton 06/12/2022, 11:06 AM

## 2022-06-12 NOTE — Discharge Summary (Addendum)
Physician Discharge Summary   Patient: Marilyn Fowler MRN: [id number] DOB: 1975/10/17  Admit date:     06/09/2022  Discharge date: 06/11/2022  Discharge Physician: Vernelle Emerald   PCP: [facility]   Recommendations at discharge:    Please take all prescribed medications as instructed including the remainder of your antibiotics You have also been prescribed a short course of steroids to assist with your shortness of breath and wheezing please take this medication to completion. Furthermore you have been prescribed as needed albuterol therapy, please use it every 4 hours as needed for bouts of wheezing and shortness of breath. Please follow-up with your primary care provider in 1 to 2 weeks May return to work on 2/19 without restriction. Advance diet and activity level as tolerated.  Discharge Diagnoses: Principal Problem:   Community acquired pneumonia Active Problems:   Reactive airway disease with wheezing with acute exacerbation   Alcohol abuse   Essential hypertension   Nicotine dependence, cigarettes, uncomplicated   Hypokalemia   Class 3 severe obesity due to excess calories with serious comorbidity and body mass index (BMI) of 50.0 to 59.9 in adult [facility])  Resolved Problems:   * No resolved hospital problems. *   Hospital Course: 47 y.o. female with medical history significant of alcohol abuse, anemia, anxiety, depression, bipolar 2 disorder, benzodiazepine abuse, alcohol induced fatty liver disease, hypertension, class 3 obesity presenting with a 1 1/2 week history of shortness of breath and wheezing.  Patient presented to [facility] ED on 2/9 with a 1 week history of intermittent fevers shortness of breath and cough.  On evaluation CT angiogram of the chest revealed no evidence of pneumonia or pulmonary embolism and patient was discharged home with azithromycin.  In the days that followed, the  patient was contacted to return to the emergency department after the  blood cultures obtained during the 2/9 evaluation preliminarily came back positive for Streptococcus.  The hospitalist group was then called to assess the patient for admission the hospital for continued symptoms and strep bacteremia.  Patient was placed on a regimen of intravenous antibiotics treating for suspected early pneumonia that was not identifiable on initial imaging.  Bronchodilator therapy and steroids were additionally added due to ongoing coarse breath sounds and wheezing throughout the hospitalization.  After hospitalization, final culture results revealed streptococcus parasanguinis.  Results were reviewed with Dr. Drucilla Schmidt with infectious disease.  He felt that the blood cultures were likely a contaminant and antibiotic therapy did not need to be directed with that in mind.  With the blood cultures felt to be contaminant, patient was transition to an oral regimen of cefdinir and azithromycin to complete a 5-day course of antibiotics.  Patient was additionally discharged with a short course of oral prednisone and albuterol at time of discharge.  Patient was discharged in improved and stable condition on 06/11/2012.         Consultants: None Procedures performed: None  Disposition: Home Diet recommendation:  Discharge Diet Orders (From admission, onward)     Start     Ordered   06/11/22 0000  Diet general        06/11/22 1629           Regular diet  DISCHARGE MEDICATION: Allergies as of 06/11/2022       Reactions   Robaxin [methocarbamol] Other (See Comments)   Agitated    Toradol [ketorolac Tromethamine] Other (See Comments)   Pt states it makes her jittery, felt she had something  crawling on her    Trazodone And Nefazodone Other (See Comments)   Nightmare        Medication List     STOP taking these medications    amoxicillin-clavulanate 500-125 MG tablet Commonly known as: AUGMENTIN       TAKE these medications    albuterol 108 (90 Base) MCG/ACT  inhaler Commonly known as: VENTOLIN HFA Inhale 2 puffs into the lungs every 4 (four) hours as needed for wheezing or shortness of breath.   amLODipine 2.5 MG tablet Commonly known as: NORVASC Take 1 tablet (2.5 mg total) by mouth daily.   azithromycin 500 MG tablet Commonly known as: Zithromax Take 1 tablet (500 mg total) by mouth daily for 3 days. Take 1 tablet daily for 3 days. What changed:  medication strength how much to take additional instructions   BIOTIN 5000 PO Take 5,000 mcg by mouth daily.   Black Cohosh 450 MG Caps Take 450 mg by mouth daily.   calcium carbonate 1250 (500 Ca) MG tablet Commonly known as: OS-CAL - dosed in mg of elemental calcium Take 1 tablet by mouth daily with breakfast.   cefdinir 300 MG capsule Commonly known as: OMNICEF Take 1 capsule (300 mg total) by mouth 2 (two) times daily. First dose 2/14.   citalopram 20 MG tablet Commonly known as: CELEXA Take 20 mg by mouth daily.   clonazePAM 1 MG tablet Commonly known as: KLONOPIN Take 1 mg by mouth 3 (three) times daily as needed for anxiety.   cyclobenzaprine 10 MG tablet Commonly known as: FLEXERIL Take 10 mg by mouth 3 (three) times daily.   diphenhydrAMINE 50 MG capsule Commonly known as: BENADRYL Take 50 mg by mouth every 8 (eight) hours as needed for sleep.   ferrous sulfate 325 (65 FE) MG tablet Take 1 tablet (325 mg total) by mouth 3 (three) times daily with meals. What changed: when to take this   ibuprofen 200 MG tablet Commonly known as: ADVIL Take 400 mg by mouth every 4 (four) hours as needed for headache (pain).   metoprolol tartrate 25 MG tablet Commonly known as: LOPRESSOR Take 25 mg by mouth daily.   multivitamin tablet Take 1 tablet by mouth daily.   omeprazole 20 MG capsule Commonly known as: PRILOSEC Take 20 mg by mouth daily.   ondansetron 4 MG disintegrating tablet Commonly known as: Zofran ODT Take 1 tablet (4 mg total) by mouth every 8 (eight)  hours as needed for nausea or vomiting.   oxyCODONE-acetaminophen 7.5-325 MG tablet Commonly known as: PERCOCET Take 1 tablet by mouth every 8 (eight) hours as needed for severe pain.   phentermine 37.5 MG tablet Commonly known as: ADIPEX-P Take 37.5 mg by mouth daily.   predniSONE 20 MG tablet Commonly known as: DELTASONE Take 2 tablets (40 mg total) by mouth daily for 5 days.   promethazine 25 MG tablet Commonly known as: PHENERGAN Take 25 mg by mouth every 6 (six) hours as needed for nausea or vomiting.   Ubrelvy 100 MG Tabs Generic drug: Ubrogepant Take 100 mg by mouth daily as needed (migraine).   VITAMIN C PO Take 1 tablet by mouth daily.   VITAMIN D3 PO Take 2,000 Units by mouth daily.   zinc gluconate 50 MG tablet Take 50 mg by mouth daily.   zolpidem 10 MG tablet Commonly known as: AMBIEN Take 10 mg by mouth at bedtime.        [place] Follow up in 1 week(s).   Contact information: [place] [id number] [phone]                 Discharge Exam: Danley Danker Weights   06/09/22 1201 06/09/22 1500  Weight: 115.7 kg 118.2 kg    Constitutional: Awake alert and oriented x3, no associated distress.   Respiratory: Somewhat coarse breath sounds with scattered rhonchi bilaterally.  Remittent mild expiratory wheezing.   no crackles. Normal respiratory effort. No accessory muscle use.  Cardiovascular: Regular rate and rhythm, no murmurs / rubs / gallops. No extremity edema. 2+ pedal pulses. No carotid bruits.  Abdomen: Abdomen is soft and nontender.  No evidence of intra-abdominal masses.  Positive bowel sounds noted in all quadrants.   Musculoskeletal: No joint deformity upper and lower extremities. Good ROM, no contractures. Normal muscle tone.     Condition at discharge: fair  The results of significant diagnostics from this hospitalization (including imaging, microbiology, ancillary and  laboratory) are listed below for reference.   Imaging Studies: ECHOCARDIOGRAM COMPLETE  Result Date: 06/10/2022    ECHOCARDIOGRAM REPORT   Patient Name:   SOMIA MAXIN Date of Exam: 06/10/2022 Medical Rec #:  [id number]      Height:       60.0 in Accession #:    [id number]     Weight:       260.6 lb Date of Birth:  [birth year]      BSA:          2.089 m Patient Age:    47 years       BP:           138/77 mmHg Patient Gender: F              HR:           102 bpm. Exam Location:  Inpatient Procedure: 2D Echo, Cardiac Doppler and Color Doppler Indications:    Bacteremia R78.81  History:        Patient has no prior history of Echocardiogram examinations.                 Risk Factors:Hypertension. Streptococcal bacteremia. Acute                 bronchitis.  Sonographer:    [name] RDCS Referring Phys: [id number] DAVID MANUEL Cody  Sonographer Comments: Suboptimal parasternal window. IMPRESSIONS  1. Left ventricular ejection fraction, by estimation, is 55 to 60%. Left ventricular ejection fraction by 3D volume is 57 %. The left ventricle has normal function. The left ventricle has no regional wall motion abnormalities. There is mild asymmetric left ventricular hypertrophy of the basal-septal segment. Left ventricular diastolic parameters are consistent with Grade II diastolic dysfunction (pseudonormalization). The average left ventricular global longitudinal strain is -21.6 %. The global longitudinal strain is normal.  2. Right ventricular systolic function is normal. The right ventricular size is normal. There is normal pulmonary artery systolic pressure.  3. The mitral valve is grossly normal. Trivial mitral valve regurgitation. No evidence of mitral stenosis.  4. The aortic valve is tricuspid. Aortic valve regurgitation is not visualized. No aortic stenosis is present.  5. The inferior vena cava is normal in size with greater than 50% respiratory variability, suggesting right atrial pressure of 3 mmHg.  Comparison(s): No prior Echocardiogram. Conclusion(s)/Recommendation(s): No evidence of valvular vegetations on this transthoracic echocardiogram. Consider a transesophageal echocardiogram to exclude infective endocarditis if clinically indicated. FINDINGS  Left Ventricle: Left  ventricular ejection fraction, by estimation, is 55 to 60%. Left ventricular ejection fraction by 3D volume is 57 %. The left ventricle has normal function. The left ventricle has no regional wall motion abnormalities. The average left ventricular global longitudinal strain is -21.6 %. The global longitudinal strain is normal. The left ventricular internal cavity size was normal in size. There is mild asymmetric left ventricular hypertrophy of the basal-septal segment. Left ventricular diastolic parameters are consistent with Grade II diastolic dysfunction (pseudonormalization). Right Ventricle: The right ventricular size is normal. No increase in right ventricular wall thickness. Right ventricular systolic function is normal. There is normal pulmonary artery systolic pressure. The tricuspid regurgitant velocity is 2.21 m/s, and  with an assumed right atrial pressure of 3 mmHg, the estimated right ventricular systolic pressure is [id number] mmHg. Left Atrium: Left atrial size was normal in size. Right Atrium: Right atrial size was normal in size. Pericardium: There is no evidence of pericardial effusion. Mitral Valve: The mitral valve is grossly normal. Trivial mitral valve regurgitation. No evidence of mitral valve stenosis. Tricuspid Valve: The tricuspid valve is grossly normal. Tricuspid valve regurgitation is trivial. No evidence of tricuspid stenosis. Aortic Valve: The aortic valve is tricuspid. Aortic valve regurgitation is not visualized. No aortic stenosis is present. Pulmonic Valve: The pulmonic valve was grossly normal. Pulmonic valve regurgitation is not visualized. No evidence of pulmonic stenosis. Aorta: The aortic root, ascending  aorta, aortic arch and descending aorta are all structurally normal, with no evidence of dilitation or obstruction. Venous: The inferior vena cava is normal in size with greater than 50% respiratory variability, suggesting right atrial pressure of 3 mmHg. IAS/Shunts: No atrial level shunt detected by color flow Doppler.  LEFT VENTRICLE PLAX 2D LVIDd:         3.10 cm         Diastology LVIDs:         2.40 cm         LV e' medial:    9.46 cm/s LV PW:         1.00 cm         LV E/e' medial:  11.0 LV IVS:        1.30 cm         LV e' lateral:   10.40 cm/s LVOT diam:     2.00 cm         LV E/e' lateral: 10.0 LV SV:         66 LV SV Index:   31              2D LVOT Area:     3.14 cm        Longitudinal                                Strain                                2D Strain GLS  -20.1 %                                (A2C):                                2D Strain GLS  -23.9 %                                (  A3C):                                2D Strain GLS  -20.7 %                                (A4C):                                2D Strain GLS  -21.6 %                                Avg:                                 3D Volume EF                                LV 3D EF:    Left                                             ventricul                                             ar                                             ejection                                             fraction                                             by 3D                                             volume is                                             57 %.                                 3D Volume EF:                                3D EF:  57 %                                LV EDV:       134 ml                                LV ESV:       58 ml                                LV SV:        76 ml RIGHT VENTRICLE RV S prime:     14.30 cm/s TAPSE (M-mode): 2.4 cm LEFT ATRIUM             Index        RIGHT ATRIUM          Index LA  diam:        2.70 cm 1.29 cm/m   RA Area:     9.48 cm LA Vol (A2C):   44.7 ml 21.40 ml/m  RA Volume:   18.00 ml 8.62 ml/m LA Vol (A4C):   29.3 ml 14.03 ml/m LA Biplane Vol: 36.5 ml 17.47 ml/m  AORTIC VALVE LVOT Vmax:   101.00 cm/s LVOT Vmean:  72.300 cm/s LVOT VTI:    0.209 m  AORTA Ao Root diam: 3.10 cm Ao Asc diam:  3.20 cm MITRAL VALVE                TRICUSPID VALVE MV Area (PHT): 3.30 cm     TR Peak grad:   19.5 mmHg MV Decel Time: 230 msec     TR Vmax:        221.00 cm/s MV E velocity: 104.00 cm/s MV A velocity: 95.70 cm/s   SHUNTS MV E/A ratio:  1.09         Systemic VTI:  0.21 m                             Systemic Diam: 2.00 cm Buford Dresser MD Electronically signed by Buford Dresser MD Signature Date/Time: 06/10/2022/3:27:13 PM    Final    DG Chest Portable 1 View  Result Date: 06/09/2022 CLINICAL DATA:  Short of breath.  Positive blood cultures. EXAM: PORTABLE CHEST 1 VIEW COMPARISON:  06/07/2022 and older exams. FINDINGS: Cardiac silhouette normal in size. No mediastinal or hilar masses. Clear lungs. No convincing pleural effusion. No pneumothorax. Skeletal structures are grossly intact. IMPRESSION: No active disease. Electronically Signed   By: Lajean Manes M.D.   On: 06/09/2022 13:24   CT Angio Chest PE W/Cm &/Or Wo Cm  Result Date: 06/07/2022 CLINICAL DATA:  Per pt's Mother pt has been sick all week with "a cold" Pt demonstrating tachypnea and can not speak in full sentences. C/o SHOB and CP EXAM: CT ANGIOGRAPHY CHEST WITH CONTRAST TECHNIQUE: Multidetector CT imaging of the chest was performed using the standard protocol during bolus administration of intravenous contrast. Multiplanar CT image reconstructions and MIPs were obtained to evaluate the vascular anatomy. RADIATION DOSE REDUCTION: This exam was performed according to the departmental dose-optimization program which includes automated exposure control, adjustment of the mA and/or kV according to patient size and/or  use of iterative reconstruction technique. CONTRAST:  117m OMNIPAQUE IOHEXOL 350 MG/ML  SOLN COMPARISON:  Chest x-ray 06/07/2022 FINDINGS: Cardiovascular: Satisfactory opacification of the pulmonary arteries to the segmental level. No evidence of pulmonary embolism. Normal heart size. No significant pericardial effusion. The thoracic aorta is normal in caliber. No atherosclerotic plaque of the thoracic aorta. No coronary artery calcifications. Mediastinum/Nodes: No enlarged mediastinal, hilar, or axillary lymph nodes. Thyroid gland, trachea, and esophagus demonstrate no significant findings. Lungs/Pleura: No focal consolidation. No pulmonary nodule. No pulmonary mass. No pleural effusion. No pneumothorax. Upper Abdomen: No acute abnormality. Musculoskeletal: No chest wall abnormality. No suspicious lytic or blastic osseous lesions. No acute displaced fracture. Review of the MIP images confirms the above findings. IMPRESSION: 1. No pulmonary embolus. 2. No acute intrathoracic abnormality. Electronically Signed   By: Iven Finn M.D.   On: 06/07/2022 01:06   DG Chest Port 1 View  Result Date: 06/07/2022 CLINICAL DATA:  Shortness of breath, chest pain EXAM: PORTABLE CHEST 1 VIEW COMPARISON:  11/25/2020 FINDINGS: Heart and mediastinal contours are within normal limits. No focal opacities or effusions. No acute bony abnormality. IMPRESSION: No active disease. Electronically Signed   By: Rolm Baptise M.D.   On: 06/07/2022 00:25    Microbiology: Results for orders placed or performed during the hospital encounter of 06/09/22  Blood culture (routine x 2)     Status: None (Preliminary result)   Collection Time: 06/09/22 12:09 PM   Specimen: BLOOD  Result Value Ref Range Status   Specimen Description   Final    BLOOD LEFT ANTECUBITAL Performed at [facility] [address], [place], [place] [address]    Special Requests   Final    BOTTLES DRAWN AEROBIC AND ANAEROBIC Blood Culture  adequate volume Performed at [facility] [address] [place], [place] [address]    Culture   Final    NO GROWTH 2 DAYS Performed at [facility] [address], [place], [place] [address]    Report Status PENDING  Incomplete  Blood culture (routine x 2)     Status: None (Preliminary result)   Collection Time: 06/09/22 12:14 PM   Specimen: BLOOD  Result Value Ref Range Status   Specimen Description   Final    BLOOD BLOOD RIGHT HAND Performed at [facility] [address], [place], [place] [address]    Special Requests   Final    BOTTLES DRAWN AEROBIC AND ANAEROBIC Blood Culture adequate volume Performed at [facility] [address], [place], [place] [address]    Culture   Final    NO GROWTH 2 DAYS Performed at [facility] [address], [place], [place] [address]    Report Status PENDING  Incomplete    Labs: CBC: Recent Labs  Lab 06/07/22 0002 06/07/22 0009 06/09/22 1209 06/10/22 0452 06/11/22 0711  WBC  --  13.3* 7.6 7.3 6.2  NEUTROABS  --  7.7  --   --  3.0  HGB 16.0* 15.6* 14.2 12.6 13.1  HCT 47.0* 45.2 43.3 38.4 40.1  MCV  --  83.9 89.3 89.5 90.5  PLT  --  496* 335 314 [id number]   Basic Metabolic Panel: Recent Labs  Lab 06/07/22 0002 06/07/22 0009 06/09/22 1209 06/10/22 0452 06/11/22 0711  NA 142 136 138 135 138  K 3.3* 3.1* 3.4* 4.6 4.0  CL  --  107 112* 110 107  CO2  --  17* 19* 18* 21*  GLUCOSE  --  127* 87 137* 126*  BUN  --  12 8 7 8  $ CREATININE  --  0.98 0.74 0.57 0.77  CALCIUM  --  9.4 8.9 8.5* 9.0  MG  --   --  1.8  --  2.1  PHOS  --   --  3.4  --   --    Liver Function Tests: Recent Labs  Lab 06/07/22 0009 06/09/22 1209 06/11/22 0711  AST 40 38 29  ALT 22 25 21  $ ALKPHOS 90 85 78  BILITOT 0.8 0.5 0.4  PROT 8.3* 7.6 7.0  ALBUMIN 4.7 4.3 4.2   CBG: No results for input(s): "GLUCAP" in the last 168 hours.  Discharge time spent: greater than 30  minutes.  Signed: Vernelle Emerald, MD [facility] 06/12/2022

## 2022-06-14 LAB — CULTURE, BLOOD (ROUTINE X 2)
Culture: NO GROWTH
Culture: NO GROWTH
Special Requests: ADEQUATE
Special Requests: ADEQUATE

## 2022-08-07 ENCOUNTER — Emergency Department (HOSPITAL_BASED_OUTPATIENT_CLINIC_OR_DEPARTMENT_OTHER)
Admission: EM | Admit: 2022-08-07 | Discharge: 2022-08-07 | Disposition: A | Discharge status: Home / Self Care | Payer: No Typology Code available for payment source | Attending: Emergency Medicine | Admitting: Emergency Medicine

## 2022-08-07 ENCOUNTER — Emergency Department (HOSPITAL_BASED_OUTPATIENT_CLINIC_OR_DEPARTMENT_OTHER): Payer: No Typology Code available for payment source

## 2022-08-07 ENCOUNTER — Encounter (HOSPITAL_BASED_OUTPATIENT_CLINIC_OR_DEPARTMENT_OTHER): Payer: Self-pay

## 2022-08-07 DIAGNOSIS — W01198A Fall on same level from slipping, tripping and stumbling with subsequent striking against other object, initial encounter: Secondary | ICD-10-CM | POA: Diagnosis not present

## 2022-08-07 DIAGNOSIS — F17200 Nicotine dependence, unspecified, uncomplicated: Secondary | ICD-10-CM | POA: Diagnosis not present

## 2022-08-07 DIAGNOSIS — Z79899 Other long term (current) drug therapy: Secondary | ICD-10-CM | POA: Insufficient documentation

## 2022-08-07 DIAGNOSIS — R1084 Generalized abdominal pain: Secondary | ICD-10-CM

## 2022-08-07 DIAGNOSIS — R7309 Other abnormal glucose: Secondary | ICD-10-CM | POA: Diagnosis not present

## 2022-08-07 DIAGNOSIS — K5900 Constipation, unspecified: Secondary | ICD-10-CM | POA: Diagnosis not present

## 2022-08-07 DIAGNOSIS — I10 Essential (primary) hypertension: Secondary | ICD-10-CM | POA: Insufficient documentation

## 2022-08-07 DIAGNOSIS — E876 Hypokalemia: Secondary | ICD-10-CM | POA: Diagnosis not present

## 2022-08-07 DIAGNOSIS — R0602 Shortness of breath: Secondary | ICD-10-CM | POA: Diagnosis not present

## 2022-08-07 DIAGNOSIS — W19XXXA Unspecified fall, initial encounter: Secondary | ICD-10-CM

## 2022-08-07 DIAGNOSIS — S0990XA Unspecified injury of head, initial encounter: Secondary | ICD-10-CM | POA: Insufficient documentation

## 2022-08-07 DIAGNOSIS — R0789 Other chest pain: Secondary | ICD-10-CM | POA: Insufficient documentation

## 2022-08-07 LAB — BASIC METABOLIC PANEL
Anion gap: 9 (ref 5–15)
BUN: 14 mg/dL (ref 6–20)
CO2: 27 mmol/L (ref 22–32)
Calcium: 8.9 mg/dL (ref 8.9–10.3)
Chloride: 103 mmol/L (ref 98–111)
Creatinine, Ser: 0.86 mg/dL (ref 0.44–1.00)
GFR, Estimated: >60 mL/min (ref >60)
Glucose, Bld: 114 mg/dL — ABNORMAL HIGH (ref 70–99)
Potassium: 3.3 mmol/L — ABNORMAL LOW (ref 3.5–5.1)
Sodium: 139 mmol/L (ref 135–145)

## 2022-08-07 LAB — CBC
HCT: 39.4 % (ref 36.0–46.0)
Hemoglobin: 12.7 g/dL (ref 12.0–15.0)
MCH: 28.2 pg (ref 26.0–34.0)
MCHC: 32.2 g/dL (ref 30.0–36.0)
MCV: 87.4 fL (ref 80.0–100.0)
Platelets: 377 10*3/uL (ref 150–400)
RBC: 4.51 MIL/uL (ref 3.87–5.11)
RDW: 14 % (ref 11.5–15.5)
WBC: 8.1 10*3/uL (ref 4.0–10.5)
nRBC: 0 % (ref 0.0–0.2)

## 2022-08-07 LAB — HEPATIC FUNCTION PANEL
ALT: 60 U/L — ABNORMAL HIGH (ref 0–44)
AST: 52 U/L — ABNORMAL HIGH (ref 15–41)
Albumin: 4.1 g/dL (ref 3.5–5.0)
Alkaline Phosphatase: 91 U/L (ref 38–126)
Bilirubin, Direct: <0.1 mg/dL (ref 0.0–0.2)
Total Bilirubin: 0.4 mg/dL (ref 0.3–1.2)
Total Protein: 7.3 g/dL (ref 6.5–8.1)

## 2022-08-07 LAB — ETHANOL: Alcohol, Ethyl (B): <10 mg/dL (ref <10)

## 2022-08-07 LAB — LIPASE, BLOOD: Lipase: 25 U/L (ref 11–51)

## 2022-08-07 LAB — PREGNANCY, URINE: Preg Test, Ur: NEGATIVE

## 2022-08-07 LAB — TROPONIN I (HIGH SENSITIVITY): Troponin I (High Sensitivity): 3 ng/L (ref <18)

## 2022-08-07 MED ORDER — IOHEXOL 300 MG/ML  SOLN
100.0000 mL | Freq: Once | INTRAMUSCULAR | Status: AC | PRN
  Administered 2022-08-07: 100 mL via INTRAVENOUS

## 2022-08-07 MED ORDER — PANTOPRAZOLE SODIUM 40 MG IV SOLR
40.0000 mg | Freq: Once | INTRAVENOUS | Status: AC
  Administered 2022-08-07: 40 mg via INTRAVENOUS
  Filled 2022-08-07: qty 10

## 2022-08-07 MED ORDER — ONDANSETRON HCL 4 MG/2ML IJ SOLN
4.0000 mg | Freq: Once | INTRAMUSCULAR | Status: AC
  Administered 2022-08-07: 4 mg via INTRAVENOUS
  Filled 2022-08-07: qty 2

## 2022-08-07 MED ORDER — SODIUM CHLORIDE 0.9 % IV SOLN: INTRAVENOUS | Status: DC

## 2022-08-07 NOTE — ED Triage Notes (Addendum)
C/o chest pain & SOB intermittently x 1 year, states hadnt been seen for it since she didn't have insurance. Also fell and hit her head 2 days. States has lost a lot of weight recently because she's been vomiting. Also states rectal bleeding

## 2022-08-07 NOTE — ED Notes (Signed)
Discharge paperwork reviewed entirely with patient, including Rx's and follow up care. Pain was under control. Pt verbalized understanding as well as all parties involved. No questions or concerns voiced at the time of discharge. No acute distress noted.   Pt ambulated out to PVA without incident or assistance.  

## 2022-08-07 NOTE — ED Provider Notes (Addendum)
[place] [facility]GENC[facility] DEPARTMENT AT [facility] Provider Note   CSN: [id number] Arrival date & time: 08/07/22  0725     History  Chief Complaint  Patient presents with   Chest Pain    Marilyn Fowler is a 47 y.o. female.  Patient presenting with notable complaints.  Patient was recently admitted in February February 11 through February 13 for community-acquired pneumonia reactive airway disease alcohol abuse and hypertension.  Patient states that she is continuing to have chest pain with breathing.  Intermittently since before February.  Also abdominal pain for 2 weeks associated with mostly nausea rare vomiting.  I and has had persistent blood in her bowel movements.  But not staining the toilet water all red.  Usually just more passage of blood clots.  In addition patient states she has had unexpected weight loss without trying.  And patient said that there was a fall few days ago and hit her head.  Past medical history significant for history of hemorrhoids history of migraines history of anemia hypertension alcohol abuse.  Patient's had ventral hernia repair and hemorrhoid surgery.  Patient is an everyday's smoker.       Home Medications Prior to Admission medications   Medication Sig Start Date End Date Taking? Authorizing Provider  albuterol (VENTOLIN HFA) 108 (90 Base) MCG/ACT inhaler Inhale 2 puffs into the lungs every 4 (four) hours as needed for wheezing or shortness of breath. 06/11/22  Yes Shalhoub, Deno Lunger, MD  amLODipine (NORVASC) 2.5 MG tablet Take 1 tablet (2.5 mg total) by mouth daily. 01/19/20  Yes Clapacs, Jackquline Denmark, MD  Ascorbic Acid (VITAMIN C PO) Take 1 tablet by mouth daily.   Yes [provider]  BIOTIN 5000 PO Take 5,000 mcg by mouth daily.   Yes [provider]  Black Cohosh 450 MG CAPS Take 450 mg by mouth daily.   Yes [provider]  calcium carbonate (OS-CAL - DOSED IN MG OF ELEMENTAL CALCIUM) 1250 (500 Ca) MG tablet  Take 1 tablet by mouth daily with breakfast.   Yes [provider]  Cholecalciferol (VITAMIN D3 PO) Take 2,000 Units by mouth daily.   Yes [provider]  citalopram (CELEXA) 20 MG tablet Take 20 mg by mouth daily.   Yes [provider]  clonazePAM (KLONOPIN) 1 MG tablet Take 1 mg by mouth 3 (three) times daily as needed for anxiety. 05/18/22  Yes [provider]  cyclobenzaprine (FLEXERIL) 10 MG tablet Take 10 mg by mouth 3 (three) times daily. 05/17/22  Yes [provider]  diphenhydrAMINE (BENADRYL) 50 MG capsule Take 50 mg by mouth at bedtime.   Yes [provider]  ferrous sulfate 325 (65 FE) MG tablet Take 1 tablet (325 mg total) by mouth 3 (three) times daily with meals. Patient taking differently: Take 325 mg by mouth daily with breakfast. 11/25/20 08/07/22 Yes Koleen Distance, MD  ibuprofen (ADVIL) 200 MG tablet Take 400 mg by mouth every 4 (four) hours as needed for headache (pain).   Yes [provider]  metoprolol tartrate (LOPRESSOR) 25 MG tablet Take 25 mg by mouth every evening.   Yes [provider]  Multiple Vitamin (MULTIVITAMIN) tablet Take 1 tablet by mouth daily.   Yes [provider]  omeprazole (PRILOSEC) 20 MG capsule Take 20 mg by mouth daily. 05/15/22  Yes [provider]  ondansetron (ZOFRAN-ODT) 8 MG disintegrating tablet Take 8 mg by mouth 2 (two) times daily. 07/23/22  Yes [provider]  oxyCODONE-acetaminophen (PERCOCET) 7.5-325 MG tablet Take 1 tablet by mouth every 8 (eight) hours as needed for severe pain.   Yes [provider]  pantoprazole (PROTONIX) 40 MG tablet Take 40 mg by mouth daily. 08/02/22  Yes [provider]  phentermine (ADIPEX-P) 37.5 MG tablet Take 37.5 mg by mouth daily. 06/02/22  Yes [provider]  promethazine (PHENERGAN) 25 MG tablet Take 25 mg by mouth every 6 (six) hours as needed for nausea or vomiting. 05/15/22  Yes [provider]  UBRELVY 100 MG TABS Take 100 mg by mouth daily as needed (migraine). 05/27/22  Yes [provider]  zinc gluconate 50 MG tablet Take 50 mg by mouth daily.   Yes [provider]  zolpidem (AMBIEN) 10 MG tablet Take 10 mg by mouth at bedtime. 05/18/22  Yes [provider]  cefdinir (OMNICEF) 300 MG capsule Take 1 capsule (300 mg total) by mouth 2 (two) times daily. First dose 2/14. Patient not taking: Reported on 08/07/2022 06/12/22   Marinda Elk, MD  buPROPion (WELLBUTRIN XL) 300 MG 24 hr tablet Take 1 tablet (300 mg total) by mouth daily. 04/03/20 04/03/20  Geoffery Lyons, MD      Allergies    Robaxin [methocarbamol], Toradol [ketorolac tromethamine], and Trazodone and nefazodone    Review of Systems   Review of Systems  Constitutional:  Positive for unexpected weight change. Negative for chills and fever.  HENT:  Negative for ear pain and sore throat.   Eyes:  Negative for pain and visual disturbance.  Respiratory:  Positive for shortness of breath. Negative for cough.   Cardiovascular:  Positive for chest pain. Negative for palpitations.  Gastrointestinal:  Positive for abdominal pain, blood in stool and nausea. Negative for vomiting.  Genitourinary:  Negative for dysuria and hematuria.  Musculoskeletal:  Negative for arthralgias and back pain.  Skin:  Negative for color change and rash.  Neurological:  Negative for seizures and syncope.  All other systems reviewed and are negative.   Physical Exam Updated Vital Signs BP (!) 140/89   Pulse 99   Temp 98.6 F (37 C) (Oral)   Resp 17   Wt 118.7 kg   LMP 07/29/2022   SpO2 99%   BMI 51.11 kg/m  Physical Exam Vitals and nursing note reviewed.  Constitutional:      General: She is not in acute distress.    Appearance: Normal appearance. She is well-developed.  HENT:     Head: Normocephalic and atraumatic.  Eyes:     Extraocular Movements: Extraocular movements intact.      Conjunctiva/sclera: Conjunctivae normal.     Pupils: Pupils are equal, round, and reactive to light.  Cardiovascular:     Rate and Rhythm: Normal rate and regular rhythm.     Heart sounds: No murmur heard. Pulmonary:     Effort: Pulmonary effort is normal. No respiratory distress.     Breath sounds: Normal breath sounds. No wheezing or rales.  Abdominal:     Palpations: Abdomen is soft.     Tenderness: There is abdominal tenderness. There is no guarding.     Comments: Mild diffuse tenderness no guarding  Musculoskeletal:        General: No swelling.     Cervical back: Normal range of motion and neck supple.     Right lower leg: No edema.     Left lower leg: No edema.  Skin:    General: Skin is warm and dry.  Capillary Refill: Capillary refill takes less than 2 seconds.  Neurological:     General: No focal deficit present.     Mental Status: She is alert and oriented to person, place, and time.  Psychiatric:        Mood and Affect: Mood normal.     ED Results / Procedures / Treatments   Labs (all labs ordered are listed, but only abnormal results are displayed) Labs Reviewed  BASIC METABOLIC PANEL - Abnormal; Notable for the following components:      Result Value   Potassium 3.3 (*)    Glucose, Bld 114 (*)    All other components within normal limits  CBC  PREGNANCY, URINE  LIPASE, BLOOD  ETHANOL  HEPATIC FUNCTION PANEL  TROPONIN I (HIGH SENSITIVITY)  TROPONIN I (HIGH SENSITIVITY)    EKG EKG Interpretation  Date/Time:  Wednesday August 07 2022 07:33:13 EDT Ventricular Rate:  111 PR Interval:  77 QRS Duration: 86 QT Interval:  344 QTC Calculation: 468 R Axis:   -24 Text Interpretation: Sinus tachycardia Ventricular premature complex Aberrant conduction of SV complex(es) Borderline left axis deviation Low voltage, precordial leads No significant change since last tracing Confirmed by Vanetta MuldersZackowski, Shaylyn Bawa [phone]([id number]) on 08/07/2022 7:50:49 AM  Radiology CT Abdomen  Pelvis W Contrast  Result Date: 08/07/2022 CLINICAL DATA:  Abdominal pain EXAM: CT ABDOMEN AND PELVIS WITH CONTRAST TECHNIQUE: Multidetector CT imaging of the abdomen and pelvis was performed using the standard protocol following bolus administration of intravenous contrast. RADIATION DOSE REDUCTION: This exam was performed according to the departmental dose-optimization program which includes automated exposure control, adjustment of the mA and/or kV according to patient size and/or use of iterative reconstruction technique. CONTRAST:  100mL OMNIPAQUE IOHEXOL 300 MG/ML  SOLN COMPARISON:  04/29/2016 FINDINGS: Lower chest: No acute abnormality. No pleural or pericardial effusion. Hepatobiliary: No focal liver abnormality is seen. No gallstones, gallbladder wall thickening, or biliary dilatation. Pancreas: Unremarkable. No pancreatic ductal dilatation or surrounding inflammatory changes. Spleen: Normal in size without focal abnormality. Adrenals/Urinary Tract: Adrenal glands are unremarkable. Kidneys are normal, without renal calculi, focal lesion, or hydronephrosis. Bladder is unremarkable. Stomach/Bowel: Stomach is within normal limits. Appendix appears normal. No evidence of bowel wall thickening, distention, or inflammatory changes. Increased stool consistent with constipation. Vascular/Lymphatic: Aortic atherosclerosis. No enlarged abdominal or pelvic lymph nodes. Reproductive: Uterus and bilateral adnexa are unremarkable. Other: No abdominal wall hernia or abnormality. No abdominopelvic ascites. Musculoskeletal: No acute or significant osseous findings. Lumbosacral degenerative changes are noted. IMPRESSION: Constipation. Otherwise no acute abdominal or pelvic pathology identified. Electronically Signed   By: Layla MawJoshua  Pleasure M.D.   On: 08/07/2022 08:59   CT Head Wo Contrast  Result Date: 08/07/2022 CLINICAL DATA:  Head trauma.  Moderate to severe.  Fall 2 days ago. EXAM: CT HEAD WITHOUT CONTRAST  TECHNIQUE: Contiguous axial images were obtained from the base of the skull through the vertex without intravenous contrast. RADIATION DOSE REDUCTION: This exam was performed according to the departmental dose-optimization program which includes automated exposure control, adjustment of the mA and/or kV according to patient size and/or use of iterative reconstruction technique. COMPARISON:  03/03/2020 FINDINGS: Brain: No acute infarct, hemorrhage, or mass lesion is present. No significant white matter lesions are present. Deep brain nuclei are within normal limits. The ventricles are of normal size. No significant extraaxial fluid collection is present. The brainstem and cerebellum are within normal limits. Midline structures are within normal limits. Vascular: No hyperdense vessel or unexpected calcification. Skull: Calvarium is intact.  No focal lytic or blastic lesions are present. Sinuses/Orbits: The paranasal sinuses and mastoid air cells are clear. The globes and orbits are within normal limits. IMPRESSION: Negative CT of the head. Electronically Signed   By: Marin Roberts M.D.   On: 08/07/2022 08:50   DG Chest 2 View  Result Date: 08/07/2022 CLINICAL DATA:  Provided history: Shortness of breath.  Chest pain. EXAM: CHEST - 2 VIEW COMPARISON:  Prior chest radiographs 06/09/2022 and earlier. FINDINGS: Heart size within normal limits. Small focus of linear atelectasis or scarring within the lateral left lung base. No appreciable airspace consolidation. No evidence of pleural effusion or pneumothorax. No acute osseous abnormality identified. Degenerative changes of the spine. IMPRESSION: 1. Small focus of linear atelectasis or scarring within the lateral left lung base. 2. Otherwise, no evidence of acute cardiopulmonary abnormality. Electronically Signed   By: Jackey Loge D.O.   On: 08/07/2022 08:17    Procedures Procedures    Medications Ordered in ED Medications  0.9 %  sodium chloride  infusion ( Intravenous New Bag/Given 08/07/22 0849)  ondansetron (ZOFRAN) injection 4 mg (4 mg Intravenous Given 08/07/22 0851)  pantoprazole (PROTONIX) injection 40 mg (40 mg Intravenous Given 08/07/22 0850)  iohexol (OMNIPAQUE) 300 MG/ML solution 100 mL (100 mLs Intravenous Contrast Given 08/07/22 0830)    ED Course/ Medical Decision Making/ A&P                             Medical Decision Making Amount and/or Complexity of Data Reviewed Labs: ordered. Radiology: ordered.  Risk Prescription drug management.  Workup hepatic function panel still pending.  Basic metabolic panel significant for some mild hypokalemia with a potassium of 3.3 glucose 114 renal function normal.  Alcohol less than 10.  CBC no leukocytosis hemoglobin good at 12.7 platelets 377.  Patient did have a history of some blood in her bowel movements and will need follow-up with a colonoscopy for that.  Patient's initial troponin was 3 which is reassuring since this is been ongoing chest pain for a while.  Lipase also normal at 25.  Pregnancy test negative.  Hepatic Function panel without any acute findings.  AST ALT up just slightly.  But total bili and alk phos normal.  CT abdomen and pelvis without any acute findings other than constipation.  CT head for the fall that occurred was negative.  And chest x-ray without any acute findings.  Final Clinical Impression(s) / ED Diagnoses Final diagnoses:  Atypical chest pain  Shortness of breath  Generalized abdominal pain  Fall, initial encounter  Injury of head, initial encounter  Constipation, unspecified constipation type    Rx / DC Orders ED Discharge Orders     None         Vanetta Mulders, MD 08/07/22 9179    Vanetta Mulders, MD 08/07/22 1110

## 2022-08-07 NOTE — Discharge Instructions (Addendum)
Recommend over-the-counter MiraLAX for the constipation would take it once or twice a day for 7 days.  Make an appointment follow-up with your [facility] medical clinic for consideration for colonoscopy.  Referral information provided for our on-call gastroenterology service if you want to call and get an appointment.  Workup otherwise without any acute findings.  Which is very reassuring.

## 2022-08-22 ENCOUNTER — Other Ambulatory Visit: Payer: Self-pay

## 2022-08-22 ENCOUNTER — Emergency Department (HOSPITAL_BASED_OUTPATIENT_CLINIC_OR_DEPARTMENT_OTHER)
Admission: EM | Admit: 2022-08-22 | Discharge: 2022-08-22 | Disposition: A | Discharge status: Home / Self Care | Payer: No Typology Code available for payment source | Attending: Emergency Medicine | Admitting: Emergency Medicine

## 2022-08-22 ENCOUNTER — Encounter (HOSPITAL_BASED_OUTPATIENT_CLINIC_OR_DEPARTMENT_OTHER): Payer: Self-pay | Admitting: Pediatrics

## 2022-08-22 ENCOUNTER — Emergency Department (HOSPITAL_BASED_OUTPATIENT_CLINIC_OR_DEPARTMENT_OTHER): Payer: No Typology Code available for payment source

## 2022-08-22 DIAGNOSIS — D649 Anemia, unspecified: Secondary | ICD-10-CM | POA: Insufficient documentation

## 2022-08-22 DIAGNOSIS — R0602 Shortness of breath: Secondary | ICD-10-CM | POA: Diagnosis not present

## 2022-08-22 DIAGNOSIS — R21 Rash and other nonspecific skin eruption: Secondary | ICD-10-CM | POA: Diagnosis not present

## 2022-08-22 DIAGNOSIS — R079 Chest pain, unspecified: Secondary | ICD-10-CM | POA: Diagnosis not present

## 2022-08-22 DIAGNOSIS — E871 Hypo-osmolality and hyponatremia: Secondary | ICD-10-CM | POA: Insufficient documentation

## 2022-08-22 DIAGNOSIS — R42 Dizziness and giddiness: Secondary | ICD-10-CM | POA: Insufficient documentation

## 2022-08-22 LAB — CBC
HCT: 35.1 % — ABNORMAL LOW (ref 36.0–46.0)
Hemoglobin: 11.3 g/dL — ABNORMAL LOW (ref 12.0–15.0)
MCH: 27.2 pg (ref 26.0–34.0)
MCHC: 32.2 g/dL (ref 30.0–36.0)
MCV: 84.4 fL (ref 80.0–100.0)
Platelets: 310 10*3/uL (ref 150–400)
RBC: 4.16 MIL/uL (ref 3.87–5.11)
RDW: 13.6 % (ref 11.5–15.5)
WBC: 6.8 10*3/uL (ref 4.0–10.5)
nRBC: 0 % (ref 0.0–0.2)

## 2022-08-22 LAB — BASIC METABOLIC PANEL
Anion gap: 10 (ref 5–15)
BUN: 9 mg/dL (ref 6–20)
CO2: 23 mmol/L (ref 22–32)
Calcium: 9.2 mg/dL (ref 8.9–10.3)
Chloride: 100 mmol/L (ref 98–111)
Creatinine, Ser: 0.66 mg/dL (ref 0.44–1.00)
GFR, Estimated: >60 mL/min (ref >60)
Glucose, Bld: 122 mg/dL — ABNORMAL HIGH (ref 70–99)
Potassium: 4 mmol/L (ref 3.5–5.1)
Sodium: 133 mmol/L — ABNORMAL LOW (ref 135–145)

## 2022-08-22 LAB — TROPONIN I (HIGH SENSITIVITY): Troponin I (High Sensitivity): 2 ng/L (ref <18)

## 2022-08-22 NOTE — Discharge Instructions (Signed)
I will provide a work note for you to state that you are cleared for work.  I have a low suspicion that this is contagious considering this has been ongoing for a year.  I would like for you to follow-up with dermatology.  You may need to call around to see which provider will take your insurance if you have any.  You may return to the emergency room at anytime for worsening symptoms.

## 2022-08-22 NOTE — ED Notes (Signed)
1 Set of blood cultures in lab if needed.

## 2022-08-22 NOTE — ED Provider Notes (Signed)
[place] [facility] AT [facility] Provider Note   CSN: [id number] Arrival date & time: 08/22/22  1227     History Chief Complaint  Patient presents with   Chest Pain   Shortness of Breath    Marilyn Fowler is a 47 y.o. female patient who presents to the emergency department today for further evaluation of an intermittent skin rash that has been ongoing for the last year.  Patient is requesting work clearance that she can continue going to work.  The rash is localized to the breasts and abdomen.  They are pruritic and she scratches them until they open.  She has been on steroids, antihistamines, and antibiotics.  She has not seen dermatology for this.  Patient currently declines any evidence of chest pain, shortness of breath, fever, chills, dizziness.  She has not had any chest pain or shortness of breath in the last 48 hours.   Chest Pain Associated symptoms: shortness of breath   Shortness of Breath Associated symptoms: chest pain        Home Medications Prior to Admission medications   Medication Sig Start Date End Date Taking? Authorizing Provider  albuterol (VENTOLIN HFA) 108 (90 Base) MCG/ACT inhaler Inhale 2 puffs into the lungs every 4 (four) hours as needed for wheezing or shortness of breath. 06/11/22   Shalhoub, Deno Lunger, MD  amLODipine (NORVASC) 2.5 MG tablet Take 1 tablet (2.5 mg total) by mouth daily. 01/19/20   Clapacs, Jackquline Denmark, MD  Ascorbic Acid (VITAMIN C PO) Take 1 tablet by mouth daily.    [provider]  BIOTIN 5000 PO Take 5,000 mcg by mouth daily.    [provider]  Black Cohosh 450 MG CAPS Take 450 mg by mouth daily.    [provider]  calcium carbonate (OS-CAL - DOSED IN MG OF ELEMENTAL CALCIUM) 1250 (500 Ca) MG tablet Take 1 tablet by mouth daily with breakfast.    [provider]  cefdinir (OMNICEF) 300 MG capsule Take 1 capsule (300 mg total) by mouth 2 (two) times daily. First dose  2/14. Patient not taking: Reported on 08/07/2022 06/12/22   Marinda Elk, MD  Cholecalciferol (VITAMIN D3 PO) Take 2,000 Units by mouth daily.    [provider]  citalopram (CELEXA) 20 MG tablet Take 20 mg by mouth daily.    [provider]  clonazePAM (KLONOPIN) 1 MG tablet Take 1 mg by mouth 3 (three) times daily as needed for anxiety. 05/18/22   [provider]  cyclobenzaprine (FLEXERIL) 10 MG tablet Take 10 mg by mouth 3 (three) times daily. 05/17/22   [provider]  diphenhydrAMINE (BENADRYL) 50 MG capsule Take 50 mg by mouth at bedtime.    [provider]  ferrous sulfate 325 (65 FE) MG tablet Take 1 tablet (325 mg total) by mouth 3 (three) times daily with meals. Patient taking differently: Take 325 mg by mouth daily with breakfast. 11/25/20 08/07/22  Koleen Distance, MD  ibuprofen (ADVIL) 200 MG tablet Take 400 mg by mouth every 4 (four) hours as needed for headache (pain).    [provider]  metoprolol tartrate (LOPRESSOR) 25 MG tablet Take 25 mg by mouth every evening.    [provider]  Multiple Vitamin (MULTIVITAMIN) tablet Take 1 tablet by mouth daily.    [provider]  omeprazole (PRILOSEC) 20 MG capsule Take 20 mg by mouth daily. 05/15/22   [provider]  ondansetron (ZOFRAN-ODT) 8 MG disintegrating  tablet Take 8 mg by mouth 2 (two) times daily. 07/23/22   [provider]  oxyCODONE-acetaminophen (PERCOCET) 7.5-325 MG tablet Take 1 tablet by mouth every 8 (eight) hours as needed for severe pain.    [provider]  pantoprazole (PROTONIX) 40 MG tablet Take 40 mg by mouth daily. 08/02/22   [provider]  phentermine (ADIPEX-P) 37.5 MG tablet Take 37.5 mg by mouth daily. 06/02/22   [provider]  promethazine (PHENERGAN) 25 MG tablet Take 25 mg by mouth every 6 (six) hours as needed for nausea or vomiting. 05/15/22   [provider]  UBRELVY 100 MG TABS  Take 100 mg by mouth daily as needed (migraine). 05/27/22   [provider]  zinc gluconate 50 MG tablet Take 50 mg by mouth daily.    [provider]  zolpidem (AMBIEN) 10 MG tablet Take 10 mg by mouth at bedtime. 05/18/22   [provider]  buPROPion (WELLBUTRIN XL) 300 MG 24 hr tablet Take 1 tablet (300 mg total) by mouth daily. 04/03/20 04/03/20  Geoffery Lyons, MD      Allergies    Robaxin [methocarbamol], Toradol [ketorolac tromethamine], and Trazodone and nefazodone    Review of Systems   Review of Systems  Respiratory:  Positive for shortness of breath.   Cardiovascular:  Positive for chest pain.  All other systems reviewed and are negative.   Physical Exam Updated Vital Signs BP (!) 93/53   Pulse 96   Resp 19   Ht 5' (1.524 m)   Wt 117.9 kg   LMP 07/29/2022   SpO2 100%   BMI 50.78 kg/m  Physical Exam Vitals and nursing note reviewed.  Constitutional:      Appearance: Normal appearance.  HENT:     Head: Normocephalic and atraumatic.  Eyes:     General:        Right eye: No discharge.        Left eye: No discharge.     Conjunctiva/sclera: Conjunctivae normal.  Pulmonary:     Effort: Pulmonary effort is normal.  Skin:    General: Skin is warm and dry.     Findings: Rash present.     Comments: Scattered excoriations with scabbing localized to the bilateral breasts and abdomen.  These are at various stages of healing.  There is no active drainage from any of these lesions.  There is no surrounding erythema or warmth.   Neurological:     General: No focal deficit present.     Mental Status: She is alert.  Psychiatric:        Mood and Affect: Mood normal.        Behavior: Behavior normal.     ED Results / Procedures / Treatments   Labs (all labs ordered are listed, but only abnormal results are displayed) Labs Reviewed  BASIC METABOLIC PANEL - Abnormal; Notable for the following components:      Result Value   Sodium 133 (*)     Glucose, Bld 122 (*)    All other components within normal limits  CBC - Abnormal; Notable for the following components:   Hemoglobin 11.3 (*)    HCT 35.1 (*)    All other components within normal limits  PREGNANCY, URINE  TROPONIN I (HIGH SENSITIVITY)    EKG None  Radiology DG Chest 2 View  Result Date: 08/22/2022 CLINICAL DATA:  Chest pain EXAM: CHEST - 2 VIEW COMPARISON:  08/07/2022 FINDINGS: Upper normal size of cardiac  silhouette. Mediastinal contours and pulmonary vascularity normal. Lungs clear. No pulmonary infiltrate, pleural effusion, or pneumothorax. IMPRESSION: No acute abnormalities. Electronically Signed   By: Ulyses Southward M.D.   On: 08/22/2022 13:04    Procedures Procedures    Medications Ordered in ED Medications - No data to display  ED Course/ Medical Decision Making/ A&P Clinical Course as of 08/22/22 1602  Thu Aug 22, 2022  1559 CBC(!) No evidence of leukocytosis.  There is some evidence of mild anemia in comparison to previous results.  Does not meet transfusion criteria today. [CF]  1559 Troponin I (High Sensitivity) Initial troponin is negative. [CF]  1559 Basic metabolic panel(!) Mild hyponatremia with slightly elevated glucose. [CF]  1600 DG Chest 2 View I personally ordered and interpreted the study and do not see any evidence of pneumonia, cardiomegaly, or pulmonary congestion.  I do go to the radiology interpretation. [CF]  1600 ED EKG There is no evidence of sinus tachycardia without any evidence of ACS. [CF]    Clinical Course User Index [CF] Teressa Lower, PA-C   {   Click here for ABCD2, HEART and other calculators  Medical Decision Making Azalie Harbeck is a 47 y.o. female patient who presents to the emergency department for further evaluation of intermittent rash.  The triage note chief complaint initially said chest pain and shortness of breath however, the patient declines any chest pain or shortness of breath and has not had any  chest pain or shortness of breath in the last 48 hours.  She states that she has had this in the past intermittently over the last 6 months but none recently.  Her main complaint today is intermittent rash.  Patient does not see dermatology for this.  I do suspect that this could be allergic in nature.  There is no evidence that this is actually urticarial but does appear to be pruritic.  Open wounds or not infected.  I will likely have her follow-up with dermatology.  This has been ongoing for a year and I told her this is likely not contagious and she is cleared for work.  Strict return precautions were discussed.  She safer discharge.   Amount and/or Complexity of Data Reviewed Labs: ordered. Radiology: ordered.    Final Clinical Impression(s) / ED Diagnoses Final diagnoses:  Rash    Rx / DC Orders ED Discharge Orders     None         Teressa Lower, [place] 08/22/22 1603    Maia Plan, MD 08/23/22 [phone]

## 2022-08-22 NOTE — ED Triage Notes (Signed)
C/O chest pain and shortness of breath since Feb. Reports she's also been getting dizzy and wobbly when walking that she had some fall incidents.

## 2022-08-22 NOTE — ED Notes (Signed)
Pt states she has had cp for months was d/c from hospital and she also has a rash on her chest that she wants looked at also and a note for work

## 2022-09-09 ENCOUNTER — Telehealth (HOSPITAL_COMMUNITY): Payer: Self-pay | Admitting: Psychiatry

## 2022-09-09 ENCOUNTER — Ambulatory Visit (HOSPITAL_COMMUNITY)
Admission: EM | Admit: 2022-09-09 | Discharge: 2022-09-09 | Disposition: A | Discharge status: Home / Self Care | Payer: No Typology Code available for payment source

## 2022-09-09 DIAGNOSIS — F3181 Bipolar II disorder: Secondary | ICD-10-CM
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
## 2022-09-09 NOTE — Telephone Encounter (Signed)
D:  Doran Heater, FNP referred pt to virtual MH-IOP.  A:  Placed call to orient pt, but there was no answer.  Left vm for pt to call the case manager back.  Inform Tina.

## 2022-09-09 NOTE — Discharge Instructions (Addendum)
Patient is instructed prior to discharge to:  Take all medications as prescribed by his/her mental healthcare provider. Report any adverse effects and or reactions from the medicines to his/her outpatient provider promptly. Keep all scheduled appointments, to ensure that you are getting refills on time and to avoid any interruption in your medication.  If you are unable to keep an appointment call to reschedule.  Be sure to follow-up with resources and follow-up appointments provided.  Patient has been instructed & cautioned: To not engage in alcohol and or illegal drug use while on prescription medicines. In the event of worsening symptoms, patient is instructed to call the crisis hotline, 911 and or go to the nearest ED for appropriate evaluation and treatment of symptoms. To follow-up with his/her primary care provider for your other medical issues, concerns and or health care needs.  Information: -National Suicide Prevention Lifeline 1-800-SUICIDE or 212-483-3935.  -988 offers 24/7 access to trained crisis counselors who can help people experiencing mental health-related distress. People can call or text 988 or chat 988lifeline.org for themselves or if they are worried about a loved one who may need crisis support.        Base on the information you have provided and the presenting issue, outpatient services and resources for have been recommended.  It is imperative that you follow through with treatment recommendations within 5-7 days from the of discharge to mitigate further risk to your safety and mental well-being. A list of referrals has been provided below to get you started.  You are not limited to the list provided.  In case of an urgent crisis, you may contact the Mobile Crisis Unit with [facility] at 1.903-679-8896.   Patient has an appointment with Charleroi Sink from the [facility] Intensive Outpatient Program virtually on Wednesday 09-11-22 @ 9:30 a.m           "From small beginnings come great things"   [phone]  Mental Health Intensive Outpatient Therapy Program   The Intensive Outpatient Therapy Program (IOP) is short-term (ten day) group therapy program for adults (men and women) experiencing depression and/or anxiety.  Members of the program will learn that they are not alone as they receive support from others who are working on similar issues in an environment that promotes healing and growth.  Members will learn new ways to cope with stress, anxiety and depression through a combination of group therapy, mental health education and peer support. A variety of methods for mood stabilization will be used to help with the recovery and rebuilding process.  Reasons for participating in the program may include, but are not limited to:   Relationship and family problems   Depression after childbirth    Major life changes   Employment stress   Grief and Loss Issues   Group therapy sessions occur daily (Monday through Friday from 9:00 am to 12:00 pm) and are facilitated by a Licensed Therapist.  The groups are designed to be interactive and person centered to allow each member to be the center of their recovery process. Each member will personalize their own recovery experience by identifying and working towards their own personalized wellness goals.  In addition to group counseling, members will also receive a thorough medication evaluation and ongoing mediation monitoring by the program licensed Psychiatrist, as well as supportive services and referrals to community resources by the programs Masters Level Case Production designer, theatre/television/film.  As members approach the end of their stay in the program, they will actively participate  in creating their own personalized transition plan. Members will review their progress, discuss ways to maintain wellness and receive referrals to community resources that will assist with continued recovery.  We are  committed to providing exceptional mental health services and are proud to be accredited nationally by both the [employer], holding the coveted [employer] and by the [employer], holding the [employer] in quality care, Engineer, petroleum.    If you would like more information on The Intensive Outpatient Therapy Program (IOP), or would like to make a referral please contact us. We are here to help and look forward to speaking with you.  Sincerely,   Jeri Modena, M.Ed, CNA                [address]                                                          [place], [place] [address] T: [phone]    What to Expect            Nurturing Therapeutic Environment We provide a peaceful and healing environment where members feel a sense of overall safety and comfort.     Process Group Members participate in a one hour process group each day with a licensed clinical therapist.  The purpose of group therapy is to gain support and understanding from others experiencing similar situations while gaining understanding of how thoughts, feelings and behaviors affect overall wellness.    Education Groups Members participate in a one hour education group each day with a licensed clinical therapist. These groups are designed to help teach skills to manage mental health symptoms, increase self-awareness of destructive behaviors and increase self-esteem.  A variety of topics are covered including; boundaries, effective communication, self-care, Triggers, Sleep Hygiene, Relaxation, mental health education and healthy relationship skills.    Grief and Loss Group Members participate in a weekly, one hour grief and loss group with a trained grief and loss counselor. These groups are designed to help members identify and grieve various losses impacting overall wellness.   Medication Education Group Members will participate in one medication  education group with the programs licensed Pharmacist.  This group is tailored to the individualized questions and needs of each member.   DBT Skills and Mindfulness Members will be introduced to techniques that will help with emotion regulation, distress tolerance, and learning ways to be more "in the moment".   Stress Management Skills Members will be introduced to different relaxation techniques to help manage stress, encourage relaxation and increase emotional balance including; Progressive Muscle Relaxation (PMR), Visualization, Aromatherapy and Heartmath (an emotional regulation technique).   Journaling Journaling promotes private self-regulation and self-awareness and will be used as a tool to allow members to begin to identify, express and resolve feelings as well as gain a greater understanding of oneself and the progress made throughout their healing process.   Therapeutic Assignments Assignments are assigned regularly to work on outside of the group sessions to help reinforce skills learned, practice new behaviors and identify strategies for maintaining overall wellness.    Psychiatric Consultation Our program psychiatrist is highly trained and experienced in providing the highest quality of mental health mediation management. Members will receive an initial medication assessment and ongoing medication  monitoring throughout their time in the program. At the completion of the program, members will be referred to an external psychiatrist after for continued medication services.   Case Management Our program Case Manager is highly trained and experienced in providing supportive services and referrals to community resources.  Members will receive assistance with identifying and reducing barriers to maintaining wellness

## 2022-09-09 NOTE — Progress Notes (Signed)
   09/09/22 0940  [facility] Triage Screening (Walk-ins at [facility] only)  How Did You Hear About [place]? Self  What Is the Reason for Your Visit/Call Today? Pt presents to [facility] voluntarily due to ongoing depression symptoms. Pt reports passive SI ongoing for the past few years. Pt is diagnosed with Bipolar disorder and is prescribed Citalopram for her symptoms. Pt states she was being seen by a provider at [facility], but could no longer afford to go. Pt reports lack of appetite, increased stress due to financial issues, racing thoughts, insomnia, family issues with her children, she is the primary caregiver from her mother.Pt does not currently have a psychiatrist or therapist. Pt denies any plans or intent to harm her self currently. Pt states she is interested in outpatient services due to abusing her prescription pills.Pt denies drug or alcohol use, HI and AVH at this time  How Long Has This Been Causing You Problems? > than 6 months  Have You Recently Had Any Thoughts About Hurting Yourself? Yes  How long ago did you have thoughts about hurting yourself? ongoing for years  Are You Planning to Commit Suicide/Harm Yourself At This time? No  Have you Recently Had Thoughts About Hurting Someone Karolee Ohs? No  Are You Planning To Harm Someone At This Time? No  Are you currently experiencing any auditory, visual or other hallucinations? No  Have You Used Any Alcohol or Drugs in the Past 24 Hours? No  Do you have any current medical co-morbidities that require immediate attention? No  Clinician description of patient physical appearance/behavior: tangential speech,  What Do You Feel Would Help You the Most Today? Treatment for Depression or other mood problem  If access to [facility] Urgent Care was not available, would you have sought care in the Emergency Department? No  Determination of Need Routine (7 days)  Options For Referral Outpatient Therapy;Medication Management

## 2022-09-10 ENCOUNTER — Telehealth (HOSPITAL_COMMUNITY): Payer: Self-pay | Admitting: Psychiatry

## 2022-09-11 ENCOUNTER — Telehealth (HOSPITAL_COMMUNITY): Payer: Self-pay | Admitting: Psychiatry

## 2022-09-11 NOTE — Telephone Encounter (Signed)
Late Entry: D:  On 09-10-22 @ 1015 placed second call to pt in order to confirm if she wanted individual therapy and medication mgmt appt or virtual MH-IOP, but there still wasn't an answer.  A:  Inform Doran Heater, FNP.

## 2022-09-11 NOTE — Telephone Encounter (Signed)
D:  Pt called the MH-IOP case manager stating that her phone was out of service and just got turned back on.  Pt states she is looking for assistance to get back on all her medications since she can no longer return to [facility] d/t missing "a few" appointments.  "I need someone to start me back on all my medications as soon as possible.  I need my pain medications and muscle relaxants."  Mentioned [facility] to pt.  Pt stated she had been there already and was hoping to see a psychiatrist.  Informed pt of availability for a [facility] for group if she was interested; pt declined stating she needed to be seen sooner by a psychiatrist.  A:  Provided pt with support.  Informed her that [facility] psychiatrists will only prescribe what's within their scope of practice.  Recommended pt to see her PCP for all other(ie. Pain,etc) medications.  Transferred pt to the front desk to see if there were any cancellations for psychiatry.  Pt is scheduled to see Dr. Tenny Craw ([facility]) on 09-14-22. Strongly recommended [facility], [place], or [place] if needed to be seen sooner.  R:  Pt receptive.

## 2022-09-13 ENCOUNTER — Emergency Department (HOSPITAL_COMMUNITY): Payer: No Typology Code available for payment source

## 2022-09-13 ENCOUNTER — Other Ambulatory Visit: Payer: Self-pay

## 2022-09-13 ENCOUNTER — Emergency Department (HOSPITAL_COMMUNITY)
Admission: EM | Admit: 2022-09-13 | Discharge: 2022-09-13 | Disposition: A | Discharge status: Home / Self Care | Payer: No Typology Code available for payment source | Attending: Emergency Medicine | Admitting: Emergency Medicine

## 2022-09-13 DIAGNOSIS — I10 Essential (primary) hypertension: Secondary | ICD-10-CM | POA: Insufficient documentation

## 2022-09-13 DIAGNOSIS — Z79899 Other long term (current) drug therapy: Secondary | ICD-10-CM | POA: Insufficient documentation

## 2022-09-13 DIAGNOSIS — J40 Bronchitis, not specified as acute or chronic: Secondary | ICD-10-CM | POA: Insufficient documentation

## 2022-09-13 DIAGNOSIS — R059 Cough, unspecified: Secondary | ICD-10-CM | POA: Diagnosis present

## 2022-09-13 LAB — CBC
HCT: 35 % — ABNORMAL LOW (ref 36.0–46.0)
Hemoglobin: 11.2 g/dL — ABNORMAL LOW (ref 12.0–15.0)
MCH: 26.5 pg (ref 26.0–34.0)
MCHC: 32 g/dL (ref 30.0–36.0)
MCV: 82.9 fL (ref 80.0–100.0)
Platelets: 321 10*3/uL (ref 150–400)
RBC: 4.22 MIL/uL (ref 3.87–5.11)
RDW: 14 % (ref 11.5–15.5)
WBC: 11.2 10*3/uL — ABNORMAL HIGH (ref 4.0–10.5)
nRBC: 0 % (ref 0.0–0.2)

## 2022-09-13 LAB — BASIC METABOLIC PANEL
Anion gap: 9 (ref 5–15)
BUN: 17 mg/dL (ref 6–20)
CO2: 23 mmol/L (ref 22–32)
Calcium: 9 mg/dL (ref 8.9–10.3)
Chloride: 104 mmol/L (ref 98–111)
Creatinine, Ser: 0.95 mg/dL (ref 0.44–1.00)
GFR, Estimated: >60 mL/min (ref >60)
Glucose, Bld: 112 mg/dL — ABNORMAL HIGH (ref 70–99)
Potassium: 4.3 mmol/L (ref 3.5–5.1)
Sodium: 136 mmol/L (ref 135–145)

## 2022-09-13 LAB — I-STAT BETA HCG BLOOD, ED (MC, WL, AP ONLY): I-stat hCG, quantitative: <5 m[IU]/mL (ref <5)

## 2022-09-13 LAB — D-DIMER, QUANTITATIVE: D-Dimer, Quant: 0.42 ug/mL-FEU (ref 0.00–0.50)

## 2022-09-13 LAB — TROPONIN I (HIGH SENSITIVITY): Troponin I (High Sensitivity): 2 ng/L (ref <18)

## 2022-09-13 MED ORDER — GUAIFENESIN 100 MG/5ML PO LIQD
5.0000 mL | Freq: Once | ORAL | Status: AC
  Administered 2022-09-13: 5 mL via ORAL
  Filled 2022-09-13: qty 10

## 2022-09-13 MED ORDER — GUAIFENESIN ER 600 MG PO TB12: 600.0000 mg | ORAL_TABLET | Freq: Two times a day (BID) | ORAL | 0 refills | Status: AC | PRN

## 2022-09-13 MED ORDER — METHYLPREDNISOLONE 4 MG PO TBPK: ORAL_TABLET | ORAL | 0 refills | Status: AC

## 2022-09-13 NOTE — ED Provider Notes (Signed)
[facility] Provider Note  CSN: [id number] Arrival date & time: 09/13/22 4098  Chief Complaint(s) Pleurisy and Shortness of Breath  HPI Marilyn Fowler is a 47 y.o. female here for several days of intermittently productive cough, pleuritic chest pain and mild shortness of breath on exertion.  Chest pain is only there with deep breathing or coughing.  Patient denies any fevers or chills.  Sputum is clear.  Chest pain is not exertional.  Nonradiating.  No prior history of DVT/PE.  Known prolonged immobilization or OCP use.  The history is provided by the patient.    Past Medical History Past Medical History:  Diagnosis Date   Alcohol abuse    Anemia    Anemia    Anxiety    Blood transfusion without reported diagnosis    Depression    Hemorrhoid    Hypertension    Migraine    Patient Active Problem List   Diagnosis Date Noted   Community acquired pneumonia 06/10/2022   Nicotine dependence, cigarettes, uncomplicated 06/10/2022   Reactive airway disease with wheezing with acute exacerbation 06/09/2022   Reactive airway disease 06/09/2022   Acute bronchitis 06/09/2022   Bilateral carpal tunnel syndrome 05/01/2021   History of suicide attempt 05/29/2020   Benzodiazepine abuse (HCC) 05/29/2020   Insomnia 05/29/2020   Sprained ankle 01/14/2020   Bipolar depression (HCC) 01/13/2020   MDD (major depressive disorder), recurrent severe, without psychosis (HCC) 10/18/2019   Physical exam 08/07/2017   Headache 06/30/2017   Chronic right shoulder pain 09/04/2016   Hypokalemia 04/30/2016   Hypomagnesemia 04/30/2016   Alcoholic hepatitis 04/30/2016   Alcohol induced fatty liver 04/30/2016   Alcohol abuse 04/29/2016   Left medial knee pain 06/29/2015   Acute bacterial sinusitis 10/11/2014   Maxillary sinusitis, acute 09/14/2014   Gastroenteritis 11/09/2013   Pleuritic chest pain 09/17/2013   Chest pain 11/18/2012   Class 3 severe  obesity due to excess calories with serious comorbidity and body mass index (BMI) of 50.0 to 59.9 in adult [facility]) 09/29/2012   Essential hypertension 07/12/2012   Migraine 07/12/2012   Bipolar II disorder, most recent episode major depressive (HCC) 07/11/2012   Home Medication(s) Prior to Admission medications   Medication Sig Start Date End Date Taking? Authorizing Provider  guaiFENesin (MUCINEX) 600 MG 12 hr tablet Take 1 tablet (600 mg total) by mouth 2 (two) times daily as needed for up to 7 days for cough. 09/13/22 09/20/22 Yes Aadit Hagood, Amadeo Garnet, MD  methylPREDNISolone (MEDROL DOSEPAK) 4 MG TBPK tablet Use as directed on the package 09/13/22  Yes Kerolos Nehme, Amadeo Garnet, MD  albuterol (VENTOLIN HFA) 108 (90 Base) MCG/ACT inhaler Inhale 2 puffs into the lungs every 4 (four) hours as needed for wheezing or shortness of breath. 06/11/22   Shalhoub, Deno Lunger, MD  amLODipine (NORVASC) 2.5 MG tablet Take 1 tablet (2.5 mg total) by mouth daily. 01/19/20   Clapacs, Jackquline Denmark, MD  Ascorbic Acid (VITAMIN C PO) Take 1 tablet by mouth daily.    [provider]  BIOTIN 5000 PO Take 5,000 mcg by mouth daily.    [provider]  Black Cohosh 450 MG CAPS Take 450 mg by mouth daily.    [provider]  calcium carbonate (OS-CAL - DOSED IN MG OF ELEMENTAL CALCIUM) 1250 (500 Ca) MG tablet Take 1 tablet by mouth daily with breakfast.    [provider]  cefdinir (OMNICEF) 300 MG capsule Take 1 capsule (300 mg total)  by mouth 2 (two) times daily. First dose 2/14. Patient not taking: Reported on 08/07/2022 06/12/22   Marinda Elk, MD  Cholecalciferol (VITAMIN D3 PO) Take 2,000 Units by mouth daily.    [provider]  citalopram (CELEXA) 20 MG tablet Take 20 mg by mouth daily.    [provider]  clonazePAM (KLONOPIN) 1 MG tablet Take 1 mg by mouth 3 (three) times daily as needed for anxiety. 05/18/22   [provider]  cyclobenzaprine (FLEXERIL) 10 MG  tablet Take 10 mg by mouth 3 (three) times daily. 05/17/22   [provider]  diphenhydrAMINE (BENADRYL) 50 MG capsule Take 50 mg by mouth at bedtime.    [provider]  ferrous sulfate 325 (65 FE) MG tablet Take 1 tablet (325 mg total) by mouth 3 (three) times daily with meals. Patient taking differently: Take 325 mg by mouth daily with breakfast. 11/25/20 08/07/22  Koleen Distance, MD  ibuprofen (ADVIL) 200 MG tablet Take 400 mg by mouth every 4 (four) hours as needed for headache (pain).    [provider]  metoprolol tartrate (LOPRESSOR) 25 MG tablet Take 25 mg by mouth every evening.    [provider]  Multiple Vitamin (MULTIVITAMIN) tablet Take 1 tablet by mouth daily.    [provider]  omeprazole (PRILOSEC) 20 MG capsule Take 20 mg by mouth daily. 05/15/22   [provider]  ondansetron (ZOFRAN-ODT) 8 MG disintegrating tablet Take 8 mg by mouth 2 (two) times daily. 07/23/22   [provider]  oxyCODONE-acetaminophen (PERCOCET) 7.5-325 MG tablet Take 1 tablet by mouth every 8 (eight) hours as needed for severe pain.    [provider]  pantoprazole (PROTONIX) 40 MG tablet Take 40 mg by mouth daily. 08/02/22   [provider]  phentermine (ADIPEX-P) 37.5 MG tablet Take 37.5 mg by mouth daily. 06/02/22   [provider]  promethazine (PHENERGAN) 25 MG tablet Take 25 mg by mouth every 6 (six) hours as needed for nausea or vomiting. 05/15/22   [provider]  UBRELVY 100 MG TABS Take 100 mg by mouth daily as needed (migraine). 05/27/22   [provider]  zinc gluconate 50 MG tablet Take 50 mg by mouth daily.    [provider]  zolpidem (AMBIEN) 10 MG tablet Take 10 mg by mouth at bedtime. 05/18/22   [provider]  buPROPion (WELLBUTRIN XL) 300 MG 24 hr tablet Take 1 tablet (300 mg total) by mouth daily. 04/03/20 04/03/20  Geoffery Lyons, MD                                                                                                                                     Allergies Robaxin [methocarbamol], Toradol [ketorolac tromethamine], and Trazodone and nefazodone  Review of Systems Review of Systems As noted in HPI  Physical Exam Vital Signs  I have reviewed the triage  vital signs BP 106/84   Pulse 96   Temp 98.3 F (36.8 C) (Oral)   Resp 16   Ht 5' (1.524 m)   Wt 119.3 kg   LMP 08/30/2022 (Approximate)   SpO2 96%   BMI 51.36 kg/m   Physical Exam Vitals reviewed.  Constitutional:      General: She is not in acute distress.    Appearance: She is well-developed. She is obese. She is not diaphoretic.  HENT:     Head: Normocephalic and atraumatic.     Nose: Nose normal.  Eyes:     General: No scleral icterus.       Right eye: No discharge.        Left eye: No discharge.     Conjunctiva/sclera: Conjunctivae normal.     Pupils: Pupils are equal, round, and reactive to light.  Cardiovascular:     Rate and Rhythm: Normal rate and regular rhythm.     Heart sounds: No murmur heard.    No friction rub. No gallop.  Pulmonary:     Effort: Pulmonary effort is normal. No respiratory distress.     Breath sounds: Normal breath sounds. No stridor. No wheezing, rhonchi or rales.  Abdominal:     General: There is no distension.     Palpations: Abdomen is soft.     Tenderness: There is no abdominal tenderness.  Musculoskeletal:        General: No tenderness.     Cervical back: Normal range of motion and neck supple.     Right lower leg: No edema.     Left lower leg: No edema.  Skin:    General: Skin is warm and dry.     Findings: No erythema or rash.  Neurological:     Mental Status: She is alert and oriented to person, place, and time.     ED Results and Treatments Labs (all labs ordered are listed, but only abnormal results are displayed) Labs Reviewed  BASIC METABOLIC PANEL - Abnormal; Notable for the following components:      Result Value    Glucose, Bld 112 (*)    All other components within normal limits  CBC - Abnormal; Notable for the following components:   WBC 11.2 (*)    Hemoglobin 11.2 (*)    HCT 35.0 (*)    All other components within normal limits  D-DIMER, QUANTITATIVE  I-STAT BETA HCG BLOOD, ED ([facility], [facility], [facility] ONLY)  TROPONIN I (HIGH SENSITIVITY)  TROPONIN I (HIGH SENSITIVITY)                                                                                                                         EKG  EKG Interpretation  Date/Time:  Friday Sep 13 2022 02:31:42 EDT Ventricular Rate:  98 PR Interval:  128 QRS Duration: 81 QT Interval:  349 QTC Calculation: 446 R Axis:   -31 Text Interpretation: Sinus rhythm Left axis deviation Low voltage, precordial leads Consider  anterior infarct No significant change was found Confirmed by Drema Pry [phone]) on 09/13/2022 2:33:50 AM       Radiology DG Chest 2 View  Result Date: 09/13/2022 CLINICAL DATA:  Chest pain. EXAM: CHEST - 2 VIEW COMPARISON:  August 22, 2022 FINDINGS: The heart size and mediastinal contours are within normal limits. Low lung volumes are noted. Mild linear atelectasis is seen within the lateral aspect of the left lung base. There is no evidence of an acute infiltrate, pleural effusion or pneumothorax. The visualized skeletal structures are unremarkable. IMPRESSION: No active cardiopulmonary disease. Electronically Signed   By: Aram Candela M.D.   On: 09/13/2022 02:51    Medications Ordered in ED Medications  guaiFENesin (ROBITUSSIN) 100 MG/5ML liquid 5 mL (has no administration in time range)                                                                                                                                     Procedures Procedures  (including critical care time)  Medical Decision Making / ED Course  Click here for ABCD2, HEART and other calculators  Medical Decision Making Amount and/or Complexity of Data Reviewed Labs:  ordered. Radiology: ordered.    This patient presents to the ED for: Chest pain with SOB   Key initial findings: Lungs CTAB  Co-morbidities/SDOH that complicate the patient evaluation/care: Tobacco smoker Bipolar, MDD   Presentation involves an extensive number of treatment options, and is a complaint that carries with it a high risk of complications and morbidity. The differential diagnosis includes but not limited to:  Reactive airway disease, bronchitis, PNA, PE. Atypical for ACS. Doubt dissection, PTx  Hospitalization considered:  Yes if PE positive  Initial intervention:  N/a   Work up Interpretation and Management:  Cardiac Monitoring/EKG: Telemetry with NSR. EKG without acute ischemic change or pericarditis. No dysrhythmias  Laboratory Tests ordered listed below with my independent interpretation:     Imaging Studies ordered listed below with my independent interpretation:   ED Course:  Clinical Course as of 09/13/22 0456  Fri Sep 13, 2022  0358 CBC with mild leukocytosis.  Mild anemia with stable hemoglobin.  Metabolic panel without significant electrolyte derangements or renal sufficiency.  Beta-hCG obtained to help guide care and negative.  Dimer negative  Troponin negative.  No need for additional troponins at this time.  Chest x-ray without evidence of pneumonia, pneumothorax, pulmonary edema or pleural effusions. [PC]    Clinical Course User Index [PC] Bennette Hasty, Amadeo Garnet, MD     Final Clinical Impression(s) / ED Diagnoses Final diagnoses:  Bronchitis   The patient appears reasonably screened and/or stabilized for discharge and I doubt any other medical condition or other [facility] requiring further screening, evaluation, or treatment in the ED at this time. I have discussed the findings, Dx and Tx plan with the patient/family who expressed understanding and agree(s) with the plan. Discharge instructions discussed at  length. The patient/family  was given strict return precautions who verbalized understanding of the instructions. No further questions at time of discharge.  Disposition: Discharge  Condition: Good  ED Discharge Orders          Ordered    guaiFENesin (MUCINEX) 600 MG 12 hr tablet  2 times daily PRN        09/13/22 0453    methylPREDNISolone (MEDROL DOSEPAK) 4 MG TBPK tablet        09/13/22 0453             Follow Up: Primary care provider  Call  to schedule an appointment for close follow up           This chart was dictated using voice recognition software.  Despite best efforts to proofread,  errors can occur which can change the documentation meaning.    Nira Conn, MD 09/13/22 [phone]

## 2022-09-13 NOTE — ED Triage Notes (Signed)
Pt c/o midsternal chest pain and lateral rib pain as well as shortness of breath, worsening over the past few days. States pain is worse when she takes a deep breath and cough. SOB with exertion is worse than normal. Also c/o some lightheadedness. Denies fevers/chills. Took tylenol yesterday AM. Pt was notably SOB after walking from lobby to triage room.

## 2022-09-14 ENCOUNTER — Ambulatory Visit (HOSPITAL_COMMUNITY): Payer: No Typology Code available for payment source | Admitting: Psychiatry

## 2022-09-14 ENCOUNTER — Encounter (HOSPITAL_COMMUNITY): Payer: Self-pay | Admitting: Psychiatry

## 2022-09-14 DIAGNOSIS — F3181 Bipolar II disorder: Secondary | ICD-10-CM

## 2022-09-14 DIAGNOSIS — F411 Generalized anxiety disorder: Secondary | ICD-10-CM | POA: Diagnosis not present

## 2022-09-14 MED ORDER — QUETIAPINE FUMARATE 25 MG PO TABS: 25.0000 mg | ORAL_TABLET | Freq: Three times a day (TID) | ORAL | 2 refills | Status: AC | PRN

## 2022-09-14 MED ORDER — CITALOPRAM HYDROBROMIDE 20 MG PO TABS: 20.0000 mg | ORAL_TABLET | Freq: Every day | ORAL | 2 refills | Status: DC

## 2022-09-14 MED ORDER — QUETIAPINE FUMARATE ER 150 MG PO TB24: 150.0000 mg | ORAL_TABLET | Freq: Every day | ORAL | 1 refills | Status: AC
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
## 2022-09-17 ENCOUNTER — Telehealth (HOSPITAL_COMMUNITY): Payer: No Typology Code available for payment source | Admitting: Licensed Clinical Social Worker

## 2022-09-17 ENCOUNTER — Telehealth (HOSPITAL_COMMUNITY): Payer: Self-pay | Admitting: Licensed Clinical Social Worker

## 2022-09-17 ENCOUNTER — Other Ambulatory Visit (HOSPITAL_COMMUNITY): Payer: No Typology Code available for payment source | Admitting: Psychiatry

## 2022-09-17 DIAGNOSIS — F411 Generalized anxiety disorder: Secondary | ICD-10-CM | POA: Diagnosis not present

## 2022-09-17 DIAGNOSIS — F3181 Bipolar II disorder: Secondary | ICD-10-CM
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
## 2022-09-18 ENCOUNTER — Telehealth (HOSPITAL_COMMUNITY): Payer: Self-pay | Admitting: Psychiatry

## 2022-09-18 ENCOUNTER — Other Ambulatory Visit (HOSPITAL_COMMUNITY): Payer: No Typology Code available for payment source

## 2022-09-18 NOTE — Telephone Encounter (Signed)
D:  Pt had phoned this morning stating that she was having difficulty logging into virtual MH-IOP.  A:  Provided pt with support.  Encouraged her to log off and log back on to see if that would work.  Mentioned if that didn't work for her to call in at least for today.  Pt did neither nor did she call the case manager.  Placed call to pt, left vm to call the case manager back.  Will go ahead and resend a group link again for tomorrow's group and see if she logs on.  Inform treatment team.

## 2022-09-19 ENCOUNTER — Other Ambulatory Visit (HOSPITAL_COMMUNITY): Payer: No Typology Code available for payment source

## 2022-09-19 ENCOUNTER — Telehealth (HOSPITAL_COMMUNITY): Payer: Self-pay | Admitting: Psychiatry

## 2022-09-20 ENCOUNTER — Ambulatory Visit (HOSPITAL_COMMUNITY): Payer: No Typology Code available for payment source

## 2022-09-20 ENCOUNTER — Telehealth (HOSPITAL_COMMUNITY): Payer: Self-pay | Admitting: Psychiatry

## 2022-09-20 NOTE — Telephone Encounter (Signed)
D:  Pt apparently sent the group facilitator Noralee Stain, [place]) an email stating the left a message day before yesterday.  Pt asked if she wanted to know if it was [facility] for her to start group next week.  "I have been sick and just finished my steroids (was seen in the ER and have not slept well since I started taking them) and I am having trouble focusing and my mind is spinning."  A:  Cory forwarded the email to the MH-IOP case mgr (who has previously spoken to pt by phone).  Case manager called pt multiple times and has yet to hear back from her.  Pt has been taken out of the system for MH-IOP d/t non-compliancy with attendance.  Inform Dr. Tenny Craw.

## 2022-09-24 ENCOUNTER — Ambulatory Visit (HOSPITAL_COMMUNITY): Payer: No Typology Code available for payment source

## 2022-09-25 ENCOUNTER — Ambulatory Visit (HOSPITAL_COMMUNITY): Payer: No Typology Code available for payment source

## 2022-09-25 ENCOUNTER — Emergency Department (HOSPITAL_BASED_OUTPATIENT_CLINIC_OR_DEPARTMENT_OTHER): Payer: No Typology Code available for payment source

## 2022-09-25 ENCOUNTER — Encounter (HOSPITAL_BASED_OUTPATIENT_CLINIC_OR_DEPARTMENT_OTHER): Payer: Self-pay | Admitting: Emergency Medicine

## 2022-09-25 ENCOUNTER — Other Ambulatory Visit: Payer: Self-pay

## 2022-09-25 ENCOUNTER — Emergency Department (HOSPITAL_BASED_OUTPATIENT_CLINIC_OR_DEPARTMENT_OTHER)
Admission: EM | Admit: 2022-09-25 | Discharge: 2022-09-25 | Disposition: A | Discharge status: Home / Self Care | Payer: No Typology Code available for payment source | Attending: Emergency Medicine | Admitting: Emergency Medicine

## 2022-09-25 DIAGNOSIS — I10 Essential (primary) hypertension: Secondary | ICD-10-CM | POA: Insufficient documentation

## 2022-09-25 DIAGNOSIS — Z79899 Other long term (current) drug therapy: Secondary | ICD-10-CM | POA: Diagnosis not present

## 2022-09-25 DIAGNOSIS — K625 Hemorrhage of anus and rectum: Secondary | ICD-10-CM | POA: Diagnosis present

## 2022-09-25 DIAGNOSIS — R112 Nausea with vomiting, unspecified: Secondary | ICD-10-CM | POA: Diagnosis not present

## 2022-09-25 LAB — CBC WITH DIFFERENTIAL/PLATELET
Abs Immature Granulocytes: 0.02 10*3/uL (ref 0.00–0.07)
Basophils Absolute: 0.1 10*3/uL (ref 0.0–0.1)
Basophils Relative: 1 %
Eosinophils Absolute: 0.1 10*3/uL (ref 0.0–0.5)
Eosinophils Relative: 1 %
HCT: 35.3 % — ABNORMAL LOW (ref 36.0–46.0)
Hemoglobin: 11.2 g/dL — ABNORMAL LOW (ref 12.0–15.0)
Immature Granulocytes: 0 %
Lymphocytes Relative: 20 %
Lymphs Abs: 1.6 10*3/uL (ref 0.7–4.0)
MCH: 26.3 pg (ref 26.0–34.0)
MCHC: 31.7 g/dL (ref 30.0–36.0)
MCV: 82.9 fL (ref 80.0–100.0)
Monocytes Absolute: 0.9 10*3/uL (ref 0.1–1.0)
Monocytes Relative: 12 %
Neutro Abs: 5.2 10*3/uL (ref 1.7–7.7)
Neutrophils Relative %: 66 %
Platelets: 354 10*3/uL (ref 150–400)
RBC: 4.26 MIL/uL (ref 3.87–5.11)
RDW: 15.8 % — ABNORMAL HIGH (ref 11.5–15.5)
WBC: 7.8 10*3/uL (ref 4.0–10.5)
nRBC: 0 % (ref 0.0–0.2)

## 2022-09-25 LAB — COMPREHENSIVE METABOLIC PANEL
ALT: 131 U/L — ABNORMAL HIGH (ref 0–44)
AST: 115 U/L — ABNORMAL HIGH (ref 15–41)
Albumin: 4 g/dL (ref 3.5–5.0)
Alkaline Phosphatase: 80 U/L (ref 38–126)
Anion gap: 10 (ref 5–15)
BUN: <5 mg/dL — ABNORMAL LOW (ref 6–20)
CO2: 22 mmol/L (ref 22–32)
Calcium: 8.4 mg/dL — ABNORMAL LOW (ref 8.9–10.3)
Chloride: 100 mmol/L (ref 98–111)
Creatinine, Ser: 0.75 mg/dL (ref 0.44–1.00)
GFR, Estimated: >60 mL/min (ref >60)
Glucose, Bld: 126 mg/dL — ABNORMAL HIGH (ref 70–99)
Potassium: 3.4 mmol/L — ABNORMAL LOW (ref 3.5–5.1)
Sodium: 132 mmol/L — ABNORMAL LOW (ref 135–145)
Total Bilirubin: 0.5 mg/dL (ref 0.3–1.2)
Total Protein: 6.9 g/dL (ref 6.5–8.1)

## 2022-09-25 LAB — LIPASE, BLOOD: Lipase: 22 U/L (ref 11–51)

## 2022-09-25 LAB — OCCULT BLOOD X 1 CARD TO LAB, STOOL: Fecal Occult Bld: POSITIVE — AB

## 2022-09-25 LAB — HCG, SERUM, QUALITATIVE: Preg, Serum: NEGATIVE

## 2022-09-25 MED ORDER — DROPERIDOL 2.5 MG/ML IJ SOLN
1.2500 mg | Freq: Once | INTRAMUSCULAR | Status: AC
  Administered 2022-09-25: 1.25 mg via INTRAVENOUS
  Filled 2022-09-25: qty 2

## 2022-09-25 MED ORDER — SODIUM CHLORIDE 0.9 % IV SOLN: Freq: Once | INTRAVENOUS | Status: AC

## 2022-09-25 MED ORDER — IOHEXOL 300 MG/ML  SOLN
100.0000 mL | Freq: Once | INTRAMUSCULAR | Status: AC | PRN
  Administered 2022-09-25: 125 mL via INTRAVENOUS

## 2022-09-25 MED ORDER — DIPHENHYDRAMINE HCL 50 MG/ML IJ SOLN
12.5000 mg | Freq: Once | INTRAMUSCULAR | Status: AC
  Administered 2022-09-25: 12.5 mg via INTRAVENOUS
  Filled 2022-09-25: qty 1

## 2022-09-25 MED ORDER — PRAMOXINE HCL (PERIANAL) 1 % EX FOAM: 1.0000 | Freq: Three times a day (TID) | CUTANEOUS | 0 refills | Status: AC | PRN

## 2022-09-25 MED ORDER — PANTOPRAZOLE SODIUM 40 MG IV SOLR
40.0000 mg | Freq: Once | INTRAVENOUS | Status: AC
  Administered 2022-09-25: 40 mg via INTRAVENOUS
  Filled 2022-09-25: qty 10

## 2022-09-25 MED ORDER — LORAZEPAM 1 MG PO TABS
1.0000 mg | ORAL_TABLET | Freq: Once | ORAL | Status: AC
  Administered 2022-09-25: 1 mg via ORAL
  Filled 2022-09-25: qty 1

## 2022-09-25 MED ORDER — POTASSIUM CHLORIDE 20 MEQ PO PACK
40.0000 meq | PACK | Freq: Once | ORAL | Status: AC
  Administered 2022-09-25: 40 meq via ORAL
  Filled 2022-09-25: qty 2

## 2022-09-25 MED ORDER — OMEPRAZOLE 20 MG PO CPDR: 20.0000 mg | DELAYED_RELEASE_CAPSULE | Freq: Every day | ORAL | 0 refills | Status: AC

## 2022-09-25 MED ORDER — PROMETHAZINE HCL 25 MG PO TABS: 25.0000 mg | ORAL_TABLET | Freq: Four times a day (QID) | ORAL | 0 refills | Status: AC | PRN

## 2022-09-25 NOTE — ED Provider Notes (Signed)
[facility] Provider Note   CSN: [id number] Arrival date & time: 09/25/22  1204     History  Chief Complaint  Patient presents with   Rectal Bleeding   Emesis    Marilyn Fowler is a 47 y.o. female.  With PMH of HTN, anemia, internal hemorrhoids, bipolar disorder who presents with nausea and decreased p.o. intake over the past 5 days but ongoing since February associated with rectal discomfort and intermittent bright red blood per rectum ongoing for months.  She has had overall decreased appetite and p.o. intake since February but notes it was worsening over the past 5 days.  She takes Zofran without relief.  She is saying she cannot keep down fluids or food.  She is complaining of pain in her rectum as well as upper epigastrium burning pain and reflux.  She has not taken any PPIs.  She notes a heavy menstrual period that ended a few days prior.  No pain with urination, hematuria or polyuria.  She endorses generalized fatigue and weakness due to decreased p.o. intake.  No fevers reported no chills reported, no CP or SOB.  Has had previous internal hemorrhoids banded by GI in the past but currently has no GI provider.   Rectal Bleeding Associated symptoms: vomiting   Emesis      Home Medications Prior to Admission medications   Medication Sig Start Date End Date Taking? Authorizing Provider  omeprazole (PRILOSEC) 20 MG capsule Take 1 capsule (20 mg total) by mouth daily. 09/25/22 10/25/22 Yes Mardene Sayer, MD  pramoxine (PROCTOFOAM) 1 % foam Place 1 Application rectally 3 (three) times daily as needed for anal itching. 09/25/22  Yes Mardene Sayer, MD  promethazine (PHENERGAN) 25 MG tablet Take 1 tablet (25 mg total) by mouth every 6 (six) hours as needed for nausea or vomiting. 09/25/22  Yes Mardene Sayer, MD  albuterol (VENTOLIN HFA) 108 (90 Base) MCG/ACT inhaler Inhale 2 puffs into the lungs every 4 (four) hours as needed  for wheezing or shortness of breath. 06/11/22   Shalhoub, Deno Lunger, MD  amLODipine (NORVASC) 2.5 MG tablet Take 1 tablet (2.5 mg total) by mouth daily. 01/19/20   Clapacs, Jackquline Denmark, MD  Ascorbic Acid (VITAMIN C PO) Take 1 tablet by mouth daily.    [provider]  BIOTIN 5000 PO Take 5,000 mcg by mouth daily.    [provider]  Black Cohosh 450 MG CAPS Take 450 mg by mouth daily.    [provider]  calcium carbonate (OS-CAL - DOSED IN MG OF ELEMENTAL CALCIUM) 1250 (500 Ca) MG tablet Take 1 tablet by mouth daily with breakfast.    [provider]  cefdinir (OMNICEF) 300 MG capsule Take 1 capsule (300 mg total) by mouth 2 (two) times daily. First dose 2/14. Patient not taking: Reported on 08/07/2022 06/12/22   Marinda Elk, MD  Cholecalciferol (VITAMIN D3 PO) Take 2,000 Units by mouth daily.    [provider]  citalopram (CELEXA) 20 MG tablet Take 1 tablet (20 mg total) by mouth daily. 09/14/22   Myrlene Broker, MD  ferrous sulfate 325 (65 FE) MG tablet Take 1 tablet (325 mg total) by mouth 3 (three) times daily with meals. Patient taking differently: Take 325 mg by mouth daily with breakfast. 11/25/20 08/07/22  Koleen Distance, MD  ibuprofen (ADVIL) 200 MG tablet Take 400 mg by mouth every 4 (four) hours as needed for headache (pain).  [provider]  methylPREDNISolone (MEDROL DOSEPAK) 4 MG TBPK tablet Use as directed on the package 09/13/22   Cardama, Amadeo Garnet, MD  metoprolol tartrate (LOPRESSOR) 25 MG tablet Take 25 mg by mouth every evening.    [provider]  Multiple Vitamin (MULTIVITAMIN) tablet Take 1 tablet by mouth daily.    [provider]  omeprazole (PRILOSEC) 20 MG capsule Take 20 mg by mouth daily. 05/15/22   [provider]  QUEtiapine (SEROQUEL) 25 MG tablet Take 1 tablet (25 mg total) by mouth 3 (three) times daily as needed. 09/14/22 09/14/23  Myrlene Broker, MD  QUEtiapine Fumarate (SEROQUEL  XR) 150 MG 24 hr tablet Take 1 tablet (150 mg total) by mouth at bedtime. 09/14/22   Myrlene Broker, MD  zinc gluconate 50 MG tablet Take 50 mg by mouth daily.    [provider]  buPROPion (WELLBUTRIN XL) 300 MG 24 hr tablet Take 1 tablet (300 mg total) by mouth daily. 04/03/20 04/03/20  Geoffery Lyons, MD      Allergies    Robaxin [methocarbamol], Toradol [ketorolac tromethamine], and Trazodone and nefazodone    Review of Systems   Review of Systems  Gastrointestinal:  Positive for hematochezia and vomiting.    Physical Exam Updated Vital Signs BP (!) 140/92   Pulse 85   Temp 98.3 F (36.8 C) (Oral)   Resp 15   Ht 5' (1.524 m)   Wt 114.3 kg   LMP 08/30/2022 (Approximate) Comment: neg preg serum in er 09/25/22  SpO2 96%   BMI 49.22 kg/m  Physical Exam Constitutional: Alert and oriented. Well appearing and in no distress. Eyes: Conjunctivae are normal. ENT      Head: Normocephalic and atraumatic. Cardiovascular: S1, S2,  RRR Respiratory: Normal respiratory effort. Breath sounds are normal.O2 sat 96 on RA Gastrointestinal: Soft and diffuse mild ttp, no localization, no rebound or guarding. Well healing previous surgical scar clean, dry intact  Digital rectal exam performed with bedside RN as chaperone.  There are nonthrombosed nontender external hemorrhoids present.  No pain on rectal exam no evidence of ulcer.  No fluctuance present.  No bright red blood per rectum.  No melena present.  Very minimal stool in rectal vault. Musculoskeletal: Normal range of motion in all extremities. Neurologic: Normal speech and language. No gross focal neurologic deficits are appreciated. Skin: Skin is warm, dry and intact. No rash noted. Psychiatric: Mood and affect are normal. Speech and behavior are normal.  ED Results / Procedures / Treatments   Labs (all labs ordered are listed, but only abnormal results are displayed) Labs Reviewed  COMPREHENSIVE METABOLIC PANEL - Abnormal;  Notable for the following components:      Result Value   Sodium 132 (*)    Potassium 3.4 (*)    Glucose, Bld 126 (*)    BUN <5 (*)    Calcium 8.4 (*)    AST 115 (*)    ALT 131 (*)    All other components within normal limits  CBC WITH DIFFERENTIAL/PLATELET - Abnormal; Notable for the following components:   Hemoglobin 11.2 (*)    HCT 35.3 (*)    RDW 15.8 (*)    All other components within normal limits  OCCULT BLOOD X 1 CARD TO LAB, STOOL - Abnormal; Notable for the following components:   Fecal Occult Bld POSITIVE (*)    All other components within normal limits  LIPASE, BLOOD  HCG, SERUM, QUALITATIVE  POC OCCULT BLOOD, ED  EKG EKG Interpretation  Date/Time:  Wednesday Sep 25 2022 12:39:57 EDT Ventricular Rate:  71 PR Interval:  123 QRS Duration: 90 QT Interval:  403 QTC Calculation: 438 R Axis:   -11 Text Interpretation: Sinus rhythm Low voltage, precordial leads No significant change since last tracing Confirmed by Vivien Rossetti ([id number]) on 09/25/2022 1:49:36 PM  Radiology CT ABDOMEN PELVIS W CONTRAST  Result Date: 09/25/2022 CLINICAL DATA:  Nausea, vomiting, and rectal bleeding EXAM: CT ABDOMEN AND PELVIS WITH CONTRAST TECHNIQUE: Multidetector CT imaging of the abdomen and pelvis was performed using the standard protocol following bolus administration of intravenous contrast. RADIATION DOSE REDUCTION: This exam was performed according to the departmental dose-optimization program which includes automated exposure control, adjustment of the mA and/or kV according to patient size and/or use of iterative reconstruction technique. CONTRAST:  OMNIPAQUE IOHEXOL 300 MG/ML  SOLN COMPARISON:  CT abdomen and pelvis dated 08/07/2022 FINDINGS: Lower chest: No focal consolidation or pulmonary nodule in the lung bases. No pleural effusion or pneumothorax demonstrated. Partially imaged heart size is normal. Hepatobiliary: No focal hepatic lesions. No intra or extrahepatic  biliary ductal dilation. Mildly distended gallbladder without mural thickening. Pancreas: No focal lesions or main ductal dilation. Spleen: Normal in size without focal abnormality. Adrenals/Urinary Tract: No adrenal nodules. No suspicious renal mass, calculi or hydronephrosis. No focal bladder wall thickening. Stomach/Bowel: Normal appearance of the stomach. No evidence of bowel wall thickening, distention, or inflammatory changes. Single colonic diverticulum along the descending colon. Normal appendix. Vascular/Lymphatic: No significant vascular findings are present. No enlarged abdominal or pelvic lymph nodes. Reproductive: No adnexal masses. Other: No free fluid, fluid collection, or free air. Musculoskeletal: No acute or abnormal lytic or blastic osseous lesions. IMPRESSION: 1. No acute abdominopelvic findings. 2. Single colonic diverticulum along the descending colon. Electronically Signed   By: Agustin Cree M.D.   On: 09/25/2022 14:40    Procedures Procedures    Medications Ordered in ED Medications  pantoprazole (PROTONIX) injection 40 mg (40 mg Intravenous Given 09/25/22 1315)  droperidol (INAPSINE) 2.5 MG/ML injection 1.25 mg (1.25 mg Intravenous Given 09/25/22 1311)  diphenhydrAMINE (BENADRYL) injection 12.5 mg (12.5 mg Intravenous Given 09/25/22 1310)  0.9 %  sodium chloride infusion ( Intravenous Stopped 09/25/22 1314)  potassium chloride (KLOR-CON) packet 40 mEq (40 mEq Oral Given 09/25/22 1338)  iohexol (OMNIPAQUE) 300 MG/ML solution 100 mL (125 mLs Intravenous Contrast Given 09/25/22 1402)  LORazepam (ATIVAN) tablet 1 mg (1 mg Oral Given 09/25/22 1424)  diphenhydrAMINE (BENADRYL) injection 12.5 mg (12.5 mg Intravenous Given 09/25/22 1424)    ED Course/ Medical Decision Making/ A&P                             Medical Decision Making  Marilyn Fowler is a 47 y.o. female.  With PMH of HTN, anemia, internal hemorrhoids, bipolar disorder who presents with nausea and decreased p.o. intake over  the past 5 days but ongoing since February associated with rectal discomfort and intermittent bright red blood per rectum ongoing for months.  Regarding patient's generalized abdominal pain and ongoing reported nonbloody nonbilious emesis, consider hyperemesis, IBS, SBO, enteritis among multiple other etiologies.  Regarding patient ongoing rectal bleeding per patient many months with known history of internal hemorrhoids, suspect likely internal hemorrhoids or possible diverticular bleed.  Patient's rectal exam was unremarkable however positive Hemoccult.  She had no active bleeding or melena on exam.  She had evidence of nonthrombosed external hemorrhoids.  Patient's labs obtained to ensure no evidence of acute electrolyte abnormalities, AKI or severe anemia in the setting of vomiting and bright red blood per rectum.  Her hemoglobin is normocytic anemia hemoglobin 11.2 consistent with baseline.  Her platelets are within normal limits at 354, not concern for coagulopathy.  Creatinine 0.75 within normal limits no evidence of AKI.  Mild hyponatremia 132 and mild hypokalemia 3.4 likely due to fluid losses.  Mild transaminitis AST 115, ALT 131 with normal T. bili 0.5, likely related to known hepatic steatosis and fatty liver disease.  CT abdomen pelvis with contrast obtained which I personally reviewed no evidence of bowel obstruction.  No acute intra-abdominal or pelvic findings.  Patient's symptoms were controlled in the ED and she was tolerating p.o. including her potassium oral repletion.  She had no visualized vomiting.  No visualized rectal bleeding.  He is hemodynamically stable with ongoing bright red blood per rectum for months with stable hemoglobin.  She is safe to follow-up with outpatient GI.  I have started patient on Phenergan and omeprazole which she is in agreement with.  Strict return precautions discussed.  Discharged in good condition.  Patient requesting discharge and feeling  better.  Amount and/or Complexity of Data Reviewed Labs: ordered. Radiology: ordered.  Risk OTC drugs. Prescription drug management.    Final Clinical Impression(s) / ED Diagnoses Final diagnoses:  Rectal bleeding  Nausea and vomiting, unspecified vomiting type    Rx / DC Orders ED Discharge Orders          Ordered    omeprazole (PRILOSEC) 20 MG capsule  Daily        09/25/22 1502    pramoxine (PROCTOFOAM) 1 % foam  3 times daily PRN        09/25/22 1502    promethazine (PHENERGAN) 25 MG tablet  Every 6 hours PRN        09/25/22 1502    Ambulatory referral to Gastroenterology        09/25/22 1502              Mardene Sayer, MD 09/25/22 [id number]

## 2022-09-25 NOTE — ED Notes (Signed)
Patient transported to CT 

## 2022-09-25 NOTE — ED Notes (Signed)
Awaiting DC paperwork. Provider left prior to DC paperwork. EDP made aware.

## 2022-09-25 NOTE — ED Notes (Signed)
Patient pressed her help button and was having a panic attack. EDP made aware and patient medicated for same.

## 2022-09-25 NOTE — Discharge Instructions (Addendum)
s discussed, recommend outpatient follow-up with gastroenterology.  We have sent in a referral for [facility] but I recommend calling their office during office hours tomorrow morning to establish line of communication.  Take Prilosec daily as well as promethazine as needed for nausea/vomiting.  Have also prescribed Proctofoam to help with anal irritation.  Please do not hesitate to return if you notice worsening pain, worsening vomiting, worsening of bleeding or signs of anemia.

## 2022-09-25 NOTE — ED Triage Notes (Signed)
Patient arrives ambulatory by POV reports she has been unable to keep anything down since February and losing weight. States she has hemorrhoids causing rectal bleeding intermittently for years. States she has not had the money to get a colonoscopy.

## 2022-09-26 ENCOUNTER — Ambulatory Visit (HOSPITAL_COMMUNITY): Payer: No Typology Code available for payment source

## 2022-09-27 ENCOUNTER — Ambulatory Visit (HOSPITAL_COMMUNITY): Payer: No Typology Code available for payment source

## 2022-09-30 ENCOUNTER — Ambulatory Visit (HOSPITAL_COMMUNITY): Payer: No Typology Code available for payment source

## 2022-10-01 ENCOUNTER — Ambulatory Visit (HOSPITAL_COMMUNITY): Payer: No Typology Code available for payment source

## 2022-10-02 ENCOUNTER — Ambulatory Visit (HOSPITAL_COMMUNITY): Payer: No Typology Code available for payment source

## 2022-10-03 ENCOUNTER — Ambulatory Visit: Payer: No Typology Code available for payment source | Admitting: Family

## 2022-10-03 ENCOUNTER — Ambulatory Visit (HOSPITAL_COMMUNITY): Payer: No Typology Code available for payment source

## 2022-10-03 ENCOUNTER — Encounter: Payer: Self-pay | Admitting: Family

## 2022-10-03 VITALS — BP 168/102 | HR 120 | Ht 61.0 in | Wt 257.8 lb

## 2022-10-03 DIAGNOSIS — K625 Hemorrhage of anus and rectum: Secondary | ICD-10-CM | POA: Diagnosis not present

## 2022-10-03 DIAGNOSIS — I10 Essential (primary) hypertension: Secondary | ICD-10-CM | POA: Diagnosis not present

## 2022-10-03 MED ORDER — AMLODIPINE BESYLATE 2.5 MG PO TABS: 2.5000 mg | ORAL_TABLET | Freq: Every day | ORAL | 0 refills | Status: AC

## 2022-10-03 MED ORDER — METOPROLOL TARTRATE 25 MG PO TABS: 25.0000 mg | ORAL_TABLET | Freq: Every evening | ORAL | 0 refills | Status: AC

## 2022-10-03 NOTE — Progress Notes (Signed)
Marilyn Fowler is a 47 y.o. female with the following history as recorded in Epic[employer]:  Patient Active Problem List   Diagnosis Date Noted   Community acquired pneumonia 06/10/2022   Nicotine dependence, cigarettes, uncomplicated 06/10/2022   Reactive airway disease with wheezing with acute exacerbation 06/09/2022   Reactive airway disease 06/09/2022   Acute bronchitis 06/09/2022   Bilateral carpal tunnel syndrome 05/01/2021   History of suicide attempt 05/29/2020   Benzodiazepine abuse (HCC) 05/29/2020   Insomnia 05/29/2020   Sprained ankle 01/14/2020   Bipolar depression (HCC) 01/13/2020   MDD (major depressive disorder), recurrent severe, without psychosis (HCC) 10/18/2019   Physical exam 08/07/2017   Headache 06/30/2017   Chronic right shoulder pain 09/04/2016   Hypokalemia 04/30/2016   Hypomagnesemia 04/30/2016   Alcoholic hepatitis 04/30/2016   Alcohol induced fatty liver 04/30/2016   Alcohol abuse 04/29/2016   Left medial knee pain 06/29/2015   Acute bacterial sinusitis 10/11/2014   Maxillary sinusitis, acute 09/14/2014   Gastroenteritis 11/09/2013   Pleuritic chest pain 09/17/2013   Chest pain 11/18/2012   Class 3 severe obesity due to excess calories with serious comorbidity and body mass index (BMI) of 50.0 to 59.9 in adult (HCC) 09/29/2012   Essential hypertension 07/12/2012   Migraine 07/12/2012   Bipolar II disorder, most recent episode major depressive (HCC) 07/11/2012    Current Outpatient Medications  Medication Sig Dispense Refill   BIOTIN 5000 PO Take 5,000 mcg by mouth daily.     Black Cohosh 450 MG CAPS Take 450 mg by mouth daily.     Cholecalciferol (VITAMIN D3 PO) Take 2,000 Units by mouth daily.     citalopram (CELEXA) 20 MG tablet Take 1 tablet (20 mg total) by mouth daily. 30 tablet 2   clonazePAM (KLONOPIN) 1 MG tablet Take 1 mg by mouth 2 (two) times daily.     Multiple Vitamin (MULTIVITAMIN) tablet Take 1 tablet by mouth daily.     omeprazole  (PRILOSEC) 20 MG capsule Take 20 mg by mouth daily.     omeprazole (PRILOSEC) 20 MG capsule Take 1 capsule (20 mg total) by mouth daily. 30 capsule 0   oxycodone-acetaminophen (LYNOX) 7.5-300 MG tablet Take 1 tablet by mouth every 4 (four) hours as needed for pain.     phentermine (ADIPEX-P) 37.5 MG tablet Take 37.5 mg by mouth daily before breakfast.     promethazine (PHENERGAN) 25 MG tablet Take 1 tablet (25 mg total) by mouth every 6 (six) hours as needed for nausea or vomiting. 30 tablet 0   QUEtiapine (SEROQUEL) 25 MG tablet Take 1 tablet (25 mg total) by mouth 3 (three) times daily as needed. 90 tablet 2   QUEtiapine Fumarate (SEROQUEL XR) 150 MG 24 hr tablet Take 1 tablet (150 mg total) by mouth at bedtime. 30 tablet 1   zinc gluconate 50 MG tablet Take 50 mg by mouth daily.     zolpidem (AMBIEN) 10 MG tablet Take 10 mg by mouth at bedtime as needed for sleep.     amLODipine (NORVASC) 2.5 MG tablet Take 1 tablet (2.5 mg total) by mouth daily. 30 tablet 0   ferrous sulfate 325 (65 FE) MG tablet Take 1 tablet (325 mg total) by mouth 3 (three) times daily with meals. (Patient taking differently: Take 325 mg by mouth daily with breakfast.) 90 tablet 0   ibuprofen (ADVIL) 200 MG tablet Take 400 mg by mouth every 4 (four) hours as needed for headache (pain).     methylPREDNISolone (  MEDROL DOSEPAK) 4 MG TBPK tablet Use as directed on the package 21 tablet 0   metoprolol tartrate (LOPRESSOR) 25 MG tablet Take 1 tablet (25 mg total) by mouth every evening. 30 tablet 0   pramoxine (PROCTOFOAM) 1 % foam Place 1 Application rectally 3 (three) times daily as needed for anal itching. 15 g 0   No current facility-administered medications for this visit.    Allergies: Robaxin [methocarbamol], Toradol [ketorolac tromethamine], and Trazodone and nefazodone  Past Medical History:  Diagnosis Date   Alcohol abuse    Anemia    Anemia    Anxiety    Bipolar disorder (HCC)    Blood transfusion without  reported diagnosis    Depression    Hemorrhoid    Hypertension    Migraine     Past Surgical History:  Procedure Laterality Date   HEMORRHOID SURGERY     HERNIA REPAIR     TONSILLECTOMY     VENTRAL HERNIA REPAIR      Family History  Problem Relation Age of Onset   Bipolar disorder Mother    Osteoarthritis Mother    Diabetes Mother    Hypertension Mother    Depression Mother    Hypertension Father    Diabetes Father    ADD / ADHD Brother    Depression Brother    Alcohol abuse Maternal Uncle    Alcohol abuse Maternal Grandfather    Deep vein thrombosis Maternal Grandfather    Deep vein thrombosis Maternal Grandmother    ADD / ADHD Son    Asthma Son    Stroke Neg Hx     Social History   Tobacco Use   Smoking status: Every Day    Years: 10    Types: Cigarettes    Last attempt to quit: 04/12/2020    Years since quitting: 2.4   Smokeless tobacco: Never  Substance Use Topics   Alcohol use: Not Currently    Subjective:   Patient is seen with her mother; presents as a new patient;  Notes she needs "all of her medications" since her previous provider "cut her off over a month ago." Is requesting refills on her blood pressure medication;  She was apparently getting Klonopin, Ambien, Oxycodone, Celexa and Flexeril from her provider at [facility]- no records are available for review. Patient's behavior is very angry/ agitated that she can not get her psychiatric medications- she is adamant that she does not have a psychiatrist but records indicate that she was seen by Dr. Tenny Craw 2 weeks ago and referred for IOP; was dismissed from IOP for non compliance with attendance;  Her mother repeatedly asks that the patient get a work note to allow her to "return to work on Monday" saying that "everything is okay."   Objective:  Vitals:   10/03/22 1336  BP: (!) 168/102  Pulse: (!) 120  SpO2: 98%  Weight: 257 lb 12.8 oz (116.9 kg)  Height: 5\' 1"  (1.549 m)    General: Well developed,  well nourished, in no acute distress  Skin : Warm and dry.  Head: Normocephalic and atraumatic  Lungs: Respirations unlabored;  Neurologic: Alert and oriented; speech intact; face symmetrical; moves all extremities well; CNII-XII intact without focal deficit   Assessment:  1. Hypertension, unspecified type   2. Rectal bleeding     Plan:  When I explained to patient that we could not write a return to work for her or refill pain or psychiatric medications, patient opted to end the  visit and noted she would be finding a new primary care provider as this office was not a good fit. I did ask her about updating a GI referral from her recent ER visit and she deferred. She walked out of the appointment of her own accord. I did give her #30 tablets of her 2 blood pressure medications.   No follow-ups on file.  No orders of the defined types were placed in this encounter.   Requested Prescriptions   Signed Prescriptions Disp Refills   amLODipine (NORVASC) 2.5 MG tablet 30 tablet 0    Sig: Take 1 tablet (2.5 mg total) by mouth daily.   metoprolol tartrate (LOPRESSOR) 25 MG tablet 30 tablet 0    Sig: Take 1 tablet (25 mg total) by mouth every evening.

## 2022-10-04 ENCOUNTER — Ambulatory Visit (HOSPITAL_COMMUNITY): Payer: No Typology Code available for payment source

## 2022-10-07 ENCOUNTER — Ambulatory Visit (HOSPITAL_COMMUNITY): Payer: No Typology Code available for payment source

## 2022-10-08 ENCOUNTER — Ambulatory Visit (HOSPITAL_COMMUNITY): Payer: No Typology Code available for payment source

## 2022-10-09 ENCOUNTER — Ambulatory Visit (HOSPITAL_COMMUNITY): Payer: No Typology Code available for payment source

## 2022-10-10 ENCOUNTER — Ambulatory Visit (HOSPITAL_COMMUNITY): Payer: No Typology Code available for payment source

## 2022-10-11 ENCOUNTER — Ambulatory Visit (HOSPITAL_COMMUNITY): Payer: No Typology Code available for payment source

## 2022-10-14 ENCOUNTER — Ambulatory Visit (HOSPITAL_COMMUNITY): Payer: No Typology Code available for payment source

## 2022-10-15 ENCOUNTER — Ambulatory Visit (HOSPITAL_COMMUNITY): Payer: No Typology Code available for payment source

## 2023-01-16 ENCOUNTER — Emergency Department (HOSPITAL_BASED_OUTPATIENT_CLINIC_OR_DEPARTMENT_OTHER)
Admission: EM | Admit: 2023-01-16 | Discharge: 2023-01-16 | Disposition: A | Discharge status: Home / Self Care | Payer: No Typology Code available for payment source | Attending: Emergency Medicine | Admitting: Emergency Medicine

## 2023-01-16 ENCOUNTER — Encounter (HOSPITAL_BASED_OUTPATIENT_CLINIC_OR_DEPARTMENT_OTHER): Payer: Self-pay

## 2023-01-16 ENCOUNTER — Other Ambulatory Visit: Payer: Self-pay

## 2023-01-16 ENCOUNTER — Emergency Department (HOSPITAL_BASED_OUTPATIENT_CLINIC_OR_DEPARTMENT_OTHER): Payer: No Typology Code available for payment source

## 2023-01-16 DIAGNOSIS — S63501A Unspecified sprain of right wrist, initial encounter: Secondary | ICD-10-CM

## 2023-01-16 DIAGNOSIS — Z79899 Other long term (current) drug therapy: Secondary | ICD-10-CM | POA: Diagnosis not present

## 2023-01-16 DIAGNOSIS — S6991XA Unspecified injury of right wrist, hand and finger(s), initial encounter: Secondary | ICD-10-CM | POA: Diagnosis present

## 2023-01-16 DIAGNOSIS — Y9301 Activity, walking, marching and hiking: Secondary | ICD-10-CM | POA: Insufficient documentation

## 2023-01-16 DIAGNOSIS — W010XXA Fall on same level from slipping, tripping and stumbling without subsequent striking against object, initial encounter: Secondary | ICD-10-CM | POA: Diagnosis not present

## 2023-01-16 DIAGNOSIS — I10 Essential (primary) hypertension: Secondary | ICD-10-CM | POA: Insufficient documentation

## 2023-01-16 DIAGNOSIS — M25561 Pain in right knee: Secondary | ICD-10-CM

## 2023-01-16 DIAGNOSIS — Y92000 Kitchen of unspecified non-institutional (private) residence as  the place of occurrence of the external cause: Secondary | ICD-10-CM | POA: Insufficient documentation

## 2023-01-16 DIAGNOSIS — W19XXXA Unspecified fall, initial encounter: Secondary | ICD-10-CM

## 2023-01-16 NOTE — ED Provider Notes (Signed)
[facility] Provider Note   CSN: [id number] Arrival date & time: 01/16/23  4696     History  Chief Complaint  Patient presents with   Carneisha Akande is a 47 y.o. female history of bipolar 2, migraines, hypertension, anxiety presented for mechanical fall that occurred at 4 AM today.  Patient was walking the kitchen when she slipped on water and landed on her right wrist and right knee.  Patient able to walk since then has taken Tylenol however wanted to be evaluated.  States she still able to move her right wrist and right knee and bear weight however wants x-rays.  Patient denies any prodromal symptoms, hitting her head, blood thinners, LOC, skin color changes, decreased sensation, paresthesias, neck pain, new onset weakness.   Home Medications Prior to Admission medications   Medication Sig Start Date End Date Taking? Authorizing Provider  amLODipine (NORVASC) 2.5 MG tablet Take 1 tablet (2.5 mg total) by mouth daily. 10/03/22   Olive Bass, FNP  BIOTIN 5000 PO Take 5,000 mcg by mouth daily.    [provider]  Black Cohosh 450 MG CAPS Take 450 mg by mouth daily.    [provider]  Cholecalciferol (VITAMIN D3 PO) Take 2,000 Units by mouth daily.    [provider]  citalopram (CELEXA) 20 MG tablet Take 1 tablet (20 mg total) by mouth daily. 09/14/22   Myrlene Broker, MD  clonazePAM (KLONOPIN) 1 MG tablet Take 1 mg by mouth 2 (two) times daily.    [provider]  ferrous sulfate 325 (65 FE) MG tablet Take 1 tablet (325 mg total) by mouth 3 (three) times daily with meals. Patient taking differently: Take 325 mg by mouth daily with breakfast. 11/25/20 08/07/22  Koleen Distance, MD  ibuprofen (ADVIL) 200 MG tablet Take 400 mg by mouth every 4 (four) hours as needed for headache (pain).    [provider]  methylPREDNISolone (MEDROL DOSEPAK) 4 MG TBPK tablet Use as directed on the  package 09/13/22   Cardama, Amadeo Garnet, MD  metoprolol tartrate (LOPRESSOR) 25 MG tablet Take 1 tablet (25 mg total) by mouth every evening. 10/03/22   Olive Bass, FNP  Multiple Vitamin (MULTIVITAMIN) tablet Take 1 tablet by mouth daily.    [provider]  omeprazole (PRILOSEC) 20 MG capsule Take 20 mg by mouth daily. 05/15/22   [provider]  omeprazole (PRILOSEC) 20 MG capsule Take 1 capsule (20 mg total) by mouth daily. 09/25/22 10/25/22  Mardene Sayer, MD  oxycodone-acetaminophen (LYNOX) 7.5-300 MG tablet Take 1 tablet by mouth every 4 (four) hours as needed for pain.    [provider]  phentermine (ADIPEX-P) 37.5 MG tablet Take 37.5 mg by mouth daily before breakfast.    [provider]  pramoxine (PROCTOFOAM) 1 % foam Place 1 Application rectally 3 (three) times daily as needed for anal itching. 09/25/22   Mardene Sayer, MD  promethazine (PHENERGAN) 25 MG tablet Take 1 tablet (25 mg total) by mouth every 6 (six) hours as needed for nausea or vomiting. 09/25/22   Mardene Sayer, MD  QUEtiapine (SEROQUEL) 25 MG tablet Take 1 tablet (25 mg total) by mouth 3 (three) times daily as needed. 09/14/22 09/14/23  Myrlene Broker, MD  QUEtiapine Fumarate (SEROQUEL XR) 150 MG 24 hr tablet Take 1 tablet (150 mg total) by mouth at bedtime. 09/14/22   Myrlene Broker, MD  zinc gluconate 50 MG tablet Take 50 mg by mouth daily.    [provider]  zolpidem (AMBIEN) 10 MG tablet Take 10 mg by mouth at bedtime as needed for sleep.    [provider]  buPROPion (WELLBUTRIN XL) 300 MG 24 hr tablet Take 1 tablet (300 mg total) by mouth daily. 04/03/20 04/03/20  Geoffery Lyons, MD      Allergies    Robaxin [methocarbamol], Toradol [ketorolac tromethamine], and Trazodone and nefazodone    Review of Systems   Review of Systems  Physical Exam Updated Vital Signs BP 126/78 (BP Location: Left Arm)   Pulse (!) 113   Temp 98.1 F (36.7 C)  (Oral)   Resp 16   Ht 5' (1.524 m)   Wt 120.2 kg   SpO2 96%   BMI 51.75 kg/m  Physical Exam Constitutional:      General: She is not in acute distress. Cardiovascular:     Rate and Rhythm: Normal rate.     Pulses: Normal pulses.  Musculoskeletal:     Comments: Right knee: Mildly edematous when compared to left however not warm to palpation and does not have any overlying skin color changes or abnormalities, tenderness when palpating patella however no bony abnormalities palpated, 4 out of 5 knee flexion/extension, negative varus/valgus stress test, negative anterior/posterior drawer test, 5 out of 5 right-sided plantarflexion/dorsiflexion Soft compartments Pain not out of proportion Right wrist: No forearm abnormalities or tenderness, tender palpation on the ulnar aspect of carpal bones without abnormalities, no scaphoid tenderness, 5 out of 5 grip strength, 4 out of 5 wrist flexion/extension, no bony abnormalities palpated in the hand or wrist  Skin:    General: Skin is warm and dry.     Capillary Refill: Capillary refill takes less than 2 seconds.  Neurological:     Mental Status: She is alert.     Comments: Sensation intact distally Able to ambulate however there is mild limp in the right extremity due to knee pain     ED Results / Procedures / Treatments   Labs (all labs ordered are listed, but only abnormal results are displayed) Labs Reviewed - No data to display  EKG None  Radiology DG Knee Complete 4 Views Right  Result Date: 01/16/2023 CLINICAL DATA:  fall.  Pain and bruising over right anterior knee. EXAM: RIGHT KNEE - COMPLETE 4+ VIEW COMPARISON:  06/03/2003. FINDINGS: No acute fracture or dislocation. However, there is ill-defined lucency along the posterosuperior aspect of the patella which may represent focal bone contusion versus artifact. Correlate clinically to determine the need for additional cross-sectional imaging. The knee joint otherwise appears within  normal limits. No significant arthritis. No knee effusion. There is probable mild to moderate suprapatellar soft tissue swelling. No radiopaque foreign bodies. IMPRESSION: *Probable mild to moderate suprapatellar soft tissue swelling. Ill-defined lucency along the posterosuperior aspect of the patella which may represent focal bone contusion versus artifact. Correlate clinically to determine the need for additional cross-sectional imaging. Electronically Signed   By: Jules Schick M.D.   On: 01/16/2023 08:53   DG Wrist Complete Right  Result Date: 01/16/2023 CLINICAL DATA:  fall.  Pain over medial right wrist. EXAM: RIGHT WRIST - COMPLETE 3+ VIEW COMPARISON:  None Available. FINDINGS: Bone mineralization within normal limits for patient's age. No acute fracture or dislocation. No aggressive osseous lesion. Note is made of negative ulnar variance. No significant arthritis of the wrist joint. No radiopaque foreign bodies. Soft tissues are within normal limits.  IMPRESSION: Negative. Electronically Signed   By: Jules Schick M.D.   On: 01/16/2023 08:49    Procedures Procedures    Medications Ordered in ED Medications - No data to display  ED Course/ Medical Decision Making/ A&P                                 Medical Decision Making Amount and/or Complexity of Data Reviewed Radiology: ordered.   Nitika Vales 47 y.o. presented today for right knee and right wrist pain. Working DDx that I considered at this time includes, but not limited to, contusion, strain/sprain, fracture, dislocation, neurovascular compromise, septic joint, ischemic limb, compartment syndrome.  R/o DDx: fracture, dislocation, neurovascular compromise, septic joint, ischemic limb, compartment syndrome: These are considered less likely due to history of present illness, physical exam, labs/imaging findings.  Review of prior external notes: 09/25/2022 ED  Unique Tests and My Interpretation:  Right wrist x-ray:  Unremarkable Right knee x-ray: Ill-defined lucency at the patella most likely contusion with soft tissue swelling  Discussion with Independent Historian:  Family member  Discussion of Management of Tests: None  Risk: Low: based on diagnostic testing/clinical impression and treatment plan  Risk Stratification Score: None  Plan: On exam patient was in no acute distress with stable vitals.  EMR shows that patient is tachycardic at 113 however when I went in the room patient's pulses were normal rate and not tachycardic.  Patient reported slipping on water which is suspicious of mechanical fall and do not suspect patient needs full workup at this time.  Patient did have mild swelling to her right knee with patellar tenderness and when correlating with x-ray from triage suspect she has a bony contusion and will provide knee braces patient has crutches at home.  Patient was neurovasc intact and her right lower extremity as well.  Patient's right wrist exam shows tenderness in the ulnar aspect over carpal bones however she has no gross intact and had a reassuring physical exam.  Suspect patient may have sprained her wrist given the negative x-ray along with reassuring physical exam.  Will provide wrist brace.  Patient did have Tylenol before coming in and so I encouraged her to continue using her Tylenol every 6 hours and ice her joints as well and rest over the next few days.  Patient does see orthopedics as she has history of arthritis in her right knee and so I encouraged her to follow-up with her orthopedist if her symptoms persist.  Patient was given return precautions. Patient stable for discharge at this time.  Patient verbalized understanding of plan.         Final Clinical Impression(s) / ED Diagnoses Final diagnoses:  Fall, initial encounter  Sprain of right wrist, initial encounter  Acute pain of right knee    Rx / DC Orders ED Discharge Orders     None         Remi Deter 01/16/23 0981    Terald Sleeper, MD 01/16/23 1000

## 2023-01-16 NOTE — ED Triage Notes (Signed)
In for eval for right knee pain and right wrist pain sec to slip and fall on water in the kitchen at approx 0400 this am. Denies hitting her head.

## 2023-01-16 NOTE — Discharge Instructions (Signed)
Please follow-up with your orthopedic specialist in regards to recent symptoms and ER visit.  Today your x-ray showed that you have a contusion or bruise on your right kneecap and your physical exam shows you most likely sprained your right wrist.  Please use the braces I have provided here for you.  You may take Tylenol every 6 hours and ice your joints 3-4 times daily for 15 to 20 minutes at a time.  Please elevate your joints as well and rest over the next few days.  If symptoms change or worsen please return to ER.

## 2023-01-17 ENCOUNTER — Other Ambulatory Visit (HOSPITAL_COMMUNITY): Payer: Self-pay | Admitting: Psychiatry

## 2023-02-20 ENCOUNTER — Emergency Department (HOSPITAL_BASED_OUTPATIENT_CLINIC_OR_DEPARTMENT_OTHER): Payer: No Typology Code available for payment source

## 2023-02-20 ENCOUNTER — Emergency Department (HOSPITAL_BASED_OUTPATIENT_CLINIC_OR_DEPARTMENT_OTHER)
Admission: EM | Admit: 2023-02-20 | Discharge: 2023-02-20 | Disposition: A | Discharge status: Home / Self Care | Payer: No Typology Code available for payment source | Attending: Emergency Medicine | Admitting: Emergency Medicine

## 2023-02-20 ENCOUNTER — Encounter (HOSPITAL_BASED_OUTPATIENT_CLINIC_OR_DEPARTMENT_OTHER): Payer: Self-pay | Admitting: Emergency Medicine

## 2023-02-20 ENCOUNTER — Other Ambulatory Visit: Payer: Self-pay

## 2023-02-20 DIAGNOSIS — R531 Weakness: Secondary | ICD-10-CM | POA: Insufficient documentation

## 2023-02-20 DIAGNOSIS — Z79899 Other long term (current) drug therapy: Secondary | ICD-10-CM | POA: Diagnosis not present

## 2023-02-20 DIAGNOSIS — I10 Essential (primary) hypertension: Secondary | ICD-10-CM | POA: Insufficient documentation

## 2023-02-20 DIAGNOSIS — R55 Syncope and collapse: Secondary | ICD-10-CM | POA: Diagnosis not present

## 2023-02-20 DIAGNOSIS — R5383 Other fatigue: Secondary | ICD-10-CM | POA: Diagnosis present

## 2023-02-20 DIAGNOSIS — Z20822 Contact with and (suspected) exposure to covid-19: Secondary | ICD-10-CM | POA: Insufficient documentation

## 2023-02-20 LAB — RESP PANEL BY RT-PCR (RSV, FLU A&B, COVID)  RVPGX2
Influenza A by PCR: NEGATIVE
Influenza B by PCR: NEGATIVE
Resp Syncytial Virus by PCR: NEGATIVE
SARS Coronavirus 2 by RT PCR: NEGATIVE

## 2023-02-20 LAB — D-DIMER, QUANTITATIVE: D-Dimer, Quant: 0.38 ug{FEU}/mL (ref 0.00–0.50)

## 2023-02-20 LAB — CBC WITH DIFFERENTIAL/PLATELET
Abs Immature Granulocytes: 0.02 10*3/uL (ref 0.00–0.07)
Basophils Absolute: 0 10*3/uL (ref 0.0–0.1)
Basophils Relative: 0 %
Eosinophils Absolute: 0.1 10*3/uL (ref 0.0–0.5)
Eosinophils Relative: 2 %
HCT: 32.7 % — ABNORMAL LOW (ref 36.0–46.0)
Hemoglobin: 9.7 g/dL — ABNORMAL LOW (ref 12.0–15.0)
Immature Granulocytes: 0 %
Lymphocytes Relative: 35 %
Lymphs Abs: 2.2 10*3/uL (ref 0.7–4.0)
MCH: 22.9 pg — ABNORMAL LOW (ref 26.0–34.0)
MCHC: 29.7 g/dL — ABNORMAL LOW (ref 30.0–36.0)
MCV: 77.3 fL — ABNORMAL LOW (ref 80.0–100.0)
Monocytes Absolute: 0.6 10*3/uL (ref 0.1–1.0)
Monocytes Relative: 9 %
Neutro Abs: 3.4 10*3/uL (ref 1.7–7.7)
Neutrophils Relative %: 54 %
Platelets: 342 10*3/uL (ref 150–400)
RBC: 4.23 MIL/uL (ref 3.87–5.11)
RDW: 18.6 % — ABNORMAL HIGH (ref 11.5–15.5)
WBC: 6.3 10*3/uL (ref 4.0–10.5)
nRBC: 0 % (ref 0.0–0.2)

## 2023-02-20 LAB — COMPREHENSIVE METABOLIC PANEL
ALT: 29 U/L (ref 0–44)
AST: 30 U/L (ref 15–41)
Albumin: 4.4 g/dL (ref 3.5–5.0)
Alkaline Phosphatase: 69 U/L (ref 38–126)
Anion gap: 9 (ref 5–15)
BUN: 11 mg/dL (ref 6–20)
CO2: 21 mmol/L — ABNORMAL LOW (ref 22–32)
Calcium: 8.8 mg/dL — ABNORMAL LOW (ref 8.9–10.3)
Chloride: 106 mmol/L (ref 98–111)
Creatinine, Ser: 0.72 mg/dL (ref 0.44–1.00)
GFR, Estimated: >60 mL/min (ref >60)
Glucose, Bld: 89 mg/dL (ref 70–99)
Potassium: 3.9 mmol/L (ref 3.5–5.1)
Sodium: 136 mmol/L (ref 135–145)
Total Bilirubin: 0.5 mg/dL (ref 0.3–1.2)
Total Protein: 7.1 g/dL (ref 6.5–8.1)

## 2023-02-20 LAB — TSH: TSH: 0.453 u[IU]/mL (ref 0.350–4.500)

## 2023-02-20 LAB — TROPONIN I (HIGH SENSITIVITY): Troponin I (High Sensitivity): <2 ng/L (ref <18)

## 2023-02-20 LAB — T4, FREE: Free T4: 0.62 ng/dL (ref 0.61–1.12)

## 2023-02-20 NOTE — Discharge Instructions (Addendum)
I recommend close follow-up with PCP/OBGYN for reevaluation. Please remember to to go to your appointment tomorrow. Do not hesitate to return to emergency department if worrisome signs symptoms we discussed become apparent.

## 2023-02-20 NOTE — ED Notes (Signed)
Pt advised she's not willing to try to urinate without something PO. Would not attempt to acquire UA.

## 2023-02-20 NOTE — ED Triage Notes (Signed)
Pt having severe fatigue.  Pt unable to get to her iron infusions due to fatigue.  Pt having symptoms of fatigue, sob on exertion and dizziness.  Pt also fell several times and pt states she had a near-syncopal event today, no head injuty.

## 2023-02-20 NOTE — ED Notes (Addendum)
Discharge paperwork reviewed entirely with patient, including follow up care. Pain was under control. No prescriptions were called in, but all questions were addressed.  Pt verbalized understanding as well as all parties involved. No questions or concerns voiced at the time of discharge. No acute distress noted.   Pt ambulated out to PVA without incident or assistance.  Pt advised they will notify their PCP immediately. Pt walked out of the ER without shoes on.

## 2023-02-20 NOTE — ED Provider Notes (Signed)
[place] [facility] Provider Note   CSN: [id number] Arrival date & time: 02/20/23  1106     History  Chief Complaint  Patient presents with   Fatigue    Marilyn Fowler is a 47 y.o. female with a past medical history of anemia on iron infusion, bipolar disorder, hypertension, migraine presents today for evaluation of generalized weakness and syncope.  Patient had 1 syncopal episode earlier today while standing.  Denies hitting her head.  States she felt dizzy, and had room spinning sensation before the syncopal episode.  She also reports some chest pain and shortness of breath.  Patient states that she missed her iron infusion last time but has one scheduled for tomorrow.  Endorses cough, runny nose, nasal congestions.  Denies any fever, nausea, vomiting, vision changes, weakness, numbness, abdominal pain, bowel change, urinary symptoms, rash.  HPI  Past Medical History:  Diagnosis Date   Alcohol abuse    Anemia    Anemia    Anxiety    Bipolar disorder (HCC)    Blood transfusion without reported diagnosis    Depression    Hemorrhoid    Hypertension    Migraine    Past Surgical History:  Procedure Laterality Date   HEMORRHOID SURGERY     HERNIA REPAIR     TONSILLECTOMY     VENTRAL HERNIA REPAIR       Home Medications Prior to Admission medications   Medication Sig Start Date End Date Taking? Authorizing Provider  amLODipine (NORVASC) 2.5 MG tablet Take 1 tablet (2.5 mg total) by mouth daily. 10/03/22   Olive Bass, FNP  BIOTIN 5000 PO Take 5,000 mcg by mouth daily.    [provider]  Black Cohosh 450 MG CAPS Take 450 mg by mouth daily.    [provider]  Cholecalciferol (VITAMIN D3 PO) Take 2,000 Units by mouth daily.    [provider]  citalopram (CELEXA) 20 MG tablet Take 1 tablet (20 mg total) by mouth daily. 09/14/22   Myrlene Broker, MD  clonazePAM (KLONOPIN) 1 MG tablet Take 1 mg by  mouth 2 (two) times daily.    [provider]  ferrous sulfate 325 (65 FE) MG tablet Take 1 tablet (325 mg total) by mouth 3 (three) times daily with meals. Patient taking differently: Take 325 mg by mouth daily with breakfast. 11/25/20 08/07/22  Koleen Distance, MD  ibuprofen (ADVIL) 200 MG tablet Take 400 mg by mouth every 4 (four) hours as needed for headache (pain).    [provider]  methylPREDNISolone (MEDROL DOSEPAK) 4 MG TBPK tablet Use as directed on the package 09/13/22   Cardama, Amadeo Garnet, MD  metoprolol tartrate (LOPRESSOR) 25 MG tablet Take 1 tablet (25 mg total) by mouth every evening. 10/03/22   Olive Bass, FNP  Multiple Vitamin (MULTIVITAMIN) tablet Take 1 tablet by mouth daily.    [provider]  omeprazole (PRILOSEC) 20 MG capsule Take 20 mg by mouth daily. 05/15/22   [provider]  omeprazole (PRILOSEC) 20 MG capsule Take 1 capsule (20 mg total) by mouth daily. 09/25/22 10/25/22  Mardene Sayer, MD  oxycodone-acetaminophen (LYNOX) 7.5-300 MG tablet Take 1 tablet by mouth every 4 (four) hours as needed for pain.    [provider]  phentermine (ADIPEX-P) 37.5 MG tablet Take 37.5 mg by mouth daily before breakfast.    [provider]  pramoxine (PROCTOFOAM) 1 % foam Place 1 Application rectally  3 (three) times daily as needed for anal itching. 09/25/22   Mardene Sayer, MD  promethazine (PHENERGAN) 25 MG tablet Take 1 tablet (25 mg total) by mouth every 6 (six) hours as needed for nausea or vomiting. 09/25/22   Mardene Sayer, MD  QUEtiapine (SEROQUEL) 25 MG tablet Take 1 tablet (25 mg total) by mouth 3 (three) times daily as needed. 09/14/22 09/14/23  Myrlene Broker, MD  QUEtiapine Fumarate (SEROQUEL XR) 150 MG 24 hr tablet Take 1 tablet (150 mg total) by mouth at bedtime. 09/14/22   Myrlene Broker, MD  zinc gluconate 50 MG tablet Take 50 mg by mouth daily.    [provider]  zolpidem (AMBIEN) 10  MG tablet Take 10 mg by mouth at bedtime as needed for sleep.    [provider]  buPROPion (WELLBUTRIN XL) 300 MG 24 hr tablet Take 1 tablet (300 mg total) by mouth daily. 04/03/20 04/03/20  Geoffery Lyons, MD      Allergies    Ketorolac tromethamine, Methocarbamol, and Trazodone and nefazodone    Review of Systems   Review of Systems Negative except as per HPI.  Physical Exam Updated Vital Signs BP 119/84   Pulse (!) 101   Temp (!) 96.8 F (36 C)   Resp 20   Ht 5' (1.524 m)   Wt 109.3 kg   LMP 02/15/2023   SpO2 95%   BMI 47.07 kg/m  Physical Exam Vitals and nursing note reviewed.  Constitutional:      Appearance: Normal appearance.  HENT:     Head: Normocephalic and atraumatic.     Mouth/Throat:     Mouth: Mucous membranes are moist.  Eyes:     General: No scleral icterus. Cardiovascular:     Rate and Rhythm: Normal rate and regular rhythm.     Pulses: Normal pulses.     Heart sounds: Normal heart sounds.  Pulmonary:     Effort: Pulmonary effort is normal.     Breath sounds: Normal breath sounds.  Abdominal:     General: Abdomen is flat.     Palpations: Abdomen is soft.     Tenderness: There is no abdominal tenderness.  Musculoskeletal:        General: No deformity.  Skin:    General: Skin is warm.     Findings: No rash.  Neurological:     General: No focal deficit present.     Mental Status: She is alert.     Comments: Cranial nerves II through XII intact. Intact sensation to light touch in all 4 extremities. 5/5 strength in all 4 extremities. Intact finger-to-nose and heel-to-shin of all 4 extremities. No visual field cuts. No neglect noted. No aphasia noted.  Psychiatric:        Mood and Affect: Mood normal.     ED Results / Procedures / Treatments   Labs (all labs ordered are listed, but only abnormal results are displayed) Labs Reviewed  RESP PANEL BY RT-PCR (RSV, FLU A&B, COVID)  RVPGX2  PREGNANCY, URINE  CBC WITH DIFFERENTIAL/PLATELET   COMPREHENSIVE METABOLIC PANEL  TSH  T4, FREE  URINALYSIS, W/ REFLEX TO CULTURE (INFECTION SUSPECTED)  D-DIMER, QUANTITATIVE  TROPONIN I (HIGH SENSITIVITY)    EKG None  Radiology No results found.  Procedures Procedures    Medications Ordered in ED Medications - No data to display  ED Course/ Medical Decision Making/ A&P  Medical Decision Making Amount and/or Complexity of Data Reviewed Labs: ordered. Radiology: ordered.   This patient presents to the ED for weakness, dizziness, near syncope, this involves an extensive number of treatment options, and is a complaint that carries with a high risk of complications and morbidity.  The differential diagnosis includes anemia, hypoglycemia, arrhythmia, valvular disorders, PE, subarachnoid hemorrhage, orthostatic, dehydration, seizure, vasovagal.  This is not an exhaustive list.  Lab tests: I ordered and personally interpreted labs.  The pertinent results include: WBC unremarkable. Hbg unremarkable. Platelets unremarkable. Electrolytes unremarkable. BUN, creatinine unremarkable.  TSH, T4, T3 ordered and pending.  D-dimer is negative.  Troponin is negative.  Imaging studies: I ordered imaging studies, personally reviewed, interpreted imaging and agree with the radiologist's interpretations. The results include: Negative chest x-ray  Problem list/ ED course/ Critical interventions/ Medical management: HPI: See above Vital signs within normal range and stable throughout visit. Laboratory/imaging studies significant for: See above. On physical examination, patient is afebrile and appears in no acute distress.  CBC is significant for hemoglobin of 9.7 which is downtrending from 4 months ago (11.2).  Patient has a history of bleeding from hemorrhoid, no change recently.  No evidence of volume overload to concern for heart failure.  Unlikely seizure, no witnessed seizure-like activity, no postictal  period.  There was no focal neurologic deficit on my examination, unlikely stroke.  Unlikely ACS/MI, troponin is negative, EKG with no ischemic changes.  Patient is low risk, D-dimer is negative so I have low suspicions for PE. Based on [place] syncope rule, patient is low risk and well-appearing here, plan to discharge the patient home with PCP follow-up. I have reviewed the patient home medicines and have made adjustments as needed.  Cardiac monitoring/EKG: The patient was maintained on a cardiac monitor.  I personally reviewed and interpreted the cardiac monitor which showed an underlying rhythm of: sinus rhythm.  Additional history obtained: External records from outside source obtained and reviewed including: Chart review including previous notes, labs, imaging.  Consultations obtained:  Disposition Continued outpatient therapy. Follow-up with PCP recommended for reevaluation of symptoms. Treatment plan discussed with patient.  Pt acknowledged understanding was agreeable to the plan. Worrisome signs and symptoms were discussed with patient, and patient acknowledged understanding to return to the ED if they noticed these signs and symptoms. Patient was stable upon discharge.   This chart was dictated using voice recognition software.  Despite best efforts to proofread,  errors can occur which can change the documentation meaning.          Final Clinical Impression(s) / ED Diagnoses Final diagnoses:  Other fatigue  Near syncope    Rx / DC Orders ED Discharge Orders     None         Jeanelle Malling, [place] 02/20/23 1854    Alvira Monday, MD 02/24/23 [phone]

## 2023-02-20 NOTE — ED Notes (Signed)
Pt advised she wants to leave, PA informed.

## 2023-03-14 ENCOUNTER — Emergency Department (HOSPITAL_COMMUNITY)
Admission: EM | Admit: 2023-03-14 | Discharge: 2023-03-15 | Disposition: A | Discharge status: Home / Self Care | Payer: No Typology Code available for payment source | Attending: Emergency Medicine | Admitting: Emergency Medicine

## 2023-03-14 ENCOUNTER — Emergency Department (HOSPITAL_COMMUNITY): Payer: No Typology Code available for payment source

## 2023-03-14 ENCOUNTER — Other Ambulatory Visit: Payer: Self-pay

## 2023-03-14 DIAGNOSIS — R519 Headache, unspecified: Secondary | ICD-10-CM | POA: Insufficient documentation

## 2023-03-14 DIAGNOSIS — R569 Unspecified convulsions: Secondary | ICD-10-CM | POA: Diagnosis present

## 2023-03-14 DIAGNOSIS — Z79899 Other long term (current) drug therapy: Secondary | ICD-10-CM | POA: Diagnosis not present

## 2023-03-14 DIAGNOSIS — R55 Syncope and collapse: Secondary | ICD-10-CM | POA: Diagnosis not present

## 2023-03-14 DIAGNOSIS — I10 Essential (primary) hypertension: Secondary | ICD-10-CM | POA: Insufficient documentation

## 2023-03-14 LAB — CBC WITH DIFFERENTIAL/PLATELET
Abs Immature Granulocytes: 0.02 10*3/uL (ref 0.00–0.07)
Basophils Absolute: 0 10*3/uL (ref 0.0–0.1)
Basophils Relative: 0 %
Eosinophils Absolute: 0.1 10*3/uL (ref 0.0–0.5)
Eosinophils Relative: 2 %
HCT: 31.7 % — ABNORMAL LOW (ref 36.0–46.0)
Hemoglobin: 9.5 g/dL — ABNORMAL LOW (ref 12.0–15.0)
Immature Granulocytes: 0 %
Lymphocytes Relative: 20 %
Lymphs Abs: 1.3 10*3/uL (ref 0.7–4.0)
MCH: 23 pg — ABNORMAL LOW (ref 26.0–34.0)
MCHC: 30 g/dL (ref 30.0–36.0)
MCV: 76.8 fL — ABNORMAL LOW (ref 80.0–100.0)
Monocytes Absolute: 0.7 10*3/uL (ref 0.1–1.0)
Monocytes Relative: 10 %
Neutro Abs: 4.7 10*3/uL (ref 1.7–7.7)
Neutrophils Relative %: 68 %
Platelets: 214 10*3/uL (ref 150–400)
RBC: 4.13 MIL/uL (ref 3.87–5.11)
RDW: 18.5 % — ABNORMAL HIGH (ref 11.5–15.5)
WBC: 6.8 10*3/uL (ref 4.0–10.5)
nRBC: 0 % (ref 0.0–0.2)

## 2023-03-14 LAB — COMPREHENSIVE METABOLIC PANEL
ALT: 15 U/L (ref 0–44)
AST: 21 U/L (ref 15–41)
Albumin: 3.4 g/dL — ABNORMAL LOW (ref 3.5–5.0)
Alkaline Phosphatase: 69 U/L (ref 38–126)
Anion gap: 8 (ref 5–15)
BUN: 13 mg/dL (ref 6–20)
CO2: 21 mmol/L — ABNORMAL LOW (ref 22–32)
Calcium: 8.9 mg/dL (ref 8.9–10.3)
Chloride: 108 mmol/L (ref 98–111)
Creatinine, Ser: 0.87 mg/dL (ref 0.44–1.00)
GFR, Estimated: >60 mL/min (ref >60)
Glucose, Bld: 92 mg/dL (ref 70–99)
Potassium: 3.7 mmol/L (ref 3.5–5.1)
Sodium: 137 mmol/L (ref 135–145)
Total Bilirubin: <0.2 mg/dL (ref <1.2)
Total Protein: 6.1 g/dL — ABNORMAL LOW (ref 6.5–8.1)

## 2023-03-14 LAB — I-STAT CG4 LACTIC ACID, ED: Lactic Acid, Venous: 1.5 mmol/L (ref 0.5–1.9)

## 2023-03-14 LAB — CBG MONITORING, ED: Glucose-Capillary: 121 mg/dL — ABNORMAL HIGH (ref 70–99)

## 2023-03-14 LAB — MAGNESIUM: Magnesium: 1.9 mg/dL (ref 1.7–2.4)

## 2023-03-14 LAB — HCG, SERUM, QUALITATIVE: Preg, Serum: NEGATIVE

## 2023-03-14 MED ORDER — IBUPROFEN 400 MG PO TABS
400.0000 mg | ORAL_TABLET | Freq: Once | ORAL | Status: AC
  Administered 2023-03-14: 400 mg via ORAL
  Filled 2023-03-14: qty 1

## 2023-03-14 MED ORDER — LACTATED RINGERS IV BOLUS
1000.0000 mL | Freq: Once | INTRAVENOUS | Status: AC
  Administered 2023-03-14: 1000 mL via INTRAVENOUS

## 2023-03-14 MED ORDER — DIPHENHYDRAMINE HCL 50 MG/ML IJ SOLN
50.0000 mg | Freq: Once | INTRAMUSCULAR | Status: AC
  Administered 2023-03-14: 50 mg via INTRAVENOUS
  Filled 2023-03-14: qty 1

## 2023-03-14 MED ORDER — METOCLOPRAMIDE HCL 5 MG/ML IJ SOLN
10.0000 mg | Freq: Once | INTRAMUSCULAR | Status: AC
  Administered 2023-03-14: 10 mg via INTRAVENOUS
  Filled 2023-03-14: qty 2

## 2023-03-14 NOTE — ED Triage Notes (Signed)
c 

## 2023-03-14 NOTE — ED Triage Notes (Signed)
[place] where the pts mother reports that her daughter appeared to be having a seizure the ems did not wtiness any seizure activity but hat the pt was sl confused for a few minutes.. on arrival to the ed  pt alert and appears to be oriented no tongue damage no incontinence  the pt report that she has not been ill has no  past illnesses

## 2023-03-14 NOTE — ED Notes (Signed)
Patient verbalizes understanding of discharge instructions. Opportunity for questioning and answers were provided. Armband removed by staff, pt discharged from ED. Wheeled out to lobby with family member

## 2023-03-14 NOTE — ED Provider Notes (Signed)
[place] EMERGENCY DEPARTMENT AT [facility] Provider Note   CSN: [id number] Arrival date & time: 03/14/23  2027     History  Chief Complaint  Patient presents with   Seizures    Marilyn Fowler is a 47 y.o. female with PMH bipolar 2, HTN, anxiety, syncope, recent ED evaluation for syncope here for seizure. At 7 PM patient was witnessed by mother to have seizure activity consisting of full body jerking for 45 seconds - 1 minute. Patient was confused/"out of it" for 10-15 minutes before returning to baseline. No incontinence. No screaming/crying during event. Patient was breathing shallow during event per mother. Never had a seizure. No trauma or head injury prior to seizure. Had a headache before it started. No chest pain or palpitations or SOB before it started. No FH seizures. No recent travel. No hx diabetes. Patient currently feels like she is slurring her speech and is having memory issues. Normal PO tolerance. No illnesses.  No drug use or EtOH use, no EtOH withdrawal history.   The history is provided by the patient and medical records.  Seizures      Home Medications Prior to Admission medications   Medication Sig Start Date End Date Taking? Authorizing Provider  amLODipine (NORVASC) 2.5 MG tablet Take 1 tablet (2.5 mg total) by mouth daily. 10/03/22   Olive Bass, FNP  BIOTIN 5000 PO Take 5,000 mcg by mouth daily.    [provider]  Black Cohosh 450 MG CAPS Take 450 mg by mouth daily.    [provider]  Cholecalciferol (VITAMIN D3 PO) Take 2,000 Units by mouth daily.    [provider]  citalopram (CELEXA) 20 MG tablet Take 1 tablet (20 mg total) by mouth daily. 09/14/22   Myrlene Broker, MD  clonazePAM (KLONOPIN) 1 MG tablet Take 1 mg by mouth 2 (two) times daily.    [provider]  ferrous sulfate 325 (65 FE) MG tablet Take 1 tablet (325 mg total) by mouth 3 (three) times daily with meals. Patient taking  differently: Take 325 mg by mouth daily with breakfast. 11/25/20 08/07/22  Koleen Distance, MD  ibuprofen (ADVIL) 200 MG tablet Take 400 mg by mouth every 4 (four) hours as needed for headache (pain).    [provider]  methylPREDNISolone (MEDROL DOSEPAK) 4 MG TBPK tablet Use as directed on the package 09/13/22   Cardama, Amadeo Garnet, MD  metoprolol tartrate (LOPRESSOR) 25 MG tablet Take 1 tablet (25 mg total) by mouth every evening. 10/03/22   Olive Bass, FNP  Multiple Vitamin (MULTIVITAMIN) tablet Take 1 tablet by mouth daily.    [provider]  omeprazole (PRILOSEC) 20 MG capsule Take 20 mg by mouth daily. 05/15/22   [provider]  omeprazole (PRILOSEC) 20 MG capsule Take 1 capsule (20 mg total) by mouth daily. 09/25/22 10/25/22  Mardene Sayer, MD  oxycodone-acetaminophen (LYNOX) 7.5-300 MG tablet Take 1 tablet by mouth every 4 (four) hours as needed for pain.    [provider]  phentermine (ADIPEX-P) 37.5 MG tablet Take 37.5 mg by mouth daily before breakfast.    [provider]  pramoxine (PROCTOFOAM) 1 % foam Place 1 Application rectally 3 (three) times daily as needed for anal itching. 09/25/22   Mardene Sayer, MD  promethazine (PHENERGAN) 25 MG tablet Take 1 tablet (25 mg total) by mouth every 6 (six) hours as needed for nausea or vomiting. 09/25/22   Mardene Sayer, MD  QUEtiapine (SEROQUEL) 25 MG tablet Take 1 tablet (25 mg total) by mouth 3 (three) times daily as needed. 09/14/22 09/14/23  Myrlene Broker, MD  QUEtiapine Fumarate (SEROQUEL XR) 150 MG 24 hr tablet Take 1 tablet (150 mg total) by mouth at bedtime. 09/14/22   Myrlene Broker, MD  zinc gluconate 50 MG tablet Take 50 mg by mouth daily.    [provider]  zolpidem (AMBIEN) 10 MG tablet Take 10 mg by mouth at bedtime as needed for sleep.    [provider]  buPROPion (WELLBUTRIN XL) 300 MG 24 hr tablet Take 1 tablet (300 mg total) by mouth daily.  04/03/20 04/03/20  Geoffery Lyons, MD      Allergies    Ketorolac tromethamine, Methocarbamol, and Trazodone and nefazodone    Review of Systems   Review of Systems  Neurological:  Positive for seizures.    Physical Exam Updated Vital Signs BP 95/60   Pulse 82   Resp 20   Ht 5' (1.524 m)   Wt 109.3 kg   LMP 02/15/2023   SpO2 97%   BMI 47.06 kg/m  Physical Exam Constitutional:      Appearance: Normal appearance.  HENT:     Head: Normocephalic and atraumatic.     Nose: Nose normal.     Mouth/Throat:     Mouth: Mucous membranes are moist.     Comments: No tongue biting Eyes:     Extraocular Movements: Extraocular movements intact.     Pupils: Pupils are equal, round, and reactive to light.  Cardiovascular:     Rate and Rhythm: Normal rate and regular rhythm.     Pulses: Normal pulses.     Heart sounds: Normal heart sounds.  Pulmonary:     Effort: Pulmonary effort is normal.     Breath sounds: Normal breath sounds.  Abdominal:     General: There is no distension.     Palpations: Abdomen is soft.     Tenderness: There is no abdominal tenderness. There is no guarding or rebound.  Musculoskeletal:        General: No signs of injury.     Cervical back: Neck supple. No rigidity or tenderness.     Right lower leg: No edema.     Left lower leg: No edema.  Skin:    General: Skin is warm and dry.  Neurological:     General: No focal deficit present.     Mental Status: She is alert and oriented to person, place, and time. Mental status is at baseline.     GCS: GCS eye subscore is 4. GCS verbal subscore is 5. GCS motor subscore is 6.     Cranial Nerves: No cranial nerve deficit.     Sensory: No sensory deficit.     Motor: No weakness or tremor.     Coordination: Coordination normal. Finger-Nose-Finger Test and Heel to Shin Test normal.     Comments: Normal speech     ED Results / Procedures / Treatments   Labs (all labs ordered are listed, but only abnormal results are  displayed) Labs Reviewed  COMPREHENSIVE METABOLIC PANEL - Abnormal; Notable for the following components:      Result Value   CO2 21 (*)    Total Protein 6.1 (*)    Albumin 3.4 (*)    All other components within normal limits  CBC WITH DIFFERENTIAL/PLATELET - Abnormal; Notable for the following components:   Hemoglobin 9.5 (*)  HCT 31.7 (*)    MCV 76.8 (*)    MCH 23.0 (*)    RDW 18.5 (*)    All other components within normal limits  CBG MONITORING, ED - Abnormal; Notable for the following components:   Glucose-Capillary 121 (*)    All other components within normal limits  MAGNESIUM  HCG, SERUM, QUALITATIVE  RAPID URINE DRUG SCREEN, HOSP PERFORMED  URINALYSIS, ROUTINE W REFLEX MICROSCOPIC  I-STAT CG4 LACTIC ACID, ED    EKG None  Radiology CT HEAD WO CONTRAST  Result Date: 03/14/2023 CLINICAL DATA:  Seizures EXAM: CT HEAD WITHOUT CONTRAST TECHNIQUE: Contiguous axial images were obtained from the base of the skull through the vertex without intravenous contrast. RADIATION DOSE REDUCTION: This exam was performed according to the departmental dose-optimization program which includes automated exposure control, adjustment of the mA and/or kV according to patient size and/or use of iterative reconstruction technique. COMPARISON:  02/23/2023 FINDINGS: Brain: No acute intracranial abnormality. Specifically, no hemorrhage, hydrocephalus, mass lesion, acute infarction, or significant intracranial injury. Vascular: No hyperdense vessel or unexpected calcification. Skull: No acute calvarial abnormality. Sinuses/Orbits: No acute findings Other: None IMPRESSION: Normal study. Electronically Signed   By: Charlett Nose M.D.   On: 03/14/2023 22:29    Procedures Procedures    Medications Ordered in ED Medications  lactated ringers bolus 1,000 mL (1,000 mLs Intravenous New Bag/Given 03/14/23 2218)  ibuprofen (ADVIL) tablet 400 mg (400 mg Oral Given 03/14/23 2219)  metoCLOPramide (REGLAN)  injection 10 mg (10 mg Intravenous Given 03/14/23 2224)  diphenhydrAMINE (BENADRYL) injection 50 mg (50 mg Intravenous Given 03/14/23 2222)    ED Course/ Medical Decision Making/ A&P                                 Medical Decision Making Amount and/or Complexity of Data Reviewed Labs: ordered. Radiology: ordered.  Risk Prescription drug management.   47 year old female with no seizure history presents after a first-time witnessed generalized tonic-clonic seizure lasting 1 minute followed by a 10-minute postictal state.  Patient returned to baseline according to a family member though patient subjectively reports still feeling speech difficulties.  On her neurologic exam she has no appreciable deficit including speech. DDX Considered includes first time epileptic seizure, brain mass, intracranial hemorrhage, CNS infection, hypoglycemia, electrolyte or metabolic derangement, intoxication or withdrawal, or possibly syncope.  The description of the event sounds more consistent with a seizure than syncope and additionally there was no chest pain shortness of breath palpitations before the event.  I do not suspect this is drug related or withdrawal related either as she does not have a history of drug use or withdrawal and clinically does not appear to be intoxicated or in alcohol withdrawal. Patient was worked up for a first time seizure with CT head without contrast, labs, EKG, POC glucose. Seizure precautions placed.   Labs notable for no leukocytosis, hemoglobin stable from previous, no electrolyte or metabolic derangements that would cause a seizure on CMP, normal glucose of 121, normal lactate of 1.5 (which does not necessarily mean this was not a seizure; this lab was drawn several hours after the event so would expect it to be normal by this time anyway), negative pregnancy  EKG shows NSR rate of 88, leftward axis, normal intervals, no ST segment elevations or depressions, pathologic T wave  inversions  CT head without contrast with no intracranial abnormality   I discussed the patient with  neurologist on-call Dr. Jerrell Belfast.  Based on patient's history and current event and reassuring workup he does not recommend initiation of antiseizure medications.  Patient will follow-up with neurology in 4 to 6 weeks.  Phone number to their clinic provided in discharge paperwork.   In the ED patient was complaining of a migraine type headache that had been present for several days.  It is similar to previous migraines that she has had.  She was treated with a migraine cocktail including a fluid bolus, ibuprofen, Reglan, Benadryl.  On reassessment headache is improved.   Stable appropriate for discharge.  Neurology clinic follow-up as above.  Neurologic exam without deficits.  Strict return precautions were discussed.  Seizure precautions were discussed, patient not to drive or swim x6 months or until cleared by neurology.  Patient understands these limitations.  Patient was then discharged in stable condition ambulatory without deficit.          Final Clinical Impression(s) / ED Diagnoses Final diagnoses:  Seizure (HCC)    Rx / DC Orders ED Discharge Orders          Ordered    Ambulatory referral to Neurology       Comments: An appointment is requested in approximately: 4 weeks   03/14/23 2250              Karmen Stabs, MD 03/14/23 2250    Rondel Baton, MD 03/15/23 1121

## 2023-03-14 NOTE — ED Notes (Signed)
1st lactic 1.45 in normal range, 2nd not needed

## 2023-03-14 NOTE — Discharge Instructions (Signed)
You are seen today for seizure.  While you were here we checked some labs and a CAT scan of your head.  This did not show any abnormality to explain why you had a seizure today.   The adults will have 1 seizure in her lifetime.  This does not necessarily mean you will go on to have additional seizures.  It also does not mean that you have epilepsy, epilepsy requires multiple seizures without a cause identified on labs or imaging.   I am sending you to a neurology clinic.  Please see the phone number attached and call them to make an appointment.  They may order additional tests including an EEG or an MRI of your brain.  If you have repeat seizures between leaving the ED today and seeing your neurologist in clinic, you need to return to the emergency department.   I discussed your case with our on-call neurologist Dr. Jerrell Belfast.  He does not recommend that we start any antiseizure medications today.   Because you had a seizure though, you may not drive for 6 months or until cleared by neurologist.  Do not swim. Do not take a bath alone either as if you have a seizure in this setting you could drown.

## 2023-03-17 ENCOUNTER — Encounter: Payer: Self-pay | Admitting: Neurology

## 2023-04-14 ENCOUNTER — Other Ambulatory Visit (HOSPITAL_COMMUNITY): Payer: Self-pay | Admitting: Psychiatry

## 2023-04-14 MED ORDER — CITALOPRAM HYDROBROMIDE 20 MG PO TABS: 20.0000 mg | ORAL_TABLET | Freq: Every day | ORAL | 2 refills | Status: AC

## 2023-04-16 ENCOUNTER — Ambulatory Visit: Payer: No Typology Code available for payment source | Admitting: Neurology
